# Patient Record
Sex: Male | Born: 1995 | Race: Black or African American | Hispanic: No | Marital: Single | Smoking: Former smoker
Health system: Southern US, Community
[De-identification: ages and names within clinical notes are randomized; demographics above are authoritative.]

## PROBLEM LIST (undated history)

## (undated) DIAGNOSIS — F209 Schizophrenia, unspecified: Secondary | ICD-10-CM

## (undated) DIAGNOSIS — T8859XA Other complications of anesthesia, initial encounter: Secondary | ICD-10-CM

## (undated) DIAGNOSIS — R748 Abnormal levels of other serum enzymes: Secondary | ICD-10-CM

## (undated) HISTORY — DX: Abnormal levels of other serum enzymes: R74.8

---

## 2019-06-03 ENCOUNTER — Telehealth: Payer: Self-pay | Admitting: *Deleted

## 2019-06-03 ENCOUNTER — Encounter: Payer: Self-pay | Admitting: Emergency Medicine

## 2019-06-03 ENCOUNTER — Ambulatory Visit (INDEPENDENT_AMBULATORY_CARE_PROVIDER_SITE_OTHER): Payer: 59 | Admitting: Emergency Medicine

## 2019-06-03 ENCOUNTER — Other Ambulatory Visit: Payer: Self-pay

## 2019-06-03 VITALS — BP 112/80 | HR 97 | Temp 97.5°F | Resp 16 | Ht 68.0 in | Wt 304.0 lb

## 2019-06-03 DIAGNOSIS — Z1321 Encounter for screening for nutritional disorder: Secondary | ICD-10-CM

## 2019-06-03 DIAGNOSIS — Z130 Encounter for screening for diseases of the blood and blood-forming organs and certain disorders involving the immune mechanism: Secondary | ICD-10-CM | POA: Diagnosis not present

## 2019-06-03 DIAGNOSIS — G4700 Insomnia, unspecified: Secondary | ICD-10-CM

## 2019-06-03 DIAGNOSIS — Z8739 Personal history of other diseases of the musculoskeletal system and connective tissue: Secondary | ICD-10-CM | POA: Diagnosis not present

## 2019-06-03 DIAGNOSIS — Z8659 Personal history of other mental and behavioral disorders: Secondary | ICD-10-CM | POA: Diagnosis not present

## 2019-06-03 DIAGNOSIS — Z1329 Encounter for screening for other suspected endocrine disorder: Secondary | ICD-10-CM

## 2019-06-03 DIAGNOSIS — Z13220 Encounter for screening for lipoid disorders: Secondary | ICD-10-CM

## 2019-06-03 DIAGNOSIS — Z13228 Encounter for screening for other metabolic disorders: Secondary | ICD-10-CM

## 2019-06-03 DIAGNOSIS — Z7689 Persons encountering health services in other specified circumstances: Secondary | ICD-10-CM

## 2019-06-03 NOTE — Telephone Encounter (Signed)
Called patient to return to the office for labs, he left without getting his blood drawn.

## 2019-06-03 NOTE — Progress Notes (Signed)
Noah Ramsey 24 y.o.   Chief Complaint  Patient presents with  . Establish Care    per patient he has high CPK levels    HISTORY OF PRESENT ILLNESS: This is a 24 y.o. male first visit to this office, here to establish care with me. Patient has a history of psychosis and takes haloperidol injection 100 mg every 28 days.  Sees psychiatrist on a regular basis. 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 No complaints or medical concerns today.  HPI   Prior to Admission medications   Medication Sig Start Date End Date Taking? Authorizing Provider  haloperidol decanoate (HALDOL DECANOATE) 100 MG/ML injection Inject 100 mg into the muscle every 28 (twenty-eight) days.   Yes [provider]    No Known Allergies  There are no problems to display for this patient.   History reviewed. No pertinent past medical history.  History reviewed. No pertinent surgical history.  Social History   Socioeconomic History  . Marital status: Single    Spouse name: Not on file  . Number of children: Not on file  . Years of education: Not on file  . Highest education level: Not on file  Occupational History  . Not on file  Tobacco Use  . Smoking status: Never Smoker  . Smokeless tobacco: Never Used  Substance and Sexual Activity  . Alcohol use: Never  . Drug use: Never  . Sexual activity: Not on file  Other Topics Concern  . Not on file  Social History Narrative  . Not on file   Social Determinants of Health   Financial Resource Strain:   . Difficulty of Paying Living Expenses: Not on file  Food Insecurity:   . Worried About Charity fundraiser in the Last Year: Not on file  . Ran Out of Food in the Last Year: Not on file  Transportation Needs:   . Lack of Transportation (Medical): Not on file  . Lack of Transportation (Non-Medical): Not on file  Physical Activity:   . Days of Exercise per Week: Not on file    . Minutes of Exercise per Session: Not on file  Stress:   . Feeling of Stress : Not on file  Social Connections:   . Frequency of Communication with Friends and Family: Not on file  . Frequency of Social Gatherings with Friends and Family: Not on file  . Attends Religious Services: Not on file  . Active Member of Clubs or Organizations: Not on file  . Attends Archivist Meetings: Not on file  . Marital Status: Not on file  Intimate Partner Violence:   . Fear of Current or Ex-Partner: Not on file  . Emotionally Abused: Not on file  . Physically Abused: Not on file  . Sexually Abused: Not on file    History reviewed. No pertinent family history.   Review of Systems  Constitutional: Negative.  Negative for chills and fever.  HENT: Negative.  Negative for congestion and sore throat.   Eyes: Negative.   Respiratory: Negative.  Negative for shortness of breath.   Cardiovascular: Negative.  Negative for chest pain and palpitations.  Gastrointestinal: Negative.  Negative for abdominal pain, blood in stool, diarrhea, melena, nausea and vomiting.  Genitourinary: Negative.  Negative for dysuria and hematuria.  Musculoskeletal: Negative.  Negative for back pain, myalgias and neck pain.  Skin: Negative.  Negative for rash.  Neurological: Negative.  Negative for dizziness  and headaches.  Psychiatric/Behavioral: The patient has insomnia.   All other systems reviewed and are negative.   Today's Vitals   06/03/19 1318  BP: 112/80  Pulse: 97  Resp: 16  Temp: (!) 97.5 F (36.4 C)  TempSrc: Temporal  SpO2: 97%  Weight: (!) 304 lb (137.9 kg)  Height: 5\' 8"  (1.727 m)   Body mass index is 46.22 kg/m.  Physical Exam Constitutional:      Appearance: He is obese.  HENT:     Head: Normocephalic.     Nose: Nose normal.     Mouth/Throat:     Mouth: Mucous membranes are moist.     Pharynx: Oropharynx is clear.  Eyes:     Extraocular Movements: Extraocular movements intact.      Pupils: Pupils are equal, round, and reactive to light.  Cardiovascular:     Rate and Rhythm: Normal rate and regular rhythm.     Pulses: Normal pulses.     Heart sounds: Normal heart sounds.  Pulmonary:     Effort: Pulmonary effort is normal.     Breath sounds: Normal breath sounds.  Abdominal:     General: Bowel sounds are normal. There is no distension.     Palpations: Abdomen is soft.     Tenderness: There is no abdominal tenderness.  Musculoskeletal:        General: Normal range of motion.     Cervical back: Normal range of motion and neck supple.  Skin:    General: Skin is warm and dry.     Capillary Refill: Capillary refill takes less than 2 seconds.  Neurological:     General: No focal deficit present.     Mental Status: He is alert and oriented to person, place, and time.  Psychiatric:     Comments: Flat affect      ASSESSMENT & PLAN: Axzel was seen today for establish care.  Diagnoses and all orders for this visit:  Encounter to establish care  History of rhabdomyolysis -     Comprehensive metabolic panel -     CK  History of psychosis  Insomnia, unspecified type  Screening for deficiency anemia -     CBC with Differential/Platelet  Screening for endocrine, nutritional, metabolic and immunity disorder -     Comprehensive metabolic panel -     TSH  Screening for lipoid disorders -     Lipid panel    Patient Instructions       If you have lab work done today you will be contacted with your lab results within the next 2 weeks.  If you have not heard from [facility] then please contact us. The fastest way to get your results is to register for My Chart.   IF you received an x-ray today, you will receive an invoice from [facility]. Please contact [facility] at [phone] with questions or concerns regarding your invoice.   IF you received labwork today, you will receive an invoice from [facility]. Please contact [facility] at  [phone] with questions or concerns regarding your invoice.   Our billing staff will not be able to assist you with questions regarding bills from these companies.  You will be contacted with the lab results as soon as they are available. The fastest way to get your results is to activate your My Chart account. Instructions are located on the last page of this paperwork. If you have not heard from [phone] regarding the results in 2 weeks, please contact  this office.     Health Maintenance, Male Adopting a healthy lifestyle and getting preventive care are important in promoting health and wellness. Ask your health care provider about:  The right schedule for you to have regular tests and exams.  Things you can do on your own to prevent diseases and keep yourself healthy. What should I know about diet, weight, and exercise? Eat a healthy diet   Eat a diet that includes plenty of vegetables, fruits, low-fat dairy products, and lean protein.  Do not eat a lot of foods that are high in solid fats, added sugars, or sodium. Maintain a healthy weight Body mass index (BMI) is a measurement that can be used to identify possible weight problems. It estimates body fat based on height and weight. Your health care provider can help determine your BMI and help you achieve or maintain a healthy weight. Get regular exercise Get regular exercise. This is one of the most important things you can do for your health. Most adults should:  Exercise for at least 150 minutes each week. The exercise should increase your heart rate and make you sweat (moderate-intensity exercise).  Do strengthening exercises at least twice a week. This is in addition to the moderate-intensity exercise.  Spend less time sitting. Even light physical activity can be beneficial. Watch cholesterol and blood lipids Have your blood tested for lipids and cholesterol at 24 years of age, then have this test every 5 years. You may need  to have your cholesterol levels checked more often if:  Your lipid or cholesterol levels are high.  You are older than 24 years of age.  You are at high risk for heart disease. What should I know about cancer screening? Many types of cancers can be detected early and may often be prevented. Depending on your health history and family history, you may need to have cancer screening at various ages. This may include screening for:  Colorectal cancer.  Prostate cancer.  Skin cancer.  Lung cancer. What should I know about heart disease, diabetes, and high blood pressure? Blood pressure and heart disease  High blood pressure causes heart disease and increases the risk of stroke. This is more likely to develop in people who have high blood pressure readings, are of African descent, or are overweight.  Talk with your health care provider about your target blood pressure readings.  Have your blood pressure checked: ? Every 3-5 years if you are 21-56 years of age. ? Every year if you are 14 years old or older.  If you are between the ages of 35 and 4 and are a current or former smoker, ask your health care provider if you should have a one-time screening for abdominal aortic aneurysm (AAA). Diabetes Have regular diabetes screenings. This checks your fasting blood sugar level. Have the screening done:  Once every three years after age 38 if you are at a normal weight and have a low risk for diabetes.  More often and at a younger age if you are overweight or have a high risk for diabetes. What should I know about preventing infection? Hepatitis B If you have a higher risk for hepatitis B, you should be screened for this virus. Talk with your health care provider to find out if you are at risk for hepatitis B infection. Hepatitis C Blood testing is recommended for:  Everyone born from [birth year] through 1965.  Anyone with known risk factors for hepatitis C. Sexually transmitted infections  (STIs)  You should be screened each year for STIs, including gonorrhea and chlamydia, if: ? You are sexually active and are younger than 24 years of age. ? You are older than 24 years of age and your health care provider tells you that you are at risk for this type of infection. ? Your sexual activity has changed since you were last screened, and you are at increased risk for chlamydia or gonorrhea. Ask your health care provider if you are at risk.  Ask your health care provider about whether you are at high risk for HIV. Your health care provider may recommend a prescription medicine to help prevent HIV infection. If you choose to take medicine to prevent HIV, you should first get tested for HIV. You should then be tested every 3 months for as long as you are taking the medicine. Follow these instructions at home: Lifestyle  Do not use any products that contain nicotine or tobacco, such as cigarettes, e-cigarettes, and chewing tobacco. If you need help quitting, ask your health care provider.  Do not use street drugs.  Do not share needles.  Ask your health care provider for help if you need support or information about quitting drugs. Alcohol use  Do not drink alcohol if your health care provider tells you not to drink.  If you drink alcohol: ? Limit how much you have to 0-2 drinks a day. ? Be aware of how much alcohol is in your drink. In the [place] one drink equals one 12 oz bottle of beer (355 mL), one 5 oz glass of wine (148 mL), or one 1 oz glass of hard liquor (44 mL). General instructions  Schedule regular health, dental, and eye exams.  Stay current with your vaccines.  Tell your health care provider if: ? You often feel depressed. ? You have ever been abused or do not feel safe at home. Summary  Adopting a healthy lifestyle and getting preventive care are important in promoting health and wellness.  Follow your health care provider's instructions about healthy diet,  exercising, and getting tested or screened for diseases.  Follow your health care provider's instructions on monitoring your cholesterol and blood pressure. This information is not intended to replace advice given to you by your health care provider. Make sure you discuss any questions you have with your health care provider. Document Revised: 04/01/2018 Document Reviewed: 04/01/2018 Elsevier Patient Education  2020 Elsevier Inc.      Edwina Barth, MD [facility]

## 2019-06-03 NOTE — Patient Instructions (Addendum)
   If you have lab work done today you will be contacted with your lab results within the next 2 weeks.  If you have not heard from us then please contact us. The fastest way to get your results is to register for My Chart.   IF you received an x-ray today, you will receive an invoice from [facility]. Please contact [facility] at [phone] with questions or concerns regarding your invoice.   IF you received labwork today, you will receive an invoice from [facility]. Please contact [facility] at [phone] with questions or concerns regarding your invoice.   Our billing staff will not be able to assist you with questions regarding bills from these companies.  You will be contacted with the lab results as soon as they are available. The fastest way to get your results is to activate your My Chart account. Instructions are located on the last page of this paperwork. If you have not heard from us regarding the results in 2 weeks, please contact this office.      Health Maintenance, Male Adopting a healthy lifestyle and getting preventive care are important in promoting health and wellness. Ask your health care provider about:  The right schedule for you to have regular tests and exams.  Things you can do on your own to prevent diseases and keep yourself healthy. What should I know about diet, weight, and exercise? Eat a healthy diet   Eat a diet that includes plenty of vegetables, fruits, low-fat dairy products, and lean protein.  Do not eat a lot of foods that are high in solid fats, added sugars, or sodium. Maintain a healthy weight Body mass index (BMI) is a measurement that can be used to identify possible weight problems. It estimates body fat based on height and weight. Your health care provider can help determine your BMI and help you achieve or maintain a healthy weight. Get regular exercise Get regular exercise. This is one of the most important things you  can do for your health. Most adults should:  Exercise for at least 150 minutes each week. The exercise should increase your heart rate and make you sweat (moderate-intensity exercise).  Do strengthening exercises at least twice a week. This is in addition to the moderate-intensity exercise.  Spend less time sitting. Even light physical activity can be beneficial. Watch cholesterol and blood lipids Have your blood tested for lipids and cholesterol at 24 years of age, then have this test every 5 years. You may need to have your cholesterol levels checked more often if:  Your lipid or cholesterol levels are high.  You are older than 24 years of age.  You are at high risk for heart disease. What should I know about cancer screening? Many types of cancers can be detected early and may often be prevented. Depending on your health history and family history, you may need to have cancer screening at various ages. This may include screening for:  Colorectal cancer.  Prostate cancer.  Skin cancer.  Lung cancer. What should I know about heart disease, diabetes, and high blood pressure? Blood pressure and heart disease  High blood pressure causes heart disease and increases the risk of stroke. This is more likely to develop in people who have high blood pressure readings, are of African descent, or are overweight.  Talk with your health care provider about your target blood pressure readings.  Have your blood pressure checked: ? Every 3-5 years if you are 18-39   years of age. ? Every year if you are 40 years old or older.  If you are between the ages of 65 and 75 and are a current or former smoker, ask your health care provider if you should have a one-time screening for abdominal aortic aneurysm (AAA). Diabetes Have regular diabetes screenings. This checks your fasting blood sugar level. Have the screening done:  Once every three years after age 45 if you are at a normal weight and have  a low risk for diabetes.  More often and at a younger age if you are overweight or have a high risk for diabetes. What should I know about preventing infection? Hepatitis B If you have a higher risk for hepatitis B, you should be screened for this virus. Talk with your health care provider to find out if you are at risk for hepatitis B infection. Hepatitis C Blood testing is recommended for:  Everyone born from 1945 through 1965.  Anyone with known risk factors for hepatitis C. Sexually transmitted infections (STIs)  You should be screened each year for STIs, including gonorrhea and chlamydia, if: ? You are sexually active and are younger than 24 years of age. ? You are older than 24 years of age and your health care provider tells you that you are at risk for this type of infection. ? Your sexual activity has changed since you were last screened, and you are at increased risk for chlamydia or gonorrhea. Ask your health care provider if you are at risk.  Ask your health care provider about whether you are at high risk for HIV. Your health care provider may recommend a prescription medicine to help prevent HIV infection. If you choose to take medicine to prevent HIV, you should first get tested for HIV. You should then be tested every 3 months for as long as you are taking the medicine. Follow these instructions at home: Lifestyle  Do not use any products that contain nicotine or tobacco, such as cigarettes, e-cigarettes, and chewing tobacco. If you need help quitting, ask your health care provider.  Do not use street drugs.  Do not share needles.  Ask your health care provider for help if you need support or information about quitting drugs. Alcohol use  Do not drink alcohol if your health care provider tells you not to drink.  If you drink alcohol: ? Limit how much you have to 0-2 drinks a day. ? Be aware of how much alcohol is in your drink. In the [place] one drink equals one 12  oz bottle of beer (355 mL), one 5 oz glass of wine (148 mL), or one 1 oz glass of hard liquor (44 mL). General instructions  Schedule regular health, dental, and eye exams.  Stay current with your vaccines.  Tell your health care provider if: ? You often feel depressed. ? You have ever been abused or do not feel safe at home. Summary  Adopting a healthy lifestyle and getting preventive care are important in promoting health and wellness.  Follow your health care provider's instructions about healthy diet, exercising, and getting tested or screened for diseases.  Follow your health care provider's instructions on monitoring your cholesterol and blood pressure. This information is not intended to replace advice given to you by your health care provider. Make sure you discuss any questions you have with your health care provider. Document Revised: 04/01/2018 Document Reviewed: 04/01/2018 Elsevier Patient Education  2020 Elsevier Inc.  

## 2019-06-04 LAB — COMPREHENSIVE METABOLIC PANEL WITH GFR
ALT: 30 [IU]/L (ref 0–44)
AST: 30 [IU]/L (ref 0–40)
Albumin/Globulin Ratio: 1.3 (ref 1.2–2.2)
Albumin: 4.3 g/dL (ref 4.1–5.2)
Alkaline Phosphatase: 69 [IU]/L (ref 39–117)
BUN/Creatinine Ratio: 9 (ref 9–20)
BUN: 9 mg/dL (ref 6–20)
Bilirubin Total: 0.5 mg/dL (ref 0.0–1.2)
CO2: 21 mmol/L (ref 20–29)
Calcium: 9.4 mg/dL (ref 8.7–10.2)
Chloride: 101 mmol/L (ref 96–106)
Creatinine, Ser: 1.02 mg/dL (ref 0.76–1.27)
GFR calc Af Amer: 119 mL/min/{1.73_m2}
GFR calc non Af Amer: 103 mL/min/{1.73_m2}
Globulin, Total: 3.4 g/dL (ref 1.5–4.5)
Glucose: 81 mg/dL (ref 65–99)
Potassium: 4.1 mmol/L (ref 3.5–5.2)
Sodium: 141 mmol/L (ref 134–144)
Total Protein: 7.7 g/dL (ref 6.0–8.5)

## 2019-06-04 LAB — CBC WITH DIFFERENTIAL/PLATELET
Basophils Absolute: 0 10*3/uL (ref 0.0–0.2)
Basos: 0 %
EOS (ABSOLUTE): 0.1 10*3/uL (ref 0.0–0.4)
Eos: 2 %
Hematocrit: 41.9 % (ref 37.5–51.0)
Hemoglobin: 14.6 g/dL (ref 13.0–17.7)
Immature Grans (Abs): 0 10*3/uL (ref 0.0–0.1)
Immature Granulocytes: 0 %
Lymphocytes Absolute: 2.5 10*3/uL (ref 0.7–3.1)
Lymphs: 55 %
MCH: 32.1 pg (ref 26.6–33.0)
MCHC: 34.8 g/dL (ref 31.5–35.7)
MCV: 92 fL (ref 79–97)
Monocytes Absolute: 0.4 10*3/uL (ref 0.1–0.9)
Monocytes: 9 %
Neutrophils Absolute: 1.5 10*3/uL (ref 1.4–7.0)
Neutrophils: 34 %
Platelets: 197 10*3/uL (ref 150–450)
RBC: 4.55 x10E6/uL (ref 4.14–5.80)
RDW: 13.5 % (ref 11.6–15.4)
WBC: 4.6 10*3/uL (ref 3.4–10.8)

## 2019-06-04 LAB — LIPID PANEL
Chol/HDL Ratio: 3.9 ratio (ref 0.0–5.0)
Cholesterol, Total: 159 mg/dL (ref 100–199)
HDL: 41 mg/dL (ref >39)
LDL Chol Calc (NIH): 101 mg/dL — ABNORMAL HIGH (ref 0–99)
Triglycerides: 90 mg/dL (ref 0–149)
VLDL Cholesterol Cal: 17 mg/dL (ref 5–40)

## 2019-06-04 LAB — TSH: TSH: 4.47 u[IU]/mL (ref 0.450–4.500)

## 2019-06-04 LAB — CK: Total CK: 676 U/L (ref 49–439)

## 2020-01-18 ENCOUNTER — Ambulatory Visit: Payer: 59 | Admitting: Emergency Medicine

## 2020-01-18 ENCOUNTER — Encounter: Payer: Self-pay | Admitting: Emergency Medicine

## 2020-01-18 ENCOUNTER — Other Ambulatory Visit: Payer: Self-pay

## 2020-01-18 VITALS — BP 113/76 | HR 97 | Temp 97.6°F | Resp 16 | Ht 67.0 in | Wt 316.0 lb

## 2020-01-18 DIAGNOSIS — Z8739 Personal history of other diseases of the musculoskeletal system and connective tissue: Secondary | ICD-10-CM | POA: Diagnosis not present

## 2020-01-18 DIAGNOSIS — R635 Abnormal weight gain: Secondary | ICD-10-CM | POA: Diagnosis not present

## 2020-01-18 DIAGNOSIS — Z6841 Body Mass Index (BMI) 40.0 and over, adult: Secondary | ICD-10-CM | POA: Insufficient documentation

## 2020-01-18 DIAGNOSIS — Z8659 Personal history of other mental and behavioral disorders: Secondary | ICD-10-CM

## 2020-01-18 MED ORDER — METFORMIN HCL 500 MG PO TABS: 500.0000 mg | ORAL_TABLET | Freq: Two times a day (BID) | ORAL | 3 refills | Status: DC

## 2020-01-18 NOTE — Addendum Note (Signed)
Addended by: Evie Lacks on: 01/18/2020 05:03 PM   Modules accepted: Orders

## 2020-01-18 NOTE — Progress Notes (Signed)
Noah Ramsey 24 y.o.   Chief Complaint  Patient presents with  . CPK level    patient wants to check CPK and chronic medical conditions    HISTORY OF PRESENT ILLNESS: This is a 24 y.o. male here for follow-up on his chronic medical problems. 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 Has history of rhabdomyolysis with increased CPK.  No history of chronic renal failure. Concerned about his weight.  Inquiring about medication to lose weight.  Needs referral to nutritional services. No other complaints or medical concerns today.  HPI   Prior to Admission medications   Medication Sig Start Date End Date Taking? Authorizing Provider  haloperidol (HALDOL) 10 MG tablet Take 10 mg by mouth at bedtime.   Yes [provider]  haloperidol decanoate (HALDOL DECANOATE) 100 MG/ML injection Inject 100 mg into the muscle every 28 (twenty-eight) days. Patient not taking: Reported on 01/18/2020    [provider]    No Known Allergies  There are no problems to display for this patient.   Past Medical History:  Diagnosis Date  . Elevated CPK    per patient    No past surgical history on file.  Social History   Socioeconomic History  . Marital status: Single    Spouse name: Not on file  . Number of children: Not on file  . Years of education: Not on file  . Highest education level: Not on file  Occupational History  . Not on file  Tobacco Use  . Smoking status: Never Smoker  . Smokeless tobacco: Never Used  Substance and Sexual Activity  . Alcohol use: Never  . Drug use: Never  . Sexual activity: Not on file  Other Topics Concern  . Not on file  Social History Narrative  . Not on file   Social Determinants of Health   Financial Resource Strain:   . Difficulty of Paying Living Expenses: Not on file  Food Insecurity:   . Worried About Programme researcher, broadcasting/film/videounning Out of Food in the Last Year: Not on file  . Ran Out of Food in the Last Year: Not on file    Transportation Needs:   . Lack of Transportation (Medical): Not on file  . Lack of Transportation (Non-Medical): Not on file  Physical Activity:   . Days of Exercise per Week: Not on file  . Minutes of Exercise per Session: Not on file  Stress:   . Feeling of Stress : Not on file  Social Connections:   . Frequency of Communication with Friends and Family: Not on file  . Frequency of Social Gatherings with Friends and Family: Not on file  . Attends Religious Services: Not on file  . Active Member of Clubs or Organizations: Not on file  . Attends BankerClub or Organization Meetings: Not on file  . Marital Status: Not on file  Intimate Partner Violence:   . Fear of Current or Ex-Partner: Not on file  . Emotionally Abused: Not on file  . Physically Abused: Not on file  . Sexually Abused: Not on file    Family History  Problem Relation Age of Onset  . Mental illness Brother      Review of Systems  Constitutional: Negative.  Negative for chills and fever.  HENT: Negative.  Negative for congestion and sore throat.   Respiratory: Negative.  Negative for cough and shortness of breath.   Cardiovascular: Negative.  Negative for chest pain and palpitations.  Gastrointestinal: Negative for abdominal  pain, nausea and vomiting.  Genitourinary: Negative.  Negative for dysuria.  Skin: Negative.  Negative for rash.  Neurological: Negative.  Negative for dizziness and headaches.  All other systems reviewed and are negative.  Today's Vitals   01/18/20 1622  BP: 113/76  Pulse: 97  Resp: 16  Temp: 97.6 F (36.4 C)  TempSrc: Temporal  SpO2: 96%  Weight: (!) 316 lb (143.3 kg)  Height: 5\' 7"  (1.702 m)   Body mass index is 49.49 kg/m.   Physical Exam Vitals reviewed.  Constitutional:      Appearance: He is obese.  HENT:     Head: Normocephalic.  Eyes:     Extraocular Movements: Extraocular movements intact.     Pupils: Pupils are equal, round, and reactive to light.  Cardiovascular:      Rate and Rhythm: Normal rate and regular rhythm.     Pulses: Normal pulses.     Heart sounds: Normal heart sounds.  Pulmonary:     Effort: Pulmonary effort is normal.     Breath sounds: Normal breath sounds.  Musculoskeletal:        General: Normal range of motion.     Cervical back: Normal range of motion and neck supple.  Skin:    General: Skin is warm and dry.  Neurological:     General: No focal deficit present.     Mental Status: He is alert and oriented to person, place, and time.  Psychiatric:        Mood and Affect: Mood normal.        Behavior: Behavior normal.      ASSESSMENT & PLAN: Noah Ramsey was seen today for cpk level.  Diagnoses and all orders for this visit:  Body mass index (BMI) of 45.0-49.9 in adult (HCC) -     Hemoglobin A1c; Future -     Amb ref to Medical Nutrition Therapy-MNT -     metFORMIN (GLUCOPHAGE) 500 MG tablet; Take 1 tablet (500 mg total) by mouth 2 (two) times daily with a meal.  History of rhabdomyolysis -     Comprehensive metabolic panel -     CK  History of psychosis    Patient Instructions       If you have lab work done today you will be contacted with your lab results within the next 2 weeks.  If you have not heard from [id number] then please contact us. The fastest way to get your results is to register for My Chart.   IF you received an x-ray today, you will receive an invoice from [facility]. Please contact [facility] at [phone] with questions or concerns regarding your invoice.   IF you received labwork today, you will receive an invoice from [facility]. Please contact [facility] at [phone] with questions or concerns regarding your invoice.   Our billing staff will not be able to assist you with questions regarding bills from these companies.  You will be contacted with the lab results as soon as they are available. The fastest way to get your results is to activate your My Chart account.  Instructions are located on the last page of this paperwork. If you have not heard from [phone] regarding the results in 2 weeks, please contact this office.     Calorie Counting for Weight Loss Calories are units of energy. Your body needs a certain amount of calories from food to keep you going throughout the day. When you eat more calories than your body needs, your  body stores the extra calories as fat. When you eat fewer calories than your body needs, your body burns fat to get the energy it needs. Calorie counting means keeping track of how many calories you eat and drink each day. Calorie counting can be helpful if you need to lose weight. If you make sure to eat fewer calories than your body needs, you should lose weight. Ask your health care provider what a healthy weight is for you. For calorie counting to work, you will need to eat the right number of calories in a day in order to lose a healthy amount of weight per week. A dietitian can help you determine how many calories you need in a day and will give you suggestions on how to reach your calorie goal.  A healthy amount of weight to lose per week is usually 1-2 lb (0.5-0.9 kg). This usually means that your daily calorie intake should be reduced by 500-750 calories.  Eating 1,200 - 1,500 calories per day can help most women lose weight.  Eating 1,500 - 1,800 calories per day can help most men lose weight. What is my plan? My goal is to have __________ calories per day. If I have this many calories per day, I should lose around __________ pounds per week. What do I need to know about calorie counting? In order to meet your daily calorie goal, you will need to:  Find out how many calories are in each food you would like to eat. Try to do this before you eat.  Decide how much of the food you plan to eat.  Write down what you ate and how many calories it had. Doing this is called keeping a food log. To successfully lose weight, it is  important to balance calorie counting with a healthy lifestyle that includes regular activity. Aim for 150 minutes of moderate exercise (such as walking) or 75 minutes of vigorous exercise (such as running) each week. Where do I find calorie information?  The number of calories in a food can be found on a Nutrition Facts label. If a food does not have a Nutrition Facts label, try to look up the calories online or ask your dietitian for help. Remember that calories are listed per serving. If you choose to have more than one serving of a food, you will have to multiply the calories per serving by the amount of servings you plan to eat. For example, the label on a package of bread might say that a serving size is 1 slice and that there are 90 calories in a serving. If you eat 1 slice, you will have eaten 90 calories. If you eat 2 slices, you will have eaten 180 calories. How do I keep a food log? Immediately after each meal, record the following information in your food log:  What you ate. Don't forget to include toppings, sauces, and other extras on the food.  How much you ate. This can be measured in cups, ounces, or number of items.  How many calories each food and drink had.  The total number of calories in the meal. Keep your food log near you, such as in a small notebook in your pocket, or use a mobile app or website. Some programs will calculate calories for you and show you how many calories you have left for the day to meet your goal. What are some calorie counting tips?   Use your calories on foods and drinks that will fill  you up and not leave you hungry: ? Some examples of foods that fill you up are nuts and nut butters, vegetables, lean proteins, and high-fiber foods like whole grains. High-fiber foods are foods with more than 5 g fiber per serving. ? Drinks such as sodas, specialty coffee drinks, alcohol, and juices have a lot of calories, yet do not fill you up.  Eat nutritious  foods and avoid empty calories. Empty calories are calories you get from foods or beverages that do not have many vitamins or protein, such as candy, sweets, and soda. It is better to have a nutritious high-calorie food (such as an avocado) than a food with few nutrients (such as a bag of chips).  Know how many calories are in the foods you eat most often. This will help you calculate calorie counts faster.  Pay attention to calories in drinks. Low-calorie drinks include water and unsweetened drinks.  Pay attention to nutrition labels for "low fat" or "fat free" foods. These foods sometimes have the same amount of calories or more calories than the full fat versions. They also often have added sugar, starch, or salt, to make up for flavor that was removed with the fat.  Find a way of tracking calories that works for you. Get creative. Try different apps or programs if writing down calories does not work for you. What are some portion control tips?  Know how many calories are in a serving. This will help you know how many servings of a certain food you can have.  Use a measuring cup to measure serving sizes. You could also try weighing out portions on a kitchen scale. With time, you will be able to estimate serving sizes for some foods.  Take some time to put servings of different foods on your favorite plates, bowls, and cups so you know what a serving looks like.  Try not to eat straight from a bag or box. Doing this can lead to overeating. Put the amount you would like to eat in a cup or on a plate to make sure you are eating the right portion.  Use smaller plates, glasses, and bowls to prevent overeating.  Try not to multitask (for example, watch TV or use your computer) while eating. If it is time to eat, sit down at a table and enjoy your food. This will help you to know when you are full. It will also help you to be aware of what you are eating and how much you are eating. What are tips  for following this plan? Reading food labels  Check the calorie count compared to the serving size. The serving size may be smaller than what you are used to eating.  Check the source of the calories. Make sure the food you are eating is high in vitamins and protein and low in saturated and trans fats. Shopping  Read nutrition labels while you shop. This will help you make healthy decisions before you decide to purchase your food.  Make a grocery list and stick to it. Cooking  Try to cook your favorite foods in a healthier way. For example, try baking instead of frying.  Use low-fat dairy products. Meal planning  Use more fruits and vegetables. Half of your plate should be fruits and vegetables.  Include lean proteins like poultry and fish. How do I count calories when eating out?  Ask for smaller portion sizes.  Consider sharing an entree and sides instead of getting your own entree.  If you get your own entree, eat only half. Ask for a box at the beginning of your meal and put the rest of your entree in it so you are not tempted to eat it.  If calories are listed on the menu, choose the lower calorie options.  Choose dishes that include vegetables, fruits, whole grains, low-fat dairy products, and lean protein.  Choose items that are boiled, broiled, grilled, or steamed. Stay away from items that are buttered, battered, fried, or served with cream sauce. Items labeled "crispy" are usually fried, unless stated otherwise.  Choose water, low-fat milk, unsweetened iced tea, or other drinks without added sugar. If you want an alcoholic beverage, choose a lower calorie option such as a glass of wine or light beer.  Ask for dressings, sauces, and syrups on the side. These are usually high in calories, so you should limit the amount you eat.  If you want a salad, choose a garden salad and ask for grilled meats. Avoid extra toppings like bacon, cheese, or fried items. Ask for the  dressing on the side, or ask for olive oil and vinegar or lemon to use as dressing.  Estimate how many servings of a food you are given. For example, a serving of cooked rice is  cup or about the size of half a baseball. Knowing serving sizes will help you be aware of how much food you are eating at restaurants. The list below tells you how big or small some common portion sizes are based on everyday objects: ? 1 oz--4 stacked dice. ? 3 oz--1 deck of cards. ? 1 tsp--1 die. ? 1 Tbsp-- a ping-pong ball. ? 2 Tbsp--1 ping-pong ball. ?  cup-- baseball. ? 1 cup--1 baseball. Summary  Calorie counting means keeping track of how many calories you eat and drink each day. If you eat fewer calories than your body needs, you should lose weight.  A healthy amount of weight to lose per week is usually 1-2 lb (0.5-0.9 kg). This usually means reducing your daily calorie intake by 500-750 calories.  The number of calories in a food can be found on a Nutrition Facts label. If a food does not have a Nutrition Facts label, try to look up the calories online or ask your dietitian for help.  Use your calories on foods and drinks that will fill you up, and not on foods and drinks that will leave you hungry.  Use smaller plates, glasses, and bowls to prevent overeating. This information is not intended to replace advice given to you by your health care provider. Make sure you discuss any questions you have with your health care provider. Document Revised: 12/26/2017 Document Reviewed: 03/08/2016 Elsevier Patient Education  2020 [employer]      Edwina Barth, MD [facility]

## 2020-01-18 NOTE — Patient Instructions (Addendum)
   If you have lab work done today you will be contacted with your lab results within the next 2 weeks.  If you have not heard from us then please contact us. The fastest way to get your results is to register for My Chart.   IF you received an x-ray today, you will receive an invoice from [facility]. Please contact [facility] at [phone] with questions or concerns regarding your invoice.   IF you received labwork today, you will receive an invoice from [facility]. Please contact [facility] at [phone] with questions or concerns regarding your invoice.   Our billing staff will not be able to assist you with questions regarding bills from these companies.  You will be contacted with the lab results as soon as they are available. The fastest way to get your results is to activate your My Chart account. Instructions are located on the last page of this paperwork. If you have not heard from us regarding the results in 2 weeks, please contact this office.      Calorie Counting for Weight Loss Calories are units of energy. Your body needs a certain amount of calories from food to keep you going throughout the day. When you eat more calories than your body needs, your body stores the extra calories as fat. When you eat fewer calories than your body needs, your body burns fat to get the energy it needs. Calorie counting means keeping track of how many calories you eat and drink each day. Calorie counting can be helpful if you need to lose weight. If you make sure to eat fewer calories than your body needs, you should lose weight. Ask your health care provider what a healthy weight is for you. For calorie counting to work, you will need to eat the right number of calories in a day in order to lose a healthy amount of weight per week. A dietitian can help you determine how many calories you need in a day and will give you suggestions on how to reach your calorie goal.  A healthy  amount of weight to lose per week is usually 1-2 lb (0.5-0.9 kg). This usually means that your daily calorie intake should be reduced by 500-750 calories.  Eating 1,200 - 1,500 calories per day can help most women lose weight.  Eating 1,500 - 1,800 calories per day can help most men lose weight. What is my plan? My goal is to have __________ calories per day. If I have this many calories per day, I should lose around __________ pounds per week. What do I need to know about calorie counting? In order to meet your daily calorie goal, you will need to:  Find out how many calories are in each food you would like to eat. Try to do this before you eat.  Decide how much of the food you plan to eat.  Write down what you ate and how many calories it had. Doing this is called keeping a food log. To successfully lose weight, it is important to balance calorie counting with a healthy lifestyle that includes regular activity. Aim for 150 minutes of moderate exercise (such as walking) or 75 minutes of vigorous exercise (such as running) each week. Where do I find calorie information?  The number of calories in a food can be found on a Nutrition Facts label. If a food does not have a Nutrition Facts label, try to look up the calories online or ask your dietitian   for help. Remember that calories are listed per serving. If you choose to have more than one serving of a food, you will have to multiply the calories per serving by the amount of servings you plan to eat. For example, the label on a package of bread might say that a serving size is 1 slice and that there are 90 calories in a serving. If you eat 1 slice, you will have eaten 90 calories. If you eat 2 slices, you will have eaten 180 calories. How do I keep a food log? Immediately after each meal, record the following information in your food log:  What you ate. Don't forget to include toppings, sauces, and other extras on the food.  How much you  ate. This can be measured in cups, ounces, or number of items.  How many calories each food and drink had.  The total number of calories in the meal. Keep your food log near you, such as in a small notebook in your pocket, or use a mobile app or website. Some programs will calculate calories for you and show you how many calories you have left for the day to meet your goal. What are some calorie counting tips?   Use your calories on foods and drinks that will fill you up and not leave you hungry: ? Some examples of foods that fill you up are nuts and nut butters, vegetables, lean proteins, and high-fiber foods like whole grains. High-fiber foods are foods with more than 5 g fiber per serving. ? Drinks such as sodas, specialty coffee drinks, alcohol, and juices have a lot of calories, yet do not fill you up.  Eat nutritious foods and avoid empty calories. Empty calories are calories you get from foods or beverages that do not have many vitamins or protein, such as candy, sweets, and soda. It is better to have a nutritious high-calorie food (such as an avocado) than a food with few nutrients (such as a bag of chips).  Know how many calories are in the foods you eat most often. This will help you calculate calorie counts faster.  Pay attention to calories in drinks. Low-calorie drinks include water and unsweetened drinks.  Pay attention to nutrition labels for "low fat" or "fat free" foods. These foods sometimes have the same amount of calories or more calories than the full fat versions. They also often have added sugar, starch, or salt, to make up for flavor that was removed with the fat.  Find a way of tracking calories that works for you. Get creative. Try different apps or programs if writing down calories does not work for you. What are some portion control tips?  Know how many calories are in a serving. This will help you know how many servings of a certain food you can have.  Use a  measuring cup to measure serving sizes. You could also try weighing out portions on a kitchen scale. With time, you will be able to estimate serving sizes for some foods.  Take some time to put servings of different foods on your favorite plates, bowls, and cups so you know what a serving looks like.  Try not to eat straight from a bag or box. Doing this can lead to overeating. Put the amount you would like to eat in a cup or on a plate to make sure you are eating the right portion.  Use smaller plates, glasses, and bowls to prevent overeating.  Try not to multitask (for   example, watch TV or use your computer) while eating. If it is time to eat, sit down at a table and enjoy your food. This will help you to know when you are full. It will also help you to be aware of what you are eating and how much you are eating. What are tips for following this plan? Reading food labels  Check the calorie count compared to the serving size. The serving size may be smaller than what you are used to eating.  Check the source of the calories. Make sure the food you are eating is high in vitamins and protein and low in saturated and trans fats. Shopping  Read nutrition labels while you shop. This will help you make healthy decisions before you decide to purchase your food.  Make a grocery list and stick to it. Cooking  Try to cook your favorite foods in a healthier way. For example, try baking instead of frying.  Use low-fat dairy products. Meal planning  Use more fruits and vegetables. Half of your plate should be fruits and vegetables.  Include lean proteins like poultry and fish. How do I count calories when eating out?  Ask for smaller portion sizes.  Consider sharing an entree and sides instead of getting your own entree.  If you get your own entree, eat only half. Ask for a box at the beginning of your meal and put the rest of your entree in it so you are not tempted to eat it.  If calories  are listed on the menu, choose the lower calorie options.  Choose dishes that include vegetables, fruits, whole grains, low-fat dairy products, and lean protein.  Choose items that are boiled, broiled, grilled, or steamed. Stay away from items that are buttered, battered, fried, or served with cream sauce. Items labeled "crispy" are usually fried, unless stated otherwise.  Choose water, low-fat milk, unsweetened iced tea, or other drinks without added sugar. If you want an alcoholic beverage, choose a lower calorie option such as a glass of wine or light beer.  Ask for dressings, sauces, and syrups on the side. These are usually high in calories, so you should limit the amount you eat.  If you want a salad, choose a garden salad and ask for grilled meats. Avoid extra toppings like bacon, cheese, or fried items. Ask for the dressing on the side, or ask for olive oil and vinegar or lemon to use as dressing.  Estimate how many servings of a food you are given. For example, a serving of cooked rice is  cup or about the size of half a baseball. Knowing serving sizes will help you be aware of how much food you are eating at restaurants. The list below tells you how big or small some common portion sizes are based on everyday objects: ? 1 oz--4 stacked dice. ? 3 oz--1 deck of cards. ? 1 tsp--1 die. ? 1 Tbsp-- a ping-pong ball. ? 2 Tbsp--1 ping-pong ball. ?  cup-- baseball. ? 1 cup--1 baseball. Summary  Calorie counting means keeping track of how many calories you eat and drink each day. If you eat fewer calories than your body needs, you should lose weight.  A healthy amount of weight to lose per week is usually 1-2 lb (0.5-0.9 kg). This usually means reducing your daily calorie intake by 500-750 calories.  The number of calories in a food can be found on a Nutrition Facts label. If a food does not have a Nutrition   Facts label, try to look up the calories online or ask your dietitian for  help.  Use your calories on foods and drinks that will fill you up, and not on foods and drinks that will leave you hungry.  Use smaller plates, glasses, and bowls to prevent overeating. This information is not intended to replace advice given to you by your health care provider. Make sure you discuss any questions you have with your health care provider. Document Revised: 12/26/2017 Document Reviewed: 03/08/2016 Elsevier Patient Education  2020 [employer]  

## 2020-01-19 ENCOUNTER — Other Ambulatory Visit: Payer: Self-pay | Admitting: Emergency Medicine

## 2020-01-19 DIAGNOSIS — Z8739 Personal history of other diseases of the musculoskeletal system and connective tissue: Secondary | ICD-10-CM

## 2020-01-19 LAB — COMPREHENSIVE METABOLIC PANEL
ALT: 40 IU/L (ref 0–44)
AST: 48 IU/L — ABNORMAL HIGH (ref 0–40)
Albumin/Globulin Ratio: 1.1 — ABNORMAL LOW (ref 1.2–2.2)
Albumin: 4.2 g/dL (ref 4.1–5.2)
Alkaline Phosphatase: 68 IU/L (ref 44–121)
BUN/Creatinine Ratio: 11 (ref 9–20)
BUN: 10 mg/dL (ref 6–20)
Bilirubin Total: 0.6 mg/dL (ref 0.0–1.2)
CO2: 21 mmol/L (ref 20–29)
Calcium: 9.6 mg/dL (ref 8.7–10.2)
Chloride: 101 mmol/L (ref 96–106)
Creatinine, Ser: 0.93 mg/dL (ref 0.76–1.27)
GFR calc Af Amer: 132 mL/min/{1.73_m2} (ref >59)
GFR calc non Af Amer: 114 mL/min/{1.73_m2} (ref >59)
Globulin, Total: 3.7 g/dL (ref 1.5–4.5)
Glucose: 98 mg/dL (ref 65–99)
Potassium: 4 mmol/L (ref 3.5–5.2)
Sodium: 140 mmol/L (ref 134–144)
Total Protein: 7.9 g/dL (ref 6.0–8.5)

## 2020-01-19 LAB — HEMOGLOBIN A1C
Est. average glucose Bld gHb Est-mCnc: 120 mg/dL
Hgb A1c MFr Bld: 5.8 % — ABNORMAL HIGH (ref 4.8–5.6)

## 2020-01-19 LAB — CK: Total CK: 1519 U/L (ref 49–439)

## 2020-01-24 ENCOUNTER — Encounter: Payer: Self-pay | Admitting: Radiology

## 2020-07-17 ENCOUNTER — Ambulatory Visit: Payer: 59 | Admitting: Emergency Medicine

## 2020-10-03 DIAGNOSIS — F200 Paranoid schizophrenia: Secondary | ICD-10-CM | POA: Diagnosis not present

## 2020-10-09 DIAGNOSIS — F200 Paranoid schizophrenia: Secondary | ICD-10-CM | POA: Diagnosis not present

## 2020-10-24 DIAGNOSIS — F200 Paranoid schizophrenia: Secondary | ICD-10-CM | POA: Diagnosis not present

## 2020-11-07 ENCOUNTER — Ambulatory Visit (HOSPITAL_COMMUNITY)
Admission: EM | Admit: 2020-11-07 | Discharge: 2020-11-07 | Disposition: A | Discharge status: Other Acute Inpatient Hospital | Payer: 59 | Attending: Registered Nurse | Admitting: Registered Nurse

## 2020-11-07 ENCOUNTER — Emergency Department (HOSPITAL_COMMUNITY)
Admission: EM | Admit: 2020-11-07 | Discharge: 2020-11-10 | Disposition: A | Discharge status: Other Acute Inpatient Hospital | Payer: 59 | Attending: Emergency Medicine | Admitting: Emergency Medicine

## 2020-11-07 ENCOUNTER — Encounter (HOSPITAL_COMMUNITY): Payer: Self-pay | Admitting: Registered Nurse

## 2020-11-07 ENCOUNTER — Other Ambulatory Visit: Payer: Self-pay

## 2020-11-07 DIAGNOSIS — F202 Catatonic schizophrenia: Secondary | ICD-10-CM | POA: Diagnosis present

## 2020-11-07 DIAGNOSIS — F209 Schizophrenia, unspecified: Secondary | ICD-10-CM | POA: Diagnosis not present

## 2020-11-07 DIAGNOSIS — N179 Acute kidney failure, unspecified: Secondary | ICD-10-CM | POA: Diagnosis not present

## 2020-11-07 DIAGNOSIS — Z6841 Body Mass Index (BMI) 40.0 and over, adult: Secondary | ICD-10-CM | POA: Diagnosis not present

## 2020-11-07 DIAGNOSIS — R55 Syncope and collapse: Secondary | ICD-10-CM | POA: Diagnosis present

## 2020-11-07 DIAGNOSIS — Y900 Blood alcohol level of less than 20 mg/100 ml: Secondary | ICD-10-CM | POA: Insufficient documentation

## 2020-11-07 DIAGNOSIS — M6282 Rhabdomyolysis: Secondary | ICD-10-CM | POA: Diagnosis present

## 2020-11-07 DIAGNOSIS — Z79899 Other long term (current) drug therapy: Secondary | ICD-10-CM | POA: Insufficient documentation

## 2020-11-07 DIAGNOSIS — Z20822 Contact with and (suspected) exposure to covid-19: Secondary | ICD-10-CM | POA: Insufficient documentation

## 2020-11-07 DIAGNOSIS — E860 Dehydration: Secondary | ICD-10-CM

## 2020-11-07 HISTORY — DX: Schizophrenia, unspecified: F20.9

## 2020-11-07 LAB — CBC WITH DIFFERENTIAL/PLATELET
Abs Immature Granulocytes: 0.04 10*3/uL (ref 0.00–0.07)
Basophils Absolute: 0 10*3/uL (ref 0.0–0.1)
Basophils Relative: 0 %
Eosinophils Absolute: 0 10*3/uL (ref 0.0–0.5)
Eosinophils Relative: 0 %
HCT: 43.2 % (ref 39.0–52.0)
Hemoglobin: 14.6 g/dL (ref 13.0–17.0)
Immature Granulocytes: 0 %
Lymphocytes Relative: 11 %
Lymphs Abs: 1.3 10*3/uL (ref 0.7–4.0)
MCH: 31.3 pg (ref 26.0–34.0)
MCHC: 33.8 g/dL (ref 30.0–36.0)
MCV: 92.7 fL (ref 80.0–100.0)
Monocytes Absolute: 1.1 10*3/uL — ABNORMAL HIGH (ref 0.1–1.0)
Monocytes Relative: 9 %
Neutro Abs: 9.6 10*3/uL — ABNORMAL HIGH (ref 1.7–7.7)
Neutrophils Relative %: 80 %
Platelets: 254 10*3/uL (ref 150–400)
RBC: 4.66 MIL/uL (ref 4.22–5.81)
RDW: 14 % (ref 11.5–15.5)
WBC: 11.9 10*3/uL — ABNORMAL HIGH (ref 4.0–10.5)
nRBC: 0 % (ref 0.0–0.2)

## 2020-11-07 LAB — COMPREHENSIVE METABOLIC PANEL
ALT: 77 U/L — ABNORMAL HIGH (ref 0–44)
AST: 181 U/L — ABNORMAL HIGH (ref 15–41)
Albumin: 4.7 g/dL (ref 3.5–5.0)
Alkaline Phosphatase: 66 U/L (ref 38–126)
Anion gap: 18 — ABNORMAL HIGH (ref 5–15)
BUN: 20 mg/dL (ref 6–20)
CO2: 18 mmol/L — ABNORMAL LOW (ref 22–32)
Calcium: 10 mg/dL (ref 8.9–10.3)
Chloride: 106 mmol/L (ref 98–111)
Creatinine, Ser: 2.65 mg/dL — ABNORMAL HIGH (ref 0.61–1.24)
GFR, Estimated: 33 mL/min — ABNORMAL LOW (ref >60)
Glucose, Bld: 94 mg/dL (ref 70–99)
Potassium: 4.2 mmol/L (ref 3.5–5.1)
Sodium: 142 mmol/L (ref 135–145)
Total Bilirubin: 2 mg/dL — ABNORMAL HIGH (ref 0.3–1.2)
Total Protein: 8.6 g/dL — ABNORMAL HIGH (ref 6.5–8.1)

## 2020-11-07 LAB — RESP PANEL BY RT-PCR (FLU A&B, COVID) ARPGX2
Influenza A by PCR: NEGATIVE
Influenza B by PCR: NEGATIVE
SARS Coronavirus 2 by RT PCR: NEGATIVE

## 2020-11-07 LAB — ACETAMINOPHEN LEVEL: Acetaminophen (Tylenol), Serum: <10 ug/mL — ABNORMAL LOW (ref 10–30)

## 2020-11-07 LAB — ETHANOL: Alcohol, Ethyl (B): <10 mg/dL (ref <10)

## 2020-11-07 LAB — SALICYLATE LEVEL: Salicylate Lvl: <7 mg/dL — ABNORMAL LOW (ref 7.0–30.0)

## 2020-11-07 MED ORDER — SODIUM CHLORIDE 0.9 % IV BOLUS
2000.0000 mL | Freq: Once | INTRAVENOUS | Status: AC
  Administered 2020-11-07: 2000 mL via INTRAVENOUS

## 2020-11-07 MED ORDER — METFORMIN HCL 500 MG PO TABS: 500.0000 mg | ORAL_TABLET | Freq: Two times a day (BID) | ORAL | Status: DC

## 2020-11-07 MED ORDER — HALOPERIDOL 5 MG PO TABS
10.0000 mg | ORAL_TABLET | Freq: Every day | ORAL | Status: DC
  Filled 2020-11-07 (×2): qty 2

## 2020-11-07 MED ORDER — SODIUM CHLORIDE 0.9 % IV BOLUS: 1000.0000 mL | Freq: Once | INTRAVENOUS | Status: DC

## 2020-11-07 NOTE — BH Assessment (Signed)
Pt under IVC. Denies SI,HI, AVH. Pt is urgent

## 2020-11-07 NOTE — ED Provider Notes (Signed)
[facility] EMERGENCY DEPARTMENT Provider Note   CSN: [id number] Arrival date & time: 11/07/20  1928     History Chief Complaint  Patient presents with   Near Syncope    Kincaid Tiger is a 25 y.o. male.  25 yo M with a chief complaints of being IVCD.  The patient's has no actual complaints.  He initially tells me that he is very dizzy and feels like he is on a boat and then later when I am talking to about he says nevermind he feels fine.  He denies any complaints after that tells me at one point that he needs medication to help him with his bowels though small to explain if it is to stop diarrhea or help constipation.  He denies any chest pain or trouble breathing denies abdominal pain.  Had a report of near syncopal event and there was some talk of may be taking too many weight loss drugs.  He denies any of this.  Denies taking anything and overdose.  IVC paperwork states that he has not been taking his medications and has been having more hallucinations and is a danger to himself.  The history is provided by the patient.  Near Syncope This is a new problem. The problem occurs constantly. The problem has not changed since onset.Pertinent negatives include no chest pain, no abdominal pain, no headaches and no shortness of breath. Nothing aggravates the symptoms. Nothing relieves the symptoms. He has tried nothing for the symptoms. The treatment provided no relief.      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     Family History  Problem Relation Age of Onset   Mental illness Brother     Social History   Tobacco Use   Smoking status: Never    Smokeless tobacco: Never  Substance Use Topics   Alcohol use: Never   Drug use: Never    Home Medications Prior to Admission medications   Medication Sig Start Date End Date Taking? Authorizing Provider  haloperidol (HALDOL) 10 MG tablet Take 10 mg by mouth at bedtime.    [provider]  haloperidol decanoate (HALDOL DECANOATE) 100 MG/ML injection Inject 100 mg into the muscle every 28 (twenty-eight) days. Patient not taking: Reported on 01/18/2020    [provider]  metFORMIN (GLUCOPHAGE) 500 MG tablet Take 1 tablet (500 mg total) by mouth 2 (two) times daily with a meal. 01/18/20 04/17/20  Georgina Quint, MD    Allergies    Patient has no known allergies.  Review of Systems   Review of Systems  Unable to perform ROS: Psychiatric disorder  Constitutional:  Negative for chills and fever.  HENT:  Negative for congestion and facial swelling.   Eyes:  Negative for discharge and visual disturbance.  Respiratory:  Negative for shortness of breath.   Cardiovascular:  Positive for near-syncope. Negative for chest pain and palpitations.  Gastrointestinal:  Negative for abdominal pain, diarrhea and vomiting.  Musculoskeletal:  Negative for arthralgias and myalgias.  Skin:  Negative for color change and rash.  Neurological:  Negative for tremors, syncope and headaches.  Psychiatric/Behavioral:  Negative for  confusion and dysphoric mood.    Physical Exam Updated Vital Signs BP 119/84   Pulse (!) 106   Temp 99.3 F (37.4 C) (Oral)   Resp 18   SpO2 98%   Physical Exam Vitals and nursing note reviewed.  Constitutional:      Appearance: He is well-developed.  HENT:     Head: Normocephalic and atraumatic.  Eyes:     Pupils: Pupils are equal, round, and reactive to light.  Neck:     Vascular: No JVD.  Cardiovascular:     Rate and Rhythm: Normal rate and regular rhythm.     Heart sounds: No murmur heard.   No friction rub. No gallop.  Pulmonary:      Effort: No respiratory distress.     Breath sounds: No wheezing.  Abdominal:     General: There is no distension.     Tenderness: There is no abdominal tenderness. There is no guarding or rebound.  Musculoskeletal:        General: Normal range of motion.     Cervical back: Normal range of motion and neck supple.  Skin:    Coloration: Skin is not pale.     Findings: No rash.  Neurological:     Mental Status: He is alert and oriented to person, place, and time.  Psychiatric:        Mood and Affect: Affect is blunt.        Speech: Speech is rapid and pressured and tangential.        Behavior: Behavior is agitated.    ED Results / Procedures / Treatments   Labs (all labs ordered are listed, but only abnormal results are displayed) Labs Reviewed  RESP PANEL BY RT-PCR (FLU A&B, COVID) [id number]  COMPREHENSIVE METABOLIC PANEL  ETHANOL  RAPID URINE DRUG SCREEN, HOSP PERFORMED  CBC WITH DIFFERENTIAL/PLATELET  ACETAMINOPHEN LEVEL  SALICYLATE LEVEL    EKG None  Radiology No results found.  Procedures Procedures   Medications Ordered in ED Medications - No data to display  ED Course  I have reviewed the triage vital signs and the nursing notes.  Pertinent labs & imaging results that were available during my care of the patient were reviewed by me and considered in my medical decision making (see chart for details).    MDM Rules/Calculators/A&P                           25 yo M with a chief complaints of increasing paranoid behavior, has a history of catatonic schizophrenia.  IVC by his family.  He denies any complaints.  He was initially seen over at the behavioral health urgent care, had what sounds like a syncopal event had diaphoretic pallor.  Likely vasovagal.  We will have TTS evaluate.  Patient's renal function is worse than baseline.  Mild acidosis.  We will obtain a CK as the patient's had a history of rhabdo in the past.  2 L of IV fluids.  Reassess.  Will likely  recheck BMP in the morning at that point improvement medically clear.  The patients results and plan were reviewed and discussed.   Any x-rays performed were independently reviewed by myself.   Differential diagnosis were considered with the presenting HPI.  Medications  sodium chloride 0.9 % bolus 2,000 mL (has no administration in time range)  haloperidol (HALDOL) tablet 10 mg (has no administration in time range)  metFORMIN (GLUCOPHAGE) tablet 500 mg (has  no administration in time range)    Vitals:   11/07/20 1945 11/07/20 1948  BP: 119/84   Pulse: (!) 106   Resp:  18  Temp:  99.3 F (37.4 C)  TempSrc:  Oral  SpO2: 98%     Final diagnoses:  None      Final Clinical Impression(s) / ED Diagnoses Final diagnoses:  None    Rx / DC Orders ED Discharge Orders     None        Melene Plan, DO 11/07/20 2307

## 2020-11-07 NOTE — ED Notes (Signed)
Called report to Maralyn Sago, Consulting civil engineer at [employer]

## 2020-11-07 NOTE — Progress Notes (Addendum)
Pt transferred  to [facility] via EMS due to syncopal episode.

## 2020-11-07 NOTE — ED Notes (Signed)
Pt in room, talking to self.  

## 2020-11-07 NOTE — BH Assessment (Signed)
Comprehensive Clinical Assessment (CCA) Note  11/07/2020 Noah Ramsey [id number]  Disposition: Per Assunta Found, NP, patient is recommended for inpatient treatment  Flowsheet Row ED from 11/07/2020 in [facility]  C-SSRS RISK CATEGORY No Risk      The patient demonstrates the following risk factors for suicide: Chronic risk factors for suicide include: psychiatric disorder of Schizophrenia . Acute risk factors for suicide include: N/A. Protective factors for this patient include: positive social support, positive therapeutic relationship, and responsibility to others (children, family). Considering these factors, the overall suicide risk at this point appears to be low. Patient is not appropriate for outpatient follow up. Noah Ramsey is a 25 year old male presenting to [facility] under IVC. 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 He has medications for his mental health issues but family states that medications does not appear to be effective and may be affected by supplements that respondent is taking. Respondent has history of mental commitments, most recently in [place] in 2018. Family states that respondent is talking to himself and carrying on conversation with someone who is not there. Family states respondent is threatening to harm his family members. In addition to threatening language he makes aggressive gestures towards his family. Family is concerned for his well being as he continues to act erratically/aggressively towards them".   Patient presenting psychotic and is observed responding to internal stimuli. Limited information is gathered from patient due to mental status. Patient reports that he is "running for my life" and reports that his brother is threatening to kill him. Patient denies aggressiveness towards his family however reports that he does make gestures. Patient proceeds to stand up and swing his  arms and fist as if he was hitting someone. Patient denies SI, HI, AVH and reports that he is complaint with his medications. Patient unable to share what medications he is taking. Patient reports that he had an appointment with the provider and reported that the provider said he was "blessed".    Patient consents for TTS to obtain collateral from his mother/petitioner Noah Ramsey who reports that patient has been decompensating for the past two weeks. Mom reports that patient has been talking to himself, acting erratically and aggressive towards the family and patient thinks that he and his brother is in conflict. Mom reports that patient and his brother have a good relationship and reports that at base line patient is a calm person.  Mom states that she noticed a change in patient behaviors around the time patient started taking supplements for weight loss. Mom reports that patient is taking "yohimbine" and some other supplement he bought offline. Mom reports that patient takes psychotic medications and sees a provider in [place] for medication management. Mom said she scheduled a video visit with patient provider however patient declined to meet with provider. Mom reports that patient last inpatient commitment was in [place]. Mom also reports that patient has high creatine levels and reports he has Creatine Phosphokinase (CPK). Mom states that his levels run about 1200 and normal is 10-120. Mom states that patient needs to drink plenty of water.    Chief Complaint:  Chief Complaint  Patient presents with   Schizophrenia   IVC   Visit Diagnosis: Schizophrenia     CCA Screening, Triage and Referral (STR)  Patient Reported Information How did you hear about [place]? Legal System  What Is the Reason for Your Visit/Call Today? IVC  How Long Has This Been Causing  You Problems? 1 wk - 1 month  What Do You Feel Would Help You the Most Today? Treatment for Depression or other mood problem   Have You  Recently Had Any Thoughts About Hurting Yourself? No  Are You Planning to Commit Suicide/Harm Yourself At This time? No   Have you Recently Had Thoughts About Hurting Someone Noah Ramsey? No  Are You Planning to Harm Someone at This Time? No  Explanation: No data recorded  Have You Used Any Alcohol or Drugs in the Past 24 Hours? No  How Long Ago Did You Use Drugs or Alcohol? No data recorded What Did You Use and How Much? No data recorded  Do You Currently Have a Therapist/Psychiatrist? No data recorded Name of Therapist/Psychiatrist: No data recorded  Have You Been Recently Discharged From Any Office Practice or Programs? No data recorded Explanation of Discharge From Practice/Program: No data recorded    CCA Screening Triage Referral Assessment Type of Contact: No data recorded Telemedicine Service Delivery:   Is this Initial or Reassessment? No data recorded Date Telepsych consult ordered in [facility]:  No data recorded Time Telepsych consult ordered in [facility]:  No data recorded Location of Assessment: No data recorded Provider Location: No data recorded  Collateral Involvement: No data recorded  Does Patient Have a Court Appointed Legal Guardian? No data recorded Name and Contact of Legal Guardian: No data recorded If Minor and Not Living with Parent(s), Who has Custody? No data recorded Is CPS involved or ever been involved? No data recorded Is APS involved or ever been involved? No data recorded  Patient Determined To Be At Risk for Harm To Self or Others Based on Review of Patient Reported Information or Presenting Complaint? No data recorded Method: No data recorded Availability of Means: No data recorded Intent: No data recorded Notification Required: No data recorded Additional Information for Danger to Others Potential: No data recorded Additional Comments for Danger to Others Potential: No data recorded Are There Guns or Other Weapons in Your Home? No data recorded Types  of Guns/Weapons: No data recorded Are These Weapons Safely Secured?                            No data recorded Who Could Verify You Are Able To Have These Secured: No data recorded Do You Have any Outstanding Charges, Pending Court Dates, Parole/Probation? No data recorded Contacted To Inform of Risk of Harm To Self or Others: No data recorded   Does Patient Present under Involuntary Commitment? No data recorded IVC Papers Initial File Date: No data recorded  [place] of Residence: No data recorded  Patient Currently Receiving the Following Services: No data recorded  Determination of Need: Urgent (48 hours)   Options For Referral: Outpatient Therapy; Medication Management; Inpatient Hospitalization     CCA Biopsychosocial Patient Reported Schizophrenia/Schizoaffective Diagnosis in Past: Yes   Strengths: NA   Mental Health Symptoms Depression:   None   Duration of Depressive symptoms:    Mania:   Change in energy/activity; Irritability   Anxiety:    Worrying; Tension   Psychosis:   Grossly disorganized or catatonic behavior; Delusions   Duration of Psychotic symptoms:  Duration of Psychotic Symptoms: Greater than six months   Trauma:   None   Obsessions:   None   Compulsions:   None   Inattention:   None   Hyperactivity/Impulsivity:   None   Oppositional/Defiant Behaviors:   None  Emotional Irregularity:   None   Other Mood/Personality Symptoms:  No data recorded   Mental Status Exam Appearance and self-care  Stature:   Tall   Weight:   Obese   Clothing:   Age-appropriate   Grooming:   Neglected   Cosmetic use:   None   Posture/gait:   Tense   Motor activity:   Not Remarkable   Sensorium  Attention:   Normal   Concentration:   Scattered   Orientation:   Person   Recall/memory:   Defective in Short-term   Affect and Mood  Affect:   Labile   Mood:   Hypomania   Relating  Eye contact:   Normal   Facial  expression:   Anxious   Attitude toward examiner:   Cooperative   Thought and Language  Speech flow:  Pressured   Thought content:   Persecutions   Preoccupation:   None   Hallucinations:   None   Organization:  No data recorded  [employer] of Knowledge:   Impoverished by (Comment)   Intelligence:   Average   Abstraction:  No data recorded  Judgement:   Impaired   Reality Testing:   Distorted   Insight:   Lacking   Decision Making:   Confused   Social Functioning  Social Maturity:   Responsible   Social Judgement:  No data recorded  Stress  Stressors:  No data recorded  Coping Ability:  No data recorded  Skill Deficits:   None   Supports:   Family; Friends/Service system     Religion:    Leisure/Recreation:    Exercise/Diet:     CCA Employment/Education Employment/Work Situation: Employment / Work Situation Employment Situation: Unemployed Patient's Job has Been Impacted by Ramsey Illness: No  Education:     CCA Family/Childhood History Family and Relationship History: Family history Marital status: Single  Childhood History:  Childhood History By whom was/is the patient raised?: Mother  Child/Adolescent Assessment:     CCA Substance Use Alcohol/Drug Use: Alcohol / Drug Use Pain Medications: See MAR Prescriptions: See MAR Over the Counter: See MAR History of alcohol / drug use?: No history of alcohol / drug abuse                         ASAM's:  Six Dimensions of Multidimensional Assessment  Dimension 1:  Acute Intoxication and/or Withdrawal Potential:      Dimension 2:  Biomedical Conditions and Complications:      Dimension 3:  Emotional, Behavioral, or Cognitive Conditions and Complications:     Dimension 4:  Readiness to Change:     Dimension 5:  Relapse, Continued use, or Continued Problem Potential:     Dimension 6:  Recovery/Living Environment:     ASAM Severity Score:     ASAM Recommended Level of Treatment:     Substance use Disorder (SUD)    Recommendations for Services/Supports/Treatments:    Discharge Disposition:    DSM5 Diagnoses: Patient Active Problem List   Diagnosis Date Noted   Schizophrenia, 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

## 2020-11-07 NOTE — ED Notes (Signed)
IVC paperwork and First exam faxed to [facility].

## 2020-11-07 NOTE — ED Provider Notes (Signed)
Behavioral Health Urgent Care Medical Screening Exam  Patient Name: Noah Ramsey MRN: [id number] Date of Evaluation: 11/07/20 Chief Complaint:   Diagnosis:  Final diagnoses:  None    History of Present illness: Noah Ramsey is a 25 y.o. male patient presented to [facility] as a walk in via [employer] under IVC petitioned by his mother with complaints of  with complaints of worsening of psychotic symptoms:  Patient taking to himself and aggressive gestures and threatening family  Noah Ramsey, 25 y.o., male patient seen face to face by this provider, consulted with Dr. Earlene Plater; and chart reviewed on 11/07/20.  On evaluation Noah Ramsey reports he is feeling fine "I'm blessed."  Patient stating that he is fine and that he has been taking his medications.  Patient has outpatient services with Dr. Maggie Schwalbe and was seen virtually a month ago.  Patient is prescribed Latuda 40 mg Q hs and Propranolol 20 mg Q hs prn for sleep.  Patient is not a good historian.  Patient reporting he hasn't done anything wrong an that his brother is the one messing with him.  "My brother threatening me."  Then patient stood up and started throwing his fist demonstrating how he would hit his brother it his brother hit him and then sat back down stating he was fine.  Patient gave permission to speak to his mother.    Collateral Information:  TTS counselor spoke to patients mother who states patient has been doing fine and has had no issues since 2018.  Reports patient has been taking his medications but a couple weeks ago patient stating he wanted to lose some weight and has been taking an over the counter diet pill called Yohimbine.  Mother feels that the supplement is affecting patients medications.     While gathering collateral information This Clinical research associate informed that patient has been tachycardic, diaphoretic, and has passed out.  Unable to arouse patient,  EMS called and report called Dr.  Cristal Deer Tegeler at [facility] ED.      Psychiatric Specialty Exam  Presentation  General Appearance:Appropriate for Environment  Eye Contact:Good  Speech:Clear and Coherent; Pressured  Speech Volume:Normal  Handedness:Right   Mood and Affect  Mood:Labile  Affect:Labile   Thought Process  Thought Processes:Disorganized  Descriptions of Associations:Tangential  Orientation:Full (Time, Place and Person)  Thought Content:Tangential; Delusions  Diagnosis of Schizophrenia or Schizoaffective disorder in past: Yes  Duration of Psychotic Symptoms: Greater than six months  Hallucinations:Auditory Patient would not describe  Ideas of Reference:Paranoia  Suicidal Thoughts:No  Homicidal Thoughts:No   Sensorium  Memory:Immediate Poor; Recent Poor  Judgment:Impaired  Insight:Lacking   Executive Functions  Concentration:Poor  Attention Span:Poor  Recall:Poor  Fund of Knowledge:Poor  Language:Fair   Psychomotor Activity  Psychomotor Activity:Normal   Assets  Assets:Communication Skills; Housing; Social Support   Sleep  Sleep:Fair  Number of hours:  No data recorded  Nutritional Assessment (For OBS and FBC admissions only) Has the patient had a weight loss or gain of 10 pounds or more in the last 3 months?: No Has the patient had a decrease in food intake/or appetite?: No Does the patient have dental problems?: No Does the patient have eating habits or behaviors that may be indicators of an eating disorder including binging or inducing vomiting?: No Has the patient been eating poorly because of a decreased appetite?: No   Physical Exam: Physical Exam Vitals reviewed.  Constitutional:      Appearance: He is obese. He is diaphoretic.  Cardiovascular:  Rate and Rhythm: Tachycardia present.  Pulmonary:     Effort: Pulmonary effort is normal.  Musculoskeletal:        General: Normal range of motion.     Cervical back: Normal range of motion.   Neurological:     Mental Status: He is alert.  Psychiatric:        Attention and Perception: He perceives auditory hallucinations.        Mood and Affect: Mood is anxious. Affect is labile.        Speech: Speech normal.        Behavior: Behavior is cooperative.        Thought Content: Thought content is paranoid and delusional. Thought content does not include suicidal ideation.        Judgment: Judgment is impulsive.   Review of Systems  Unable to perform ROS: Acuity of condition (Patient not a good historian)  Blood pressure 96/74, pulse (!) 126, temperature (!) 100.5 F (38.1 C), resp. rate (!) 24, SpO2 97 %. There is no height or weight on file to calculate BMI.  Musculoskeletal: Strength & Muscle Tone: within normal limits Gait & Station: normal Patient leans: N/A   [facility] MSE Discharge Disposition for Follow up and Recommendations: Based on my evaluation the patient appears to have an emergency medical condition for which I recommend the patient be transferred to the emergency department for further evaluation.   Disposition: Recommend psychiatric Inpatient admission when medically cleared.    Noah Peaster, NP 11/07/2020, 6:57 PM

## 2020-11-07 NOTE — ED Triage Notes (Signed)
PT BIB EMS from [facility] due to a syncopal episode. Pt has a hx of schizophrenia. Pt has a syncopal episode at [facility]. Pt turned diaphoretic and pale. EMS gave pt 750 bolus of NS. Pt is IVC.

## 2020-11-08 ENCOUNTER — Encounter (HOSPITAL_COMMUNITY): Payer: Self-pay | Admitting: Internal Medicine

## 2020-11-08 DIAGNOSIS — N179 Acute kidney failure, unspecified: Secondary | ICD-10-CM

## 2020-11-08 DIAGNOSIS — M6282 Rhabdomyolysis: Secondary | ICD-10-CM

## 2020-11-08 DIAGNOSIS — Z6841 Body Mass Index (BMI) 40.0 and over, adult: Secondary | ICD-10-CM

## 2020-11-08 DIAGNOSIS — F202 Catatonic schizophrenia: Secondary | ICD-10-CM | POA: Diagnosis not present

## 2020-11-08 DIAGNOSIS — R55 Syncope and collapse: Secondary | ICD-10-CM | POA: Diagnosis not present

## 2020-11-08 LAB — CK
Total CK: 10304 U/L — ABNORMAL HIGH (ref 49–397)
Total CK: 8677 U/L — ABNORMAL HIGH (ref 49–397)

## 2020-11-08 LAB — BASIC METABOLIC PANEL
Anion gap: 11 (ref 5–15)
BUN: 19 mg/dL (ref 6–20)
CO2: 20 mmol/L — ABNORMAL LOW (ref 22–32)
Calcium: 8.9 mg/dL (ref 8.9–10.3)
Chloride: 103 mmol/L (ref 98–111)
Creatinine, Ser: 1.61 mg/dL — ABNORMAL HIGH (ref 0.61–1.24)
GFR, Estimated: >60 mL/min (ref >60)
Glucose, Bld: 87 mg/dL (ref 70–99)
Potassium: 3.4 mmol/L — ABNORMAL LOW (ref 3.5–5.1)
Sodium: 134 mmol/L — ABNORMAL LOW (ref 135–145)

## 2020-11-08 LAB — URINALYSIS, COMPLETE (UACMP) WITH MICROSCOPIC
Bilirubin Urine: NEGATIVE
Glucose, UA: NEGATIVE mg/dL
Ketones, ur: NEGATIVE mg/dL
Leukocytes,Ua: NEGATIVE
Nitrite: NEGATIVE
Protein, ur: NEGATIVE mg/dL
Specific Gravity, Urine: 1.011 (ref 1.005–1.030)
pH: 6 (ref 5.0–8.0)

## 2020-11-08 LAB — RAPID URINE DRUG SCREEN, HOSP PERFORMED
Amphetamines: NOT DETECTED
Barbiturates: NOT DETECTED
Benzodiazepines: NOT DETECTED
Cocaine: NOT DETECTED
Opiates: NOT DETECTED
Tetrahydrocannabinol: NOT DETECTED

## 2020-11-08 MED ORDER — OXYCODONE HCL 5 MG PO TABS: 5.0000 mg | ORAL_TABLET | ORAL | Status: DC | PRN

## 2020-11-08 MED ORDER — STERILE WATER FOR INJECTION IJ SOLN
INTRAMUSCULAR | Status: AC
  Administered 2020-11-08: 0.6 mL
  Filled 2020-11-08: qty 10

## 2020-11-08 MED ORDER — ZIPRASIDONE MESYLATE 20 MG IM SOLR
10.0000 mg | Freq: Once | INTRAMUSCULAR | Status: AC
  Administered 2020-11-08: 10 mg via INTRAMUSCULAR
  Filled 2020-11-08: qty 20

## 2020-11-08 MED ORDER — ONDANSETRON HCL 4 MG/2ML IJ SOLN: 4.0000 mg | Freq: Four times a day (QID) | INTRAMUSCULAR | Status: DC | PRN

## 2020-11-08 MED ORDER — LACTATED RINGERS IV SOLN: INTRAVENOUS | Status: DC

## 2020-11-08 MED ORDER — METHOCARBAMOL 1000 MG/10ML IJ SOLN
500.0000 mg | Freq: Four times a day (QID) | INTRAVENOUS | Status: DC | PRN
  Filled 2020-11-08: qty 5

## 2020-11-08 MED ORDER — ACETAMINOPHEN 325 MG PO TABS: 650.0000 mg | ORAL_TABLET | Freq: Four times a day (QID) | ORAL | Status: DC | PRN

## 2020-11-08 MED ORDER — OLANZAPINE 5 MG PO TBDP
5.0000 mg | ORAL_TABLET | Freq: Once | ORAL | Status: AC
  Administered 2020-11-08: 5 mg via ORAL
  Filled 2020-11-08: qty 1

## 2020-11-08 MED ORDER — LORAZEPAM 2 MG/ML IJ SOLN
2.0000 mg | Freq: Once | INTRAMUSCULAR | Status: AC
  Administered 2020-11-08: 2 mg via INTRAMUSCULAR
  Filled 2020-11-08: qty 1

## 2020-11-08 MED ORDER — ONDANSETRON HCL 4 MG PO TABS: 4.0000 mg | ORAL_TABLET | Freq: Four times a day (QID) | ORAL | Status: DC | PRN

## 2020-11-08 MED ORDER — ACETAMINOPHEN 650 MG RE SUPP: 650.0000 mg | Freq: Four times a day (QID) | RECTAL | Status: DC | PRN

## 2020-11-08 MED ORDER — POTASSIUM CHLORIDE CRYS ER 20 MEQ PO TBCR
20.0000 meq | EXTENDED_RELEASE_TABLET | Freq: Once | ORAL | Status: AC
  Administered 2020-11-08: 20 meq via ORAL
  Filled 2020-11-08: qty 1

## 2020-11-08 MED ORDER — ENOXAPARIN SODIUM 40 MG/0.4ML IJ SOSY: 40.0000 mg | PREFILLED_SYRINGE | INTRAMUSCULAR | Status: DC

## 2020-11-08 MED ORDER — HYDRALAZINE HCL 20 MG/ML IJ SOLN
5.0000 mg | INTRAMUSCULAR | Status: DC | PRN
  Filled 2020-11-08: qty 0.25

## 2020-11-08 MED ORDER — HALOPERIDOL LACTATE 5 MG/ML IJ SOLN
5.0000 mg | Freq: Once | INTRAMUSCULAR | Status: AC
  Administered 2020-11-08: 5 mg via INTRAMUSCULAR
  Filled 2020-11-08: qty 1

## 2020-11-08 MED ORDER — HYDROXYZINE HCL 25 MG PO TABS
25.0000 mg | ORAL_TABLET | Freq: Once | ORAL | Status: AC
  Administered 2020-11-08: 25 mg via ORAL
  Filled 2020-11-08: qty 1

## 2020-11-08 MED ORDER — ENOXAPARIN SODIUM 80 MG/0.8ML IJ SOSY: 70.0000 mg | PREFILLED_SYRINGE | INTRAMUSCULAR | Status: DC

## 2020-11-08 MED ORDER — DIPHENHYDRAMINE HCL 50 MG/ML IJ SOLN
50.0000 mg | Freq: Once | INTRAMUSCULAR | Status: AC
  Administered 2020-11-08: 50 mg via INTRAMUSCULAR
  Filled 2020-11-08: qty 1

## 2020-11-08 NOTE — ED Notes (Signed)
Pt standing in doorway talking to self, laughing.

## 2020-11-08 NOTE — Progress Notes (Signed)
Patient has been faxed out after being denied by [facility]. Patient meets inpatient criteria per [facility] Rankin,NP. Patient referred to the following facilities:  [facility]  [address], [place] [address] [phone]  [facility]  [address], [place] [address] [phone]  [facility]  [address], [place] [address] [phone]  [facility]  [address] [place] [address] [phone]  [facility]  [address] [place], [place] [address] [phone]  [facility]  [address] [place], [place] [address] [phone]  [facility]  [address] [place] [address] [phone]  [facility]  [address], [place] [address] [phone]  [facility]  [address], [place] [address] [phone]    CSW will continue to monitor disposition.    Damita Dunnings, MSW, LCSW-A  1:28 PM 11/08/2020

## 2020-11-08 NOTE — ED Notes (Signed)
Pt laying in bed at this time.  

## 2020-11-08 NOTE — ED Notes (Signed)
Patient attempting to rip IV and fluids from arm; pt unwilling to continue with fluids and continues to ask for cups of water; RN removed IV at patient request; Pt also refused psy med; EDP notified-Monique,RN

## 2020-11-08 NOTE — Progress Notes (Signed)
Patient information has been sent to [facility] via secure chat to review for potential admission. Patient meets inpatient criteria per Assunta Found, NP.   Situation ongoing, CSW will continue to monitor progress.    Signed:  Damita Dunnings, MSW, LCSW-A  11/08/2020 8:13 AM

## 2020-11-08 NOTE — ED Notes (Addendum)
Patient still refusing IV placement. Educated patient on need for IV fluids related to medical condition and patient still refused. Brought patient a large cup of water and encouraged him to continue to hydrate.

## 2020-11-08 NOTE — ED Notes (Signed)
Pt came out to desk screaming "I'm going to be the grim reaper for the rest of my life". Approached [employer] officer and this RN stating "just shoot me right now. Shoot me up right now I have nothing to live for". Pt banged fist on desk. Security and [employer] officers at bedside. Theron Arista, ED PA made aware of pts behavior. Orders for medication placed at this time.

## 2020-11-08 NOTE — Consult Note (Signed)
ER Consult Note   Noah Ramsey GGY:[id number] DOB: [birth year] DOA: 11/07/2020  PCP: Georgina Quint, MD Consultants:  Maggie Schwalbe - psychiatry Patient coming from:  Home  Chief Complaint: Worsening psychotic symptoms  HPI: Noah Ramsey is a 24 y.o. male with medical history significant of schizophrenia; rhabdomyolysis; and morbid obesity presenting with worsening psychotic symptoms. The patient appeared comfortable at the time of my evaluation.  He had somewhat muffled speech and exhibited a general disinterest in evaluation or treatment.  He reported that he was fine, that he has been eating/drinking well, that nothing hurts, and that folks have been bothering him.  He refused IV placement.    ED Course: Carryover, per Dr. Antionette Char:  24 yr old gentleman with hx of schizophrenia reportedly IVC'd by family due to not taking his medications, having increased hallucinations, and posing danger to self who was seen at a [facility] where he became diaphoretic and pale, had a near-syncope or syncope, and was then sent to ED for eval. He was given 750 cc NS by EMS prior to arrival. ED could not obtain reliable history from the pt. He was found to have creatinine of 2.65 (0.9 in Sept '21), CK 10k, and mild elevations in transaminases and total bili. Tox screen is negative. He was given 2 liters of NS.    Review of Systems: As per HPI; otherwise review of systems reviewed and negative. Unable to effectively perform.  Ambulatory Status:  Ambulates without assistance    Past Medical History:  Diagnosis Date   Elevated CPK    per patient   Schizophrenia [facility])     History reviewed. No pertinent surgical history.  Social History   Socioeconomic History   Marital status: Single    Spouse name: Not on file   Number of children: Not on file   Years of education: Not on file   Highest education level: Not on file  Occupational History   Not on file  Tobacco Use    Smoking status: Never   Smokeless tobacco: Never  Substance and Sexual Activity   Alcohol use: Never   Drug use: Never   Sexual activity: Not on file  Other Topics Concern   Not on file  Social History Narrative   Not on file   Social Determinants of Health   Financial Resource Strain: Not on file  Food Insecurity: Not on file  Transportation Needs: Not on file  Physical Activity: Not on file  Stress: Not on file  Social Connections: Not on file  Intimate Partner Violence: Not on file    No Known Allergies  Family History  Problem Relation Age of Onset   Mental illness Brother     Prior to Admission medications   Medication Sig Start Date End Date Taking? Authorizing Provider  haloperidol (HALDOL) 10 MG tablet Take 10 mg by mouth at bedtime.    [provider]  haloperidol decanoate (HALDOL DECANOATE) 100 MG/ML injection Inject 100 mg into the muscle every 28 (twenty-eight) days. Patient not taking: Reported on 01/18/2020    [provider]  metFORMIN (GLUCOPHAGE) 500 MG tablet Take 1 tablet (500 mg total) by mouth 2 (two) times daily with a meal. 01/18/20 04/17/20  Georgina Quint, MD    Physical Exam: Vitals:   11/07/20 1948 11/07/20 2255 11/08/20 0638 11/08/20 0900  BP:  (!) 144/120 126/69   Pulse:  (!) 109 (!) 109   Resp: 18 20 18    Temp: 99.3 F (  37.4 C) 98.7 F (37.1 C) 98.5 F (36.9 C)   TempSrc: Oral Oral Oral   SpO2:  94% 99%   Weight:    (!) 143.3 kg  Height:    5\' 7"  (1.702 m)     General:  Appears calm and comfortable and is in NAD Eyes:  PERRL, EOMI, normal lids, iris ENT:  grossly normal hearing, lips & tongue, mmm Neck:  no LAD, masses or thyromegaly Cardiovascular:  RR with mild tachycardia, no m/r/g. No LE edema.  Respiratory:   CTA bilaterally with no wheezes/rales/rhonchi.  Normal respiratory effort. Abdomen:  soft, NT, ND Skin:  no rash or induration seen on limited exam Musculoskeletal:  grossly normal tone  BUE/BLE, good ROM, no bony abnormality Psychiatric:  eccentric mood and affect, speech muffled and somewhat hostile Neurologic:  CN 2-12 grossly intact, moves all extremities in coordinated fashion    Radiological Exams on Admission: Independently reviewed - see discussion in A/P where applicable  No results found.  EKG: Independently reviewed.  Sinus tachycardia with rate 109; nonspecific ST changes that are likely rate-related   Labs on Admission: I have personally reviewed the available labs and imaging studies at the time of the admission.  Pertinent labs:   BUN 19/Creatinine 1.61/GFR >60; 20/2.65/33 on 7/19; normal in 12/2019 CK 10,304 WBC 11.9 COVID/flu negative ETOH <10 ASA <7 UDS negative   Assessment/Plan Principal Problem:   Rhabdomyolysis Active Problems:   Body mass index (BMI) of 45.0-49.9 in adult (HCC)   Schizophrenia, catatonic type (HCC)   AKI (acute kidney injury) (HCC)   Rhabdomyolysis -Uncertain etiology - patient denies issues -While in the ER, he has had downtrending CK, improving renal function, refusing IV.  -While observation with IV hydration would be reasonable, he can aggressively push PO fluids at [facility] and so is medically appropriate for pysch transfer -AKI is improving and is expected to resolve, as it has already peaked  Schizophrenia -TTS has consulted and recommends inpatient psychiatric admission -Will defer  Obesity -Body mass index is 49.48 kg/m..  -Weight loss should be encouraged -Outpatient PCP/bariatric medicine f/u encouraged       Note: This patient has been tested and is negative for the novel coronavirus COVID-19.   Thank you for this interesting consult.  Based on improving medical condition and anticipated resolution of AKI/rhabdo without further intervention, he appears to be stable for transfer to inpatient psych at this time.  [facility] will sign off, please call for any additional questions.   [facility]  MD [facility]   How to contact the [facility] Attending or Consulting provider 7A - 7P or covering provider during after hours 7P -7A, for this patient?  Check the care team in [facility] and look for a) attending/consulting [facility] provider listed and b) the [facility] team listed Log into [email or web address] and use [place]'s universal password to access. If you do not have the password, please contact the hospital operator. Locate the [facility] provider you are looking for under [facility] and page to a number that you can be directly reached. If you still have difficulty reaching the provider, please page the [facility] (Director on Call) for the Hospitalists listed on amion for assistance.   11/08/2020, 2:05 PM

## 2020-11-08 NOTE — ED Notes (Signed)
Pt resting in bed at this time. 

## 2020-11-08 NOTE — ED Notes (Signed)
Patient walked pass RN station and continued to the other side of unit; Sitter followed patient and was able to redirect patient back in room; EDP notified and new med orders placed-Monique,RN

## 2020-11-08 NOTE — ED Notes (Addendum)
Patient medically cleared by Dr. Ophelia Charter. No additional lab draws need to be completed and patient can begin the process of transferring patient to [facility], per Dr. Ophelia Charter.

## 2020-11-08 NOTE — ED Provider Notes (Signed)
  Physical Exam  BP 126/69 (BP Location: Left Arm)   Pulse (!) 109   Temp 98.5 F (36.9 C) (Oral)   Resp 18   Ht 5\' 7"  (1.702 m)   Wt (!) 143.3 kg   SpO2 99%   BMI 49.48 kg/m   Physical Exam  ED Course/Procedures     Procedures  MDM  Patient has been seen by internal medicine.  Refusing IV.  And hydrate at behavioral health is easily as he can hear.  Cleared for placement.       , MD 11/08/20 [phone]

## 2020-11-08 NOTE — ED Notes (Signed)
CK added on to previous drawn lab-Monique,RN

## 2020-11-08 NOTE — ED Provider Notes (Addendum)
  Provider Note MRN:  [id number]  Arrival date & time: 11/08/20    ED Course and Medical Decision Making  Assumed care from Dr. Adela Lank at shift change.  Catatonic schizophrenia, labs with AKI likely in the setting of dehydration.    CK has returned at greater than 10,000.  BMP improved this morning despite refusing most of the IV fluids.  Will consult medicine for admission.  Procedures  Final Clinical Impressions(s) / ED Diagnoses     ICD-10-CM   1. Syncope and collapse  R55     2. Dehydration  E86.0     3. AKI (acute kidney injury) (HCC)  N17.9     4. Catatonic schizophrenia (HCC)  F20.2     5. Non-traumatic rhabdomyolysis  M62.82       ED Discharge Orders     None       Discharge Instructions   None     Elmer Sow. Pilar Plate, MD [facility] Emergency Medicine [facility] [email or web address]    Sabas Sous, MD 11/08/20 2202    Sabas Sous, MD 11/08/20 [phone]

## 2020-11-08 NOTE — ED Notes (Signed)
Pt eating lunch at this time

## 2020-11-08 NOTE — ED Provider Notes (Signed)
Contacted by RN who reports that patient agitated screaming at nursing station.  Patient is found to be hyponatremic by Banker.  Will place order for 10 mg Geodon due to agitation.         Haskel Schroeder, PA-C 11/08/20 1143    Cathren Laine, MD 11/13/20 [phone]

## 2020-11-09 DIAGNOSIS — R55 Syncope and collapse: Secondary | ICD-10-CM | POA: Diagnosis not present

## 2020-11-09 MED ORDER — ZIPRASIDONE MESYLATE 20 MG IM SOLR
10.0000 mg | Freq: Once | INTRAMUSCULAR | Status: AC
  Administered 2020-11-09: 10 mg via INTRAMUSCULAR
  Filled 2020-11-09: qty 20

## 2020-11-09 MED ORDER — DIPHENHYDRAMINE HCL 50 MG/ML IJ SOLN
50.0000 mg | Freq: Four times a day (QID) | INTRAMUSCULAR | Status: DC | PRN
  Administered 2020-11-09: 50 mg via INTRAMUSCULAR
  Filled 2020-11-09 (×2): qty 1

## 2020-11-09 MED ORDER — LORAZEPAM 2 MG/ML IJ SOLN
2.0000 mg | Freq: Four times a day (QID) | INTRAMUSCULAR | Status: DC | PRN
  Administered 2020-11-09: 2 mg via INTRAMUSCULAR
  Filled 2020-11-09 (×2): qty 1

## 2020-11-09 MED ORDER — HALOPERIDOL LACTATE 5 MG/ML IJ SOLN
5.0000 mg | Freq: Four times a day (QID) | INTRAMUSCULAR | Status: DC | PRN
  Administered 2020-11-09: 5 mg via INTRAMUSCULAR
  Filled 2020-11-09 (×2): qty 1

## 2020-11-09 NOTE — ED Notes (Signed)
Noah Ramsey called to inquire about the patient or to have patient call her. Patient was informed that his mother called and wanted him to call her. He said he doesn't want to call her. Patient was asked if this RN could talk to his mother about him and he agreed. Patient's mother was informed that patient will be admitted to [facility] tomorrow after 8am. Mother was given the phone number to call tomorrow.

## 2020-11-09 NOTE — Progress Notes (Signed)
CSW spoke with ED Rns regarding anticipating disposition. Patient has been admitted into the medical floor from the ED. ED RN notified [facility] to let them know the Pt is no longer medically cleared, therefore disposition into an [facility] inpatient facility is on hold.    Damita Dunnings, MSW, LCSW-A  1:24 PM 11/09/2020

## 2020-11-09 NOTE — Progress Notes (Signed)
Patient has been denied by [facility] and has been faxed out. Patient meets inpatient criteria per [facility],NP. Patient referred to the following facilities:  [facility]  [address] [place] [address] [phone]  [facility]  [address] [place] [address] [phone]  [facility]  [address], [place] [address] [phone]  [facility]  [address] [place] [address] [phone]  [facility]  [address] [place], [place] [address] [phone]  [facility]  [address] [place], [place] [address] [phone]  [facility]  [address] [place] [address] [phone]  [facility]  [address] [place] [address] [phone]  [facility]  [address], [place] [address] [phone]    CSW will continue to monitor disposition.    Damita Dunnings, MSW, LCSW-A  8:43 AM 11/09/2020

## 2020-11-09 NOTE — ED Notes (Signed)
Breakfast Ordered 

## 2020-11-09 NOTE — ED Notes (Signed)
Tarl Cephas mother [phone] would like an update on the patient

## 2020-11-09 NOTE — ED Notes (Signed)
Patient is angry with whatever stimulus that he is interacting with. Points his middle finger at the stimulus and is yelling. This RN approached the patient's room and the patient backed away from the door. I asked the patient if I could give him medication to help him calm down. He smiled and politely said, "No. I'm ok. I don't think I need any medicine." Patient continued to engage in his conversation with unseen person, but is no longer yelling or pointing his finger.

## 2020-11-09 NOTE — ED Notes (Signed)
Patient given emergency IM med with assistance of Security to hold patient; Patient fought slightly but once meds was administered patient stop fighting; Patient now sitting on bed talking to himself and occasionally swinging at the air; Staffing was asked for male sitter but none available at the moment; Security remain in unit for safety at this time-Monique,RN

## 2020-11-09 NOTE — ED Notes (Signed)
Patient is constantly pacing in room and responding to some internal stimulus. Cooperative with and ambulates to and from bathroom with steady independent gait.

## 2020-11-09 NOTE — Progress Notes (Signed)
Patient has been medically cleared by ED provider and is scheduled to discharge and transfer to [facility].    Damita Dunnings, MSW, LCSW-A  3:25 PM 11/09/2020

## 2020-11-09 NOTE — Progress Notes (Signed)
Pt accepted to [facility]     Patient meets inpatient criteria per Assunta Found, NP.     Dr. Estill Cotta is the attending provider.    Call report to [phone]  Floyde Parkins, RN @ [facility] ED notified.     Pt scheduled  to arrive at [facility] tomorrow after 0800.   Damita Dunnings, MSW, LCSW-A  10:28 AM 11/09/2020

## 2020-11-09 NOTE — ED Notes (Signed)
Patient started to escalated in behavior and would not return to room nor place mask on for his safety; Patient stood in doorway for about 10 minutes posturing and staring at staff; Security in unit for support and EDP notified of need for emergency IM meds; Patient was observed having words with another patient in unit-Monique,RN

## 2020-11-09 NOTE — ED Notes (Signed)
Patient refused blood draw and says he doesn't want IV fluids. Patient is pleasant and smiles. Maintains eye contact with this RN during conversation. Patient was informed that he will be admitted to the hospital for medical problems and will need IV fluids. Patient says, " that's not true, I don't want anymore of that stuff".

## 2020-11-09 NOTE — ED Notes (Signed)
Patient is talking to himself and laughing. Has not sat down since beginning of shift. No change in behavior.

## 2020-11-09 NOTE — Progress Notes (Signed)
CK lab ordered per Dr. Lucianne Muss to reassess elevated CK level of 8, 677. Patient has a documented hx of Rhabdomyolysis.

## 2020-11-10 NOTE — ED Notes (Signed)
[facility] arrived to transport patient.

## 2020-11-10 NOTE — ED Notes (Signed)
Attempted to call report to [facility]. Called several numbers with no answer. No option to leave voicemail is present.

## 2020-11-10 NOTE — ED Notes (Signed)
[facility] here to transport pt to [facility], EMTALA completed and printed, given to [facility]

## 2020-11-10 NOTE — ED Notes (Signed)
Called [facility] for transport for patient.

## 2020-11-29 ENCOUNTER — Other Ambulatory Visit: Payer: Self-pay

## 2020-11-29 ENCOUNTER — Emergency Department (HOSPITAL_COMMUNITY)
Admission: EM | Admit: 2020-11-29 | Discharge: 2020-12-03 | Disposition: A | Discharge status: Home / Self Care | Payer: 59 | Attending: Emergency Medicine | Admitting: Emergency Medicine

## 2020-11-29 DIAGNOSIS — R4689 Other symptoms and signs involving appearance and behavior: Secondary | ICD-10-CM | POA: Diagnosis present

## 2020-11-29 DIAGNOSIS — Z79899 Other long term (current) drug therapy: Secondary | ICD-10-CM | POA: Diagnosis not present

## 2020-11-29 DIAGNOSIS — F202 Catatonic schizophrenia: Secondary | ICD-10-CM | POA: Diagnosis not present

## 2020-11-29 DIAGNOSIS — R443 Hallucinations, unspecified: Secondary | ICD-10-CM

## 2020-11-29 DIAGNOSIS — Z20822 Contact with and (suspected) exposure to covid-19: Secondary | ICD-10-CM | POA: Insufficient documentation

## 2020-11-29 DIAGNOSIS — R456 Violent behavior: Secondary | ICD-10-CM | POA: Insufficient documentation

## 2020-11-29 DIAGNOSIS — R45851 Suicidal ideations: Secondary | ICD-10-CM | POA: Diagnosis present

## 2020-11-29 DIAGNOSIS — R45850 Homicidal ideations: Secondary | ICD-10-CM | POA: Diagnosis not present

## 2020-11-29 DIAGNOSIS — R739 Hyperglycemia, unspecified: Secondary | ICD-10-CM | POA: Diagnosis not present

## 2020-11-29 LAB — CBC WITH DIFFERENTIAL/PLATELET
Abs Immature Granulocytes: 0.01 10*3/uL (ref 0.00–0.07)
Basophils Absolute: 0 10*3/uL (ref 0.0–0.1)
Basophils Relative: 0 %
Eosinophils Absolute: 0.1 10*3/uL (ref 0.0–0.5)
Eosinophils Relative: 2 %
HCT: 42.2 % (ref 39.0–52.0)
Hemoglobin: 14.4 g/dL (ref 13.0–17.0)
Immature Granulocytes: 0 %
Lymphocytes Relative: 36 %
Lymphs Abs: 2 10*3/uL (ref 0.7–4.0)
MCH: 31.4 pg (ref 26.0–34.0)
MCHC: 34.1 g/dL (ref 30.0–36.0)
MCV: 91.9 fL (ref 80.0–100.0)
Monocytes Absolute: 0.4 10*3/uL (ref 0.1–1.0)
Monocytes Relative: 8 %
Neutro Abs: 3 10*3/uL (ref 1.7–7.7)
Neutrophils Relative %: 54 %
Platelets: 319 10*3/uL (ref 150–400)
RBC: 4.59 MIL/uL (ref 4.22–5.81)
RDW: 13.8 % (ref 11.5–15.5)
WBC: 5.5 10*3/uL (ref 4.0–10.5)
nRBC: 0 % (ref 0.0–0.2)

## 2020-11-29 LAB — SALICYLATE LEVEL: Salicylate Lvl: <7 mg/dL — ABNORMAL LOW (ref 7.0–30.0)

## 2020-11-29 LAB — RAPID URINE DRUG SCREEN, HOSP PERFORMED
Amphetamines: NOT DETECTED
Barbiturates: NOT DETECTED
Benzodiazepines: NOT DETECTED
Cocaine: NOT DETECTED
Opiates: NOT DETECTED
Tetrahydrocannabinol: NOT DETECTED

## 2020-11-29 LAB — COMPREHENSIVE METABOLIC PANEL
ALT: 54 U/L — ABNORMAL HIGH (ref 0–44)
AST: 57 U/L — ABNORMAL HIGH (ref 15–41)
Albumin: 4 g/dL (ref 3.5–5.0)
Alkaline Phosphatase: 101 U/L (ref 38–126)
Anion gap: 9 (ref 5–15)
BUN: 14 mg/dL (ref 6–20)
CO2: 22 mmol/L (ref 22–32)
Calcium: 9.6 mg/dL (ref 8.9–10.3)
Chloride: 105 mmol/L (ref 98–111)
Creatinine, Ser: 0.99 mg/dL (ref 0.61–1.24)
GFR, Estimated: >60 mL/min (ref >60)
Glucose, Bld: 85 mg/dL (ref 70–99)
Potassium: 4 mmol/L (ref 3.5–5.1)
Sodium: 136 mmol/L (ref 135–145)
Total Bilirubin: 0.5 mg/dL (ref 0.3–1.2)
Total Protein: 7.7 g/dL (ref 6.5–8.1)

## 2020-11-29 LAB — ACETAMINOPHEN LEVEL: Acetaminophen (Tylenol), Serum: <10 ug/mL — ABNORMAL LOW (ref 10–30)

## 2020-11-29 LAB — ETHANOL: Alcohol, Ethyl (B): <10 mg/dL (ref <10)

## 2020-11-29 NOTE — ED Provider Notes (Signed)
10:50 PM Poison control recommending 4 hour telemetry observation. Convey to RN that amount ingested is "subtoxic", but unlikely to result in any life threatening or emergent outcome. Encouraged PO fluid intake.   Antony Madura, PA-C 11/29/20 2252    Sloan Leiter, DO 11/30/20 0028
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
## 2020-11-29 NOTE — ED Notes (Signed)
PA Tresa Endo aware of poison control's recommendation.

## 2020-11-29 NOTE — ED Notes (Signed)
Poison control aware. [phone]
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
## 2020-11-29 NOTE — ED Notes (Signed)
Per poison control - pt is subtoxic. Encouraged to drink fluids. Will fax guidelines over.

## 2020-11-30 LAB — RESP PANEL BY RT-PCR (FLU A&B, COVID) ARPGX2
Influenza A by PCR: NEGATIVE
Influenza B by PCR: NEGATIVE
SARS Coronavirus 2 by RT PCR: NEGATIVE

## 2020-11-30 MED ORDER — METFORMIN HCL 500 MG PO TABS
500.0000 mg | ORAL_TABLET | Freq: Two times a day (BID) | ORAL | Status: DC
  Administered 2020-12-01 – 2020-12-03 (×6): 500 mg via ORAL
  Filled 2020-11-30 (×6): qty 1

## 2020-11-30 MED ORDER — MELATONIN 5 MG PO TABS
5.0000 mg | ORAL_TABLET | Freq: Once | ORAL | Status: AC
  Administered 2020-11-30: 5 mg via ORAL
  Filled 2020-11-30: qty 1

## 2020-11-30 MED ORDER — PROPRANOLOL HCL 40 MG PO TABS
40.0000 mg | ORAL_TABLET | Freq: Three times a day (TID) | ORAL | Status: DC
  Administered 2020-11-30 – 2020-12-03 (×10): 40 mg via ORAL
  Filled 2020-11-30 (×10): qty 1

## 2020-11-30 MED ORDER — DIAZEPAM 5 MG PO TABS
5.0000 mg | ORAL_TABLET | Freq: Two times a day (BID) | ORAL | Status: DC | PRN
  Administered 2020-11-30 – 2020-12-02 (×4): 5 mg via ORAL
  Filled 2020-11-30 (×4): qty 1

## 2020-11-30 MED ORDER — HALOPERIDOL 5 MG PO TABS
10.0000 mg | ORAL_TABLET | Freq: Every day | ORAL | Status: DC
  Administered 2020-11-30 – 2020-12-01 (×2): 10 mg via ORAL
  Filled 2020-11-30 (×2): qty 2

## 2020-11-30 NOTE — ED Notes (Signed)
Spoke with provider reference pt request for melatonin

## 2020-11-30 NOTE — ED Notes (Signed)
At nurses station attempting to talk to security at this time. Advised pt it was time to start getting ready for bed

## 2020-11-30 NOTE — ED Notes (Signed)
Pt continues to ambulate in room talking to himself with his shirt off

## 2020-11-30 NOTE — ED Notes (Signed)
Ambulated to restroom  

## 2020-11-30 NOTE — Progress Notes (Signed)
Patient information has been sent to [facility] via secure chat to review for potential admission. Patient meets inpatient criteria per [name], PA-C.   Situation ongoing, CSW will continue to monitor progress.    Signed:  Damita Dunnings, MSW, LCSW-A  11/30/2020 10:28 AM

## 2020-11-30 NOTE — ED Notes (Signed)
Pt up to nursing desk apologizing to security guard for saying the things that he said to her

## 2020-11-30 NOTE — ED Notes (Signed)
Pt escorted back to room by security.  

## 2020-11-30 NOTE — ED Notes (Signed)
Pt back up to nurses station attempting to talk to security. Attempting to redirect pt back to room to get ready to go to sleep. Pt states that he is a grown man why does he have to go to sleep. Pt advised that he doesn't have to go to sleep but he at least needs to go to his room and stay there and be quiet so that other people can sleep

## 2020-11-30 NOTE — ED Notes (Signed)
Pt back up to nurses station at this time talking to security and security escorted him back to his room

## 2020-11-30 NOTE — ED Notes (Signed)
Pt is asking for a sandwich and something to drink and states that he needs it soon

## 2020-11-30 NOTE — ED Notes (Signed)
Pt up to nurses station and asking where his guns and knifes are. Advised pt that they were not here that they had to be at home because they were not allowed here. Pt went back into his room

## 2020-11-30 NOTE — BH Assessment (Signed)
Clinician message Antony Madura, PA-C and noted the pt will be medically cleared at 0300.  TTS to assess the pt once medically cleared.    Redmond Pulling, MS, [facility] Triage Specialist [phone]

## 2020-11-30 NOTE — ED Notes (Signed)
Ivc paperwork complete ,3 copies given to nurse,1 copy medical records, original medical records

## 2020-11-30 NOTE — ED Notes (Signed)
Ambulated to restroom. Requesting a sandwich and drink advised couldn't give him a sandwich but could give crackers and peanut butter

## 2020-11-30 NOTE — ED Notes (Signed)
Pt up ambulating in room talking to himself and has now taken his shirt off

## 2020-11-30 NOTE — ED Notes (Signed)
Pt provided a pillow, warm blankets, and pt is now asking for melatonin to help him sleep advised would need to speak with provider reference same

## 2020-11-30 NOTE — ED Notes (Signed)
Returned from restroom and noted pt talking to himself in the room at this time

## 2020-11-30 NOTE — ED Notes (Signed)
Received verbal report from Andrew F RN at this time 

## 2020-11-30 NOTE — ED Notes (Signed)
Up to nurses station talking to security guard asking if she was the boss of him. When advised no that I was the nurse in charge pt started laughing and ambulated back to his room

## 2020-11-30 NOTE — ED Notes (Signed)
Pt up at nurses station talking to security at this time

## 2020-11-30 NOTE — ED Notes (Signed)
Advised at this time that pt belongings are MIA at this time that they have been unable to locate them

## 2020-11-30 NOTE — ED Notes (Signed)
Patient was sent to room 49 without patient belongings charted. Prior nurse only had the patient for 45 minutes and only knew what the IVC paperwork said. Patient belongings are MIA at this time.

## 2020-11-30 NOTE — ED Notes (Signed)
Pt ambulated to the rest room at this time. 

## 2020-11-30 NOTE — ED Notes (Signed)
Attempted to reach mother Ricky Gallery at this time to determine if she has pt belongings. Generic voicemail left at this time to return call

## 2020-11-30 NOTE — BH Assessment (Addendum)
Comprehensive Clinical Assessment (CCA) Note  11/30/2020 [name] [id number]  Discharge Disposition: [name], PA, reviewed pt's chart and information and determined pt meets inpatient criteria. Pt's referral information was relayed to [name], RN, at [phone] for review and potential placement. There is currently no appropriate bed for pt at this time; pt will be re-assessed for placement during bed meeting. This information was relayed to pt's team at 0439.  The patient demonstrates the following risk factors for suicide: Chronic risk factors for suicide include: psychiatric disorder of Schizophrenia and demographic factors (male, >41 y/o). Acute risk factors for suicide include: family or marital conflict, social withdrawal/isolation, and recent discharge from inpatient psychiatry. Protective factors for this patient include: positive social support and coping skills. Considering these factors, the overall suicide risk at this point appears to be low. Patient is not appropriate for outpatient follow up.  Therefore, a tele-sitter is recommended for suicide precautions.  Flowsheet Row ED from 11/29/2020 in [facility] EMERGENCY DEPARTMENT Most recent reading at 11/30/2020  5:09 AM ED from 11/07/2020 in [facility] EMERGENCY DEPARTMENT Most recent reading at 11/08/2020 12:45 PM ED from 11/07/2020 in [facility] Most recent reading at 11/07/2020  6:54 PM  C-SSRS RISK CATEGORY Low Risk Low Risk No Risk     Chief Complaint:  Chief Complaint  Patient presents with   Suicidal   Homicidal   Visit Diagnosis: F20.9, Schizophrenia   CCA Screening, Triage and Referral (STR) Noah Ramsey is a 25 year old patient who was brought to [facility] via his mother due to pt not sleeping for 5 days, increased aggression, and threats towards himself and others. Pt states he came to the hospital because, "I feel like I was harassed by my  so-called step-father. 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 I overdose on medication on every minute." Pt is able to identify that he was recently d/c from [name], stating he was there, "because people keep pissing me off." When asked if he has a plan to kill himself, pt states, "I have immortality from the [place]. [I would] overdose on medicine."  Pt denies HI, AVH, NSSIB, access to guns/weapons, or engagement with the legal system. Pt states he no longer uses substances; when asked how long it has been since pt used substances, he states, "not in 1 million years."  Pt's orientation and memory is UTA. Pt was friendly and cooperative throughout the assessment process. Pt's insight, judgement, and impulse control is poor at this time.  Patient Reported Information How did you hear about [place]? Family/Friend  What Is the Reason for Your Visit/Call Today? Pt states his mother brought him to the hospital today; PA note states, "Mom states that patient was recently admitted to [facility]. He has been discharged for about a week and she states that she feels like he was discharged too soon. He has  threatened to harm himself and has become violent at home. Today he threw his dinner plate on the ground and punched a wall. Patient states that his name is Noah Ramsey who is an [place] god. He reports SI and HI earlier today but states that this is currently  resolved."  How Long Has This Been Causing You Problems? 1 wk - 1 month  What Do You Feel Would Help You the Most Today? Treatment for Depression or other mood problem; Medication(s)   Have You Recently Had Any Thoughts About Hurting Yourself? Yes  Are You Planning to Commit Suicide/Harm Yourself At This time? Yes   Have you Recently Had Thoughts About Hurting Someone [name]? No  Are You Planning to Harm Someone at This Time? No  Explanation: No data recorded  Have You Used Any Alcohol or Drugs in the Past 24 Hours? No  How Long Ago Did You Use Drugs or Alcohol? No data recorded What Did You Use and How Much? No data recorded  Do You Currently Have a Therapist/Psychiatrist? Yes  Name of Therapist/Psychiatrist: Clinician was unable to identify the name pt provided.   Have You Been Recently Discharged From Any Office Practice or Programs? Yes  Explanation of Discharge From Practice/Program: Pt was reportedly [facility] approximately 1 week ago.     CCA Screening Triage Referral Assessment Type of Contact: Tele-Assessment  Telemedicine Service Delivery: Telemedicine service delivery: This service was provided via telemedicine using a 2-way, interactive audio and video technology  Is this Initial or Reassessment? Initial Assessment  Date Telepsych consult ordered in [facility]:  11/29/20  [facility]  [facility]  [facility]  [facility]   Collateral Involvement: None at this time   Does Patient Have a Automotive engineerCourt Appointed Legal Guardian? No data recorded Name and Contact of Legal Guardian: No data recorded If Minor and Not Living with Parent(s), Who has Custody? N/A  Is CPS involved or ever been involved? Never  Is APS involved or ever been involved? Never   Patient Determined To Be At Risk for Harm To Self or Others Based on Review of Patient Reported Information or Presenting  Complaint? Yes, for Self-Harm  Method: No data recorded Availability of Means: No data recorded Intent: No data recorded Notification Required: No data recorded Additional Information for Danger to Others Potential: No data recorded Additional Comments for Danger to Others Potential: No data recorded Are There Guns or Other Weapons in Your Home? No data recorded Types of Guns/Weapons: No data recorded Are These Weapons Safely Secured?                            No data recorded Who Could Verify You Are Able To Have These Secured: No data recorded Do You Have any Outstanding Charges, Pending Court Dates, Parole/Probation? No data recorded Contacted To Inform of Risk of Harm To Self or Others: Family/Significant Other: (Pt's family is aware)    Does Patient Present under Involuntary Commitment? Yes  IVC Papers Initial File Date: 11/29/20   [place]   Patient Currently Receiving the Following Services: Medication Management   Determination of Need: Emergent (2 hours)   Options For Referral: Inpatient Hospitalization; Medication Management; Outpatient Therapy     CCA Biopsychosocial Patient Reported Schizophrenia/Schizoaffective Diagnosis in Past: Yes   Strengths: Pt is able to express this thoughts and feelings in an  effective manner. Pt openly answers the questions posed. He shares that he cares about his family.   Mental Health Symptoms Depression:   None   Duration of Depressive symptoms:    Mania:   Change in energy/activity; Irritability   Anxiety:    Worrying; Tension   Psychosis:   Delusions   Duration of Psychotic symptoms:  Duration of Psychotic Symptoms: Greater than six months   Trauma:   None   Obsessions:   None   Compulsions:   None   Inattention:   None   Hyperactivity/Impulsivity:   None   Oppositional/Defiant Behaviors:   None   Emotional Irregularity:   Potentially harmful impulsivity; Mood lability;  Intense/inappropriate anger   Other Mood/Personality Symptoms:   None noted    Mental Status Exam Appearance and self-care  Stature:   Tall   Weight:   Obese   Clothing:   Age-appropriate   Grooming:   Neglected   Cosmetic use:   None   Posture/gait:   Normal   Motor activity:   Not Remarkable   Sensorium  Attention:   Normal   Concentration:   Scattered   Orientation:   -- (UTA)   Recall/memory:   -- (UTA)   Affect and Mood  Affect:   Labile   Mood:   Hypomania   Relating  Eye contact:   Normal   Facial expression:   Responsive   Attitude toward examiner:   Cooperative   Thought and Language  Speech flow:  Pressured   Thought content:   Persecutions; Delusions   Preoccupation:   None   Hallucinations:   None   Organization:  No data recorded  [employer] of Knowledge:   -- (UTA)   Intelligence:   Average   Abstraction:   Abstract   Judgement:   Impaired   Reality Testing:   Distorted   Insight:   Lacking   Decision Making:   Confused   Social Functioning  Social Maturity:   Responsible   Social Judgement:   -- (UTA)   Stress  Stressors:   Illness   Coping Ability:   Overwhelmed; Exhausted; Deficient supports   Skill Deficits:   Decision making; Self-control   Supports:   Family; Friends/Service system     Religion: Religion/Spirituality Are You A Religious Person?:  (Not assessed) How Might This Affect Treatment?: Not assessed  Leisure/Recreation: Leisure / Recreation Do You Have Hobbies?:  (Not assessed)  Exercise/Diet: Exercise/Diet Do You Exercise?:  (Not assessed) Have You Gained or Lost A Significant Amount of Weight in the Past Six Months?:  (Not assessed) Do You Follow a Special Diet?:  (Not assessed) Do You Have Any Trouble Sleeping?: Yes Explanation of Sleeping Difficulties: Pt's mother reports he hasn't slept in 5 days.   CCA  Employment/Education Employment/Work Situation: Employment / Work Systems developer: On disability Why is Patient on Disability: Mental health How Long has Patient Been on Disability: Unknown Patient's Job has Been Impacted by Current Illness: No Has Patient ever Been in the [employer]?: No  Education: Education Is Patient Currently Attending School?: No Last Grade Completed:  (UTA) Did You Attend College?:  (UTA) Did You Have An Individualized Education Program (IIEP):  (UTA) Did You Have Any Difficulty At School?:  (UTA) Patient's Education Has Been Impacted by Current Illness:  (UTA)   CCA Family/Childhood History Family and Relationship History: Family history Marital status: Single Does patient have children?:  (Pt states he has "billions" of  children)  Childhood History:  Childhood History By whom was/is the patient raised?: Mother Did patient suffer any verbal/emotional/physical/sexual abuse as a child?:  (UTA) Did patient suffer from severe childhood neglect?:  (UTA) Has patient ever been sexually abused/assaulted/raped as an adolescent or adult?:  (UTA) Was the patient ever a victim of a crime or a disaster?:  (UTA) Witnessed domestic violence?:  (UTA) Has patient been affected by domestic violence as an adult?:  Industrial/product designer)  Child/Adolescent Assessment:     CCA Substance Use Alcohol/Drug Use: Alcohol / Drug Use Pain Medications: See MAR Prescriptions: See MAR Over the Counter: See MAR History of alcohol / drug use?: No history of alcohol / drug abuse Longest period of sobriety (when/how long): N/A Negative Consequences of Use:  (N/A) Withdrawal Symptoms:  (N/A)                         ASAM's:  Six Dimensions of Multidimensional Assessment  Dimension 1:  Acute Intoxication and/or Withdrawal Potential:      Dimension 2:  Biomedical Conditions and Complications:      Dimension 3:  Emotional, Behavioral, or Cognitive Conditions and  Complications:     Dimension 4:  Readiness to Change:     Dimension 5:  Relapse, Continued use, or Continued Problem Potential:     Dimension 6:  Recovery/Living Environment:     ASAM Severity Score:    ASAM Recommended Level of Treatment: ASAM Recommended Level of Treatment:  (N/A)   Substance use Disorder (SUD) Substance Use Disorder (SUD)  Checklist Symptoms of Substance Use:  (N/A)  Recommendations for Services/Supports/Treatments: Recommendations for Services/Supports/Treatments Recommendations For Services/Supports/Treatments: Inpatient Hospitalization, Individual Therapy, Medication Management  Discharge Disposition: [name], PA, reviewed pt's chart and information and determined pt meets inpatient criteria. Pt's referral information was relayed to [facility], RN, at [phone] for review and potential placement. There is currently no appropriate bed for pt at this time; pt will be re-assessed for placement during bed meeting. This information was relayed to pt's team at 0439.  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

## 2020-11-30 NOTE — ED Notes (Signed)
This RN informed that [name], PA, reviewed pt's chart and information and determined pt meets inpatient criteria. Pt's referral information was relayed to [facility], RN, at [phone] for review and potential placement. There is currently no appropriate bed for pt at this time; pt will be re-assessed for placement during bed meeting.

## 2020-11-30 NOTE — BH Assessment (Addendum)
Disposition:   Upon chart review, "CSW followed up with [facility] via secured chat regarding BH review and is waiting to hear back.  @1226 , Disposition Counselor followed up with [facility] (Tosin, RN) regarding [facility] review and continues to wait to hear back.   @2255 , [facility] (Tosin, RN), responded to Disposition Counselor and stated that patient is under review [facility] to [facility].   @2356 , (Tosin, RN) confirmed no beds available however [facility] can review in the morning.   @2357 , Patient faxed out to multiple facilities for consideration of bed placement. See hospital listed below:  Destination Service Provider Request Status Selected Services Address Phone Fax Patient Preferred  [facility]  Pending - Request Sent N/A [address], [place] [facility] [phone] --  [facility]  Pending - Request Sent N/A [address], [place] [phone] [facility] [phone] --  [facility]  Pending - Request Sent N/A [address] [place] [phone] [facility] [phone] --  [facility]  Pending - Request Sent N/A [address] [address] [phone] --  [facility]  Pending - Request Sent N/A [address] [employer], [place] [facility] [address] [phone] --  [facility]  Pending - Request Sent N/A [facility] [address] [phone] [facility] [phone] --  [facility]  Pending - Request Sent N/A [phone] [place] [facility] 2637 [phone] --  [facility]  Pending - Request Sent N/A [address] [place], [phone] [facility] [address] [phone] --  [facility]  Pending - Request Sent N/A [address] [place] [phone] [facility] [phone] --  [facility]  Pending - Request Sent N/A 3019 [phone] [place] [facility]  [phone] --  [facility]  Pending - Request Sent N/A [address] [place] [phone] [facility] [phone] --  [facility]  Pending - Request Sent N/A [address], [place] [phone] --  [facility]  Pending - Request Sent N/A [address] [place] [phone] [facility] [phone] --  [facility]  Pending - Request Sent N/A [address], [place] [phone] [facility] [phone] --  [facility]  Pending - Request Sent N/A [address] [phone] [facility] [address] [phone] --  [facility]  Pending - Request Sent N/A [address] [place], [place] [phone] [address] [phone] --  [facility]  Pending - Request Sent N/A [address] [place] [phone] [facility] [phone] --  [facility]  Pending - Request Sent N/A [address] [place] [phone] [facility] [phone] --

## 2020-11-30 NOTE — ED Provider Notes (Signed)
[facility] EMERGENCY DEPARTMENT Provider Note   CSN: [id number] Arrival date & time: 11/29/20  2137     History Chief Complaint  Patient presents with   Suicidal   Homicidal    Noah Ramsey is a 25 y.o. male.  Patient to ED with his mother, now under IVC for violent outbursts and aggressive behavior. History of schizophrenia, recent admission with discharge home to mother's care about one week ago. Per IVC, the patient is reporting SI, threatens to harm himself and is violent at home, throwing plates of food and punching a wall. She also reported to staff on arrival that he has not slept in 5 days so she gave 120 mg propranolol instead of his usual 40 mg in an effort to get him to sleep.   The history is provided by the patient. No language interpreter was used.      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     Family History  Problem Relation Age of Onset   Mental illness Brother     Social History   Tobacco Use   Smoking status: Never   Smokeless tobacco: Never  Substance Use Topics   Alcohol use: Never   Drug use: Never    Home Medications Prior to Admission medications   Medication Sig Start Date End Date Taking? Authorizing Provider  haloperidol (HALDOL) 10 MG tablet Take 10 mg by mouth at bedtime. Patient not taking: Reported on 11/08/2020    [provider]  haloperidol decanoate (HALDOL DECANOATE) 100 MG/ML injection Inject 100 mg into the muscle every 28 (twenty-eight) days. Patient not taking: No sig reported     [provider]  LATUDA 40 MG TABS tablet Take 40 mg by mouth at bedtime. 11/03/20   [provider]  metFORMIN (GLUCOPHAGE) 500 MG tablet Take 1 tablet (500 mg total) by mouth 2 (two) times daily with a meal. Patient not taking: Reported on 11/08/2020 01/18/20 04/17/20  Georgina Quint, MD  OVER THE COUNTER MEDICATION Ripped Fat Burner (diet pill)    [provider]  OVER THE COUNTER MEDICATION Yohimbine (diet pill)    [provider]  propranolol (INDERAL) 20 MG tablet Take 20 mg by mouth 3 (three) times daily. Patient not taking: Reported on 11/08/2020 10/09/20   [provider]    Allergies    Patient has no known allergies.  Review of Systems   Review of Systems  Unable to perform ROS: Psychiatric disorder   Physical Exam Updated Vital Signs BP 139/89 (BP Location: Left Arm)   Pulse 82   Temp 98.4 F (36.9 C)   Resp 20   Ht 5\' 7"  (1.702 m)   Wt (!) 143 kg   SpO2 97%   BMI 49.38 kg/m   Physical Exam Vitals and nursing note reviewed.  Constitutional:      Appearance: He is well-developed.  HENT:     Head: Normocephalic.  Cardiovascular:     Rate and Rhythm: Normal rate and regular rhythm.  Heart sounds: No murmur heard. Pulmonary:     Effort: Pulmonary effort is normal.     Breath sounds: Normal breath sounds. No wheezing, rhonchi or rales.  Abdominal:     General: Bowel sounds are normal.     Palpations: Abdomen is soft.     Tenderness: There is no abdominal tenderness. There is no guarding or rebound.  Musculoskeletal:        General: Normal range of motion.     Cervical back: Normal range of motion and neck supple.  Skin:    General: Skin is warm and dry.  Neurological:     General: No focal deficit present.     Mental Status: He is alert.  Psychiatric:     Comments: The patient is noted to be responding to internal stimuli when observed, in full conversation while lying alone.     ED Results / Procedures  / Treatments   Labs (all labs ordered are listed, but only abnormal results are displayed) Labs Reviewed  COMPREHENSIVE METABOLIC PANEL - Abnormal; Notable for the following components:      Result Value   AST 57 (*)    ALT 54 (*)    All other components within normal limits  ACETAMINOPHEN LEVEL - Abnormal; Notable for the following components:   Acetaminophen (Tylenol), Serum <10 (*)    All other components within normal limits  SALICYLATE LEVEL - Abnormal; Notable for the following components:   Salicylate Lvl <7.0 (*)    All other components within normal limits  RESP PANEL BY RT-PCR (FLU A&B, COVID) ARPGX2  CBC WITH DIFFERENTIAL/PLATELET  RAPID URINE DRUG SCREEN, HOSP PERFORMED  ETHANOL    EKG None  Radiology No results found.  Procedures Procedures   Medications Ordered in ED Medications - No data to display  ED Course  I have reviewed the triage vital signs and the nursing notes.  Pertinent labs & imaging results that were available during my care of the patient were reviewed by me and considered in my medical decision making (see chart for details).    MDM Rules/Calculators/A&P                           Patient to ED, placed under IVC after arrival by provider staff for active psychosis, violence, making statements threatening self harm.  Poison control was consulted regarding the 120 mg Propranolol given as it was triple a normal dose. Recommendation is to monitor for 4 hours but doubtful the dose is harmful.   The patient remains awake. Lying in the bed, obviously responding to internal stimuli. TTS pending.   He is considered medically cleared for psychiatric evaluation.   Per TTS, he meets inpatient criteria. Placement being sought.   Final Clinical Impression(s) / ED Diagnoses Final diagnoses:  None   SI Hallucinations Violent behavior  Rx / DC Orders ED Discharge Orders     None        Danne Harbor 11/30/20 0640    Dione Booze, MD 11/30/20 [phone]

## 2020-11-30 NOTE — BH Assessment (Signed)
Pt's IVC states:  "Patient's mother states he has been aggressive at home and he's not slept in 5 days. He has threatened suicide. He also states he is [place], an [place] god."  Petitioner: Tanda Rockers, DOS [facility]

## 2020-12-01 ENCOUNTER — Encounter (HOSPITAL_COMMUNITY): Payer: Self-pay | Admitting: Registered Nurse

## 2020-12-01 DIAGNOSIS — R4689 Other symptoms and signs involving appearance and behavior: Secondary | ICD-10-CM | POA: Diagnosis present

## 2020-12-01 DIAGNOSIS — F202 Catatonic schizophrenia: Secondary | ICD-10-CM

## 2020-12-01 LAB — CK: Total CK: 1423 U/L — ABNORMAL HIGH (ref 49–397)

## 2020-12-01 MED ORDER — STERILE WATER FOR INJECTION IJ SOLN
INTRAMUSCULAR | Status: AC
  Administered 2020-12-01: 1.2 mL
  Filled 2020-12-01: qty 10

## 2020-12-01 MED ORDER — DIAZEPAM 5 MG PO TABS
5.0000 mg | ORAL_TABLET | Freq: Once | ORAL | Status: AC
  Administered 2020-12-01: 5 mg via ORAL
  Filled 2020-12-01: qty 1

## 2020-12-01 MED ORDER — ZIPRASIDONE MESYLATE 20 MG IM SOLR
20.0000 mg | Freq: Once | INTRAMUSCULAR | Status: AC
  Administered 2020-12-01: 20 mg via INTRAMUSCULAR
  Filled 2020-12-01: qty 20

## 2020-12-01 NOTE — ED Notes (Signed)
Pt up at nurses station at this time. Attempted to get pt back to room at this time and pt will not go back to room pt has been asked multiple time to return back to room and pt is stating no and at this time will not return to the room

## 2020-12-01 NOTE — ED Notes (Signed)
Provider sent a message in reference to pt status and asked if there was any medication that can be given

## 2020-12-01 NOTE — ED Notes (Addendum)
Pt back up to nurses station at this time attempting to talk with security. Pt redirected back to his room at this time. Advised that he was unable to walk back and forth to the nurses station that he needed to stay in his room. Once returned to room pt ambulating in room and talking to himself

## 2020-12-01 NOTE — ED Notes (Signed)
Pt returned to room at this time

## 2020-12-01 NOTE — ED Notes (Signed)
Pt up ambulating to restroom at this time 

## 2020-12-01 NOTE — ED Notes (Signed)
Called security to area at this time. Called doctor reference same and meds ordered

## 2020-12-01 NOTE — ED Notes (Signed)
Pt back up to nurses station. Attempted to redirect pt back to his room and to his bed at this time

## 2020-12-01 NOTE — ED Notes (Signed)
Pt ambulated to restroom. 

## 2020-12-01 NOTE — ED Notes (Signed)
Pt is requesting a toothbrush at this time. Pt provided a toothbrush and toothpaste at this time

## 2020-12-01 NOTE — ED Notes (Signed)
Pt remains in restroom at this time. Knocked on door twice and advised that I needed him to come out so that I could give him some medication

## 2020-12-01 NOTE — ED Notes (Signed)
Pt ambulated to restroom and is up at the nurses station. Pt requesting crackers and peanut butter. Provided same at this time

## 2020-12-01 NOTE — Progress Notes (Signed)
Patient information has been sent to [facility] via secure chat to review for potential admission. Patient meets inpatient criteria per [name], PA-C .   Situation ongoing, CSW will continue to monitor progress.    Signed:  Damita Dunnings, MSW, LCSW-A  12/01/2020 8:34 AM

## 2020-12-01 NOTE — ED Notes (Signed)
Spoke with provider reference pt request at this time

## 2020-12-01 NOTE — ED Notes (Signed)
Pt ambulated to restroom at this time.

## 2020-12-01 NOTE — ED Notes (Signed)
[name] (Mother) of [place] called an ask for an update the number is in the chart. Call with every new info.

## 2020-12-01 NOTE — ED Notes (Signed)
Pt ambulating back to restroom at this time

## 2020-12-01 NOTE — ED Notes (Signed)
Pt is asking for more valium advised would speak with provider reference same

## 2020-12-01 NOTE — ED Notes (Signed)
Security in area at this time. Pt was escorted back to room by security at this time

## 2020-12-01 NOTE — Consult Note (Addendum)
Telepsych Consultation   Reason for Consult:  IVC, aggression, psychosis Referring Physician:  Rayne Du Location of Patient: [facility] ED Location of Provider: Other: [facility]  Patient Identification: Noah Ramsey MRN:  [id number] Principal Diagnosis: Schizophrenia, catatonic type (HCC) Diagnosis:  Principal Problem:   Schizophrenia, catatonic type (HCC) Active Problems:   Aggressive behavior   Total Time spent with patient: 30 minutes  Subjective:   Noah Ramsey is a 25 y.o. male patient admitted to [facility] ED brought in by his mother with complaints the patient had not slept in 5 days, increased aggression, threats towards himself and others.  Patient also having delusional thoughts of being an [place] got or in vampire.Marland Kitchen  HPI:  Noah Ramsey, 25 y.o., male patient seen via tele health by this provider, consulted with Dr. Earlene Plater; and chart reviewed on 12/01/20.  On evaluation Noah Ramsey reports he is not sure of why he is in the hospital.  Patient reports that he lives with his mother, brother, and stepfather.  Patient denies any aggression but states that he gestures like he is going to hit someone which makes him afraid. "I act like I am.  They be scared because I am 300 pounds."  Patient endorses auditory hallucinations.  Stating the voices are evil spirits trying to bother him "They make fun of me and degrade me."  Patient denies suicidal ideation at this time stating "Not anymore" stating the last time he had any suicidal thoughts was "I think 3 days ago."  Patient also denies homicidal ideation stating he was 3 days ago when had not last had any thoughts.  Patient does endorse paranoia.  And then made a statement about having his guns confiscated by the police along with a 30 clip, a 50 clip, and a $3000 laptop computer."  Patient asked if he has spoken with his mother since being in the hospital he stated that he was able to talk to his mother  telepathically male.  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    During evaluation Noah Ramsey is sitting on side of bed eating his breakfast in no acute distress.  He is alert, oriented to self and place.  Patient continues to have some disorganization.  Patient was calm and cooperative throughout assessment.  His mood is labile.  Patient continues to have some delusional thinking along with paranoia and auditory hallucinations.  Currently patient denies suicidal/homicidal ideation.  We will continue to recommend inpatient psychiatric treatment   Past Psychiatric History: Schizophrenia  Risk to Self: Denies Risk to Others:   Prior Inpatient Therapy: Yes Prior Outpatient Therapy: Yes  Past Medical History:  Past Medical History:  Diagnosis Date   Elevated CPK    per patient   Schizophrenia (HCC)    History reviewed. No pertinent surgical history. Family History:  Family History  Problem Relation Age of Onset   Mental illness Brother    Family Psychiatric  History: Patient unaware records indicate a brother with mental health issues Social History:  Social History   Substance and Sexual Activity  Alcohol Use Never     Social History   Substance and Sexual Activity  Drug Use Never    Social History   Socioeconomic History   Marital status: Single    Spouse name: Not on file   Number of children: Not on file   Years of education: Not on file   Highest education level: Not on file  Occupational History   Not on file  Tobacco Use   Smoking status: Never   Smokeless tobacco: Never  Substance and Sexual Activity   Alcohol use: Never   Drug use: Never   Sexual activity: Not on file  Other Topics Concern   Not on file  Social History Narrative   Not on file   Social Determinants of Health   Financial Resource Strain: Not on file  Food Insecurity: Not on file  Transportation Needs: Not on file  Physical  Activity: Not on file  Stress: Not on file  Social Connections: Not on file   Additional Social History:    Allergies:  No Known Allergies  Labs:  Results for orders placed or performed during the hospital encounter of 11/29/20 (from the past 48 hour(s))  Urine rapid drug screen (hosp performed)     Status: None   Collection Time: 11/29/20 10:04 PM  Result Value Ref Range   Opiates NONE DETECTED NONE DETECTED   Cocaine NONE DETECTED NONE DETECTED   Benzodiazepines NONE DETECTED NONE DETECTED   Amphetamines NONE DETECTED NONE DETECTED   Tetrahydrocannabinol NONE DETECTED NONE DETECTED   Barbiturates NONE DETECTED NONE DETECTED    Comment: (NOTE) DRUG SCREEN FOR MEDICAL PURPOSES ONLY.  IF CONFIRMATION IS NEEDED FOR ANY PURPOSE, NOTIFY LAB WITHIN 5 DAYS.  LOWEST DETECTABLE LIMITS FOR URINE DRUG SCREEN Drug Class                     Cutoff (ng/mL) Amphetamine and metabolites    1000 Barbiturate and metabolites    200 Benzodiazepine                 200 Tricyclics and metabolites     300 Opiates and metabolites        300 Cocaine and metabolites        300 THC                            50 Performed at [facility], [address] [place], [place] [address]   Comprehensive metabolic panel     Status: Abnormal   Collection Time: 11/29/20 10:08 PM  Result Value Ref Range   Sodium 136 135 - 145 mmol/L   Potassium 4.0 3.5 - 5.1 mmol/L   Chloride 105 98 - 111 mmol/L   CO2 22 22 - 32 mmol/L   Glucose, Bld 85 70 - 99 mg/dL    Comment: Glucose reference range applies only to samples taken after fasting for at least 8 hours.   BUN 14 6 - 20 mg/dL   Creatinine, Ser 6.04 0.61 - 1.24 mg/dL   Calcium 9.6 8.9 - 54.0 mg/dL   Total Protein 7.7 6.5 - 8.1 g/dL   Albumin 4.0 3.5 - 5.0 g/dL   AST 57 (H) 15 - 41 U/L   ALT 54 (H) 0 - 44 U/L   Alkaline Phosphatase 101 38 - 126 U/L   Total Bilirubin 0.5 0.3 - 1.2 mg/dL   GFR, Estimated >98 >11 mL/min    Comment: (NOTE) Calculated  using the CKD-EPI Creatinine Equation (2021)    Anion gap 9 5 - 15    Comment: Performed at [facility], [address] [place], [place] [address]  CBC with Differential     Status: None   Collection Time: 11/29/20 10:08 PM  Result Value Ref Range   WBC 5.5 4.0 - 10.5  K/uL   RBC 4.59 4.22 - 5.81 MIL/uL   Hemoglobin 14.4 13.0 - 17.0 g/dL   HCT 76.1 95.0 - 93.2 %   MCV 91.9 80.0 - 100.0 fL   MCH 31.4 26.0 - 34.0 pg   MCHC 34.1 30.0 - 36.0 g/dL   RDW 67.1 24.5 - 80.9 %   Platelets 319 150 - 400 K/uL   nRBC 0.0 0.0 - 0.2 %   Neutrophils Relative % 54 %   Neutro Abs 3.0 1.7 - 7.7 K/uL   Lymphocytes Relative 36 %   Lymphs Abs 2.0 0.7 - 4.0 K/uL   Monocytes Relative 8 %   Monocytes Absolute 0.4 0.1 - 1.0 K/uL   Eosinophils Relative 2 %   Eosinophils Absolute 0.1 0.0 - 0.5 K/uL   Basophils Relative 0 %   Basophils Absolute 0.0 0.0 - 0.1 K/uL   Immature Granulocytes 0 %   Abs Immature Granulocytes 0.01 0.00 - 0.07 K/uL    Comment: Performed at [facility], [address], [place], [place] [address]  Ethanol     Status: None   Collection Time: 11/29/20 10:08 PM  Result Value Ref Range   Alcohol, Ethyl (B) <10 <10 mg/dL    Comment: (NOTE) Lowest detectable limit for serum alcohol is 10 mg/dL.  For medical purposes only. Performed at [facility], [address] [place], [place] [address]   Acetaminophen level     Status: Abnormal   Collection Time: 11/29/20 10:08 PM  Result Value Ref Range   Acetaminophen (Tylenol), Serum <10 (L) 10 - 30 ug/mL    Comment: (NOTE) Therapeutic concentrations vary significantly. A range of 10-30 ug/mL  may be an effective concentration for many patients. However, some  are best treated at concentrations outside of this range. Acetaminophen concentrations >150 ug/mL at 4 hours after ingestion  and >50 ug/mL at 12 hours after ingestion are often associated with  toxic reactions.  Performed at [facility], [address] [place], [place] [address]   Salicylate level     Status: Abnormal   Collection Time: 11/29/20 10:08 PM  Result Value Ref Range   Salicylate Lvl <7.0 (L) 7.0 - 30.0 mg/dL    Comment: Performed at [facility], [address] [place], [place] [address]  Resp Panel by RT-PCR (Flu A&B, Covid) Nasopharyngeal Swab     Status: None   Collection Time: 11/29/20 10:54 PM   Specimen: Nasopharyngeal Swab; Nasopharyngeal(NP) swabs in vial transport medium  Result Value Ref Range   SARS Coronavirus 2 by RT PCR NEGATIVE NEGATIVE    Comment: (NOTE) SARS-CoV-2 target nucleic acids are NOT DETECTED.  The SARS-CoV-2 RNA is generally detectable in upper respiratory specimens during the acute phase of infection. The lowest concentration of SARS-CoV-2 viral copies this assay can detect is 138 copies/mL. A negative result does not preclude SARS-Cov-2 infection and should not be used as the sole basis for treatment or other patient management decisions. A negative result may occur with  improper specimen collection/handling, submission of specimen other than nasopharyngeal swab, presence of viral mutation(s) within the areas targeted by this assay, and inadequate number of viral copies(<138 copies/mL). A negative result must be combined with clinical observations, patient history, and epidemiological information. The expected result is Negative.  Fact Sheet for Patients:  BloggerCourse.com  Fact Sheet for Healthcare Providers:  SeriousBroker.it  This test is no t yet approved or cleared by the [place] and  has been authorized for detection and/or diagnosis of SARS-CoV-2 by FDA under an Emergency Use Authorization (EUA). This EUA will remain  in effect (meaning this test can be used) for the duration of the COVID-19 declaration under Section 564(b)(1) of the Act, 21 U.S.C.section 360bbb-3(b)(1), unless the authorization is  terminated  or revoked sooner.       Influenza A by PCR NEGATIVE NEGATIVE   Influenza B by PCR NEGATIVE NEGATIVE    Comment: (NOTE) The Xpert Xpress SARS-CoV-2/FLU/RSV plus assay is intended as an aid in the diagnosis of influenza from Nasopharyngeal swab specimens and should not be used as a sole basis for treatment. Nasal washings and aspirates are unacceptable for Xpert Xpress SARS-CoV-2/FLU/RSV testing.  Fact Sheet for Patients: BloggerCourse.com  Fact Sheet for Healthcare Providers: SeriousBroker.it  This test is not yet approved or cleared by the [place] FDA and has been authorized for detection and/or diagnosis of SARS-CoV-2 by FDA under an Emergency Use Authorization (EUA). This EUA will remain in effect (meaning this test can be used) for the duration of the COVID-19 declaration under Section 564(b)(1) of the Act, 21 U.S.C. section 360bbb-3(b)(1), unless the authorization is terminated or revoked.  Performed at [facility], [address] [place], [place] [address]     Medications:  Current Facility-Administered Medications  Medication Dose Route Frequency Provider Last Rate Last Admin   diazepam (VALIUM) tablet 5 mg  5 mg Oral BID PRN Charlynne Pander, MD   5 mg at 11/30/20 2029   haloperidol (HALDOL) tablet 10 mg  10 mg Oral QHS Charlynne Pander, MD   10 mg at 11/30/20 2029   metFORMIN (GLUCOPHAGE) tablet 500 mg  500 mg Oral BID WC Charlynne Pander, MD   500 mg at 12/01/20 6045   propranolol (INDERAL) tablet 40 mg  40 mg Oral TID Charlynne Pander, MD   40 mg at 12/01/20 4098   Current Outpatient Medications  Medication Sig Dispense Refill   diazepam (VALIUM) 5 MG tablet Take 5 mg by mouth 2 (two) times daily as needed for anxiety.     LATUDA 40 MG TABS tablet Take 40 mg by mouth at bedtime.     OVER THE COUNTER MEDICATION Ripped Fat Burner (diet pill)     OVER THE COUNTER MEDICATION  Yohimbine (diet pill)     propranolol (INDERAL) 40 MG tablet Take 40 mg by mouth 3 (three) times daily.     haloperidol (HALDOL) 10 MG tablet Take 10 mg by mouth at bedtime. (Patient not taking: No sig reported)     haloperidol decanoate (HALDOL DECANOATE) 100 MG/ML injection Inject 100 mg into the muscle every 28 (twenty-eight) days. (Patient not taking: No sig reported)     metFORMIN (GLUCOPHAGE) 500 MG tablet Take 1 tablet (500 mg total) by mouth 2 (two) times daily with a meal. (Patient not taking: No sig reported) 180 tablet 3    Musculoskeletal: Strength & Muscle Tone: within normal limits Gait & Station: normal Patient leans: N/A  Psychiatric Specialty Exam:  Presentation  General Appearance: Appropriate for Environment  Eye Contact:Good  Speech:Clear and Coherent; Slow  Speech Volume:Normal  Handedness:Right   Mood and Affect  Mood:Labile  Affect:Labile   Thought Process  Thought Processes:Coherent; Linear  Descriptions of Associations:Tangential  Orientation:Full (Time, Place and Person)  Thought Content:Tangential  History of Schizophrenia/Schizoaffective disorder:Yes  Duration of Psychotic Symptoms:Greater than six months  Hallucinations:Hallucinations: Auditory Description of Auditory Hallucinations: "The voices are evil, making  fun of me and degrading me"  Ideas of Reference:Paranoia  Suicidal Thoughts:Suicidal Thoughts: No (Not anymore.  Patient reports last time had suicidal thoughts was 3 days ago)  Homicidal Thoughts:Homicidal Thoughts: No (Reporting last thoughts of homicidal ideation was 3 days ago; but never any intent or plan.  Patient also denies physical aggression)   Sensorium  Memory:Immediate Fair; Recent Fair  Judgment:Impaired  Insight:Lacking   Executive Functions  Concentration:Poor  Attention Span:Poor  Recall:Fair  [employer] of Knowledge:Poor  Language:Poor   Psychomotor Activity  Psychomotor Activity:Psychomotor  Activity: Restlessness   Assets  Assets:Communication Skills; Desire for Improvement; Housing; Social Support   Sleep  Sleep:Sleep: Good (Patient stating he is sleeping good)    Physical Exam: Physical Exam Vitals and nursing note reviewed. Exam conducted with a chaperone present.  Constitutional:      General: He is not in acute distress.    Appearance: Normal appearance. He is obese. He is not ill-appearing.  Cardiovascular:     Rate and Rhythm: Normal rate.  Pulmonary:     Effort: Pulmonary effort is normal.  Neurological:     Mental Status: He is alert.     Comments: Oriented to self and place   Psychiatric:        Attention and Perception: He perceives auditory hallucinations.        Mood and Affect: Affect is labile.        Speech: Delayed: Slow.        Behavior: Behavior is cooperative.        Thought Content: Thought content is paranoid and delusional. Thought content includes suicidal ideation. Thought content does not include homicidal ideation.        Judgment: Judgment is impulsive.   Review of Systems  Constitutional: Negative.   HENT: Negative.    Eyes: Negative.   Respiratory: Negative.    Cardiovascular: Negative.   Gastrointestinal: Negative.   Genitourinary: Negative.   Musculoskeletal: Negative.   Skin: Negative.   Neurological: Negative.   Endo/Heme/Allergies: Negative.   Psychiatric/Behavioral:  Positive for depression and hallucinations. Suicidal ideas: Denies at this time.The patient is nervous/anxious. Insomnia: States he slept well last night.  Blood pressure 125/83, pulse 88, temperature (!) 97.5 F (36.4 C), temperature source Oral, resp. rate 17, height 5\' 7"  (1.702 m), weight (!) 143 kg, SpO2 98 %. Body mass index is 49.38 kg/m.  Treatment Plan Summary: Daily contact with patient to assess and evaluate symptoms and progress in treatment, Medication management, and Plan inpatient psychiatric treatment.   Recommendations:  Recommend  administration of benzodiazepines, mood stabilizers, and or other sedating medications. Patient with history of CK >10,000 less than one month ago. Patient remains at increased risk of developing antipsychotic induced rhabdo myalgia. Recommend we hold antipsychotics until CK and EkG are obtained as patient remains at high risk. CK <300 prior to resuming medication.  It is apparent that patient is actively psychotic and manic and currently a danger to himself and others. Will benefit from initiation of Depakote, hydroxyzine, Benadryl or Ativan. Consider restraints if warranted until patient consents to the above procedures.  After labs are obtained can resume home medications. Patient most likely has developed acute exacerbation of schizophrenia as he has been without his Haldol due to CK>10000  Disposition: Recommend psychiatric Inpatient admission when medically cleared.  This service was provided via telemedicine using a 2-way, interactive audio and video technology.  Names of all persons participating in this telemedicine service and their role in this encounter. Name:  Prince Olivier Role: NP  Name: Dr. Earlene PlaterKatherine Laubach Role: Psychiatrist  Name: Noah Ramsey Role: Patient  Name: Vivi FernsAshley Strader, RN Role: Patient's nurse sent a secure message informing: Psychiatric consult complete.  Recommended inpatient psychiatric treatment.  If no available beds at [facility] patient is to be faxed out to surrounding facilities for appropriate bed for inpatient psychiatric treatment.  Please inform MD only default is listed.  Also included above recommendations and secure message.    Noah Peyton, NP 12/01/2020 1:49 PM

## 2020-12-01 NOTE — ED Notes (Signed)
Pt in room laying in bed talking to himself at this time

## 2020-12-01 NOTE — ED Notes (Signed)
Pt returned to room is up ambulating in room will not lay down

## 2020-12-01 NOTE — ED Provider Notes (Signed)
Emergency Medicine Observation Re-evaluation Note  Noah Ramsey is a 25 y.o. male, seen on rounds today.  Pt initially presented to the ED for complaints of Suicidal and Homicidal Currently, the patient is IVC and awaiting behavioral health bed.  Physical Exam  BP 115/65 (BP Location: Left Arm)   Pulse 80   Temp 98 F (36.7 C) (Oral)   Resp 18   Ht 1.702 m (5\' 7" )   Wt (!) 143 kg   SpO2 99%   BMI 49.38 kg/m  Physical Exam General: Well-developed well-nourished male does not appear to be in acute distress Cardiac: Regular rate and rhythm Lungs clear to auscultation Psych: Patient is calmer  ED Course / MDM  EKG:   I have reviewed the labs performed to date as well as medications administered while in observation.  Recent changes in the last 24 hours include patient appears calmer and is awaiting psych recommendations.  Plan  Current plan is for hospitalization behavioral health unit. Noah Ramsey is under involuntary commitment.      [name], MD 12/01/20 [phone]

## 2020-12-01 NOTE — ED Notes (Signed)
Pt back up to nurses station attempting to talk to security pt advised to go back to his room at this time. Pt asked why being so bossy. Pt advised that he needed to return back to his room at this time

## 2020-12-01 NOTE — ED Notes (Signed)
Pt up and down to nurses station talking with security card. Attempted to redirect pt back to his room and pt then ambulated to restroom at this time. Pt is asking for more Valium. Advised would need to speak with provider reference same

## 2020-12-01 NOTE — ED Notes (Signed)
Contacted provider reference pt refusing and having to call security to area for pt and received orders

## 2020-12-01 NOTE — ED Notes (Signed)
Pt ambulated to restroom at this time to brush his teeth

## 2020-12-01 NOTE — Progress Notes (Signed)
Patient has been faxed out due to no beds available at [facility]. Patient meets inpatient criteria per [name], PA-C. Patient referred to the following facilities:  [facility]  [address] [place] [address] [phone]  [facility]  [address] [place] [address] [phone]  [facility]  [address] [place] [address] [phone]  [facility]  [address] [place] [address] [phone]  [facility]  [address] [place], [place] [address] [phone]  [facility] [address] [place] [address] [phone]  [facility]  [address] [place] [address] [phone]  [facility]  [address] [place], [place] [address] [phone]  [facility]  [address] [place] [address] [phone]  [facility]  [address] [place] [address] [phone]  [facility]  [address], [place] [address] [phone]  [facility]  [address], [place] [address] [phone]  [facility]  [address] [place] [address] [phone]  [facility]  [address] [place] [address] [phone]  [facility]  [address] [place] [address] [phone]  [facility]  [address], [place] [address] [phone]  [facility]  [address], [place] [address] [phone]  [facility]  [address], [place] [address] [phone]      CSW will continue to monitor disposition.    Damita Dunnings, MSW, LCSW-A  2:04 PM 12/01/2020

## 2020-12-02 LAB — TROPONIN I (HIGH SENSITIVITY): Troponin I (High Sensitivity): 5 ng/L (ref <18)

## 2020-12-02 LAB — CBC WITH DIFFERENTIAL/PLATELET
Abs Immature Granulocytes: 0.01 10*3/uL (ref 0.00–0.07)
Basophils Absolute: 0 10*3/uL (ref 0.0–0.1)
Basophils Relative: 0 %
Eosinophils Absolute: 0.2 10*3/uL (ref 0.0–0.5)
Eosinophils Relative: 3 %
HCT: 42.7 % (ref 39.0–52.0)
Hemoglobin: 14.6 g/dL (ref 13.0–17.0)
Immature Granulocytes: 0 %
Lymphocytes Relative: 37 %
Lymphs Abs: 2.2 10*3/uL (ref 0.7–4.0)
MCH: 31.8 pg (ref 26.0–34.0)
MCHC: 34.2 g/dL (ref 30.0–36.0)
MCV: 93 fL (ref 80.0–100.0)
Monocytes Absolute: 0.5 10*3/uL (ref 0.1–1.0)
Monocytes Relative: 9 %
Neutro Abs: 2.9 10*3/uL (ref 1.7–7.7)
Neutrophils Relative %: 51 %
Platelets: 300 10*3/uL (ref 150–400)
RBC: 4.59 MIL/uL (ref 4.22–5.81)
RDW: 14.2 % (ref 11.5–15.5)
WBC: 5.8 10*3/uL (ref 4.0–10.5)
nRBC: 0 % (ref 0.0–0.2)

## 2020-12-02 LAB — LIPID PANEL
Cholesterol: 123 mg/dL (ref 0–200)
HDL: 35 mg/dL — ABNORMAL LOW (ref >40)
LDL Cholesterol: 73 mg/dL (ref 0–99)
Total CHOL/HDL Ratio: 3.5 RATIO
Triglycerides: 74 mg/dL (ref <150)
VLDL: 15 mg/dL (ref 0–40)

## 2020-12-02 LAB — COMPREHENSIVE METABOLIC PANEL
ALT: 85 U/L — ABNORMAL HIGH (ref 0–44)
AST: 70 U/L — ABNORMAL HIGH (ref 15–41)
Albumin: 4 g/dL (ref 3.5–5.0)
Alkaline Phosphatase: 97 U/L (ref 38–126)
Anion gap: 8 (ref 5–15)
BUN: 12 mg/dL (ref 6–20)
CO2: 23 mmol/L (ref 22–32)
Calcium: 9.5 mg/dL (ref 8.9–10.3)
Chloride: 107 mmol/L (ref 98–111)
Creatinine, Ser: 1.1 mg/dL (ref 0.61–1.24)
GFR, Estimated: >60 mL/min (ref >60)
Glucose, Bld: 105 mg/dL — ABNORMAL HIGH (ref 70–99)
Potassium: 4.1 mmol/L (ref 3.5–5.1)
Sodium: 138 mmol/L (ref 135–145)
Total Bilirubin: 0.9 mg/dL (ref 0.3–1.2)
Total Protein: 7.7 g/dL (ref 6.5–8.1)

## 2020-12-02 LAB — CK: Total CK: 1646 U/L — ABNORMAL HIGH (ref 49–397)

## 2020-12-02 MED ORDER — OXCARBAZEPINE 300 MG PO TABS
300.0000 mg | ORAL_TABLET | Freq: Two times a day (BID) | ORAL | Status: DC
  Administered 2020-12-02 – 2020-12-03 (×3): 300 mg via ORAL
  Filled 2020-12-02 (×3): qty 1

## 2020-12-02 NOTE — BH Assessment (Signed)
Reassessment:  Per initial assessment on 8/10: "Noah Ramsey is a 25 year old patient who was brought to [facility] via his mother due to pt not sleeping for 5 days, increased aggression, and threats towards himself and others. Pt states he came to the hospital because, "I feel like I was harassed by my so-called step-father. 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 [I would] overdose on medicine."  Upon assessment today, patient states he just fell asleep for a little while.  He has difficulty recalling the reason he presented to the ED, however does recall being angry with his step-father.  He reports step-father has been calling him the Devil, which was upsetting.  He remembers "getting really angry and punching the door."  Patient then began to ramble a bit about being Ree Kida the [employer].  He states evil spirits continue to bother him and say negative things about him. Today, the spirits have been criticizing his penis size. Patient admits to arguing with the spirits about this.  He then asks to be included in a human experiment and asks this writer to be "the boss" of the experiment.    Note:  Patient's mother contacted patient's RN to share that patient has not been doing well on the Haldol Mayfair, started  at [facility].  LPC informed RN that provider will be notified.   Patient continues to meet inpatient criteria.  SW to continue to pursue appropriate inpatient options.

## 2020-12-02 NOTE — ED Notes (Signed)
Pt up at nurses station speaking to nurse. Pt has flight of ideas. Tried to redirect pt to his room. Unsuccessful. Pt is calm at this time.

## 2020-12-02 NOTE — ED Notes (Signed)
Pt continues to remain in room and is talking to himself. Sitter outside pt room. Pt is cooperative and pleasant, does not appear to be agitated or anxious, just speaking to himself constantly.

## 2020-12-02 NOTE — ED Notes (Signed)
Pt coming in and out of his room. Pt is calm and cooperative but pacing the hallway and talking at nurses station. Pt is redirectable to his room. RN tried to encourage pt to get some rest. Pt states that there is too many loud noises around and he cant sleep. RN advised pt to shut the door to see if it helped with the noise. Pt will go in his room for a couple of mins and then comes back out.

## 2020-12-02 NOTE — ED Notes (Signed)
Dr Wallace Cullens at bedside to evaluate and assess pt.

## 2020-12-02 NOTE — Consult Note (Addendum)
  Medication Review:  Noah Ramsey 25 y.o. male   Consulted with Dr. Earlene Plater for medication management.  Kidney and Liver function not at baseline and elevated CK.  Patient remains at increased risk of developing antipsychotic induced rhabdo myalgia.  Medication Recommendation:   Discontinue Haldol.  Hold all antipsychotics. Start Trileptal 300 mg Bid   Continue Valium 5 mg Bid prn  Monitor Labs:   Order:  CBC/diff, CMP, CK, Lipid profile, and TSH  Continue to monitor EKG for prolonged QTc.  Reorder EKG  Last EKG 11/08/20:    Vent. rate  109       BPM Sinus Tachycardia PR interval  102        ms  Consider right ventricular hypertrophy QRS duration  105        ms  ST elevation, consider lateral injury QT/QTcB  327/441     ms   P-R-T axes  91   94        1   Disposition:  Continue to recommend for inpatient psychiatric treatment.    Secure message sent to patient's nurse [name], RN informing:  Kidney and Liver function not at baseline and elevated CK.  Patient remains at increased risk of developing antipsychotic induced rhabdo myalgia Medication Recommendation:  Discontinue Haldol.  Hold all antipsychotics.  Start Trileptal 300 mg Bid, Continue Valium 5 mg Bid prn.  Monitor Labs: Ordered:  CBC/diff, CMP, CK, Lipid profile, and TSH.  Last recorded EKG 11/08/20.  Continue to monitor EKG to rule out prolonged QTc.  Reordered EKG.  Please inform Md only default listed.     Assunta Found, NP

## 2020-12-02 NOTE — ED Notes (Signed)
Pt ambulated to the bathroom on his own power, gait even and steady 

## 2020-12-02 NOTE — ED Notes (Signed)
Pt received phone call from pt's mother and spoke on phone to her. Pt is now stating "I am on patrol. And I am the [employer]." and walking around nurses' station. Sitter is speaking with pt and redirecting pt back to his room. Pt returned to room without incident.

## 2020-12-02 NOTE — ED Notes (Signed)
Pt came to nurses desk asking for diazepam for his anxiety. Pt given medication and is back in room. Pt is pleasant and cooperative at this time

## 2020-12-02 NOTE — Progress Notes (Addendum)
CS[facility]. It was advised that there are no available beds at this time.  Crissie Reese, MSW, LCSW-A, LCAS-A Phone: [phone] Disposition/TOC

## 2020-12-02 NOTE — ED Notes (Signed)
Pt came out to nurses' desk to inform LEO that his "the leader of a small (unable to hear or determine what pt states he is the leader of)" Pt is calm and was redirected back to room after brief conversation with LEO.

## 2020-12-02 NOTE — Progress Notes (Signed)
Per Shuvon Ranklin,NP, patient meets criteria for inpatient treatment. There are no available or appropriate beds at [facility] today. CSW faxed referrals to the following facilities for review:  [facility]  Pending - Request Sent N/A [address], [place] [address] [phone] --  [facility]  Pending - Request Sent N/A [address] [place] [address] [phone] --  [facility]  Pending - Request Sent N/A [address] [place] [address] [phone] --  [facility]  Pending - Request Sent N/A [address] [place] [address] [phone] --  [facility]  Pending - Request Sent N/A [address] [place], [place] [address] [phone] --  [facility]  Pending - Request Sent N/A [facility] [address] [place] [address] [phone] --  [facility]  Pending - Request Sent N/A [phone] [place] [address] [phone] --  [facility]  Pending - Request Sent N/A [address] [place], [place] [address] [phone] --  [facility]  Pending - Request Sent N/A [address] [place] [address] [phone] --  [facility]  Pending - Request Sent N/A [address] [place] [place] [address] [phone] --  [facility]  Pending - Request Sent N/A [address], [place] [address] [phone] --  [facility]  Pending - Request Sent N/A [address], [place] [address] [phone] --  [facility]  Pending - Request Sent N/A [address], [place] [address] [phone] --  [facility]  Pending - Request Sent N/A [address] [place]  [address] [phone] --  [facility]  Pending - Request Sent N/A [address] [place] [address] [phone] --  [facility]  Pending - Request Sent N/A [address] [place], [place] [address] [phone] --  [facility]  Pending - Request Sent N/A [address] [place] [address] [phone] --  [facility]  Pending - Request Sent N/A [address] [place] [address] [phone] --   TTS will continue to seek bed placement.  Crissie Reese, MSW, LCSW-A, LCAS-A Phone: [phone] Disposition/TOC

## 2020-12-03 ENCOUNTER — Other Ambulatory Visit: Payer: Self-pay | Admitting: Registered Nurse

## 2020-12-03 ENCOUNTER — Inpatient Hospital Stay (HOSPITAL_COMMUNITY)
Admission: AD | Admit: 2020-12-03 | Discharge: 2021-01-05 | DRG: 885 | Disposition: A | Discharge status: Home / Self Care | Payer: 59 | Source: Intra-hospital | Attending: Psychiatry | Admitting: Psychiatry

## 2020-12-03 DIAGNOSIS — G2581 Restless legs syndrome: Secondary | ICD-10-CM | POA: Diagnosis present

## 2020-12-03 DIAGNOSIS — R4689 Other symptoms and signs involving appearance and behavior: Secondary | ICD-10-CM | POA: Diagnosis not present

## 2020-12-03 DIAGNOSIS — R45851 Suicidal ideations: Secondary | ICD-10-CM | POA: Diagnosis present

## 2020-12-03 DIAGNOSIS — F250 Schizoaffective disorder, bipolar type: Secondary | ICD-10-CM | POA: Diagnosis present

## 2020-12-03 DIAGNOSIS — M545 Low back pain, unspecified: Secondary | ICD-10-CM | POA: Diagnosis present

## 2020-12-03 DIAGNOSIS — Z6841 Body Mass Index (BMI) 40.0 and over, adult: Secondary | ICD-10-CM

## 2020-12-03 DIAGNOSIS — G4700 Insomnia, unspecified: Secondary | ICD-10-CM | POA: Diagnosis present

## 2020-12-03 DIAGNOSIS — I4510 Unspecified right bundle-branch block: Secondary | ICD-10-CM | POA: Diagnosis present

## 2020-12-03 DIAGNOSIS — F202 Catatonic schizophrenia: Secondary | ICD-10-CM | POA: Diagnosis not present

## 2020-12-03 DIAGNOSIS — R443 Hallucinations, unspecified: Secondary | ICD-10-CM | POA: Diagnosis not present

## 2020-12-03 DIAGNOSIS — Z9119 Patient's noncompliance with other medical treatment and regimen: Secondary | ICD-10-CM | POA: Diagnosis not present

## 2020-12-03 DIAGNOSIS — G8929 Other chronic pain: Secondary | ICD-10-CM | POA: Diagnosis present

## 2020-12-03 DIAGNOSIS — Z818 Family history of other mental and behavioral disorders: Secondary | ICD-10-CM

## 2020-12-03 DIAGNOSIS — F419 Anxiety disorder, unspecified: Secondary | ICD-10-CM | POA: Diagnosis present

## 2020-12-03 DIAGNOSIS — U071 COVID-19: Secondary | ICD-10-CM | POA: Diagnosis not present

## 2020-12-03 DIAGNOSIS — Z79899 Other long term (current) drug therapy: Secondary | ICD-10-CM

## 2020-12-03 DIAGNOSIS — R45850 Homicidal ideations: Secondary | ICD-10-CM | POA: Diagnosis present

## 2020-12-03 DIAGNOSIS — F259 Schizoaffective disorder, unspecified: Principal | ICD-10-CM | POA: Diagnosis present

## 2020-12-03 DIAGNOSIS — Z7984 Long term (current) use of oral hypoglycemic drugs: Secondary | ICD-10-CM | POA: Diagnosis not present

## 2020-12-03 DIAGNOSIS — K5900 Constipation, unspecified: Secondary | ICD-10-CM | POA: Diagnosis not present

## 2020-12-03 DIAGNOSIS — E119 Type 2 diabetes mellitus without complications: Secondary | ICD-10-CM | POA: Diagnosis present

## 2020-12-03 DIAGNOSIS — F29 Unspecified psychosis not due to a substance or known physiological condition: Secondary | ICD-10-CM | POA: Diagnosis present

## 2020-12-03 DIAGNOSIS — E669 Obesity, unspecified: Secondary | ICD-10-CM | POA: Diagnosis present

## 2020-12-03 DIAGNOSIS — R440 Auditory hallucinations: Secondary | ICD-10-CM | POA: Insufficient documentation

## 2020-12-03 LAB — RESP PANEL BY RT-PCR (FLU A&B, COVID) ARPGX2
Influenza A by PCR: NEGATIVE
Influenza B by PCR: NEGATIVE
SARS Coronavirus 2 by RT PCR: NEGATIVE

## 2020-12-03 LAB — CBG MONITORING, ED: Glucose-Capillary: 96 mg/dL (ref 70–99)

## 2020-12-03 MED ORDER — OXCARBAZEPINE 300 MG PO TABS
300.0000 mg | ORAL_TABLET | Freq: Two times a day (BID) | ORAL | Status: DC
  Administered 2020-12-03 – 2020-12-26 (×47): 300 mg via ORAL
  Filled 2020-12-03 (×53): qty 1

## 2020-12-03 MED ORDER — ALUM & MAG HYDROXIDE-SIMETH 200-200-20 MG/5ML PO SUSP
30.0000 mL | ORAL | Status: DC | PRN
Start: 2020-12-03 — End: 2021-01-05

## 2020-12-03 MED ORDER — ACETAMINOPHEN 325 MG PO TABS
650.0000 mg | ORAL_TABLET | Freq: Four times a day (QID) | ORAL | Status: DC | PRN
  Administered 2020-12-06 – 2020-12-14 (×4): 650 mg via ORAL
  Filled 2020-12-03 (×4): qty 2

## 2020-12-03 MED ORDER — METFORMIN HCL 500 MG PO TABS
500.0000 mg | ORAL_TABLET | Freq: Two times a day (BID) | ORAL | Status: DC
  Administered 2020-12-04 – 2021-01-05 (×64): 500 mg via ORAL
  Filled 2020-12-03 (×69): qty 1

## 2020-12-03 MED ORDER — ACETAMINOPHEN 325 MG PO TABS
650.0000 mg | ORAL_TABLET | Freq: Four times a day (QID) | ORAL | Status: DC | PRN
Start: 2020-12-03 — End: 2020-12-03
  Administered 2020-12-03: 650 mg via ORAL
  Filled 2020-12-03: qty 2

## 2020-12-03 MED ORDER — PROPRANOLOL HCL 40 MG PO TABS
40.0000 mg | ORAL_TABLET | Freq: Three times a day (TID) | ORAL | Status: DC
  Administered 2020-12-04 – 2020-12-07 (×11): 40 mg via ORAL
  Filled 2020-12-03 (×16): qty 1

## 2020-12-03 MED ORDER — MAGNESIUM HYDROXIDE 400 MG/5ML PO SUSP
30.0000 mL | Freq: Every day | ORAL | Status: DC | PRN
Start: 2020-12-03 — End: 2021-01-05

## 2020-12-03 MED ORDER — DIAZEPAM 5 MG PO TABS
5.0000 mg | ORAL_TABLET | Freq: Two times a day (BID) | ORAL | Status: DC | PRN
  Administered 2020-12-03 – 2020-12-06 (×4): 5 mg via ORAL
  Filled 2020-12-03 (×4): qty 1

## 2020-12-03 NOTE — ED Notes (Signed)
RN explained to pt that he has a bed at [facility] and the plan is for him to go tonight after report has been called.

## 2020-12-03 NOTE — ED Notes (Signed)
RN attempted to call report x1 to [facility].

## 2020-12-03 NOTE — ED Notes (Signed)
Pt approached nurse's station and expressed flight of ideas. Pt continues to be easily redirectable and assured that transport will soon bring him to [facility].

## 2020-12-03 NOTE — ED Notes (Signed)
Pt wandering in hallway through purple zone. Pt walking to close to entrance and exits door and trying to go into staff only rooms. Pt redirected to him room. Pt continues to walk around doors. [employer] and staff staying close by pt when ambulating. Continues to have flight of ideas.

## 2020-12-03 NOTE — ED Notes (Signed)
Sitter assisted pt to shower room at this time and provided pt with clean scrubs and linen.

## 2020-12-03 NOTE — Progress Notes (Addendum)
Per Joslyn Devon, [facility], pt has been accepted to [facility] bed 406-1. Accepting provider is Ashley Murrain, Attending provider is Dr. Jola Babinski. Patient can arrive pending negative COVID test. Number for report is [phone].  Crissie Reese, MSW, LCSW-A Phone: [phone] Disposition/TOC

## 2020-12-03 NOTE — ED Notes (Signed)
Lunch tray ordered 

## 2020-12-03 NOTE — ED Notes (Signed)
Pt resting in bed at this time. 

## 2020-12-03 NOTE — ED Notes (Signed)
Pt's belongings not found in any Purple Zone lockers. No documentation made apparent for the location of pt's belongings. RN called security to verify that pt does not secured belongings to collect before transfer.

## 2020-12-03 NOTE — Progress Notes (Addendum)
Per Shuvon Rankin,NP, patient meets criteria for inpatient treatment. There are no available or appropriate beds at [facility] today. CSW re-faxed referrals to the following facilities for review:  [facility]  Pending - Request Sent N/A [address], [place] [address] [phone] --  [facility]  Pending - Request Sent N/A [address] [place] [address] [phone] --  [facility]  Pending - Request Sent N/A [address] [place] [address] [phone] --  [facility]  Pending - Request Sent N/A [address] [place] [address] [phone] --  [facility]  Pending - Request Sent N/A [address] [place], [place] [address] [phone] --  [facility]  Pending - Request Sent N/A [address] [facility] [address] [place] [address] [phone] --  [facility]  Pending - Request Sent N/A [phone] [address] [place] [place] [address] [phone] --  [facility]  Pending - Request Sent N/A [address] [place], [place] [address] [phone] --  [facility]  Pending - Request Sent N/A [address], [place] [address] [phone] --  [facility]  Pending - Request Sent N/A [address] [place] [address] [phone] --  [facility]  Pending - Request Sent N/A [address] [place] [address] [phone] --  [facility]  Pending - Request Sent N/A [address] [place] [address] [phone] --  [facility]  Pending - Request Sent N/A [address] [place] [address] [phone] --  [facility]  Pending - Request Sent N/A [address] [place]  [address] [phone] --  [facility]  Pending - Request Sent N/A [address] [place] [address] [phone] --  [facility]  Pending - Request Sent N/A [address] [place], [place] [address] [phone] --  [facility]  Pending - Request Sent N/A [address] [place] [address] [phone] --  [facility]  Pending - Request Sent N/A [address], [place] [address] [phone] --   TTS will continue to seek bed placement.  Crissie Reese, MSW, LCSW-A, LCAS-A Phone: [phone] Disposition/TOC

## 2020-12-03 NOTE — ED Notes (Signed)
[employer] in purple zone for pt transport

## 2020-12-03 NOTE — ED Provider Notes (Signed)
Emergency Medicine Observation Re-evaluation Note  Noah Ramsey is a 25 y.o. male, seen on rounds today.  Pt initially presented to the ED for complaints of Suicidal and Homicidal Currently, the patient is awaiting placement.  Physical Exam  BP 131/74 (BP Location: Right Arm)   Pulse 88   Temp 98.6 F (37 C) (Oral)   Resp 17   Ht 5\' 7"  (1.702 m)   Wt (!) 143 kg   SpO2 98%   BMI 49.38 kg/m  Physical Exam General: resting  Cardiac: well perfused  Lungs: even, unlabored respirations  Psych: calm  ED Course / MDM  EKG:   I have reviewed the labs performed to date as well as medications administered while in observation.  Recent changes in the last 24 hours include patient wandering at times but not violent and able to re-direct.  Will follow repeat EKG today and have ordered a repeat CK with yesterday's labs elevated.   In communication with TTS, patient will need repeat COVID test and labs with elevated CK. These are ordered along with EKG. Bed [facility] available at [facility].    EKG Interpretation  Date/Time:  Sunday December 03 2020 14:13:01 EDT Ventricular Rate:  82 PR Interval:  120 QRS Duration: 116 QT Interval:  352 QTC Calculation: 411 R Axis:   79 Text Interpretation: Normal sinus rhythm Incomplete right bundle branch block  Unchanged from prior tracing Abnormal ECG Confirmed by 03-03-2005 [phone]) on 12/03/2020 2:39:42 PM       EKG interpreted by me as above. Labs pending.   Plan  Current plan is for inpatient placement. Noah Ramsey is under involuntary commitment.      Noah Moss, MD 12/03/20 [phone]

## 2020-12-03 NOTE — ED Notes (Signed)
Called [employer] to Transport patient to behavioral health.

## 2020-12-03 NOTE — ED Notes (Signed)
Called [employer] for transport to [facility] and faxed paper work to [facility]

## 2020-12-03 NOTE — Consult Note (Signed)
Telepsych Consultation   Reason for Consult:  IVC, aggression, psychosis Referring Physician:  Rayne Du Location of Patient: [facility] ED Location of Provider: Other: [facility]  Patient Identification: Noah Ramsey MRN:  [id number] Principal Diagnosis: Schizophrenia, catatonic type (HCC) Diagnosis:  Principal Problem:   Schizophrenia, catatonic type (HCC) Active Problems:   Aggressive behavior   Total Time spent with patient: 30 minutes  Subjective:   Noah Ramsey is a 25 y.o. male patient admitted to [facility] ED brought in by his mother with complaints the patient had not slept in 5 days, increased aggression, threats towards himself and others.  Patient also having delusional thoughts of being an [place] got or in vampire.Marland Kitchen Psychiatric Consult Reassessment 12/03/2020 Noah Ramsey, 25 y.o., male patient seen via tele health by this provider, consulted with Dr. Nelly Rout; and chart reviewed on 12/03/20.  On evaluation Noah Ramsey reports he is feeling better.  Patient states "I'm doing good."  Patient reports that he has been eating without any difficulty.  Reports his sleep is good but sometimes the noises wake him up.  Patient denies auditory/visual hallucinations at this time, also denies paranoia.  Patient asked if he has spoken to his mother over the phone and reports that he doesn't need a phone because they can talk telepathically and spiritually. Will continue to monitor patient's labs related to elevated CK and a history of antipsychotic induced rhabdomyolysis.  Patient has been tolerating medication changes well and show some improvement. During evaluation Noah Ramsey is sitting in chair at bedside table eating his breakfast.  He is in no acute distress.  He is alert and oriented x 3.  He is calm and cooperative throughout assessment.  Patient continues to have delusional thinking that he is able to speak telepathically to his mother.  Continues to  present with some disorganization but improving.  His mood is dysphoric and labile with congruent affect.  Currently he denies suicidal/self-harm/homicidal ideation, psychosis, and paranoia; but continues to make delusional statements.  Patient has been calm and cooperative while in ED.  Has left room getting close to exit doors but has been redirectable by staff.     Psychiatric Consult 12/01/2020 HPI:  Noah Ramsey, 25 y.o., male patient seen via tele health by this provider, consulted with Dr. Earlene Plater; and chart reviewed on 12/01/2020.  On evaluation Noah Ramsey reports he is not sure of why he is in the hospital.  Patient reports that he lives with his mother, brother, and stepfather.  Patient denies any aggression but states that he gestures like he is going to hit someone which makes him afraid. "I act like I am.  They be scared because I am 300 pounds."  Patient endorses auditory hallucinations.  Stating the voices are evil spirits trying to bother him "They make fun of me and degrade me."  Patient denies suicidal ideation at this time stating "Not anymore" stating the last time he had any suicidal thoughts was "I think 3 days ago."  Patient also denies homicidal ideation stating he was 3 days ago when had not last had any thoughts.  Patient does endorse paranoia.  And then made a statement about having his guns confiscated by the police along with a 30 clip, a 50 clip, and a $3000 laptop computer."  Patient asked if he has spoken with his mother since being in the hospital he stated that he was able to talk to his mother telepathically male.  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   During evaluation Noah Ramsey is sitting on side of bed eating his breakfast in no acute distress.  He is alert, oriented to self and place.  Patient continues to have some disorganization.  Patient was calm and cooperative throughout assessment.   His mood is labile.  Patient continues to have some delusional thinking along with paranoia and auditory hallucinations.  Currently patient denies suicidal/homicidal ideation.  We will continue to recommend inpatient psychiatric treatment   Past Psychiatric History: Schizophrenia  Risk to Self: Denies Risk to Others:   Prior Inpatient Therapy: Yes Prior Outpatient Therapy: Yes  Past Medical History:  Past Medical History:  Diagnosis Date   Elevated CPK    per patient   Schizophrenia (HCC)    History reviewed. No pertinent surgical history. Family History:  Family History  Problem Relation Age of Onset   Mental illness Brother    Family Psychiatric  History: Patient unaware records indicate a brother with mental health issues Social History:  Social History   Substance and Sexual Activity  Alcohol Use Never     Social History   Substance and Sexual Activity  Drug Use Never    Social History   Socioeconomic History   Marital status: Single    Spouse name: Not on file   Number of children: Not on file   Years of education: Not on file   Highest education level: Not on file  Occupational History   Not on file  Tobacco Use   Smoking status: Never   Smokeless tobacco: Never  Substance and Sexual Activity   Alcohol use: Never   Drug use: Never   Sexual activity: Not on file  Other Topics Concern   Not on file  Social History Narrative   Not on file   Social Determinants of Health   Financial Resource Strain: Not on file  Food Insecurity: Not on file  Transportation Needs: Not on file  Physical Activity: Not on file  Stress: Not on file  Social Connections: Not on file   Additional Social History:    Allergies:  No Known Allergies  Labs:  Results for orders placed or performed during the hospital encounter of 11/29/20 (from the past 48 hour(s))  CBC with Differential/Platelet     Status: None   Collection Time: 12/02/20  3:48 PM  Result Value Ref  Range   WBC 5.8 4.0 - 10.5 K/uL   RBC 4.59 4.22 - 5.81 MIL/uL   Hemoglobin 14.6 13.0 - 17.0 g/dL   HCT 02.5 42.7 - 06.2 %   MCV 93.0 80.0 - 100.0 fL   MCH 31.8 26.0 - 34.0 pg   MCHC 34.2 30.0 - 36.0 g/dL   RDW 37.6 28.3 - 15.1 %   Platelets 300 150 - 400 K/uL   nRBC 0.0 0.0 - 0.2 %   Neutrophils Relative % 51 %   Neutro Abs 2.9 1.7 - 7.7 K/uL   Lymphocytes Relative 37 %   Lymphs Abs 2.2 0.7 - 4.0 K/uL   Monocytes Relative 9 %   Monocytes Absolute 0.5 0.1 - 1.0 K/uL   Eosinophils Relative 3 %   Eosinophils Absolute 0.2 0.0 - 0.5 K/uL   Basophils Relative 0 %   Basophils Absolute 0.0 0.0 - 0.1 K/uL   Immature Granulocytes 0 %   Abs Immature Granulocytes 0.01 0.00 - 0.07 K/uL    Comment: Performed at [facility], [address],  [place], [place] [address]  Comprehensive metabolic panel     Status: Abnormal   Collection Time: 12/02/20  3:48 PM  Result Value Ref Range   Sodium 138 135 - 145 mmol/L   Potassium 4.1 3.5 - 5.1 mmol/L   Chloride 107 98 - 111 mmol/L   CO2 23 22 - 32 mmol/L   Glucose, Bld 105 (H) 70 - 99 mg/dL    Comment: Glucose reference range applies only to samples taken after fasting for at least 8 hours.   BUN 12 6 - 20 mg/dL   Creatinine, Ser 6.211.10 0.61 - 1.24 mg/dL   Calcium 9.5 8.9 - 30.810.3 mg/dL   Total Protein 7.7 6.5 - 8.1 g/dL   Albumin 4.0 3.5 - 5.0 g/dL   AST 70 (H) 15 - 41 U/L   ALT 85 (H) 0 - 44 U/L   Alkaline Phosphatase 97 38 - 126 U/L   Total Bilirubin 0.9 0.3 - 1.2 mg/dL   GFR, Estimated >65>60 >78>60 mL/min    Comment: (NOTE) Calculated using the CKD-EPI Creatinine Equation (2021)    Anion gap 8 5 - 15    Comment: Performed at [facility], [address] [place], [place] [address]  CK     Status: Abnormal   Collection Time: 12/02/20  3:48 PM  Result Value Ref Range   Total CK 1,646 (H) 49 - 397 U/L    Comment: Performed at [facility], [address] [place], [place] [address]  Lipid panel     Status: Abnormal   Collection  Time: 12/02/20  3:53 PM  Result Value Ref Range   Cholesterol 123 0 - 200 mg/dL   Triglycerides 74 <132<150 mg/dL   HDL 35 (L) >44>40 mg/dL   Total CHOL/HDL Ratio 3.5 RATIO   VLDL 15 0 - 40 mg/dL   LDL Cholesterol 73 0 - 99 mg/dL    Comment:        Total Cholesterol/HDL:CHD Risk Coronary Heart Disease Risk Table                     Men   Women  1/2 Average Risk   3.4   3.3  Average Risk       5.0   4.4  2 X Average Risk   9.6   7.1  3 X Average Risk  23.4   11.0        Use the calculated Patient Ratio above and the CHD Risk Table to determine the patient's CHD Risk.        ATP III CLASSIFICATION (LDL):  <100     mg/dL   Optimal  [id number]  mg/dL   Near or Above                    Optimal  130-159  mg/dL   Borderline  [id number]  mg/dL   High  >034>190     mg/dL   Very High Performed at [facility], [address] [place], [place] [address]   Troponin I (High Sensitivity)     Status: None   Collection Time: 12/02/20  3:53 PM  Result Value Ref Range   Troponin I (High Sensitivity) 5 <18 ng/L    Comment: (NOTE) Elevated high sensitivity troponin I (hsTnI) values and significant  changes across serial measurements may suggest ACS but many other  chronic and acute conditions are known to elevate hsTnI results.  Refer to the "Links"  section for chest pain algorithms and additional  guidance. Performed at [facility], [address], [place], [place] [address]     Medications:  Current Facility-Administered Medications  Medication Dose Route Frequency Provider Last Rate Last Admin   acetaminophen (TYLENOL) tablet 650 mg  650 mg Oral Q6H PRN Zadie Rhine, MD   650 mg at 12/03/20 0233   diazepam (VALIUM) tablet 5 mg  5 mg Oral BID PRN Charlynne Pander, MD   5 mg at 12/02/20 2101   metFORMIN (GLUCOPHAGE) tablet 500 mg  500 mg Oral BID WC Charlynne Pander, MD   500 mg at 12/03/20 0915   Oxcarbazepine (TRILEPTAL) tablet 300 mg  300 mg Oral BID Noah Schaberg B, NP   300  mg at 12/03/20 0915   propranolol (INDERAL) tablet 40 mg  40 mg Oral TID Charlynne Pander, MD   40 mg at 12/03/20 0915   Current Outpatient Medications  Medication Sig Dispense Refill   diazepam (VALIUM) 5 MG tablet Take 5 mg by mouth 2 (two) times daily as needed for anxiety.     LATUDA 40 MG TABS tablet Take 40 mg by mouth at bedtime.     OVER THE COUNTER MEDICATION Ripped Fat Burner (diet pill)     OVER THE COUNTER MEDICATION Yohimbine (diet pill)     propranolol (INDERAL) 40 MG tablet Take 40 mg by mouth 3 (three) times daily.     haloperidol (HALDOL) 10 MG tablet Take 10 mg by mouth at bedtime. (Patient not taking: No sig reported)     haloperidol decanoate (HALDOL DECANOATE) 100 MG/ML injection Inject 100 mg into the muscle every 28 (twenty-eight) days. (Patient not taking: No sig reported)     metFORMIN (GLUCOPHAGE) 500 MG tablet Take 1 tablet (500 mg total) by mouth 2 (two) times daily with a meal. (Patient not taking: No sig reported) 180 tablet 3    Musculoskeletal: Strength & Muscle Tone: within normal limits Gait & Station: normal Patient leans: N/A  Psychiatric Specialty Exam:  Presentation  General Appearance: Appropriate for Environment  Eye Contact:Good  Speech:Clear and Coherent; Normal Rate  Speech Volume:Normal  Handedness:Right   Mood and Affect  Mood:Dysphoric  Affect:Congruent   Thought Process  Thought Processes:Coherent; Goal Directed; Linear  Descriptions of Associations:Tangential  Orientation:Full (Time, Place and Person)  Thought Content:Delusions; Tangential  History of Schizophrenia/Schizoaffective disorder:Yes  Duration of Psychotic Symptoms:Greater than six months  Hallucinations:Hallucinations: None (Patient denies auditory hallucinations at this time.)  Ideas of Reference:None (Patient denies paranoia at this time.  But continues to have some delusional thinking.  When asked about speaking to his mother still responds that  they can speak telepathically and spiritually.)  Suicidal Thoughts:Suicidal Thoughts: No  Homicidal Thoughts:Homicidal Thoughts: No   Sensorium  Memory:Immediate Fair; Recent Fair  Judgment:Fair  Insight:Fair   Executive Functions  Concentration:Fair  Attention Span:Fair  Recall:Fair  Fund of Knowledge:Fair  Language:Fair   Psychomotor Activity  Psychomotor Activity:Psychomotor Activity: Restlessness (Pacing)   Assets  Assets:Communication Skills; Desire for Improvement; Financial Resources/Insurance; Social Support; Housing   Sleep  Sleep:Sleep: Good    Physical Exam: Physical Exam Vitals and nursing note reviewed. Exam conducted with a chaperone present.  Constitutional:      General: He is not in acute distress.    Appearance: Normal appearance. He is obese. He is not ill-appearing.  Cardiovascular:     Rate and Rhythm: Normal rate.  Pulmonary:     Effort: Pulmonary effort is  normal.  Neurological:     Mental Status: He is alert.     Comments: Oriented to self and place   Psychiatric:        Attention and Perception: Attention normal.        Mood and Affect: Affect is labile.        Speech: Delayed: Slow.        Behavior: Behavior is cooperative.        Thought Content: Thought content is paranoid and delusional. Thought content does not include homicidal or suicidal ideation.        Judgment: Judgment is impulsive.   Review of Systems  Constitutional: Negative.   HENT: Negative.    Eyes: Negative.   Respiratory: Negative.    Cardiovascular: Negative.   Gastrointestinal: Negative.   Genitourinary: Negative.   Musculoskeletal: Negative.        Patient states he is not having any muscle aches or pains  Skin: Negative.   Neurological: Negative.   Endo/Heme/Allergies: Negative.   Psychiatric/Behavioral:  Negative for depression (Denies). Hallucinations: Patient denies auditory and visual hallucinations at this time. Suicidal ideas: Denies at  this time.The patient is nervous/anxious. Insomnia: States he slept well but the loud noises wakes him up..  Blood pressure 131/74, pulse 88, temperature 98.6 F (37 C), temperature source Oral, resp. rate 17, height  (1.702 m), weight (!) 143 kg, SpO2 98 %. Body mass index is 49.38 kg/m.  Treatment Plan Summary: Daily contact with patient to assess and evaluate symptoms and progress in treatment, Medication management, and Plan inpatient psychiatric treatment.   Recommendations:  To hold all antipsychotics until CK and EkG are obtained as patient remains at high risk of developing antipsychotic induced rhabdo myalgia.  Hold antipsychotic until CK <300.  Medication Management:   Continue Trileptal 200 mg Bid Valium 5 mg Bid prn  Labs Reviewed Improvement in Kidney function (GFR and creatine) WNL in the last 3 weeks CBC/Dif: WNL TSH results not in Liver function: Slight increase in AST/ALT from 4 days ago  Chronic history of elevated CK.  More than likely related antipsychotic medications.  Theer was an increase in CK over the last 2 days from 1,423 on 12/01/20 to 1,646 on 12/02/20.  All psychotropic medications were discontinued, and patient started on Trileptal 300 mg Bid.  Continued the Valium 5 mg Bid prn agitation.    EKG:  Last EKG recorded 11/08/20.  Orders for repeat EKG have been ordered assuming done but results not posted or able to be reviewed at this time.  Important to monitor QTc (hear) with psychotropic medications.    Vent. rate        109           BPM           Sinus Tachycardia PR interval      102            ms             Consider right ventricular hypertrophy QRS duration  105            ms             ST elevation, consider lateral injury QT/QTcB         327/441    ms              P-R-T axes      91   94       1  Disposition: Recommend psychiatric Inpatient admission when medically cleared.  This service was provided via telemedicine using a 2-way,  interactive audio and video technology.  Names of all persons participating in this telemedicine service and their role in this encounter. Name: Assunta Found Role: NP  Name: Dr. Earlene Plater Role: Psychiatrist  Name: Noah Ramsey Role: Patient  Name: Wallene Dales RN Role: Patient's nurse sent a secure message informing: Psychiatric consult complete.  Continue to recommend inpatient psychiatric treatment.  Really need to get repeat EKG on patient, last recorded was 11/08/20, ordered yesterday but not released so unsure if it was able to be done.  Will need to keep close eye on CK, liver and kidney function.  Please inform MD only default is listed.      Mikaylee Arseneau, NP 12/03/2020 1:42 PM

## 2020-12-03 NOTE — ED Notes (Signed)
Pt continued to stand at nurse's station, saying he did not want to go to his room because "he owns the hospital." Pt re-oriented to reality by staff and re-directed to his room & given warm blankets.

## 2020-12-03 NOTE — ED Notes (Signed)
Breakfast Ordered 

## 2020-12-03 NOTE — Progress Notes (Signed)
Patient ID: Noah Ramsey, male   DOB: [birth year], 25 y.o.   MRN: [id number] Patient is accepted to [facility] pending negative covid result from today. Please call nurse-to-nurse report to [phone] once result returns. Thanks, Rayfield Citizen, RN/AC

## 2020-12-04 ENCOUNTER — Other Ambulatory Visit: Payer: Self-pay

## 2020-12-04 ENCOUNTER — Encounter (HOSPITAL_COMMUNITY): Payer: Self-pay | Admitting: Registered Nurse

## 2020-12-04 DIAGNOSIS — F250 Schizoaffective disorder, bipolar type: Principal | ICD-10-CM

## 2020-12-04 MED ORDER — LORAZEPAM 1 MG PO TABS: 1.0000 mg | ORAL_TABLET | Freq: Four times a day (QID) | ORAL | Status: DC | PRN

## 2020-12-04 MED ORDER — RISPERIDONE 1 MG PO TBDP
3.0000 mg | ORAL_TABLET | Freq: Every day | ORAL | Status: DC
  Administered 2020-12-05 – 2020-12-06 (×3): 3 mg via ORAL
  Filled 2020-12-04 (×4): qty 3

## 2020-12-04 MED ORDER — RISPERIDONE 2 MG PO TBDP
2.0000 mg | ORAL_TABLET | Freq: Every day | ORAL | Status: DC
  Administered 2020-12-04 – 2020-12-06 (×3): 2 mg via ORAL
  Filled 2020-12-04 (×6): qty 1

## 2020-12-04 MED ORDER — ZIPRASIDONE MESYLATE 20 MG IM SOLR: 20.0000 mg | Freq: Two times a day (BID) | INTRAMUSCULAR | Status: DC | PRN

## 2020-12-04 MED ORDER — OLANZAPINE 10 MG PO TBDP
10.0000 mg | ORAL_TABLET | Freq: Three times a day (TID) | ORAL | Status: DC | PRN
  Administered 2020-12-05: 10 mg via ORAL
  Filled 2020-12-04: qty 1

## 2020-12-04 MED ORDER — BENZTROPINE MESYLATE 0.5 MG PO TABS
0.5000 mg | ORAL_TABLET | Freq: Two times a day (BID) | ORAL | Status: DC | PRN
  Administered 2020-12-05: 0.5 mg via ORAL
  Filled 2020-12-04: qty 1

## 2020-12-04 NOTE — [facility] Group Notes (Signed)
[facility] Group Notes:  (Nursing/MHT/Case Management/Adjunct)  Date:  12/04/2020  Time:  10:15 AM  Type of Therapy:  Group Therapy  Participation Level:  Active  Participation Quality:  Appropriate and Attentive  Affect:  Appropriate  Cognitive:  Alert and Appropriate  Insight:  Appropriate  Engagement in Group:  Engaged  Modes of Intervention:  Orientation  Summary of Progress/Problems: Pt goal for today is workout in his room, drink gallons of water and to only have fruits and salads.   Robbert Langlinais J Murle Hellstrom 12/04/2020, 10:15 AM

## 2020-12-04 NOTE — [facility] Group Notes (Signed)
[facility] LCSW Group Therapy   Type of Therapy and Topic:  Group Therapy:  Wellness   Participation Level: Active  Description of Group: This group allows individuals to explore the 6 dimensions of wellness, including spiritual, emotional, intellectual, physical, social, environmental, financial and spiritual. Patients will learn to different ways to practice wellness to improve well-being. Patients also participated in a conversation about what wellness means to them.   Individuals will think about ways in which they currently practice wellness as well as ways they can improve their wellness and new ways to practice wellness.       Therapeutic Goals Patient will verbalize 1 pr 2 we;;mess areas where they are doing well. Patient will identify 2 areas where they would like to improve their wellness.   Patient will provide a definition of what wellness means to them.  Patients will reflect on current hospitalization and primary areas to maintain mental health to prevent re-hospitalization.     Summary of Patient Progress:  Patient participated in wellness group.  Patient was disorganized at times but was able to be redirected.  Patient shared that taking his medications and taking deep breaths helps to maintain his wellness.     Therapeutic Modalities Cognitive Behavioral Therapy Motivational Interviewing   Becci Batty, LCSW, LCAS Clincal Social Worker  [facility]

## 2020-12-04 NOTE — Progress Notes (Signed)
Dar Note: Patient presents with anxious affect and paranoid behaviors.  Patient is sexually inappropriate and intrusive.  Needed a lots of redirection on the unit.  Medications given as prescribed.  Routine safety checks maintained.  Patient is safe on the unit.

## 2020-12-04 NOTE — Progress Notes (Signed)
Adult Psychoeducational Group Note  Date:  12/04/2020 Time:  9:12 PM  Group Topic/Focus:  Wrap-Up Group:   The focus of this group is to help patients review their daily goal of treatment and discuss progress on daily workbooks.  Participation Level:  Did Not Attend  Participation Quality:   Did Not Attend  Affect:   Did Not Attend  Cognitive:   Did Not Attend  Insight: None  Engagement in Group:   Did Not Attend  Modes of Intervention:   Did Not Attend  Additional Comments:  Pt did not attend evening wrap up group tonight.  [name] 12/04/2020, 9:12 PM

## 2020-12-04 NOTE — Tx Team (Signed)
Interdisciplinary Treatment and Diagnostic Plan Update  12/04/2020 Time of Session: 9:55am Noah Ramsey MRN: [id number]  Principal Diagnosis: <principal problem not specified>  Secondary Diagnoses: Active Problems:   Schizoaffective disorder (HCC)   Current Medications:  Current Facility-Administered Medications  Medication Dose Route Frequency Provider Last Rate Last Admin   acetaminophen (TYLENOL) tablet 650 mg  650 mg Oral Q6H PRN Rankin, Shuvon B, NP       alum & mag hydroxide-simeth (MAALOX/MYLANTA) 200-200-20 MG/5ML suspension 30 mL  30 mL Oral Q4H PRN Rankin, Shuvon B, NP       benztropine (COGENTIN) tablet 0.5 mg  0.5 mg Oral BID PRN Sharma Covert, MD       diazepam (VALIUM) tablet 5 mg  5 mg Oral BID PRN Rankin, Shuvon B, NP   5 mg at 12/03/20 2312   OLANZapine zydis (ZYPREXA) disintegrating tablet 10 mg  10 mg Oral Q8H PRN Sharma Covert, MD       And   LORazepam (ATIVAN) tablet 1 mg  1 mg Oral Q6H PRN Sharma Covert, MD       And   ziprasidone (GEODON) injection 20 mg  20 mg Intramuscular Q12H PRN Sharma Covert, MD       magnesium hydroxide (MILK OF MAGNESIA) suspension 30 mL  30 mL Oral Daily PRN Rankin, Shuvon B, NP       metFORMIN (GLUCOPHAGE) tablet 500 mg  500 mg Oral BID WC Rankin, Shuvon B, NP   500 mg at 12/04/20 1962   Oxcarbazepine (TRILEPTAL) tablet 300 mg  300 mg Oral BID Rankin, Shuvon B, NP   300 mg at 12/04/20 2297   propranolol (INDERAL) tablet 40 mg  40 mg Oral TID Rankin, Shuvon B, NP   40 mg at 12/04/20 0837   risperiDONE (RISPERDAL M-TABS) disintegrating tablet 2 mg  2 mg Oral Daily Sharma Covert, MD       risperiDONE (RISPERDAL M-TABS) disintegrating tablet 3 mg  3 mg Oral QHS Mallie Darting Cordie Grice, MD       PTA Medications: Medications Prior to Admission  Medication Sig Dispense Refill Last Dose   diazepam (VALIUM) 5 MG tablet Take 5 mg by mouth 2 (two) times daily as needed for anxiety.      haloperidol (HALDOL) 10 MG  tablet Take 10 mg by mouth at bedtime. (Patient not taking: No sig reported)      haloperidol decanoate (HALDOL DECANOATE) 100 MG/ML injection Inject 100 mg into the muscle every 28 (twenty-eight) days. (Patient not taking: No sig reported)      LATUDA 40 MG TABS tablet Take 40 mg by mouth at bedtime.      metFORMIN (GLUCOPHAGE) 500 MG tablet Take 1 tablet (500 mg total) by mouth 2 (two) times daily with a meal. (Patient not taking: No sig reported) 180 tablet 3    OVER THE COUNTER MEDICATION Ripped Fat Burner (diet pill)      OVER THE COUNTER MEDICATION Yohimbine (diet pill)      propranolol (INDERAL) 40 MG tablet Take 40 mg by mouth 3 (three) times daily.       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   Physician Treatment Plan for Primary Diagnosis: <principal problem not specified> Long Term Goal(s):     Short Term Goals:    Medication Management:  Evaluate patient's response, side effects, and tolerance of medication regimen.  Therapeutic Interventions: 1 to 1 sessions, Unit Group sessions and Medication administration.  Evaluation of Outcomes: Not Met  Physician Treatment Plan for Secondary Diagnosis: Active Problems:   Schizoaffective disorder ([facility])  Long Term Goal(s):     Short Term Goals:       Medication Management: Evaluate patient's response, side effects, and tolerance of medication regimen.  Therapeutic Interventions: 1 to 1 sessions, Unit Group sessions and Medication administration.  Evaluation of Outcomes: Not Met   RN Treatment Plan for Primary Diagnosis: <principal problem not specified> Long Term Goal(s): Knowledge of disease and therapeutic regimen to maintain health will improve  Short Term Goals:  Ability to remain free from injury will improve, Ability to verbalize frustration and anger appropriately will improve, Ability to demonstrate self-control, Ability to identify and develop effective coping behaviors will improve, and Compliance with prescribed medications will improve  Medication Management: RN will administer medications as ordered by provider, will assess and evaluate patient's response and provide education to patient for prescribed medication. RN will report any adverse and/or side effects to prescribing provider.  Therapeutic Interventions: 1 on 1 counseling sessions, Psychoeducation, Medication administration, Evaluate responses to treatment, Monitor vital signs and CBGs as ordered, Perform/monitor CIWA, COWS, AIMS and Fall Risk screenings as ordered, Perform wound care treatments as ordered.  Evaluation of Outcomes: Not Met   LCSW Treatment Plan for Primary Diagnosis: <principal problem not specified> Long Term Goal(s): Safe transition to appropriate next level of care at discharge, Engage patient in therapeutic group addressing interpersonal concerns.  Short Term Goals: Engage patient in aftercare planning with referrals and resources, Increase social support, Increase ability to appropriately verbalize feelings, Increase emotional regulation, Identify triggers associated with mental health/substance abuse issues, and Increase skills for wellness and recovery  Therapeutic Interventions: Assess for all discharge needs, 1 to 1 time with Social worker, Explore available resources and support systems, Assess for adequacy in community support network, Educate family and significant other(s) on suicide prevention, Complete Psychosocial Assessment, Interpersonal group therapy.  Evaluation of Outcomes: Not Met   Progress in Treatment: Attending groups: Yes. Participating in groups: Yes. Taking medication as prescribed: Yes. Toleration medication: Yes. Family/Significant other  contact made: No, will contact:  if consent is provided Patient understands diagnosis: Yes. Discussing patient identified problems/goals with staff: Yes. Medical problems stabilized or resolved: Yes. Denies suicidal/homicidal ideation: Yes. Issues/concerns per patient self-inventory: No.   New problem(s) identified: No, Describe:  none  New Short Term/Long Term Goal(s): medication stabilization, elimination of SI thoughts, development of comprehensive mental wellness plan.    Patient Goals:  Did not attend  Discharge Plan or Barriers: Patient recently admitted. CSW will continue to follow and assess for appropriate referrals and possible discharge planning.    Reason for Continuation of Hospitalization: Aggression Delusions  Hallucinations Medication stabilization  Estimated Length of Stay: 3-5 days  Attendees: Patient: Did not attend 12/04/2020   Physician: Fatima Sanger, DO 12/04/2020   Nursing:  12/04/2020   RN Care Manager: 12/04/2020   Social Worker: Darletta Moll, LCSW 12/04/2020  Recreational Therapist:  12/04/2020   Other:  12/04/2020   Other:  12/04/2020   Other: 12/04/2020     Scribe for Treatment Team: Vassie Moselle, Bee 12/04/2020 11:20 AM

## 2020-12-04 NOTE — [facility] Suicide Risk Assessment (Signed)
[facility] Admission Suicide Risk Assessment   Nursing information obtained from:  Patient Demographic factors:  Male, Unemployed, Adolescent or young adult Current Mental Status:  NA Loss Factors:  Decrease in vocational status Historical Factors:  Impulsivity Risk Reduction Factors:  Living with another person, especially a relative  Total Time spent with patient: 30 minutes Principal Problem: <principal problem not specified> Diagnosis:  Active Problems:   Schizoaffective disorder (HCC)  Subjective Data: Patient is seen and examined.  Patient is a 25 year old male with a reported past psychiatric history significant for schizoaffective disorder; bipolar type who originally presented to the [facility] emergency department on 11/29/2020 secondary to suicidal ideation, homicidal ideation, auditory hallucinations and not sleeping.  The patient had been recently discharged from the [facility] for similar complaints.  Apparently he had been given propranolol, diazepam and Latuda in an attempt to control his psychotic symptoms after he had been discharged from [facility].  The patient told me that he had been discharged on Abilify and the long-acting Abilify injection but that that had not worked well.  He is followed by Dr Carvel Getting as an outpatient and had been previously given the [place].  The patient admitted that he has a longstanding history of noncompliance.  He admitted to auditory hallucinations to me today.  He did deny suicidal or homicidal ideation.  He stated that he has been admitted to the psychiatric hospital between 10 and 15 times since he was diagnosed with schizophrenia versus schizoaffective disorder at age 32 or 17.  Collateral information obtained from his mother stated the patient had not been sleeping for 5 days, had increased aggression, and threats towards himself and others.  The patient stated "I feel like I am being harassed by my so-called stepfather, he  thinks I had sex with my mom.  The patient stated that he had previously lived in [place] and moved here somewhere between 2018 and 2019.  He is currently not working.  He did admit to previous use of substances including marijuana.  He was admitted to the hospital for evaluation and stabilization.  He stated he had been on long-acting injectable medication, and the review of the limited information that we have in the electronic medical record revealed that he had been on Haldol decanoate in the past.  He stated he prefer not to take that because it made him "stiff and cause problems with my bones".  He admitted to having previously been treated with the Latuda, Haldol, Abilify and others which he was unclear of.  On admission his EKG showed an incomplete right bundle branch block, but normal QTc interval and sinus rate.  No major abnormality secondary to the beta-blocker overdose.  Continued Clinical Symptoms:  Alcohol Use Disorder Identification Test Final Score (AUDIT): 0 The "Alcohol Use Disorders Identification Test", Guidelines for Use in Primary Care, Second Edition.  World Science writer [facility]). Score between 0-7:  no or low risk or alcohol related problems. Score between 8-15:  moderate risk of alcohol related problems. Score between 16-19:  high risk of alcohol related problems. Score 20 or above:  warrants further diagnostic evaluation for alcohol dependence and treatment.   CLINICAL FACTORS:   Schizophrenia:   Less than 73 years old Paranoid or undifferentiated type   Musculoskeletal: Strength & Muscle Tone: within normal limits Gait & Station: normal Patient leans: N/A  Psychiatric Specialty Exam:  Presentation  General Appearance: Fairly Groomed  Eye Contact:Fair  Speech:Normal Rate  Speech Volume:Normal  Handedness:Right   Mood and Affect  Mood:Dysphoric  Affect:Flat   Thought Process  Thought Processes:Goal Directed  Descriptions of  Associations:Loose  Orientation:Full (Time, Place and Person)  Thought Content:Delusions; Paranoid Ideation  History of Schizophrenia/Schizoaffective disorder:Yes  Duration of Psychotic Symptoms:Greater than six months  Hallucinations:Hallucinations: Auditory  Ideas of Reference:Delusions; Paranoia  Suicidal Thoughts:Suicidal Thoughts: No  Homicidal Thoughts:Homicidal Thoughts: No   Sensorium  Memory:Immediate Fair; Recent Fair; Remote Fair  Judgment:Impaired  Insight:Fair   Executive Functions  Concentration:Fair  Attention Span:Fair  Recall:Fair  Fund of Knowledge:Fair  Language:Fair   Psychomotor Activity  Psychomotor Activity:Psychomotor Activity: Normal   Assets  Assets:Desire for Improvement; Resilience; Housing; Social Support   Sleep  Sleep:Sleep: Poor    Physical Exam: Physical Exam Vitals and nursing note reviewed.  Constitutional:      Appearance: Normal appearance. He is obese.  HENT:     Head: Normocephalic and atraumatic.  Pulmonary:     Effort: Pulmonary effort is normal.  Neurological:     General: No focal deficit present.     Mental Status: He is alert and oriented to person, place, and time.   Review of Systems  All other systems reviewed and are negative. Blood pressure 125/68, pulse 88, temperature 97.8 F (36.6 C), temperature source Oral, resp. rate 16, height 5\' 6"  (1.676 m), weight 134.3 kg, SpO2 100 %. Body mass index is 47.78 kg/m.   COGNITIVE FEATURES THAT CONTRIBUTE TO RISK:  Thought constriction (tunnel vision)    SUICIDE RISK:   Mild:  Suicidal ideation of limited frequency, intensity, duration, and specificity.  There are no identifiable plans, no associated intent, mild dysphoria and related symptoms, good self-control (both objective and subjective assessment), few other risk factors, and identifiable protective factors, including available and accessible social support.  PLAN OF CARE: Patient is seen and  examined.  Patient is a 25 year old male with the above-stated past psychiatric history who was admitted secondary to exacerbation of his psychotic illness most likely secondary to noncompliance with his oral medicines.  He will be admitted to the hospital.  He will be integrated in the milieu.  He will be encouraged to attend groups.  He is willing to try oral risperidone, and has agreed to the long-acting injectable risperidone or paliperidone.  We will start him out on Risperdal 2 mg p.o. daily and 3 mg p.o. nightly.  If he tolerates the Risperdal we will go on and find out whether his insurance would cover either paliperidone or long-acting risperidone.  He is on metformin, but it is unclear whether he is on this for diabetes or for weight gain associated with antipsychotic medications.  The last hemoglobin A1c that we have in the chart is from 01/18/2020.  We will repeat that during the course of the hospitalization.  Currently he is also receiving diazepam 5 mg p.o. twice daily as needed.  This prescription was from 11/25/2020.  This was from Dr. 01/25/2021.  We will continue this for now.  He also apparently is on Trileptal 300 mg p.o. twice daily for mood stability and we will continue that as well.  Review of his admission laboratories showed a blood sugar of 105, creatinine of 1.10, and an increased AST at 70 and an ALT of 85.  It appears his liver function enzymes have been elevated at least for the last 3 weeks.  There is a laboratory from 11/07/2020 that with an AST of 181 and an ALT of 77.  We will try and  explore the that issue.  He did have a CK done, but he had been agitated in the emergency department and also received intermittent muscular injections.  His CK on admission was 1646, but his troponin was only 5.  Lipid panel was essentially normal.  CBC was normal.  Differential was normal.  His influenza A, B and coronavirus were all negative.  Acetaminophen was less than 10, salicylate less than 7.   Urinalysis showed rare bacteria but no white blood cells.  The rest of his urinalysis was essentially normal except for a moderate hematuria.  We will repeat that.  Blood alcohol was less than 10, drug screen was negative.  As stated above, his EKG showed a normal sinus rhythm with an incomplete right bundle branch block.  His QTc interval was normal.  This morning his vital signs are stable, he is afebrile, and oxygen saturation on room air was 100%.  I certify that inpatient services furnished can reasonably be expected to improve the patient's condition.   Antonieta Pert, MD 12/04/2020, 9:42 AM

## 2020-12-04 NOTE — Progress Notes (Signed)
Pt visible on the unit, pt paranoid and suspicious. Pt was asked to give urine sample, pt gave a sample that appeared to be water and was not warm. Pt asked to give another sample.     12/04/20 2200  Psych Admission Type (Psych Patients Only)  Admission Status Involuntary  Psychosocial Assessment  Patient Complaints Suspiciousness  Eye Contact Fair  Facial Expression Grimacing  Affect Anxious  Speech Tangential;Slurred  Interaction Assertive  Motor Activity Slow;Pacing  Appearance/Hygiene In scrubs  Behavior Characteristics Cooperative  Mood Suspicious;Preoccupied  Thought Process  Coherency Disorganized;Flight of ideas  Content Preoccupation  Delusions Grandeur  Perception Hallucinations  Hallucination Auditory  Judgment Impaired  Confusion Mild  Danger to Self  Current suicidal ideation? Denies  Danger to Others  Danger to Others None reported or observed

## 2020-12-04 NOTE — H&P (Signed)
Psychiatric Admission Assessment Adult  Patient Identification: Noah Ramsey MRN:  [id number] Date of Evaluation:  12/04/2020 Chief Complaint:  Schizoaffective disorder (HCC) [F25.9] Principal Diagnosis: <principal problem not specified> Diagnosis:  Active Problems:   Schizoaffective disorder (HCC)  History of Present Illness: Patient is seen and examined.  Patient is a 25 year old male with a reported past psychiatric history significant for schizoaffective disorder; bipolar type who originally presented to the [facility] emergency department on 11/29/2020 secondary to suicidal ideation, homicidal ideation, auditory hallucinations and not sleeping.  The patient had been recently discharged from the [facility] for similar complaints.  Apparently he had been given propranolol, diazepam and Latuda in an attempt to control his psychotic symptoms after he had been discharged from [facility].  The patient told me that he had been discharged on Abilify and the long-acting Abilify injection but that that had not worked well.  He is followed by Dr Carvel Getting as an outpatient and had been previously given the [place].  The patient admitted that he has a longstanding history of noncompliance.  He admitted to auditory hallucinations to me today.  He did deny suicidal or homicidal ideation.  He stated that he has been admitted to the psychiatric hospital between 10 and 15 times since he was diagnosed with schizophrenia versus schizoaffective disorder at age 64 or 54.  Collateral information obtained from his mother stated the patient had not been sleeping for 5 days, had increased aggression, and threats towards himself and others.  The patient stated "I feel like I am being harassed by my so-called stepfather, he thinks I had sex with my mom.  The patient stated that he had previously lived in [place] and moved here somewhere between 2018 and 2019.  He is currently not working.  He did  admit to previous use of substances including marijuana.  He was admitted to the hospital for evaluation and stabilization.  He stated he had been on long-acting injectable medication, and the review of the limited information that we have in the electronic medical record revealed that he had been on Haldol decanoate in the past.  He stated he prefer not to take that because it made him "stiff and cause problems with my bones".  He admitted to having previously been treated with the Latuda, Haldol, Abilify and others which he was unclear of.  On admission his EKG showed an incomplete right bundle branch block, but normal QTc interval and sinus rate.  No major abnormality secondary to the beta-blocker overdose.  Associated Signs/Symptoms: Depression Symptoms:  anhedonia, insomnia, psychomotor agitation, fatigue, difficulty concentrating, suicidal attempt, anxiety, disturbed sleep, Duration of Depression Symptoms: No data recorded (Hypo) Manic Symptoms:  Delusions, Distractibility, Hallucinations, Impulsivity, Irritable Mood, Labiality of Mood, Anxiety Symptoms:  Excessive Worry, Psychotic Symptoms:  Delusions, Hallucinations: Auditory Paranoia, PTSD Symptoms: Negative Total Time spent with patient: 30 minutes  Past Psychiatric History: Patient stated that he thought he had been admitted to the psychiatric hospital in the past at least 10-15 times.  His most recent hospitalization was approximately 1 to 2 weeks ago at [facility]  He had similar complaints at that time.  He has been followed by Dr. Vance Peper as an outpatient televideo wise.  Is the patient at risk to self? Yes.    Has the patient been a risk to self in the past 6 months? Yes.    Has the patient been a risk to self within the distant past? No.  Is  the patient a risk to others? Yes.    Has the patient been a risk to others in the past 6 months? Yes.    Has the patient been a risk to others within the distant  past? No.   Prior Inpatient Therapy:   Prior Outpatient Therapy:    Alcohol Screening: Patient refused Alcohol Screening Tool: Yes 1. How often do you have a drink containing alcohol?: Never 2. How many drinks containing alcohol do you have on a typical day when you are drinking?: 1 or 2 3. How often do you have six or more drinks on one occasion?: Never AUDIT-C Score: 0 4. How often during the last year have you found that you were not able to stop drinking once you had started?: Never 5. How often during the last year have you failed to do what was normally expected from you because of drinking?: Never 6. How often during the last year have you needed a first drink in the morning to get yourself going after a heavy drinking session?: Never 7. How often during the last year have you had a feeling of guilt of remorse after drinking?: Never 8. How often during the last year have you been unable to remember what happened the night before because you had been drinking?: Never 9. Have you or someone else been injured as a result of your drinking?: No 10. Has a relative or friend or a doctor or another health worker been concerned about your drinking or suggested you cut down?: No Alcohol Use Disorder Identification Test Final Score (AUDIT): 0 Substance Abuse History in the last 12 months:  No. Consequences of Substance Abuse: Negative Previous Psychotropic Medications: Yes  Psychological Evaluations: Yes  Past Medical History:  Past Medical History:  Diagnosis Date   Elevated CPK    per patient   Schizophrenia (HCC)    History reviewed. No pertinent surgical history. Family History:  Family History  Problem Relation Age of Onset   Mental illness Brother    Family Psychiatric  History: Noncontributory Tobacco Screening:   Social History:  Social History   Substance and Sexual Activity  Alcohol Use Never     Social History   Substance and Sexual Activity  Drug Use Never     Additional Social History:                           Allergies:  No Known Allergies Lab Results:  Results for orders placed or performed during the hospital encounter of 11/29/20 (from the past 48 hour(s))  CBC with Differential/Platelet     Status: None   Collection Time: 12/02/20  3:48 PM  Result Value Ref Range   WBC 5.8 4.0 - 10.5 K/uL   RBC 4.59 4.22 - 5.81 MIL/uL   Hemoglobin 14.6 13.0 - 17.0 g/dL   HCT 05.3 97.6 - 73.4 %   MCV 93.0 80.0 - 100.0 fL   MCH 31.8 26.0 - 34.0 pg   MCHC 34.2 30.0 - 36.0 g/dL   RDW 19.3 79.0 - 24.0 %   Platelets 300 150 - 400 K/uL   nRBC 0.0 0.0 - 0.2 %   Neutrophils Relative % 51 %   Neutro Abs 2.9 1.7 - 7.7 K/uL   Lymphocytes Relative 37 %   Lymphs Abs 2.2 0.7 - 4.0 K/uL   Monocytes Relative 9 %   Monocytes Absolute 0.5 0.1 - 1.0 K/uL   Eosinophils Relative 3 %  Eosinophils Absolute 0.2 0.0 - 0.5 K/uL   Basophils Relative 0 %   Basophils Absolute 0.0 0.0 - 0.1 K/uL   Immature Granulocytes 0 %   Abs Immature Granulocytes 0.01 0.00 - 0.07 K/uL    Comment: Performed at [facility], [address], [place], [place] [address]  Comprehensive metabolic panel     Status: Abnormal   Collection Time: 12/02/20  3:48 PM  Result Value Ref Range   Sodium 138 135 - 145 mmol/L   Potassium 4.1 3.5 - 5.1 mmol/L   Chloride 107 98 - 111 mmol/L   CO2 23 22 - 32 mmol/L   Glucose, Bld 105 (H) 70 - 99 mg/dL    Comment: Glucose reference range applies only to samples taken after fasting for at least 8 hours.   BUN 12 6 - 20 mg/dL   Creatinine, Ser 9.21 0.61 - 1.24 mg/dL   Calcium 9.5 8.9 - 19.4 mg/dL   Total Protein 7.7 6.5 - 8.1 g/dL   Albumin 4.0 3.5 - 5.0 g/dL   AST 70 (H) 15 - 41 U/L   ALT 85 (H) 0 - 44 U/L   Alkaline Phosphatase 97 38 - 126 U/L   Total Bilirubin 0.9 0.3 - 1.2 mg/dL   GFR, Estimated >17 >40 mL/min    Comment: (NOTE) Calculated using the CKD-EPI Creatinine Equation (2021)    Anion gap 8 5 - 15    Comment:  Performed at [facility], [address], [place], [place] [address]  CK     Status: Abnormal   Collection Time: 12/02/20  3:48 PM  Result Value Ref Range   Total CK 1,646 (H) 49 - 397 U/L    Comment: Performed at [facility], [address] [place], [place] [address]  Lipid panel     Status: Abnormal   Collection Time: 12/02/20  3:53 PM  Result Value Ref Range   Cholesterol 123 0 - 200 mg/dL   Triglycerides 74 <149 mg/dL   HDL 35 (L) >70 mg/dL   Total CHOL/HDL Ratio 3.5 RATIO   VLDL 15 0 - 40 mg/dL   LDL Cholesterol 73 0 - 99 mg/dL    Comment:        Total Cholesterol/HDL:CHD Risk Coronary Heart Disease Risk Table                     Men   Women  1/2 Average Risk   3.4   3.3  Average Risk       5.0   4.4  2 X Average Risk   9.6   7.1  3 X Average Risk  23.4   11.0        Use the calculated Patient Ratio above and the CHD Risk Table to determine the patient's CHD Risk.        ATP III CLASSIFICATION (LDL):  <100     mg/dL   Optimal  263-785  mg/dL   Near or Above                    Optimal  130-159  mg/dL   Borderline  885-027  mg/dL   High  >741     mg/dL   Very High Performed at [facility], [address] [place], [place] [address]   Troponin I (High Sensitivity)     Status: None   Collection Time: 12/02/20  3:53 PM  Result Value  Ref Range   Troponin I (High Sensitivity) 5 <18 ng/L    Comment: (NOTE) Elevated high sensitivity troponin I (hsTnI) values and significant  changes across serial measurements may suggest ACS but many other  chronic and acute conditions are known to elevate hsTnI results.  Refer to the "Links" section for chest pain algorithms and additional  guidance. Performed at [facility], [address] [place], [place] [address]   Resp Panel by RT-PCR (Flu A&B, Covid) Nasopharyngeal Swab     Status: None   Collection Time: 12/03/20  2:12 PM   Specimen: Nasopharyngeal Swab; Nasopharyngeal(NP) swabs in vial transport  medium  Result Value Ref Range   SARS Coronavirus 2 by RT PCR NEGATIVE NEGATIVE    Comment: (NOTE) SARS-CoV-2 target nucleic acids are NOT DETECTED.  The SARS-CoV-2 RNA is generally detectable in upper respiratory specimens during the acute phase of infection. The lowest concentration of SARS-CoV-2 viral copies this assay can detect is 138 copies/mL. A negative result does not preclude SARS-Cov-2 infection and should not be used as the sole basis for treatment or other patient management decisions. A negative result may occur with  improper specimen collection/handling, submission of specimen other than nasopharyngeal swab, presence of viral mutation(s) within the areas targeted by this assay, and inadequate number of viral copies(<138 copies/mL). A negative result must be combined with clinical observations, patient history, and epidemiological information. The expected result is Negative.  Fact Sheet for Patients:  BloggerCourse.comhttps://www.fda.gov/media/152166/download  Fact Sheet for Healthcare Providers:  SeriousBroker.ithttps://www.fda.gov/media/152162/download  This test is no t yet approved or cleared by the [place] FDA and  has been authorized for detection and/or diagnosis of SARS-CoV-2 by FDA under an Emergency Use Authorization (EUA). This EUA will remain  in effect (meaning this test can be used) for the duration of the COVID-19 declaration under Section 564(b)(1) of the Act, 21 U.S.C.section 360bbb-3(b)(1), unless the authorization is terminated  or revoked sooner.       Influenza A by PCR NEGATIVE NEGATIVE   Influenza B by PCR NEGATIVE NEGATIVE    Comment: (NOTE) The Xpert Xpress SARS-CoV-2/FLU/RSV plus assay is intended as an aid in the diagnosis of influenza from Nasopharyngeal swab specimens and should not be used as a sole basis for treatment. Nasal washings and aspirates are unacceptable for Xpert Xpress SARS-CoV-2/FLU/RSV testing.  Fact Sheet for  Patients: BloggerCourse.comhttps://www.fda.gov/media/152166/download  Fact Sheet for Healthcare Providers: SeriousBroker.ithttps://www.fda.gov/media/152162/download  This test is not yet approved or cleared by the [place] FDA and has been authorized for detection and/or diagnosis of SARS-CoV-2 by FDA under an Emergency Use Authorization (EUA). This EUA will remain in effect (meaning this test can be used) for the duration of the COVID-19 declaration under Section 564(b)(1) of the Act, 21 U.S.C. section 360bbb-3(b)(1), unless the authorization is terminated or revoked.  Performed at [facility], [address] [place], [place] [address]   CBG monitoring, ED     Status: None   Collection Time: 12/03/20  5:57 PM  Result Value Ref Range   Glucose-Capillary 96 70 - 99 mg/dL    Comment: Glucose reference range applies only to samples taken after fasting for at least 8 hours.    Blood Alcohol level:  Lab Results  Component Value Date   [facility]   ETH <10 11/07/2020    Metabolic Disorder Labs:  Lab Results  Component Value Date   HGBA1C 5.8 (H) 01/18/2020   No results found for: PROLACTIN Lab Results  Component Value Date   CHOL 123 12/02/2020   TRIG 74 12/02/2020   HDL 35 (L) 12/02/2020   CHOLHDL 3.5 12/02/2020   VLDL 15 12/02/2020   LDLCALC 73 12/02/2020   LDLCALC 101 (H) 06/03/2019    Current Medications: Current Facility-Administered Medications  Medication Dose Route Frequency Provider Last Rate Last Admin   acetaminophen (TYLENOL) tablet 650 mg  650 mg Oral Q6H PRN Rankin, Shuvon B, NP       alum & mag hydroxide-simeth (MAALOX/MYLANTA) 200-200-20 MG/5ML suspension 30 mL  30 mL Oral Q4H PRN Rankin, Shuvon B, NP       benztropine (COGENTIN) tablet 0.5 mg  0.5 mg Oral BID PRN Antonieta Pert, MD       diazepam (VALIUM) tablet 5 mg  5 mg Oral BID PRN Rankin, Shuvon B, NP   5 mg at 12/03/20 2312   OLANZapine zydis (ZYPREXA) disintegrating tablet 10 mg  10 mg Oral Q8H PRN  Antonieta Pert, MD       And   LORazepam (ATIVAN) tablet 1 mg  1 mg Oral Q6H PRN Antonieta Pert, MD       And   ziprasidone (GEODON) injection 20 mg  20 mg Intramuscular Q12H PRN Antonieta Pert, MD       magnesium hydroxide (MILK OF MAGNESIA) suspension 30 mL  30 mL Oral Daily PRN Rankin, Shuvon B, NP       metFORMIN (GLUCOPHAGE) tablet 500 mg  500 mg Oral BID WC Rankin, Shuvon B, NP   500 mg at 12/04/20 1610   Oxcarbazepine (TRILEPTAL) tablet 300 mg  300 mg Oral BID Rankin, Shuvon B, NP   300 mg at 12/04/20 9604   propranolol (INDERAL) tablet 40 mg  40 mg Oral TID Rankin, Shuvon B, NP   40 mg at 12/04/20 1200   risperiDONE (RISPERDAL M-TABS) disintegrating tablet 2 mg  2 mg Oral Daily Antonieta Pert, MD   2 mg at 12/04/20 1200   risperiDONE (RISPERDAL M-TABS) disintegrating tablet 3 mg  3 mg Oral QHS Antonieta Pert, MD       PTA Medications: Medications Prior to Admission  Medication Sig Dispense Refill Last Dose   diazepam (VALIUM) 5 MG tablet Take 5 mg by mouth 2 (two) times daily as needed for anxiety.      haloperidol (HALDOL) 10 MG tablet Take 10 mg by mouth at bedtime. (Patient not taking: No sig reported)      haloperidol decanoate (HALDOL DECANOATE) 100 MG/ML injection Inject 100 mg into the muscle every 28 (twenty-eight) days. (Patient not taking: No sig reported)      LATUDA 40 MG TABS tablet Take 40 mg by mouth at bedtime.      metFORMIN (GLUCOPHAGE) 500 MG tablet Take 1 tablet (500 mg total) by mouth 2 (two) times daily with a meal. (Patient not taking: No sig reported) 180 tablet 3    OVER THE COUNTER MEDICATION Ripped Fat Burner (diet pill)      OVER THE COUNTER MEDICATION Yohimbine (diet pill)      propranolol (INDERAL) 40 MG tablet Take 40 mg by mouth 3 (three) times daily.       Musculoskeletal: Strength & Muscle Tone: within normal limits Gait & Station: normal Patient leans: N/A            Psychiatric Specialty Exam:  Presentation   General Appearance: Fairly Groomed  Eye Contact:Fair  Speech:Normal Rate  Speech Volume:Normal  Handedness:Right   Mood  and Affect  Mood:Dysphoric  Affect:Flat   Thought Process  Thought Processes:Goal Directed  Duration of Psychotic Symptoms: Greater than six months  Past Diagnosis of Schizophrenia or Psychoactive disorder: Yes  Descriptions of Associations:Loose  Orientation:Full (Time, Place and Person)  Thought Content:Delusions; Paranoid Ideation  Hallucinations:Hallucinations: Auditory  Ideas of Reference:Delusions; Paranoia  Suicidal Thoughts:Suicidal Thoughts: No  Homicidal Thoughts:Homicidal Thoughts: No   Sensorium  Memory:Immediate Fair; Recent Fair; Remote Fair  Judgment:Impaired  Insight:Fair   Executive Functions  Concentration:Fair  Attention Span:Fair  Recall:Fair  Fund of Knowledge:Fair  Language:Fair   Psychomotor Activity  Psychomotor Activity:Psychomotor Activity: Normal   Assets  Assets:Desire for Improvement; Resilience; Housing; Social Support   Sleep  Sleep:Sleep: Poor    Physical Exam: Physical Exam Vitals and nursing note reviewed.  Constitutional:      Appearance: He is obese.  HENT:     Head: Normocephalic and atraumatic.  Pulmonary:     Effort: Pulmonary effort is normal.  Neurological:     General: No focal deficit present.     Mental Status: He is alert and oriented to person, place, and time.   Review of Systems  All other systems reviewed and are negative. Blood pressure 127/82, pulse 78, temperature 97.8 F (36.6 C), temperature source Oral, resp. rate 16, height 5\' 6"  (1.676 m), weight 134.3 kg, SpO2 100 %. Body mass index is 47.78 kg/m.  Treatment Plan Summary: Patient is seen and examined.  Patient is a 25 year old male with the above-stated past psychiatric history who was admitted secondary to exacerbation of his psychotic illness most likely secondary to noncompliance with his oral  medicines.  He will be admitted to the hospital.  He will be integrated in the milieu.  He will be encouraged to attend groups.  He is willing to try oral risperidone, and has agreed to the long-acting injectable risperidone or paliperidone.  We will start him out on Risperdal 2 mg p.o. daily and 3 mg p.o. nightly.  If he tolerates the Risperdal we will go on and find out whether his insurance would cover either paliperidone or long-acting risperidone.  He is on metformin, but it is unclear whether he is on this for diabetes or for weight gain associated with antipsychotic medications.  The last hemoglobin A1c that we have in the chart is from 01/18/2020.  We will repeat that during the course of the hospitalization.  Currently he is also receiving diazepam 5 mg p.o. twice daily as needed.  This prescription was from 11/25/2020.  This was from Dr. 01/25/2021.  We will continue this for now.  He also apparently is on Trileptal 300 mg p.o. twice daily for mood stability and we will continue that as well.  Review of his admission laboratories showed a blood sugar of 105, creatinine of 1.10, and an increased AST at 70 and an ALT of 85.  It appears his liver function enzymes have been elevated at least for the last 3 weeks.  There is a laboratory from 11/07/2020 that with an AST of 181 and an ALT of 77.  We will try and explore the that issue.  He did have a CK done, but he had been agitated in the emergency department and also received intermittent muscular injections.  His CK on admission was 1646, but his troponin was only 5.  Lipid panel was essentially normal.  CBC was normal.  Differential was normal.  His influenza A, B and coronavirus were all negative.  Acetaminophen was less than  10, salicylate less than 7.  Urinalysis showed rare bacteria but no white blood cells.  The rest of his urinalysis was essentially normal except for a moderate hematuria.  We will repeat that.  Blood alcohol was less than 10, drug screen was  negative.  As stated above, his EKG showed a normal sinus rhythm with an incomplete right bundle branch block.  His QTc interval was normal.  This morning his vital signs are stable, he is afebrile, and oxygen saturation on room air was 100%.  Observation Level/Precautions:  15 minute checks  Laboratory:  CBC Chemistry Profile HbAIC UDS UA  Psychotherapy:    Medications:    Consultations:    Discharge Concerns:    Estimated LOS:  Other:     Physician Treatment Plan for Primary Diagnosis: <principal problem not specified> Long Term Goal(s): Improvement in symptoms so as ready for discharge  Short Term Goals: Ability to identify changes in lifestyle to reduce recurrence of condition will improve, Ability to verbalize feelings will improve, Ability to disclose and discuss suicidal ideas, Ability to demonstrate self-control will improve, Ability to identify and develop effective coping behaviors will improve, Ability to maintain clinical measurements within normal limits will improve, and Compliance with prescribed medications will improve  Physician Treatment Plan for Secondary Diagnosis: Active Problems:   Schizoaffective disorder (HCC)  Long Term Goal(s): Improvement in symptoms so as ready for discharge  Short Term Goals: Ability to identify changes in lifestyle to reduce recurrence of condition will improve, Ability to verbalize feelings will improve, Ability to disclose and discuss suicidal ideas, Ability to demonstrate self-control will improve, Ability to identify and develop effective coping behaviors will improve, Ability to maintain clinical measurements within normal limits will improve, and Compliance with prescribed medications will improve  I certify that inpatient services furnished can reasonably be expected to improve the patient's condition.    Antonieta Pert, MD 8/15/20221:04 PM

## 2020-12-04 NOTE — Tx Team (Signed)
Initial Treatment Plan 12/04/2020 2:36 AM [name] TDS:[id number]    PATIENT STRESSORS: Financial difficulties Medication change or noncompliance   PATIENT STRENGTHS: Communication skills Supportive family/friends   PATIENT IDENTIFIED PROBLEMS: Depression  Anxiety  "Fitness"  " Talk to the social worker about life."               DISCHARGE CRITERIA:  Ability to meet basic life and health needs Improved stabilization in mood, thinking, and/or behavior Medical problems require only outpatient monitoring Motivation to continue treatment in a less acute level of care  PRELIMINARY DISCHARGE PLAN: Attend aftercare/continuing care group Attend PHP/IOP Outpatient therapy Return to previous living arrangement  PATIENT/FAMILY INVOLVEMENT: This treatment plan has been presented to and reviewed with the patient, Noah Ramsey, and/or family member.  The patient and family have been given the opportunity to ask questions and make suggestions.  Bethann Punches, RN 12/04/2020, 2:36 AM
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
## 2020-12-04 NOTE — Progress Notes (Signed)
Pt has been consistently talking about his libido and sexual things/gestures to staff and other patients. Pt has been redirected but when redirected, he seems irritable. Pt will continue to be monitored and redirected as needed.

## 2020-12-04 NOTE — Progress Notes (Signed)
Noah Ramsey is a 25 y.o. male involuntarily admitted for insomnia, increased aggression, threats towards others and himself. Pt present with delusion and disorganized thoughts, observed talking to himself and making gestures. Pt kept asking if every body was straight in this hospital, pt requested for all male to take care of him as opposed to male stating that he is not gay. Pt has been cooperative with the admission process, denied SI/HI and contracted for safety. Consents signed, skin search completed and pt oriented to unit. Pt stable at this time. Pt given the opportunity to express concerns and ask questions. Pt given toiletries. Will continue to monitor.

## 2020-12-05 DIAGNOSIS — F250 Schizoaffective disorder, bipolar type: Secondary | ICD-10-CM | POA: Diagnosis not present

## 2020-12-05 LAB — COMPREHENSIVE METABOLIC PANEL
ALT: 58 U/L — ABNORMAL HIGH (ref 0–44)
AST: 42 U/L — ABNORMAL HIGH (ref 15–41)
Albumin: 3.8 g/dL (ref 3.5–5.0)
Alkaline Phosphatase: 88 U/L (ref 38–126)
Anion gap: 9 (ref 5–15)
BUN: 11 mg/dL (ref 6–20)
CO2: 25 mmol/L (ref 22–32)
Calcium: 9.2 mg/dL (ref 8.9–10.3)
Chloride: 107 mmol/L (ref 98–111)
Creatinine, Ser: 0.74 mg/dL (ref 0.61–1.24)
GFR, Estimated: >60 mL/min (ref >60)
Glucose, Bld: 94 mg/dL (ref 70–99)
Potassium: 4 mmol/L (ref 3.5–5.1)
Sodium: 141 mmol/L (ref 135–145)
Total Bilirubin: 0.5 mg/dL (ref 0.3–1.2)
Total Protein: 7.4 g/dL (ref 6.5–8.1)

## 2020-12-05 LAB — HEMOGLOBIN A1C
Hgb A1c MFr Bld: 5.5 % (ref 4.8–5.6)
Mean Plasma Glucose: 111.15 mg/dL

## 2020-12-05 LAB — URINALYSIS, COMPLETE (UACMP) WITH MICROSCOPIC
Bacteria, UA: NONE SEEN
Bilirubin Urine: NEGATIVE
Glucose, UA: NEGATIVE mg/dL
Hgb urine dipstick: NEGATIVE
Ketones, ur: NEGATIVE mg/dL
Leukocytes,Ua: NEGATIVE
Nitrite: NEGATIVE
Protein, ur: NEGATIVE mg/dL
Specific Gravity, Urine: 1.02 (ref 1.005–1.030)
pH: 5 (ref 5.0–8.0)

## 2020-12-05 MED ORDER — DIPHENHYDRAMINE HCL 50 MG/ML IJ SOLN: 50.0000 mg | Freq: Four times a day (QID) | INTRAMUSCULAR | Status: DC | PRN
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
## 2020-12-05 NOTE — [facility] Suicide Risk Assessment (Signed)
[facility] INPATIENT:  Family/Significant Other Suicide Prevention Education  Suicide Prevention Education:  Education Completed; mother Noah Ramsey [phone]), has been identified by the patient as the family member/significant other with whom the patient will be residing, and identified as the person(s) who will aid the patient in the event of Noah mental health crisis (suicidal ideations/suicide attempt).  With written consent from the patient, the family member/significant other has been provided the following suicide prevention education, prior to the and/or following the discharge of the patient.   The suicide prevention education provided includes the following: Suicide risk factors Suicide prevention and interventions National Suicide Hotline telephone number [facility] assessment telephone number [facility] Emergency Assistance 911 [facility] and/or Residential Mobile Crisis Unit telephone number  Request made of family/significant other to: Remove weapons (e.g., guns, rifles, knives), all items previously/currently identified as safety concern.   Remove drugs/medications (over-the-counter, prescriptions, illicit drugs), all items previously/currently identified as Noah safety concern.  The family member/significant other verbalizes understanding of the suicide prevention education information provided.  The family member/significant other agrees to remove the items of safety concern listed above.  Noah Ramsey Noah Ramsey 12/05/2020, 10:22 AM

## 2020-12-05 NOTE — Progress Notes (Signed)
Pt did not attend orientation/goals group. 

## 2020-12-05 NOTE — Progress Notes (Signed)
Adult Psychoeducational Group Note  Date:  12/05/2020 Time:  11:43 PM  Group Topic/Focus:  Wrap-Up Group:   The focus of this group is to help patients review their daily goal of treatment and discuss progress on daily workbooks.  Participation Level:  Minimal  Participation Quality:  Appropriate  Affect:  Anxious  Cognitive:  Disorganized and Confused  Insight: Lacking and Limited  Engagement in Group:  Lacking, Limited, and Poor  Modes of Intervention:  Discussion  Additional Comments:  Pt stated his goal for today was to focus on his treatment plan. Pt stated he accomplished his goal today. Pt stated he talked with his doctor and social worker about his care today. Pt rated his overall day a 10. Pt stated he made calls to his mother, step father, brother, cousin, and aunt which improved his overall day. Pt stated he felt better about himself today. Pt stated he was brought back all meals today by staff. Pt stated he took all medications provided today. Pt stated he attend all groups held today. Pt stated his appetite was pretty good today. Pt rated sleep last night was pretty good. Pt stated the goal tonight was to get some rest. Pt stated he had no physical pain today. Pt deny visual hallucinations and auditory issues tonight. Pt denies thoughts of harming himself or others. Pt stated he would alert staff if anything changed.  [name] 12/05/2020, 11:43 PM

## 2020-12-05 NOTE — Progress Notes (Signed)
Pt refused 5 oclock meds and pt took Metformin and Trileptal with HS medications. Pt continued to pace and have hard time laying down so pt was given PRN Zyprexa per [facility].     12/05/20 2300  Psych Admission Type (Psych Patients Only)  Admission Status Involuntary  Psychosocial Assessment  Patient Complaints Suspiciousness  Eye Contact Fair  Facial Expression Grimacing  Affect Anxious  Speech Tangential;Slurred  Interaction Assertive  Motor Activity Slow;Pacing  Appearance/Hygiene In scrubs  Behavior Characteristics Pacing  Mood Suspicious;Preoccupied  Thought Process  Coherency Disorganized;Flight of ideas  Content Preoccupation  Delusions Grandeur  Perception Hallucinations  Hallucination Auditory  Judgment Impaired  Confusion Mild  Danger to Self  Current suicidal ideation? Denies  Danger to Others  Danger to Others None reported or observed

## 2020-12-05 NOTE — Progress Notes (Signed)
Recreation Therapy Notes  Date: 8.16.22 Time: 1000 Location: 500 Hall Dayroom  Group Topic: Wellness  Goal Area(s) Addresses:  Patient will define components of whole wellness. Patient will verbalize benefit of whole wellness.  Behavioral Response: None  Intervention: Wellness, Music  Activity: Exercise.  LRT led group in a series of stretches to get loosened up before doing exercises.  Patients took turns leading group in exercises of their choosing.  Modified versions of exercises were shown so patients who had a hard time completing some of the exercises would still be able to attend.  LRT encouraged patients to take breaks or get water if needed.  LRT and patients were going for at least 30 good minutes of exercise.  Education: Wellness, Building control surveyor.   Education Outcome: Acknowledges education/In group clarification offered/Needs additional education.   Clinical Observations/Feedback:  Pt attended group session but did not participate.  Pt left early and did not return.     Caroll Rancher, LRT/CTRS         Caroll Rancher A 12/05/2020 12:43 PM

## 2020-12-06 DIAGNOSIS — F250 Schizoaffective disorder, bipolar type: Secondary | ICD-10-CM | POA: Diagnosis not present

## 2020-12-06 LAB — TSH: TSH: 5.767 u[IU]/mL — ABNORMAL HIGH (ref 0.350–4.500)

## 2020-12-06 MED ORDER — ZIPRASIDONE MESYLATE 20 MG IM SOLR
20.0000 mg | Freq: Two times a day (BID) | INTRAMUSCULAR | Status: DC | PRN
  Administered 2020-12-06: 20 mg via INTRAMUSCULAR
  Filled 2020-12-06: qty 20

## 2020-12-06 MED ORDER — OLANZAPINE 5 MG PO TBDP: 5.0000 mg | ORAL_TABLET | Freq: Three times a day (TID) | ORAL | Status: DC | PRN

## 2020-12-06 MED ORDER — BENZTROPINE MESYLATE 0.5 MG PO TABS
0.5000 mg | ORAL_TABLET | Freq: Two times a day (BID) | ORAL | Status: DC
  Administered 2020-12-06 – 2020-12-11 (×10): 0.5 mg via ORAL
  Filled 2020-12-06 (×16): qty 1

## 2020-12-06 MED ORDER — LORAZEPAM 1 MG PO TABS: 1.0000 mg | ORAL_TABLET | Freq: Four times a day (QID) | ORAL | Status: DC | PRN

## 2020-12-06 NOTE — [facility] Group Notes (Signed)
[facility] LCSW Group Therapy Note   Type of Therapy and Topic:  Group Therapy:  Strengths and Qualities  Participation Level:  Active: Minimal  Description of Group:    In this group patients will be asked to explore and define the terms strength ans qualities.  Patients will be guided to discuss their thoughts, feelings, and behaviors as to where strengths and qualities originate. Participants will then list some of their strengths and qualities related to each subject topic. This group will be process-oriented, with patients participating in exploration of their own experiences as well as giving and receiving support and challenge from other group members.  Therapeutic Goals: Patient will identify specific strengths related to their personal life. Patient will identify feelings, thoughts, and beliefs about strengths and qualities. Patient will identify ways their strengths have been used. . Patient will identify situations where they have helped others or made someone else happy. .  Summary of Patient Progress Patient participated in group on today. Patient was disorganized and was unable to answer questions directly as they were being asked.  Patient was unable to identify strengths or ways he would like to develop strengths.  Patient did participate in discussion about hobbies and reports that he enjoys listening to music.      Therapeutic Modalities:   Cognitive Behavioral Therapy Solution Focused Therapy Motivational Interviewing Brief Therapy   Quaron Delacruz, LCSW, LCAS Clincal Social Worker  [facility]

## 2020-12-06 NOTE — Progress Notes (Addendum)
Pt continued to posture with staff"I'm a pit bull, I'm not afraid to die, none of you care about me" pt was de-escalated by staff and pt given PRN IM Geodon per [facility] without incident . 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

## 2020-12-06 NOTE — Progress Notes (Signed)
Pt continues to to come to the nursing station, pt stated he wanted to be transferred to [place]. Pt up at the nursing station trying to intimidate staff. Pt up posturing with staff. Pt stated " I want L-arginine" pt up with his fist balled up continuing to posture with staff. Pt up stating he want to D/C right now

## 2020-12-06 NOTE — Plan of Care (Signed)
  Problem: Activity: Goal: Interest or engagement in leisure activities will improve Outcome: Progressing Goal: Imbalance in normal sleep/wake cycle will improve Outcome: Progressing   Problem: Education: Goal: Knowledge of [place] [facility] Education information/materials will improve Outcome: Progressing

## 2020-12-06 NOTE — Progress Notes (Signed)
[facility] MD Progress Note  12/06/2020 4:42 PM Noah Ramsey  MRN:  [id number]  Reason for admission: Patient is a 25 year old male with history of schizoaffective disorder admitted for insomnia x 5 days, aggressive behavior, delusions, suicidal ideation and auditory hallucinations.  Objective: Medical record reviewed.  Patient's case discussed in detail with members of the treatment team.  I met with and evaluated the patient on the unit today for follow-up.  Patient is similar in presentation to yesterday.  He is receptive to speaking with me.  He continues to make loosely related delusional statements with grandiose, paranoid, hyperreligious and hypersexual themes.  He speaks of being a vampire, Lucifer, a physician, having "pure blood" that is the cure for cancer, etc.  On several occasions during our conversation patient snaps his fingers or claps his hands in front of himself and stares at me.  When asked about these actions, the patient states that by engaging in these actions he is providing me with his knowledge.  His thought processes remain loose and tangential.  He denies SI, AI or HI.  He reports paranoid concerns that others are trying to harm him but does not elaborate.  He denies any side effects to his medication but states he would like to take medications by injection.  The patient slept 6.75 hours last night.  Vital signs this morning included BP of 122/82 sitting and 121/102 standing; pulse of 93 sitting and 96 standing; O2 sat of 98% on room air and temperature of 97.5.  Labs this morning include mildly elevated TSH of 5.767.  Patient took scheduled medications and received PRN Zyprexa last night for agitation.  Principal Problem: Schizoaffective disorder ([place]) Diagnosis: Principal Problem:   Schizoaffective disorder ([place])  Total Time spent with patient:  25 minutes  Past Psychiatric History: See admission H&P  Past Medical History:  Past Medical History:  Diagnosis Date    Elevated CPK    per patient   Schizophrenia ([place])    History reviewed. No pertinent surgical history. Family History:  Family History  Problem Relation Age of Onset   Mental illness Brother    Family Psychiatric  History: See admission H&P Social History:  Social History   Substance and Sexual Activity  Alcohol Use Never     Social History   Substance and Sexual Activity  Drug Use Never    Social History   Socioeconomic History   Marital status: Single    Spouse name: Not on file   Number of children: Not on file   Years of education: Not on file   Highest education level: Not on file  Occupational History   Not on file  Tobacco Use   Smoking status: Never   Smokeless tobacco: Never  Substance and Sexual Activity   Alcohol use: Never   Drug use: Never   Sexual activity: Not on file  Other Topics Concern   Not on file  Social History Narrative   Not on file   Social Determinants of Health   Financial Resource Strain: Not on file  Food Insecurity: Not on file  Transportation Needs: Not on file  Physical Activity: Not on file  Stress: Not on file  Social Connections: Not on file   Additional Social History:                         Sleep: Good  Appetite:  Good  Current Medications: Current Facility-Administered Medications  Medication Dose Route Frequency Provider  Last Rate Last Admin   acetaminophen (TYLENOL) tablet 650 mg  650 mg Oral Q6H PRN Rankin, Shuvon B, NP   650 mg at 12/06/20 0611   alum & mag hydroxide-simeth (MAALOX/MYLANTA) 200-200-20 MG/5ML suspension 30 mL  30 mL Oral Q4H PRN Rankin, Shuvon B, NP       benztropine (COGENTIN) tablet 0.5 mg  0.5 mg Oral BID PRN Sharma Covert, MD   0.5 mg at 12/05/20 2240   diazepam (VALIUM) tablet 5 mg  5 mg Oral BID PRN Rankin, Shuvon B, NP   5 mg at 12/05/20 2045   diphenhydrAMINE (BENADRYL) injection 50 mg  50 mg Intramuscular Q6H PRN Arthor Captain, MD       OLANZapine zydis (ZYPREXA)  disintegrating tablet 10 mg  10 mg Oral Q8H PRN Sharma Covert, MD   10 mg at 12/05/20 2239   And   LORazepam (ATIVAN) tablet 1 mg  1 mg Oral Q6H PRN Sharma Covert, MD       And   ziprasidone (GEODON) injection 20 mg  20 mg Intramuscular Q12H PRN Sharma Covert, MD       magnesium hydroxide (MILK OF MAGNESIA) suspension 30 mL  30 mL Oral Daily PRN Rankin, Shuvon B, NP       metFORMIN (GLUCOPHAGE) tablet 500 mg  500 mg Oral BID WC Rankin, Shuvon B, NP   500 mg at 12/06/20 0754   Oxcarbazepine (TRILEPTAL) tablet 300 mg  300 mg Oral BID Rankin, Shuvon B, NP   300 mg at 12/06/20 0754   propranolol (INDERAL) tablet 40 mg  40 mg Oral TID Rankin, Shuvon B, NP   40 mg at 12/06/20 1242   risperiDONE (RISPERDAL M-TABS) disintegrating tablet 2 mg  2 mg Oral Daily Sharma Covert, MD   2 mg at 12/06/20 0754   risperiDONE (RISPERDAL M-TABS) disintegrating tablet 3 mg  3 mg Oral QHS Sharma Covert, MD   3 mg at 12/05/20 2045    Lab Results:  Results for orders placed or performed during the hospital encounter of 12/03/20 (from the past 48 hour(s))  Hemoglobin A1c     Status: None   Collection Time: 12/04/20  6:35 PM  Result Value Ref Range   Hgb A1c MFr Bld 5.5 4.8 - 5.6 %    Comment: (NOTE) Pre diabetes:          5.7%-6.4%  Diabetes:              >6.4%  Glycemic control for   <7.0% adults with diabetes    Mean Plasma Glucose 111.15 mg/dL    Comment: Performed at [facility], [place] [address], [place], [place] [address]  Comprehensive metabolic panel     Status: Abnormal   Collection Time: 12/05/20  6:28 AM  Result Value Ref Range   Sodium 141 135 - 145 mmol/L   Potassium 4.0 3.5 - 5.1 mmol/L   Chloride 107 98 - 111 mmol/L   CO2 25 22 - 32 mmol/L   Glucose, Bld 94 70 - 99 mg/dL    Comment: Glucose reference range applies only to samples taken after fasting for at least 8 hours.   BUN 11 6 - 20 mg/dL   Creatinine, Ser 0.74 0.61 - 1.24 mg/dL   Calcium 9.2 8.9 - 10.3  mg/dL   Total Protein 7.4 6.5 - 8.1 g/dL   Albumin 3.8 3.5 - 5.0 g/dL   AST 42 (H) 15 - 41  U/L   ALT 58 (H) 0 - 44 U/L   Alkaline Phosphatase 88 38 - 126 U/L   Total Bilirubin 0.5 0.3 - 1.2 mg/dL   GFR, Estimated >60 >60 mL/min    Comment: (NOTE) Calculated using the CKD-EPI Creatinine Equation (2021)    Anion gap 9 5 - 15    Comment: Performed at [facility], [place] [address] [place], [place] [address]  Urinalysis, Complete w Microscopic Urine, Clean Catch     Status: None   Collection Time: 12/05/20  6:55 AM  Result Value Ref Range   Color, Urine YELLOW YELLOW   APPearance CLEAR CLEAR   Specific Gravity, Urine 1.020 1.005 - 1.030   pH 5.0 5.0 - 8.0   Glucose, UA NEGATIVE NEGATIVE mg/dL   Hgb urine dipstick NEGATIVE NEGATIVE   Bilirubin Urine NEGATIVE NEGATIVE   Ketones, ur NEGATIVE NEGATIVE mg/dL   Protein, ur NEGATIVE NEGATIVE mg/dL   Nitrite NEGATIVE NEGATIVE   Leukocytes,Ua NEGATIVE NEGATIVE   RBC / HPF 0-5 0 - 5 RBC/hpf   WBC, UA 0-5 0 - 5 WBC/hpf   Bacteria, UA NONE SEEN NONE SEEN   Squamous Epithelial / LPF 0-5 0 - 5    Comment: Performed at [facility], [place] [address], [place], [place] [address]  TSH     Status: Abnormal   Collection Time: 12/06/20  6:53 AM  Result Value Ref Range   TSH 5.767 (H) 0.350 - 4.500 uIU/mL    Comment: Performed by a 3rd Generation assay with a functional sensitivity of <=0.01 uIU/mL. Performed at [facility], [place] [address], [place],  [address]     Blood Alcohol level:  Lab Results  Component Value Date   [facility] <10 11/29/2020   ETH <10 37/16/9678    Metabolic Disorder Labs: Lab Results  Component Value Date   HGBA1C 5.5 12/04/2020   MPG 111.15 12/04/2020   No results found for: PROLACTIN Lab Results  Component Value Date   CHOL 123 12/02/2020   TRIG 74 12/02/2020   HDL 35 (L) 12/02/2020   CHOLHDL 3.5 12/02/2020   VLDL 15 12/02/2020   LDLCALC 73 12/02/2020    LDLCALC 101 (H) 06/03/2019    Physical Findings: AIMS: Facial and Oral Movements Muscles of Facial Expression: None, normal Lips and Perioral Area: None, normal Jaw: None, normal Tongue: None, normal,Extremity Movements Upper (arms, wrists, hands, fingers): None, normal Lower (legs, knees, ankles, toes): None, normal, Trunk Movements Neck, shoulders, hips: None, normal, Overall Severity Severity of abnormal movements (highest score from questions above): None, normal Incapacitation due to abnormal movements: None, normal Patient's awareness of abnormal movements (rate only patient's report): No Awareness, Dental Status Current problems with teeth and/or dentures?: No Does patient usually wear dentures?: No  CIWA:    COWS:     Musculoskeletal: Strength & Muscle Tone: within normal limits Gait & Station: normal Patient leans: N/A  Psychiatric Specialty Exam:  Presentation  General Appearance: Fairly Groomed  Eye Contact:Fair; Other (comment) (Intermittent intense staring)  Speech:Normal Rate; Other (comment) (Poor articulation; difficult to understand at times)  Speech Volume:Normal  Handedness:Right   Mood and Affect  Mood:Anxious  Affect:Flat   Thought Process  Thought Processes:Coherent  Descriptions of Associations:Loose  Orientation:Full (Time, Place and Person)  Thought Content:Delusions; Paranoid Ideation; Illogical (Grandiose, hyperreligious, sexual and paranoid themes)  History of Schizophrenia/Schizoaffective disorder:Yes  Duration of Psychotic Symptoms:Greater than six months  Hallucinations:Hallucinations: Auditory  Ideas of Reference:Delusions; Paranoia  Suicidal  Thoughts:Suicidal Thoughts: No  Homicidal Thoughts:Homicidal Thoughts: No   Sensorium  Memory:Immediate Fair; Recent Fair  Judgment:Impaired  Insight:Shallow   Executive Functions  Concentration:Fair  Attention Span:Fair  [place]   Psychomotor Activity  Psychomotor Activity:Psychomotor Activity: Normal   Assets  Assets:Desire for Improvement; Housing; Social Support   Sleep  Sleep:Sleep: Good Number of Hours of Sleep: 6.75    Physical Exam: Physical Exam Vitals and nursing note reviewed.  Constitutional:      General: He is not in acute distress.    Appearance: He is not diaphoretic.  HENT:     Head: Normocephalic and atraumatic.  Cardiovascular:     Rate and Rhythm: Normal rate.  Pulmonary:     Effort: Pulmonary effort is normal.  Neurological:     General: No focal deficit present.     Mental Status: He is alert and oriented to person, place, and time.   Review of Systems  Constitutional:  Negative for chills, diaphoresis and fever.  HENT:  Negative for sore throat.   Respiratory:  Negative for cough and shortness of breath.   Cardiovascular:  Negative for chest pain and palpitations.  Gastrointestinal:  Negative for constipation, diarrhea, nausea and vomiting.  Musculoskeletal:  Negative for myalgias.  Neurological:  Negative for dizziness, seizures and headaches.  Psychiatric/Behavioral:  Positive for hallucinations. Negative for depression, substance abuse and suicidal ideas. The patient is nervous/anxious. The patient does not have insomnia.   Blood pressure 121/73, pulse 89, temperature 97.6 F (36.4 C), temperature source Oral, resp. rate 16, height 5' 6"  (1.676 m), weight 134.3 kg, SpO2 99 %. Body mass index is 47.78 kg/m.   Treatment Plan Summary: Patient is a 25 year old male with schizoaffective disorder, bipolar type admitted for treatment and stabilization of worsening psychotic and mood symptoms, suicidal ideation and aggressive behavior.  Daily contact with patient to assess and evaluate symptoms and progress in treatment and Medication management  Schizoaffective disorder -Continue Risperdal 2 mg daily and 3 mg nightly for psychotic symptoms  and mood stabilization for now.  Will consider possible change to Prolixin if patient does not start to show approval -Goal is for eventual transition to long-acting injectable antipsychotic -Continue Trileptal 300 mg twice daily for mood stabilization  Anxiety/EPS -Continue propranolol 40 mg 3 times daily for restlessness and anxiety -Continue diazepam 5 mg BID PRN anxiety -Continue benztropine 0.5 mg twice daily as needed tremors -Start benztropine 0.5 mg twice daily for muscle stiffness -Start diphenhydramine 50 mg IM every 6 hours as needed acute dystonic reaction  Diabetes Mellitus -Continue metformin 500 mg twice daily with meals  Agitation protocol per [facility]  Discharge planning in progress  Arthor Captain, MD 12/06/2020, 4:42 PM

## 2020-12-06 NOTE — Progress Notes (Addendum)
Progress note  Pt found in the hallway pacing; pt compliant with medication administration. Pt denies any physical complaints or pain. Pt is difficult to understand at times. Pt can be intrusive at times but is pleasant. Pt has been seen in the dayroom and group, but they usually don't stay long. Pt denies si/hi/vh and verbally agrees to approach staff if these become apparent or before harming themselves/others while at [facility].  A: Pt provided support and encouragement. Pt given medication per protocol and standing orders. Q59m safety checks implemented and continued.  R: Pt safe on the unit. Will continue to monitor.

## 2020-12-06 NOTE — Progress Notes (Addendum)
Pt comes to the nursing station numerous times with somatic complaints that are not relevent to his Tx. Pt continues to try to find something to complain about. " I don't like the pain killers ya'll giving me, they make my legs hurt , that Seroquel is not like L-arginine " pt informed he did not take either of the medication he mentioned. Pt continues to pace the hall talking to people not seen by staff. Pt continues to be paranoid. Pt given PRN Valium per MAR with HS medication.     12/06/20 2100  Psych Admission Type (Psych Patients Only)  Admission Status Involuntary  Psychosocial Assessment  Patient Complaints Suspiciousness;Worrying;Anxiety  Eye Contact Fair  Facial Expression Anxious;Pensive  Affect Anxious;Preoccupied  Speech Slurred;Tangential  Interaction Assertive;Sexually inappropriate  Motor Activity Pacing;Slow  Appearance/Hygiene In scrubs  Behavior Characteristics Cooperative;Pacing  Mood Suspicious;Anxious  Thought Process  Coherency Blocking;Disorganized;Flight of ideas  Content Preoccupation  Delusions Grandeur  Perception Hallucinations  Hallucination Auditory  Judgment Poor  Confusion Mild  Danger to Self  Current suicidal ideation? Denies  Danger to Others  Danger to Others None reported or observed

## 2020-12-07 DIAGNOSIS — F250 Schizoaffective disorder, bipolar type: Secondary | ICD-10-CM | POA: Diagnosis not present

## 2020-12-07 MED ORDER — LORAZEPAM 2 MG/ML IJ SOLN: 2.0000 mg | Freq: Two times a day (BID) | INTRAMUSCULAR | Status: DC | PRN

## 2020-12-07 MED ORDER — FLUPHENAZINE HCL 5 MG PO TABS: 5.0000 mg | ORAL_TABLET | Freq: Every day | ORAL | Status: DC

## 2020-12-07 MED ORDER — TRAZODONE HCL 100 MG PO TABS
100.0000 mg | ORAL_TABLET | Freq: Once | ORAL | Status: AC
  Administered 2020-12-07: 100 mg via ORAL
  Filled 2020-12-07 (×2): qty 1

## 2020-12-07 MED ORDER — FLUPHENAZINE HCL 5 MG PO TABS
5.0000 mg | ORAL_TABLET | Freq: Every evening | ORAL | Status: DC
  Administered 2020-12-07: 5 mg via ORAL
  Filled 2020-12-07 (×2): qty 1

## 2020-12-07 MED ORDER — FLUPHENAZINE HCL 10 MG PO TABS
10.0000 mg | ORAL_TABLET | Freq: Every day | ORAL | Status: DC
  Administered 2020-12-07 – 2020-12-12 (×6): 10 mg via ORAL
  Filled 2020-12-07: qty 1
  Filled 2020-12-07: qty 2
  Filled 2020-12-07 (×7): qty 1

## 2020-12-07 MED ORDER — LORAZEPAM 1 MG PO TABS: 1.0000 mg | ORAL_TABLET | Freq: Two times a day (BID) | ORAL | Status: DC | PRN

## 2020-12-07 MED ORDER — LORAZEPAM 1 MG PO TABS
1.0000 mg | ORAL_TABLET | Freq: Two times a day (BID) | ORAL | Status: DC | PRN
  Administered 2020-12-07: 1 mg via ORAL
  Filled 2020-12-07: qty 1

## 2020-12-07 MED ORDER — ENSURE ENLIVE PO LIQD
237.0000 mL | Freq: Two times a day (BID) | ORAL | Status: DC
  Administered 2020-12-07 – 2020-12-10 (×7): 237 mL via ORAL
  Filled 2020-12-07 (×12): qty 237

## 2020-12-07 MED ORDER — OLANZAPINE 5 MG PO TBDP: 5.0000 mg | ORAL_TABLET | Freq: Three times a day (TID) | ORAL | Status: DC | PRN

## 2020-12-07 MED ORDER — TRAZODONE HCL 100 MG PO TABS
100.0000 mg | ORAL_TABLET | Freq: Once | ORAL | Status: DC
  Filled 2020-12-07 (×2): qty 1

## 2020-12-07 NOTE — Progress Notes (Signed)
[facility] MD Progress Note  12/07/2020 4:25 PM Noah Ramsey  MRN:  [id number]  Reason for admission: Patient is a 25 year old male with history of schizoaffective disorder admitted for insomnia x 5 days, aggressive behavior, delusions, suicidal ideation and auditory hallucinations.  Objective: Medical record reviewed.  Patient's case discussed in detail with members of the treatment team.  I met with and evaluated the patient on the unit today for follow-up.  Patient's speech is more easily understood today with better articulation.  Hygiene is poor and patient is malodorous.  He continues to display intermittent intense stares and speak of being the [place].  He is focused on obtaining L-arginine supplements to increase his muscle strength.  Patient makes multiple loosely related delusional statements with hypersexual, hyperreligious, paranoid and grandiose themes.  He reports that he is eating and sleeping adequately.  I tried to assess his upper extremities for rigidity and cogwheeling but patient was unable to cooperate sufficiently with the exam for an adequate assessment to be obtained.  Patient was pleasant but at times assisted and other times resisted my movement of his upper extremities during the exam.  Staff report patient continues to be paranoid, delusional, intrusive and intermittently pace the halls requiring redirection.  Staff have observed patient talking to himself in response to internal stimuli.  Last night patient attempted to intimidate staff by posturing and displaying balled up fist and demanding L-arginine supplements and discharge.  He took as needed diazepam 5 mg PO x 1 last night and later required IM Geodon last night.  He has not required any PRN medications thus far today.  Principal Problem: Schizoaffective disorder ([place]) Diagnosis: Principal Problem:   Schizoaffective disorder ([place])  Total Time spent with patient:  25 minutes  Past Psychiatric History: See admission  H&P  Past Medical History:  Past Medical History:  Diagnosis Date   Elevated CPK    per patient   Schizophrenia ([place])    History reviewed. No pertinent surgical history. Family History:  Family History  Problem Relation Age of Onset   Mental illness Brother    Family Psychiatric  History: See admission H&P Social History:  Social History   Substance and Sexual Activity  Alcohol Use Never     Social History   Substance and Sexual Activity  Drug Use Never    Social History   Socioeconomic History   Marital status: Single    Spouse name: Not on file   Number of children: Not on file   Years of education: Not on file   Highest education level: Not on file  Occupational History   Not on file  Tobacco Use   Smoking status: Never   Smokeless tobacco: Never  Substance and Sexual Activity   Alcohol use: Never   Drug use: Never   Sexual activity: Not on file  Other Topics Concern   Not on file  Social History Narrative   Not on file   Social Determinants of Health   Financial Resource Strain: Not on file  Food Insecurity: Not on file  Transportation Needs: Not on file  Physical Activity: Not on file  Stress: Not on file  Social Connections: Not on file   Additional Social History:                         Sleep: Good  Appetite:  Good  Current Medications: Current Facility-Administered Medications  Medication Dose Route Frequency Provider Last Rate Last Admin  acetaminophen (TYLENOL) tablet 650 mg  650 mg Oral Q6H PRN Rankin, Shuvon B, NP   650 mg at 12/06/20 0611   alum & mag hydroxide-simeth (MAALOX/MYLANTA) 200-200-20 MG/5ML suspension 30 mL  30 mL Oral Q4H PRN Rankin, Shuvon B, NP       benztropine (COGENTIN) tablet 0.5 mg  0.5 mg Oral BID PRN Sharma Covert, MD   0.5 mg at 12/05/20 2240   benztropine (COGENTIN) tablet 0.5 mg  0.5 mg Oral BID Arthor Captain, MD   0.5 mg at 12/07/20 0747   diphenhydrAMINE (BENADRYL) injection 50 mg  50 mg  Intramuscular Q6H PRN Arthor Captain, MD       feeding supplement (ENSURE ENLIVE / ENSURE PLUS) liquid 237 mL  237 mL Oral BID BM Arthor Captain, MD       fluPHENAZine (PROLIXIN) tablet 10 mg  10 mg Oral Daily Arthor Captain, MD   10 mg at 12/07/20 1004   fluPHENAZine (PROLIXIN) tablet 5 mg  5 mg Oral QPM Arthor Captain, MD       LORazepam (ATIVAN) injection 2 mg  2 mg Intramuscular BID PRN Arthor Captain, MD       LORazepam (ATIVAN) tablet 1 mg  1 mg Oral BID PRN Arthor Captain, MD       magnesium hydroxide (MILK OF MAGNESIA) suspension 30 mL  30 mL Oral Daily PRN Rankin, Shuvon B, NP       metFORMIN (GLUCOPHAGE) tablet 500 mg  500 mg Oral BID WC Rankin, Shuvon B, NP   500 mg at 12/07/20 0746   OLANZapine zydis (ZYPREXA) disintegrating tablet 5 mg  5 mg Oral Q8H PRN Arthor Captain, MD       Oxcarbazepine (TRILEPTAL) tablet 300 mg  300 mg Oral BID Rankin, Shuvon B, NP   300 mg at 12/07/20 0746   propranolol (INDERAL) tablet 40 mg  40 mg Oral TID Rankin, Shuvon B, NP   40 mg at 12/07/20 1242   traZODone (DESYREL) tablet 100 mg  100 mg Oral Once Prescilla Sours, PA-C        Lab Results:  Results for orders placed or performed during the hospital encounter of 12/03/20 (from the past 48 hour(s))  TSH     Status: Abnormal   Collection Time: 12/06/20  6:53 AM  Result Value Ref Range   TSH 5.767 (H) 0.350 - 4.500 uIU/mL    Comment: Performed by a 3rd Generation assay with a functional sensitivity of <=0.01 uIU/mL. Performed at [facility], [place] [address] [place], [place] [address]     Blood Alcohol level:  Lab Results  Component Value Date   ETH <10 11/29/2020   ETH <10 46/65/9935    Metabolic Disorder Labs: Lab Results  Component Value Date   HGBA1C 5.5 12/04/2020   MPG 111.15 12/04/2020   No results found for: PROLACTIN Lab Results  Component Value Date   CHOL 123 12/02/2020   TRIG 74 12/02/2020   HDL 35 (L) 12/02/2020   CHOLHDL 3.5 12/02/2020   VLDL  15 12/02/2020   LDLCALC 73 12/02/2020   LDLCALC 101 (H) 06/03/2019    Physical Findings: AIMS: Facial and Oral Movements Muscles of Facial Expression: None, normal Lips and Perioral Area: None, normal Jaw: None, normal Tongue: None, normal,Extremity Movements Upper (arms, wrists, hands, fingers): None, normal Lower (legs, knees, ankles, toes): None, normal, Trunk Movements Neck, shoulders, hips: None, normal, Overall Severity Severity of abnormal  movements (highest score from questions above): None, normal Incapacitation due to abnormal movements: None, normal Patient's awareness of abnormal movements (rate only patient's report): No Awareness, Dental Status Current problems with teeth and/or dentures?: No Does patient usually wear dentures?: No  CIWA:    COWS:     Musculoskeletal: Strength & Muscle Tone: within normal limits Gait & Station: normal Patient leans: N/A  Psychiatric Specialty Exam:  Presentation  General Appearance: Disheveled; Other (comment) (Poor hygiene)  Eye Contact:Good; Other (comment) (Intermittent intense stares)  Speech:Normal Rate (Improved articulation)  Speech Volume:Normal  Handedness:Right   Mood and Affect  Mood:Anxious  Affect:Flat   Thought Process  Thought Processes:Coherent; Disorganized  Descriptions of Associations:Loose  Orientation:Full (Time, Place and Person)  Thought Content:Delusions; Paranoid Ideation; Illogical (Grandiose, hypersexual, religious and paranoid themes)  History of Schizophrenia/Schizoaffective disorder:Yes  Duration of Psychotic Symptoms:Greater than six months  Hallucinations:Hallucinations: Auditory  Ideas of Reference:Delusions; Paranoia  Suicidal Thoughts:Suicidal Thoughts: No  Homicidal Thoughts:Homicidal Thoughts: No   Sensorium  Memory:Immediate Fair; Recent Fair  Judgment:Impaired  Insight:Shallow   Executive Functions  Concentration:Fair  Attention  Span:Fair  [place]   Psychomotor Activity  Psychomotor Activity:Psychomotor Activity: Normal   Assets  Assets:Communication Skills; Housing; Social Support   Sleep  Sleep:Sleep: Fair Number of Hours of Sleep: 5    Physical Exam: Physical Exam Vitals and nursing note reviewed.  Constitutional:      General: He is not in acute distress.    Appearance: He is not diaphoretic.  HENT:     Head: Normocephalic and atraumatic.  Cardiovascular:     Rate and Rhythm: Normal rate.  Pulmonary:     Effort: Pulmonary effort is normal.  Neurological:     General: No focal deficit present.     Mental Status: He is alert and oriented to person, place, and time.     Comments: Attempted to assess upper extremities for cogwheeling and rigidity.  Patient had difficulty cooperating with exam and alternated between resisting and assisting my movement of his upper extremities.  Unable to perform accurate assessment/exam as a result.   Review of Systems  Constitutional:  Negative for chills, diaphoresis and fever.  HENT:  Negative for sore throat.   Respiratory:  Negative for cough and shortness of breath.   Cardiovascular:  Negative for chest pain and palpitations.  Gastrointestinal:  Negative for constipation, diarrhea, nausea and vomiting.  Musculoskeletal:  Negative for myalgias.  Neurological:  Negative for dizziness, seizures and headaches.  Psychiatric/Behavioral:  Positive for hallucinations. Negative for depression, substance abuse and suicidal ideas. The patient is nervous/anxious. The patient does not have insomnia.   Blood pressure 113/69, pulse 98, temperature (!) 97.5 F (36.4 C), temperature source Oral, resp. rate 16, height _0  (1.676 m), weight 134.3 kg, SpO2 98 %. Body mass index is 47.78 kg/m.   Treatment Plan Summary: Patient is a 25 year old male with schizoaffective disorder, bipolar type admitted for treatment and  stabilization of worsening psychotic and mood symptoms, suicidal ideation and aggressive behavior.  Patient continues to display disorganized thought processes, delusional thought content, intrusiveness, and intermittent agitation.  Daily contact with patient to assess and evaluate symptoms and progress in treatment and Medication management  Schizoaffective disorder -Discontinue Risperdal 2 mg daily and 3 mg nightly for psychotic symptoms and mood stabilization given lack of improvement -Start Prolixin 10 mg PO each morning and 5 mg PO each evening for psychotic symptoms -Goal is for eventual transition to long-acting injectable  antipsychotic -Continue Trileptal 300 mg twice daily for mood stabilization  Anxiety/EPS -Continue propranolol 40 mg 3 times daily for restlessness and anxiety -Discontinue diazepam 5 mg BID PRN anxiety -Start lorazepam 1 mg PO BID PRN anxiety, restlessness -Continue benztropine 0.5 mg twice daily as needed tremors -Continue benztropine 0.5 mg twice daily for muscle stiffness -Continue diphenhydramine 50 mg IM every 6 hours as needed acute dystonic reaction  Diabetes Mellitus -Continue metformin 500 mg twice daily with meals  Agitation  -Continue olanzapine 5 mg Q8H PRN agitation -Start lorazepam 2 mg IM BID PRN for severe agitation only if patient unable to take oral PRN medications for agitation  Discharge planning in progress  Arthor Captain, MD 12/07/2020, 4:25 PM

## 2020-12-07 NOTE — Progress Notes (Signed)
Pt did not attend psycho-ed group. 

## 2020-12-07 NOTE — Progress Notes (Signed)
Pt continues to pace and talk to people not seen by staff

## 2020-12-07 NOTE — Progress Notes (Signed)
Pt did not attend orientation/goals group. 

## 2020-12-07 NOTE — Progress Notes (Signed)
Recreation Therapy Notes  Date: 8.18.22 Time: 1000 Location: 500 Hall Dayroom   Group Topic: Coping Skills   Goal Area(s) Addresses: Patient will define what a coping skill is. Patient will work with peer to create a list of healthy coping skills beginning with each letter of the alphabet. Patient will successfully identify positive coping skills they can use post d/c.  Patient will acknowledge benefit(s) of using learned coping skills post d/c.   Intervention: Group work   Activity: Coping A to Z. Patient asked to identify what a coping skill is and when they use them. Patients with Clinical research associate discussed healthy versus unhealthy coping skills. Next patients were given a blank worksheet titled "Coping Skills A-Z" and asked to pair up with a peer. Partners were instructed to come up with at least one positive coping skill per letter of the alphabet, addressing a specific challenge (ex: stress, anger, anxiety, depression, grief, doubt, isolation, self-harm/suicidal thoughts, substance use). Patients were given 15 minutes to brainstorm with their peer, before ideas were presented to the large group. Patients and LRT debriefed on the importance of coping skill selection based on situation and back-up plans when a skill tried is not effective. At the end of group, patients were given an handout of alphabetized strategies to keep for future reference.   Education: Pharmacologist, Scientist, physiological, Discharge Planning.    Education Outcome: Acknowledges education/Verbalizes understanding/In group clarification offered/Additional education needed   Clinical Observations/Feedback: Pt did not attend group session.      Caroll Rancher, LRT/CTRS         Caroll Rancher A 12/07/2020 11:35 AM

## 2020-12-07 NOTE — Progress Notes (Signed)
Pt visible on the unit much of the evening. Pt requested something to help him sleep. There was nothing ordered so NP-Cody ordered 1 x 100 mg Trazodone and was given per MAR . Pt continues with bizarre statements and behavior at times.     12/07/20 2000  Psych Admission Type (Psych Patients Only)  Admission Status Involuntary  Psychosocial Assessment  Patient Complaints Anxiety;Sleep disturbance  Eye Contact Fair  Facial Expression Anxious;Blank;Pensive  Affect Anxious;Preoccupied  Speech Slurred;Echolalia  Interaction Assertive;Intrusive  Motor Activity Pacing;Slow  Appearance/Hygiene Disheveled;Poor hygiene;In scrubs  Behavior Characteristics Cooperative;Anxious  Mood Suspicious;Preoccupied  Thought Process  Coherency Blocking;Disorganized;Tangential  Content Preoccupation  Delusions [place];Paranoid  Perception Hallucinations  Hallucination Auditory  Judgment Poor  Confusion None  Danger to Self  Current suicidal ideation? Denies  Danger to Others  Danger to Others None reported or observed

## 2020-12-07 NOTE — Progress Notes (Signed)
Pt up to the nursing station stated he wanted LAI [place]. Pt was informed that he could talk to the doctor about what LAI that may be right for him .

## 2020-12-07 NOTE — Plan of Care (Signed)
Progress note  Pt found in the hallway pacing. Pt was compliant with morning medications. Pt continues to be delusional with magical thinking and displays loose associations. Pt continues to focus on L-Arganine for their "blood composition". Pt can be still be sexually inappropriate with staff. Pt was encouraged to provide self-care and shower. Pt is pleasant. Pt still endorsing voices. Pt denies si/hi/vh and verbally agrees to approach staff if these become apparent or before harming themselves/others while at [facility].  A: Pt provided support and encouragement. Pt given medication per protocol and standing orders. Q50m safety checks implemented and continued.  R: Pt safe on the unit. Will continue to monitor.   Problem: Education: Goal: Ability to make informed decisions regarding treatment will improve Outcome: Progressing   Problem: Coping: Goal: Coping ability will improve Outcome: Progressing   Problem: Education: Goal: Emotional status will improve Outcome: Progressing

## 2020-12-07 NOTE — Progress Notes (Signed)
Pt up in his room, pt responding to internal stimuli. Pt up to the nursing station asking questions with no relevance to his situation.

## 2020-12-08 DIAGNOSIS — F250 Schizoaffective disorder, bipolar type: Secondary | ICD-10-CM | POA: Diagnosis not present

## 2020-12-08 MED ORDER — TRAZODONE HCL 100 MG PO TABS
100.0000 mg | ORAL_TABLET | Freq: Every evening | ORAL | Status: DC | PRN
  Administered 2020-12-08 – 2020-12-11 (×3): 100 mg via ORAL
  Filled 2020-12-08 (×3): qty 1

## 2020-12-08 MED ORDER — TRAZODONE HCL 100 MG PO TABS
100.0000 mg | ORAL_TABLET | Freq: Once | ORAL | Status: AC
  Administered 2020-12-08: 100 mg via ORAL
  Filled 2020-12-08 (×2): qty 1

## 2020-12-08 MED ORDER — PROPRANOLOL HCL 40 MG PO TABS
40.0000 mg | ORAL_TABLET | Freq: Three times a day (TID) | ORAL | Status: DC
  Administered 2020-12-08 – 2021-01-03 (×68): 40 mg via ORAL
  Filled 2020-12-08: qty 1
  Filled 2020-12-08: qty 4
  Filled 2020-12-08 (×11): qty 1
  Filled 2020-12-08: qty 4
  Filled 2020-12-08 (×17): qty 1
  Filled 2020-12-08: qty 4
  Filled 2020-12-08 (×4): qty 1
  Filled 2020-12-08: qty 4
  Filled 2020-12-08 (×12): qty 1
  Filled 2020-12-08: qty 4
  Filled 2020-12-08 (×32): qty 1
  Filled 2020-12-08: qty 4
  Filled 2020-12-08 (×6): qty 1
  Filled 2020-12-08: qty 4
  Filled 2020-12-08 (×4): qty 1

## 2020-12-08 MED ORDER — FLUPHENAZINE HCL 10 MG PO TABS
10.0000 mg | ORAL_TABLET | Freq: Every evening | ORAL | Status: DC
  Administered 2020-12-08 – 2020-12-11 (×4): 10 mg via ORAL
  Filled 2020-12-08 (×6): qty 1

## 2020-12-08 MED ORDER — BENZTROPINE MESYLATE 0.5 MG PO TABS: 0.5000 mg | ORAL_TABLET | Freq: Four times a day (QID) | ORAL | Status: DC | PRN

## 2020-12-08 MED ORDER — PROPRANOLOL HCL 40 MG PO TABS
40.0000 mg | ORAL_TABLET | ORAL | Status: AC
  Administered 2020-12-08: 40 mg via ORAL
  Filled 2020-12-08: qty 1

## 2020-12-08 MED ORDER — LORAZEPAM 1 MG PO TABS
1.0000 mg | ORAL_TABLET | Freq: Four times a day (QID) | ORAL | Status: DC | PRN
  Administered 2020-12-08 – 2020-12-11 (×2): 1 mg via ORAL
  Filled 2020-12-08 (×2): qty 1

## 2020-12-08 NOTE — Progress Notes (Signed)
Pt continues to pace in and out his room, pt continues to come up to the nursing station asking for things. NP-Cody ordered another 1 x 100 mg Trazodone

## 2020-12-08 NOTE — Progress Notes (Signed)
Pt did not attend relaxation group.  

## 2020-12-08 NOTE — Progress Notes (Signed)
[facility] MD Progress Note  12/08/2020 3:12 PM Noah Ramsey  MRN:  [id number]  Reason for admission: Patient is a 25 year old male with history of schizoaffective disorder admitted for insomnia x 5 days, aggressive behavior, delusions, suicidal ideation and auditory hallucinations.  Objective: Medical record reviewed.  Patient's case discussed in detail with members of the treatment team.  I met with and evaluated the patient on the unit today for follow-up with nursing staff present.  Patient shows some improvement today.  I contact is more appropriate and he is not engaging in prolonged staring as frequently.  Articulation is improved and speech is easier to understand.  Thought processes are more organized and less tangential.  Patient makes more appropriate reality-based statements and fewer paranoid or grandiose statements during our interaction today.  He is less hypersexual.  Patient denies SI, AI, PI, AH or VH.  He denies tongue thickness, muscle spasms, trouble breathing, trouble swallowing, tremor or other medication side effects.  He denies any physical problems.  He reports that he is eating and sleeping well.  Staff document that patient slept only 3.25 hours last night.  No new labs today.  Vital signs are stable and within normal limits.  Patient has been taking scheduled medications as prescribed.  He received lorazepam 1 mg at 1140PM last night for restlessness.  He took trazodone 100 mg at bedtime with a repeat dose of 100 mg.  Staff document that patient continues to make delusional statements, pace and periodically talk to himself in apparent response to internal stimuli.    Principal Problem: Schizoaffective disorder ([place]) Diagnosis: Principal Problem:   Schizoaffective disorder ([place])  Total Time spent with patient:  25 minutes  Past Psychiatric History: See admission H&P  Past Medical History:  Past Medical History:  Diagnosis Date   Elevated CPK    per patient    Schizophrenia ([place])    History reviewed. No pertinent surgical history. Family History:  Family History  Problem Relation Age of Onset   Mental illness Brother    Family Psychiatric  History: See admission H&P Social History:  Social History   Substance and Sexual Activity  Alcohol Use Never     Social History   Substance and Sexual Activity  Drug Use Never    Social History   Socioeconomic History   Marital status: Single    Spouse name: Not on file   Number of children: Not on file   Years of education: Not on file   Highest education level: Not on file  Occupational History   Not on file  Tobacco Use   Smoking status: Never   Smokeless tobacco: Never  Substance and Sexual Activity   Alcohol use: Never   Drug use: Never   Sexual activity: Not on file  Other Topics Concern   Not on file  Social History Narrative   Not on file   Social Determinants of Health   Financial Resource Strain: Not on file  Food Insecurity: Not on file  Transportation Needs: Not on file  Physical Activity: Not on file  Stress: Not on file  Social Connections: Not on file   Additional Social History:                         Sleep: Poor  Appetite:  Good  Current Medications: Current Facility-Administered Medications  Medication Dose Route Frequency Provider Last Rate Last Admin   acetaminophen (TYLENOL) tablet 650 mg  650 mg Oral  Q6H PRN Rankin, Shuvon B, NP   650 mg at 12/08/20 0359   alum & mag hydroxide-simeth (MAALOX/MYLANTA) 200-200-20 MG/5ML suspension 30 mL  30 mL Oral Q4H PRN Rankin, Shuvon B, NP       benztropine (COGENTIN) tablet 0.5 mg  0.5 mg Oral BID Arthor Captain, MD   0.5 mg at 12/08/20 0819   benztropine (COGENTIN) tablet 0.5 mg  0.5 mg Oral Q6H PRN Arthor Captain, MD       diphenhydrAMINE (BENADRYL) injection 50 mg  50 mg Intramuscular Q6H PRN Arthor Captain, MD       feeding supplement (ENSURE ENLIVE / ENSURE PLUS) liquid 237 mL  237 mL Oral BID  BM Arthor Captain, MD   237 mL at 12/08/20 1428   fluPHENAZine (PROLIXIN) tablet 10 mg  10 mg Oral Daily Arthor Captain, MD   10 mg at 12/08/20 0818   fluPHENAZine (PROLIXIN) tablet 10 mg  10 mg Oral QPM Arthor Captain, MD       LORazepam (ATIVAN) injection 2 mg  2 mg Intramuscular BID PRN Arthor Captain, MD       LORazepam (ATIVAN) tablet 1 mg  1 mg Oral Q6H PRN Arthor Captain, MD       magnesium hydroxide (MILK OF MAGNESIA) suspension 30 mL  30 mL Oral Daily PRN Rankin, Shuvon B, NP       metFORMIN (GLUCOPHAGE) tablet 500 mg  500 mg Oral BID WC Rankin, Shuvon B, NP   500 mg at 12/08/20 0818   OLANZapine zydis (ZYPREXA) disintegrating tablet 5 mg  5 mg Oral Q8H PRN Arthor Captain, MD       Oxcarbazepine (TRILEPTAL) tablet 300 mg  300 mg Oral BID Rankin, Shuvon B, NP   300 mg at 12/08/20 0818   propranolol (INDERAL) tablet 40 mg  40 mg Oral Q8H Arthor Captain, MD   40 mg at 12/08/20 1427   traZODone (DESYREL) tablet 100 mg  100 mg Oral QHS PRN Arthor Captain, MD        Lab Results:  No results found for this or any previous visit (from the past 48 hour(s)).   Blood Alcohol level:  Lab Results  Component Value Date   ETH <10 11/29/2020   ETH <10 09/81/1914    Metabolic Disorder Labs: Lab Results  Component Value Date   HGBA1C 5.5 12/04/2020   MPG 111.15 12/04/2020   No results found for: PROLACTIN Lab Results  Component Value Date   CHOL 123 12/02/2020   TRIG 74 12/02/2020   HDL 35 (L) 12/02/2020   CHOLHDL 3.5 12/02/2020   VLDL 15 12/02/2020   LDLCALC 73 12/02/2020   LDLCALC 101 (H) 06/03/2019    Physical Findings: AIMS: Facial and Oral Movements Muscles of Facial Expression: None, normal Lips and Perioral Area: None, normal Jaw: None, normal Tongue: None, normal,Extremity Movements Upper (arms, wrists, hands, fingers): None, normal Lower (legs, knees, ankles, toes): None, normal, Trunk Movements Neck, shoulders, hips: None, normal, Overall Severity Severity  of abnormal movements (highest score from questions above): None, normal Incapacitation due to abnormal movements: None, normal Patient's awareness of abnormal movements (rate only patient's report): No Awareness, Dental Status Current problems with teeth and/or dentures?: No Does patient usually wear dentures?: No  CIWA:    COWS:     Musculoskeletal: Strength & Muscle Tone: within normal limits Gait & Station: normal Patient leans: N/A  Psychiatric Specialty Exam:  Presentation  General Appearance: Disheveled  Eye Contact:Good  Speech:Clear and Coherent  Speech Volume:Normal  Handedness:Right   Mood and Affect  Mood:Anxious  Affect:Flat   Thought Process  Thought Processes:Coherent  Descriptions of Associations:Tangential  Orientation:Full (Time, Place and Person)  Thought Content:Delusions; Paranoid Ideation  History of Schizophrenia/Schizoaffective disorder:Yes  Duration of Psychotic Symptoms:Greater than six months  Hallucinations:Hallucinations: Auditory  Ideas of Reference:Delusions; Paranoia  Suicidal Thoughts:Suicidal Thoughts: No  Homicidal Thoughts:Homicidal Thoughts: No   Sensorium  Memory:Immediate Fair; Recent Fair  Judgment:Impaired  Insight:Shallow   Executive Functions  Concentration:Fair  Attention Span:Fair  [place]  Language:Good   Psychomotor Activity  Psychomotor Activity:Psychomotor Activity: Normal   Assets  Assets:Communication Skills; Housing; Social Support   Sleep  Sleep:Sleep: Good Number of Hours of Sleep: 3.25    Physical Exam: Physical Exam Vitals and nursing note reviewed.  Constitutional:      General: He is not in acute distress.    Appearance: He is not diaphoretic.  HENT:     Head: Normocephalic and atraumatic.  Cardiovascular:     Rate and Rhythm: Normal rate.  Pulmonary:     Effort: Pulmonary effort is normal.  Neurological:     General: No focal  deficit present.     Mental Status: He is alert and oriented to person, place, and time.   Review of Systems  Constitutional:  Negative for chills, diaphoresis and fever.  HENT:  Negative for sore throat.   Respiratory:  Negative for cough and shortness of breath.   Cardiovascular:  Negative for chest pain and palpitations.  Gastrointestinal:  Negative for constipation, diarrhea, nausea and vomiting.  Musculoskeletal:  Negative for myalgias.  Neurological:  Negative for dizziness, seizures and headaches.  Psychiatric/Behavioral:  Positive for hallucinations. Negative for depression, substance abuse and suicidal ideas. The patient is nervous/anxious. The patient does not have insomnia.   Blood pressure 121/70, pulse 97, temperature (!) 97.5 F (36.4 C), temperature source Oral, resp. rate 16, height 5' 6"  (1.676 m), weight 134.3 kg, SpO2 98 %. Body mass index is 47.78 kg/m.   Treatment Plan Summary: Patient is a 25 year old male with schizoaffective disorder, bipolar type admitted for treatment and stabilization of worsening psychotic and mood symptoms, suicidal ideation and aggressive behavior.  Patient is displaying some improvement in organization of thought processes and is less exclusively focused on delusional themes.  Overall he continues to display ongoing symptoms of disorganized thought processes, delusional thought content, intrusiveness, and intermittent agitation.  Daily contact with patient to assess and evaluate symptoms and progress in treatment and Medication management  Schizoaffective disorder -Increase Prolixin to 10 mg PO each morning and 10 mg PO each evening for psychotic symptoms -Goal is for eventual transition to long-acting injectable antipsychotic (Prolixin Decanoate) -Continue Trileptal 300 mg twice daily for mood stabilization  Anxiety/EPS -Change propranolol to 40 mg Q8H for restlessness and anxiety -Change lorazepam to 1 mg PO Q6H PRN anxiety,  restlessness -Continue benztropine 0.5 mg PO twice daily  -Continue benztropine 0.5 mg PO Q6H PRN for tremors, muscle stiffness -Continue diphenhydramine 50 mg IM every 6 hours as needed acute dystonic reaction  Diabetes Mellitus -Continue metformin 500 mg twice daily with meals  Agitation  -Continue olanzapine 5 mg Q8H PRN agitation -Start lorazepam 2 mg IM BID PRN for severe agitation only if patient unable to take oral PRN medications for agitation  Discharge planning in progress  Arthor Captain, MD 12/08/2020, 3:12 PM

## 2020-12-08 NOTE — [facility] Group Notes (Signed)
[facility] LCSW Group Therapy    Due to the acuity and complex discharge plans, group was not held. Patient was provided therapeutic worksheets and asked to meet with CSW as needed.     Dezzie Badilla MSW, LCSW Clincal Social Worker  [facility]  

## 2020-12-08 NOTE — Plan of Care (Addendum)
Progress note  Pt found in bed; compliant with medication administration. Pt denies any physical complaints or pain. Pt continued to echo the MD/RN assessment questions this morning but is pleasant. Pt is intrusive but easily redirectable. Pt seems less disorganized and having magical thinking. Pt's questions regarding medication changes seem valid and substantiated. Pt didn't voice concerns with this Clinical research associate. Pt is pleasant. Pt does seem to still be responding at times and talking to self but denies avh. Pt denies si/hi/ah/vh and verbally agrees to approach staff if these become apparent or before harming themselves/others while at [facility].  A: Pt provided support and encouragement. Pt given medication per protocol and standing orders. Q85m safety checks implemented and continued.  R: Pt safe on the unit. Will continue to monitor.  Problem: Education: Goal: Mental status will improve Outcome: Progressing Goal: Verbalization of understanding the information provided will improve Outcome: Progressing   Problem: Activity: Goal: Interest or engagement in activities will improve Outcome: Progressing

## 2020-12-08 NOTE — Progress Notes (Signed)
Recreation Therapy Notes  Date: 8.19.22 Time: 1000 Location: 500 Hall Dayroom  Group Topic: Communication, Team Building, Problem Solving  Goal Area(s) Addresses:  Patient will effectively work with peer towards shared goal.  Patient will identify skills used to make activity successful.  Patient will share challenges and verbalize solution-driven approaches used. Patient will identify how skills used during activity can be used to reach post d/c goals.   Behavioral Response: None  Intervention: STEM Activity   Activity: [employer]. Patients were provided the following materials: 5 drinking straws, 5 rubber bands, 5 paper clips, 2 index cards and 2 drinking cups. Using the provided materials patients were asked to build a launching mechanism to launch a ping pong ball across the room, approximately 10 feet. Patients were divided into teams of 3-5. Instructions required all materials be incorporated into the device, functionality of items left to the peer group's discretion.  Education: Pharmacist, community, Scientist, physiological, Air cabin crew, Building control surveyor.   Education Outcome: Acknowledges education/In group clarification offered/Needs additional education.   Clinical Observations/Feedback: Pt spent time talking to himself.  Pt didn't attempt to help his partner complete the activity.  Pt left and came back before leaving to good and not returning.      Caroll Rancher, LRT/CTRS         Caroll Rancher A 12/08/2020 12:53 PM

## 2020-12-08 NOTE — Tx Team (Signed)
Interdisciplinary Treatment and Diagnostic Plan Update  12/08/2020 Time of Session: 9:55am Noah Ramsey MRN: [id number]  Principal Diagnosis: Schizoaffective disorder [facility])  Secondary Diagnoses: Principal Problem:   Schizoaffective disorder (HCC)   Current Medications:  Current Facility-Administered Medications  Medication Dose Route Frequency Provider Last Rate Last Admin   acetaminophen (TYLENOL) tablet 650 mg  650 mg Oral Q6H PRN Rankin, Shuvon B, NP   650 mg at 12/08/20 0359   alum & mag hydroxide-simeth (MAALOX/MYLANTA) 200-200-20 MG/5ML suspension 30 mL  30 mL Oral Q4H PRN Rankin, Shuvon B, NP       benztropine (COGENTIN) tablet 0.5 mg  0.5 mg Oral BID Claudie Revering, MD   0.5 mg at 12/08/20 0819   benztropine (COGENTIN) tablet 0.5 mg  0.5 mg Oral Q6H PRN Claudie Revering, MD       diphenhydrAMINE (BENADRYL) injection 50 mg  50 mg Intramuscular Q6H PRN Claudie Revering, MD       feeding supplement (ENSURE ENLIVE / ENSURE PLUS) liquid 237 mL  237 mL Oral BID BM Claudie Revering, MD   237 mL at 12/08/20 1057   fluPHENAZine (PROLIXIN) tablet 10 mg  10 mg Oral Daily Claudie Revering, MD   10 mg at 12/08/20 0818   fluPHENAZine (PROLIXIN) tablet 10 mg  10 mg Oral QPM Claudie Revering, MD       LORazepam (ATIVAN) injection 2 mg  2 mg Intramuscular BID PRN Claudie Revering, MD       LORazepam (ATIVAN) tablet 1 mg  1 mg Oral Q6H PRN Claudie Revering, MD       magnesium hydroxide (MILK OF MAGNESIA) suspension 30 mL  30 mL Oral Daily PRN Rankin, Shuvon B, NP       metFORMIN (GLUCOPHAGE) tablet 500 mg  500 mg Oral BID WC Rankin, Shuvon B, NP   500 mg at 12/08/20 0818   OLANZapine zydis (ZYPREXA) disintegrating tablet 5 mg  5 mg Oral Q8H PRN Claudie Revering, MD       Oxcarbazepine (TRILEPTAL) tablet 300 mg  300 mg Oral BID Rankin, Shuvon B, NP   300 mg at 12/08/20 0818   propranolol (INDERAL) tablet 40 mg  40 mg Oral Q8H Claudie Revering, MD       traZODone (DESYREL) tablet 100 mg  100 mg Oral  QHS PRN Claudie Revering, MD       PTA Medications: Medications Prior to Admission  Medication Sig Dispense Refill Last Dose   diazepam (VALIUM) 5 MG tablet Take 5 mg by mouth 2 (two) times daily as needed for anxiety.      haloperidol (HALDOL) 10 MG tablet Take 10 mg by mouth at bedtime. (Patient not taking: No sig reported)      haloperidol decanoate (HALDOL DECANOATE) 100 MG/ML injection Inject 100 mg into the muscle every 28 (twenty-eight) days. (Patient not taking: No sig reported)      LATUDA 40 MG TABS tablet Take 40 mg by mouth at bedtime.      metFORMIN (GLUCOPHAGE) 500 MG tablet Take 1 tablet (500 mg total) by mouth 2 (two) times daily with a meal. (Patient not taking: No sig reported) 180 tablet 3    OVER THE COUNTER MEDICATION Ripped Fat Burner (diet pill) (Patient not taking: Reported on 12/06/2020)   Not Taking   OVER THE COUNTER MEDICATION Yohimbine (diet pill) (Patient not taking: Reported on 12/06/2020)   Not Taking   propranolol (INDERAL) 40 MG tablet  Take 40 mg by mouth 3 (three) times daily.       Patient Stressors: Financial difficulties Medication change or noncompliance  Patient Strengths: Manufacturing systems engineer Supportive family/friends  Treatment Modalities: Medication Management, Group therapy, Case management,  1 to 1 session with clinician, Psychoeducation, Recreational therapy.   Physician Treatment Plan for Primary Diagnosis: Schizoaffective disorder (HCC) Long Term Goal(s): Improvement in symptoms so as ready for discharge   Short Term Goals: Ability to identify changes in lifestyle to reduce recurrence of condition will improve Ability to verbalize feelings will improve Ability to disclose and discuss suicidal ideas Ability to demonstrate self-control will improve Ability to identify and develop effective coping behaviors will improve Ability to maintain clinical measurements within normal limits will improve Compliance with prescribed medications will  improve  Medication Management: Evaluate patient's response, side effects, and tolerance of medication regimen.  Therapeutic Interventions: 1 to 1 sessions, Unit Group sessions and Medication administration.  Evaluation of Outcomes: Progressing  Physician Treatment Plan for Secondary Diagnosis: Principal Problem:   Schizoaffective disorder (HCC)  Long Term Goal(s): Improvement in symptoms so as ready for discharge   Short Term Goals: Ability to identify changes in lifestyle to reduce recurrence of condition will improve Ability to verbalize feelings will improve Ability to disclose and discuss suicidal ideas Ability to demonstrate self-control will improve Ability to identify and develop effective coping behaviors will improve Ability to maintain clinical measurements within normal limits will improve Compliance with prescribed medications will improve     Medication Management: Evaluate patient's response, side effects, and tolerance of medication regimen.  Therapeutic Interventions: 1 to 1 sessions, Unit Group sessions and Medication administration.  Evaluation of Outcomes: Progressing   RN Treatment Plan for Primary Diagnosis: Schizoaffective disorder (HCC) Long Term Goal(s): Knowledge of disease and therapeutic regimen to maintain health will improve  Short Term Goals: Ability to remain free from injury will improve, Ability to verbalize frustration and anger appropriately will improve, Ability to demonstrate self-control, Ability to identify and develop effective coping behaviors will improve, and Compliance with prescribed medications will improve  Medication Management: RN will administer medications as ordered by provider, will assess and evaluate patient's response and provide education to patient for prescribed medication. RN will report any adverse and/or side effects to prescribing provider.  Therapeutic Interventions: 1 on 1 counseling sessions, Psychoeducation,  Medication administration, Evaluate responses to treatment, Monitor vital signs and CBGs as ordered, Perform/monitor CIWA, COWS, AIMS and Fall Risk screenings as ordered, Perform wound care treatments as ordered.  Evaluation of Outcomes: Progressing   LCSW Treatment Plan for Primary Diagnosis: Schizoaffective disorder (HCC) Long Term Goal(s): Safe transition to appropriate next level of care at discharge, Engage patient in therapeutic group addressing interpersonal concerns.  Short Term Goals: Engage patient in aftercare planning with referrals and resources, Increase social support, Increase ability to appropriately verbalize feelings, Increase emotional regulation, Identify triggers associated with mental health/substance abuse issues, and Increase skills for wellness and recovery  Therapeutic Interventions: Assess for all discharge needs, 1 to 1 time with Social worker, Explore available resources and support systems, Assess for adequacy in community support network, Educate family and significant other(s) on suicide prevention, Complete Psychosocial Assessment, Interpersonal group therapy.  Evaluation of Outcomes: Progressing   Progress in Treatment: Attending groups: Yes. Participating in groups: Yes. Taking medication as prescribed: Yes. Toleration medication: Yes. Family/Significant other contact made: Yes, individual(s) contacted:  mother Patient understands diagnosis: Yes. Discussing patient identified problems/goals with staff: Yes. Medical problems stabilized or resolved:  Yes. Denies suicidal/homicidal ideation: Yes. Issues/concerns per patient self-inventory: No.   New problem(s) identified: No, Describe:  none  New Short Term/Long Term Goal(s): medication stabilization, elimination of SI thoughts, development of comprehensive mental wellness plan.    Patient Goals:  Did not attend  Discharge Plan or Barriers: Pt will live with his mother at discharge. Pt will f/u at  [facility] for medication management and therapy. Pt has been referred for ACTT services.   Reason for Continuation of Hospitalization: Aggression Delusions  Hallucinations Medication stabilization  Estimated Length of Stay: 3-5 days  Attendees: Patient: Did not attend 12/04/2020   Physician: Arna Snipe, DO 12/04/2020   Nursing:  12/04/2020   RN Care Manager: 12/04/2020   Social Worker: Ruthann Cancer, LCSW 12/04/2020  Recreational Therapist:  12/04/2020   Other:  12/04/2020   Other:  12/04/2020   Other: 12/04/2020     Scribe for Treatment Team: Felizardo Hoffmann, LCSWA 12/08/2020 11:10 AM

## 2020-12-08 NOTE — Progress Notes (Signed)
Pt continues to come out the room pacing, not trying to lay down and go to sleep . Pt coming to nursing station trying to talk about nonessential things.

## 2020-12-08 NOTE — Progress Notes (Signed)
Pt coming out his room constantly. Pt coming to the nursing station with delusional speech"I'm not the antichrist and jesus was not a good person" pt encouraged to try to stay in his room and get some rest.

## 2020-12-08 NOTE — Progress Notes (Signed)
Pt did not attend orientation/goals group. 

## 2020-12-09 NOTE — Progress Notes (Signed)
Pt did not attend orientation/goals group. 

## 2020-12-09 NOTE — Plan of Care (Signed)
Calm and cooperative. Currently attending group.   

## 2020-12-09 NOTE — Progress Notes (Signed)
[facility] MD Progress Note  12/09/2020 6:05 PM Noah Ramsey  MRN:  [id number]  Reason for admission: Patient is a 25 year old male with history of schizoaffective disorder admitted for insomnia x 5 days, aggressive behavior, delusions, suicidal ideation and auditory hallucinations.  Objective: Medical record reviewed.  Patient's case discussed in detail with members of the treatment team.  I met with and evaluated the patient on the unit today for follow-up with nursing staff present.  Patient shows some improvement today.  Eye contact is more appropriate and he is not engaging in prolonged staring as frequently.  Articulation is improved and speech is easier to understand.  Thought processes are more organized and less tangential. He answers questions with one to two words but is appropriate with his answers.  Patient makes more appropriate reality-based statements and fewer paranoid or grandiose statements during our interaction today.  He is less hypersexual.  Patient denies SI, HI, AH or VH.  He denies tongue thickness, muscle spasms, trouble breathing, trouble swallowing, tremor or other medication side effects.  He denies any physical problems.  He reports that he is eating and sleeping well.  Staff document that patient slept only 2.25 hours last night.  No new labs today.  Vital signs are stable and within normal limits.  Patient has been taking scheduled medications as prescribed.  He received lorazepam 1 mg at 1029 PM last night for restlessness.  He took trazodone 100 mg and Inderal 40 mg at bedtime.  Prolixin was increased yesterday to 10 mg BID.  Staff document that patient continues to make delusional statements, pace and periodically talk to himself in apparent response to internal stimuli.  No medication changes today.   Principal Problem: Schizoaffective disorder ([place]) Diagnosis: Principal Problem:   Schizoaffective disorder ([place])  Total Time spent with patient:  25 minutes  Past  Psychiatric History: See admission H&P  Past Medical History:  Past Medical History:  Diagnosis Date   Elevated CPK    per patient   Schizophrenia ([place])    History reviewed. No pertinent surgical history. Family History:  Family History  Problem Relation Age of Onset   Mental illness Brother    Family Psychiatric  History: See admission H&P Social History:  Social History   Substance and Sexual Activity  Alcohol Use Never     Social History   Substance and Sexual Activity  Drug Use Never    Social History   Socioeconomic History   Marital status: Single    Spouse name: Not on file   Number of children: Not on file   Years of education: Not on file   Highest education level: Not on file  Occupational History   Not on file  Tobacco Use   Smoking status: Never   Smokeless tobacco: Never  Substance and Sexual Activity   Alcohol use: Never   Drug use: Never   Sexual activity: Not on file  Other Topics Concern   Not on file  Social History Narrative   Not on file   Social Determinants of Health   Financial Resource Strain: Not on file  Food Insecurity: Not on file  Transportation Needs: Not on file  Physical Activity: Not on file  Stress: Not on file  Social Connections: Not on file   Additional Social History:                         Sleep: Poor  Appetite:  Good  Current Medications:  Current Facility-Administered Medications  Medication Dose Route Frequency Provider Last Rate Last Admin   acetaminophen (TYLENOL) tablet 650 mg  650 mg Oral Q6H PRN Rankin, Shuvon B, NP   650 mg at 12/08/20 0359   alum & mag hydroxide-simeth (MAALOX/MYLANTA) 200-200-20 MG/5ML suspension 30 mL  30 mL Oral Q4H PRN Rankin, Shuvon B, NP       benztropine (COGENTIN) tablet 0.5 mg  0.5 mg Oral BID Arthor Captain, MD   0.5 mg at 12/09/20 9767   benztropine (COGENTIN) tablet 0.5 mg  0.5 mg Oral Q6H PRN Arthor Captain, MD       diphenhydrAMINE (BENADRYL) injection 50  mg  50 mg Intramuscular Q6H PRN Arthor Captain, MD       feeding supplement (ENSURE ENLIVE / ENSURE PLUS) liquid 237 mL  237 mL Oral BID BM Arthor Captain, MD   237 mL at 12/09/20 1436   fluPHENAZine (PROLIXIN) tablet 10 mg  10 mg Oral Daily Arthor Captain, MD   10 mg at 12/09/20 3419   fluPHENAZine (PROLIXIN) tablet 10 mg  10 mg Oral QPM Arthor Captain, MD   10 mg at 12/08/20 1809   LORazepam (ATIVAN) injection 2 mg  2 mg Intramuscular BID PRN Arthor Captain, MD       LORazepam (ATIVAN) tablet 1 mg  1 mg Oral Q6H PRN Arthor Captain, MD   1 mg at 12/08/20 2229   magnesium hydroxide (MILK OF MAGNESIA) suspension 30 mL  30 mL Oral Daily PRN Rankin, Shuvon B, NP       metFORMIN (GLUCOPHAGE) tablet 500 mg  500 mg Oral BID WC Rankin, Shuvon B, NP   500 mg at 12/09/20 0733   OLANZapine zydis (ZYPREXA) disintegrating tablet 5 mg  5 mg Oral Q8H PRN Arthor Captain, MD       Oxcarbazepine (TRILEPTAL) tablet 300 mg  300 mg Oral BID Rankin, Shuvon B, NP   300 mg at 12/09/20 0733   propranolol (INDERAL) tablet 40 mg  40 mg Oral Q8H Arthor Captain, MD   40 mg at 12/09/20 1436   traZODone (DESYREL) tablet 100 mg  100 mg Oral QHS PRN Arthor Captain, MD   100 mg at 12/08/20 2124    Lab Results:  No results found for this or any previous visit (from the past 48 hour(s)).   Blood Alcohol level:  Lab Results  Component Value Date   ETH <10 11/29/2020   ETH <10 37/90/2409    Metabolic Disorder Labs: Lab Results  Component Value Date   HGBA1C 5.5 12/04/2020   MPG 111.15 12/04/2020   No results found for: PROLACTIN Lab Results  Component Value Date   CHOL 123 12/02/2020   TRIG 74 12/02/2020   HDL 35 (L) 12/02/2020   CHOLHDL 3.5 12/02/2020   VLDL 15 12/02/2020   LDLCALC 73 12/02/2020   LDLCALC 101 (H) 06/03/2019    Physical Findings: AIMS: Facial and Oral Movements Muscles of Facial Expression: None, normal Lips and Perioral Area: None, normal Jaw: None, normal Tongue: None,  normal,Extremity Movements Upper (arms, wrists, hands, fingers): None, normal Lower (legs, knees, ankles, toes): None, normal, Trunk Movements Neck, shoulders, hips: None, normal, Overall Severity Severity of abnormal movements (highest score from questions above): None, normal Incapacitation due to abnormal movements: None, normal Patient's awareness of abnormal movements (rate only patient's report): No Awareness, Dental Status Current problems with teeth and/or dentures?: No Does patient usually wear dentures?:  No  CIWA:    COWS:     Musculoskeletal: Strength & Muscle Tone: within normal limits Gait & Station: normal Patient leans: N/A  Psychiatric Specialty Exam:  Presentation  General Appearance: Disheveled  Eye Contact:Good  Speech:Clear and Coherent  Speech Volume:Normal  Handedness:Right   Mood and Affect  Mood:Anxious  Affect:Flat   Thought Process  Thought Processes:Coherent  Descriptions of Associations:Tangential  Orientation:Full (Time, Place and Person)  Thought Content:Delusions; Paranoid Ideation  History of Schizophrenia/Schizoaffective disorder:Yes  Duration of Psychotic Symptoms:Greater than six months  Hallucinations:Hallucinations: Auditory  Ideas of Reference:Delusions; Paranoia  Suicidal Thoughts:Suicidal Thoughts: No  Homicidal Thoughts:Homicidal Thoughts: No   Sensorium  Memory:Immediate Fair; Recent Fair  Judgment:Impaired  Insight:Shallow   Executive Functions  Concentration:Fair  Attention Span:Fair  [place]  Language:Good   Psychomotor Activity  Psychomotor Activity:Psychomotor Activity: Normal   Assets  Assets:Communication Skills; Housing; Social Support   Sleep  Sleep:Sleep: Good Number of Hours of Sleep: 3.25    Physical Exam: Physical Exam Vitals and nursing note reviewed.  Constitutional:      General: He is not in acute distress.    Appearance: He is not  diaphoretic.  HENT:     Head: Normocephalic and atraumatic.  Cardiovascular:     Rate and Rhythm: Normal rate.  Pulmonary:     Effort: Pulmonary effort is normal.  Neurological:     General: No focal deficit present.     Mental Status: He is alert and oriented to person, place, and time.   Review of Systems  Constitutional:  Negative for chills, diaphoresis and fever.  HENT:  Negative for sore throat.   Respiratory:  Negative for cough and shortness of breath.   Cardiovascular:  Negative for chest pain and palpitations.  Gastrointestinal:  Negative for constipation, diarrhea, nausea and vomiting.  Musculoskeletal:  Negative for myalgias.  Neurological:  Negative for dizziness, seizures and headaches.  Psychiatric/Behavioral:  Positive for hallucinations. Negative for depression, substance abuse and suicidal ideas. The patient is nervous/anxious. The patient does not have insomnia.   Blood pressure (!) 120/107, pulse 95, temperature (!) 97.3 F (36.3 C), temperature source Oral, resp. rate 16, height _0  (1.676 m), weight 134.3 kg, SpO2 100 %. Body mass index is 47.78 kg/m.   Treatment Plan Summary: Patient is a 25 year old male with schizoaffective disorder, bipolar type admitted for treatment and stabilization of worsening psychotic and mood symptoms, suicidal ideation and aggressive behavior.  Patient is displaying some improvement in organization of thought processes and is less exclusively focused on delusional themes.  Overall he continues to display ongoing symptoms of disorganized thought processes, delusional thought content, intrusiveness, and intermittent agitation.  Daily contact with patient to assess and evaluate symptoms and progress in treatment and Medication management  Schizoaffective disorder -Continue Prolixin to 10 mg PO each morning and 10 mg PO each evening for psychotic symptoms -Goal is for eventual transition to long-acting injectable antipsychotic (Prolixin  Decanoate) -Continue Trileptal 300 mg twice daily for mood stabilization  Anxiety/EPS -Continue propranolol to 40 mg Q8H for restlessness and anxiety -Continue lorazepam to 1 mg PO Q6H PRN anxiety, restlessness -Continue benztropine 0.5 mg PO twice daily  -Continue benztropine 0.5 mg PO Q6H PRN for tremors, muscle stiffness -Continue diphenhydramine 50 mg IM every 6 hours as needed acute dystonic reaction  Diabetes Mellitus -Continue metformin 500 mg twice daily with meals  Agitation  -Continue olanzapine 5 mg Q8H PRN agitation -Continue lorazepam 2 mg IM BID PRN  for severe agitation only if patient unable to take oral PRN medications for agitation  Discharge planning in progress  Ethelene Hal, NP 12/09/2020, 6:14 PM

## 2020-12-09 NOTE — Progress Notes (Signed)
Pt did not attend psychoeducational group. 

## 2020-12-09 NOTE — Progress Notes (Signed)
Adult Psychoeducational Group Note  Date:  12/09/2020 Time:  10:13 PM  Group Topic/Focus:  Wrap-Up Group:   The focus of this group is to help patients review their daily goal of treatment and discuss progress on daily workbooks.  Participation Level:  Minimal  Participation Quality:  Supportive  Affect:  Appropriate  Cognitive:  Appropriate and Lacking  Insight: Improving  Engagement in Group:  Improving  Modes of Intervention:  Support  Additional Comments: Review the patient's goals, progress, and objectives and how group skills were used on this date. Discuss increasing trust in self-efficacy, building ego-strength, esteem, and sense of worth. Provided supportive therapy focusing on relationship and trust building. Pt self-reporting has an overall day of 8 out of 10 on this date. End of Wrap-Up Group progress note.       Nicoletta Dress 12/09/2020, 10:13 PM

## 2020-12-09 NOTE — Plan of Care (Signed)
  Problem: Activity: Goal: Sleeping patterns will improve Outcome: Progressing   Problem: Coping: Goal: Ability to verbalize frustrations and anger appropriately will improve Outcome: Progressing Goal: Ability to demonstrate self-control will improve Outcome: Progressing   

## 2020-12-09 NOTE — Progress Notes (Signed)
Progress note    12/09/20 0733  Psych Admission Type (Psych Patients Only)  Admission Status Involuntary  Psychosocial Assessment  Patient Complaints Anxiety;Restlessness  Eye Contact Fair  Facial Expression Anxious;Pensive  Affect Anxious;Preoccupied  Chartered loss adjuster Assertive;Childlike  Motor Activity Pacing;Slow  Appearance/Hygiene In scrubs  Behavior Characteristics Cooperative;Appropriate to situation;Anxious;Pacing  Mood Anxious;Preoccupied;Pleasant  Thought Process  Coherency Loose associations  Content Preoccupation  Delusions None reported or observed  Perception Hallucinations  Hallucination Auditory  Judgment Poor  Confusion None  Danger to Self  Current suicidal ideation? Denies  Danger to Others  Danger to Others None reported or observed

## 2020-12-09 NOTE — Progress Notes (Signed)
Noah Ramsey was asleep at the beginning of the shift.  He did finally wake up and took his bedtime medications and snack.  He denied SI/HI or AVH but he was clearly responding to internal stimuli.  He requested trazodone for sleep but was still noted pacing in his room and hallway talking to himself.  Staff was able to redirect him to return to his room as to not disrupt the milieu.  PRN was given for restlessness at 10:29pm with minimal relief.  He was eventually able to fall asleep around 3:30am.  Q 15 minute checks maintained for safety.       12/08/20 2132  Psych Admission Type (Psych Patients Only)  Admission Status Involuntary  Psychosocial Assessment  Patient Complaints Anxiety;Restlessness  Eye Contact Fair  Facial Expression Blank  Affect Anxious;Preoccupied  Speech Soft;Slurred  Interaction Assertive;Childlike;Intrusive  Motor Activity Pacing;Slow  Appearance/Hygiene Improved;In scrubs  Behavior Characteristics Cooperative;Anxious;Restless  Mood Preoccupied;Pleasant  Thought Process  Coherency Disorganized;Loose associations;Tangential  Content Preoccupation  Delusions Paranoid  Perception Hallucinations  Hallucination Auditory  Judgment Poor  Confusion None  Danger to Self  Current suicidal ideation? Denies  Danger to Others  Danger to Others None reported or observed

## 2020-12-10 NOTE — Progress Notes (Signed)
[facility] MD Progress Note  12/10/2020 10:59 AM Noah Ramsey  MRN:  [id number]  Reason for admission: Patient is a 25 year old male with history of schizoaffective disorder admitted for insomnia x 5 days, aggressive behavior, delusions, suicidal ideation and auditory hallucinations.  Objective: Patient was seen and chart reviewed, case discussed with the treatment team. Patient's case discussed in detail with members of the treatment team. I met with and evaluated the patient on the unit today for follow-up, he was standing in his room, I stood at the door. He did not say or do anything intrusive or inappropriate. He stated he slept 10 hours last night, record reflects he slept 2.25 hours. Nursing reported that patient slept 6 hours during the afternoon yesterday. He reported a good appetite.   Patient shows some improvement today, however he continues to hear voices and respond to internal stimuli. When asked if he sees things he responded, "I have astigmatism but I fixed it myself by staring at the sun all day." He stated the voice inside his head told him how to fix his visual hallucinations. Eye contact is more appropriate and he is not engaging in prolonged staring. Articulation is improved and speech is easier to understand.  Thought processes are more organized and less tangential. He answers questions with one to two words but is appropriate with his answers. He is less hypersexual.  Patient denies SI, HI, AH or VH.  He denies tongue thickness, muscle spasms, trouble breathing, trouble swallowing, tremor or other medication side effects.  He denies any physical problems.  He reports that he is eating and sleeping well.    No new labs today.  Vital signs are stable and within normal limits.  Patient has been taking scheduled medications as prescribed.  He took trazodone 100 mg and Inderal 40 mg at bedtime.  Prolixin was increased yesterday to 10 mg BID. Staff document that patient continues to make  delusional statements, pace and periodically talk to himself in apparent response to internal stimuli.  No medication changes today.   Principal Problem: Schizoaffective disorder ([place]) Diagnosis: Principal Problem:   Schizoaffective disorder ([place])  Total Time spent with patient:  25 minutes  Past Psychiatric History: See admission H&P  Past Medical History:  Past Medical History:  Diagnosis Date   Elevated CPK    per patient   Schizophrenia ([place])    History reviewed. No pertinent surgical history. Family History:  Family History  Problem Relation Age of Onset   Mental illness Brother    Family Psychiatric  History: See admission H&P Social History:  Social History   Substance and Sexual Activity  Alcohol Use Never     Social History   Substance and Sexual Activity  Drug Use Never    Social History   Socioeconomic History   Marital status: Single    Spouse name: Not on file   Number of children: Not on file   Years of education: Not on file   Highest education level: Not on file  Occupational History   Not on file  Tobacco Use   Smoking status: Never   Smokeless tobacco: Never  Substance and Sexual Activity   Alcohol use: Never   Drug use: Never   Sexual activity: Not on file  Other Topics Concern   Not on file  Social History Narrative   Not on file   Social Determinants of Health   Financial Resource Strain: Not on file  Food Insecurity: Not on file  Transportation  Needs: Not on file  Physical Activity: Not on file  Stress: Not on file  Social Connections: Not on file   Additional Social History:                         Sleep: Poor  Appetite:  Good  Current Medications: Current Facility-Administered Medications  Medication Dose Route Frequency Provider Last Rate Last Admin   acetaminophen (TYLENOL) tablet 650 mg  650 mg Oral Q6H PRN Rankin, Shuvon B, NP   650 mg at 12/08/20 0359   alum & mag hydroxide-simeth (MAALOX/MYLANTA)  200-200-20 MG/5ML suspension 30 mL  30 mL Oral Q4H PRN Rankin, Shuvon B, NP       benztropine (COGENTIN) tablet 0.5 mg  0.5 mg Oral BID Arthor Captain, MD   0.5 mg at 12/10/20 0754   benztropine (COGENTIN) tablet 0.5 mg  0.5 mg Oral Q6H PRN Arthor Captain, MD       diphenhydrAMINE (BENADRYL) injection 50 mg  50 mg Intramuscular Q6H PRN Arthor Captain, MD       feeding supplement (ENSURE ENLIVE / ENSURE PLUS) liquid 237 mL  237 mL Oral BID BM Arthor Captain, MD   237 mL at 12/10/20 0959   fluPHENAZine (PROLIXIN) tablet 10 mg  10 mg Oral Daily Arthor Captain, MD   10 mg at 12/10/20 0754   fluPHENAZine (PROLIXIN) tablet 10 mg  10 mg Oral QPM Arthor Captain, MD   10 mg at 12/09/20 1849   LORazepam (ATIVAN) injection 2 mg  2 mg Intramuscular BID PRN Arthor Captain, MD       LORazepam (ATIVAN) tablet 1 mg  1 mg Oral Q6H PRN Arthor Captain, MD   1 mg at 12/08/20 2229   magnesium hydroxide (MILK OF MAGNESIA) suspension 30 mL  30 mL Oral Daily PRN Rankin, Shuvon B, NP       metFORMIN (GLUCOPHAGE) tablet 500 mg  500 mg Oral BID WC Rankin, Shuvon B, NP   500 mg at 12/10/20 0754   OLANZapine zydis (ZYPREXA) disintegrating tablet 5 mg  5 mg Oral Q8H PRN Arthor Captain, MD       Oxcarbazepine (TRILEPTAL) tablet 300 mg  300 mg Oral BID Rankin, Shuvon B, NP   300 mg at 12/10/20 0755   propranolol (INDERAL) tablet 40 mg  40 mg Oral Q8H Arthor Captain, MD   40 mg at 12/10/20 0755   traZODone (DESYREL) tablet 100 mg  100 mg Oral QHS PRN Arthor Captain, MD   100 mg at 12/09/20 2120    Lab Results:  No results found for this or any previous visit (from the past 63 hour(s)).   Blood Alcohol level:  Lab Results  Component Value Date   ETH <10 11/29/2020   ETH <10 67/67/2094    Metabolic Disorder Labs: Lab Results  Component Value Date   HGBA1C 5.5 12/04/2020   MPG 111.15 12/04/2020   No results found for: PROLACTIN Lab Results  Component Value Date   CHOL 123 12/02/2020   TRIG 74 12/02/2020    HDL 35 (L) 12/02/2020   CHOLHDL 3.5 12/02/2020   VLDL 15 12/02/2020   LDLCALC 73 12/02/2020   LDLCALC 101 (H) 06/03/2019    Physical Findings: AIMS: Facial and Oral Movements Muscles of Facial Expression: None, normal Lips and Perioral Area: None, normal Jaw: None, normal Tongue: None, normal,Extremity Movements Upper (arms, wrists, hands, fingers): None, normal  Lower (legs, knees, ankles, toes): None, normal, Trunk Movements Neck, shoulders, hips: None, normal, Overall Severity Severity of abnormal movements (highest score from questions above): None, normal Incapacitation due to abnormal movements: None, normal Patient's awareness of abnormal movements (rate only patient's report): No Awareness, Dental Status Current problems with teeth and/or dentures?: No Does patient usually wear dentures?: No  CIWA:    COWS:     Musculoskeletal: Strength & Muscle Tone: within normal limits Gait & Station: normal Patient leans: N/A  Psychiatric Specialty Exam:  Presentation  General Appearance: Disheveled  Eye Contact:Good  Speech:Clear and Coherent  Speech Volume:Normal  Handedness:Right   Mood and Affect  Mood:Anxious  Affect:Flat   Thought Process  Thought Processes:Coherent  Descriptions of Associations:Tangential  Orientation:Full (Time, Place and Person)  Thought Content:Delusions; Paranoid Ideation  History of Schizophrenia/Schizoaffective disorder:Yes  Duration of Psychotic Symptoms:Greater than six months  Hallucinations:No data recorded  Ideas of Reference:Delusions; Paranoia  Suicidal Thoughts:No data recorded  Homicidal Thoughts:No data recorded   Sensorium  Memory:Immediate Fair; Recent Fair  Judgment:Impaired  Insight:Shallow   Executive Functions  Concentration:Fair  Attention Span:Fair  [place]  Language:Good   Psychomotor Activity  Psychomotor Activity:No data recorded   Assets   Assets:Communication Skills; Housing; Social Support   Sleep  Sleep:No data recorded    Physical Exam: Physical Exam Vitals and nursing note reviewed.  Constitutional:      General: He is not in acute distress.    Appearance: He is not diaphoretic.  HENT:     Head: Normocephalic and atraumatic.  Cardiovascular:     Rate and Rhythm: Normal rate.  Pulmonary:     Effort: Pulmonary effort is normal.  Neurological:     General: No focal deficit present.     Mental Status: He is alert and oriented to person, place, and time.   Review of Systems  Constitutional:  Negative for chills, diaphoresis and fever.  HENT:  Negative for sore throat.   Respiratory:  Negative for cough and shortness of breath.   Cardiovascular:  Negative for chest pain and palpitations.  Gastrointestinal:  Negative for constipation, diarrhea, nausea and vomiting.  Musculoskeletal:  Negative for myalgias.  Neurological:  Negative for dizziness, seizures and headaches.  Psychiatric/Behavioral:  Positive for hallucinations. Negative for depression, substance abuse and suicidal ideas. The patient is nervous/anxious. The patient does not have insomnia.   Blood pressure (!) 106/59, pulse 93, temperature 97.8 F (36.6 C), temperature source Oral, resp. rate 16, height _0  (1.676 m), weight 134.3 kg, SpO2 98 %. Body mass index is 47.78 kg/m.   Treatment Plan Summary: Patient is a 25 year old male with schizoaffective disorder, bipolar type admitted for treatment and stabilization of worsening psychotic and mood symptoms, suicidal ideation and aggressive behavior.  Patient is displaying some improvement in organization of thought processes and is less exclusively focused on delusional themes.  Overall he continues to display ongoing symptoms of disorganized thought processes, delusional thought content, intrusiveness, and intermittent agitation.  Daily contact with patient to assess and evaluate symptoms and progress  in treatment and Medication management  Schizoaffective disorder -Continue Prolixin to 10 mg PO each morning and 10 mg PO each evening for psychotic symptoms -Goal is for eventual transition to long-acting injectable antipsychotic (Prolixin Decanoate) -Continue Trileptal 300 mg twice daily for mood stabilization  Anxiety/EPS -Continue propranolol to 40 mg Q8H for restlessness and anxiety -Continue lorazepam to 1 mg PO Q6H PRN anxiety, restlessness -Continue benztropine 0.5 mg PO twice  daily  -Continue benztropine 0.5 mg PO Q6H PRN for tremors, muscle stiffness -Continue diphenhydramine 50 mg IM every 6 hours as needed acute dystonic reaction  Diabetes Mellitus -Continue metformin 500 mg twice daily with meals  Agitation  -Continue olanzapine 5 mg Q8H PRN agitation -Continue lorazepam 2 mg IM BID PRN for severe agitation only if patient unable to take oral PRN medications for agitation  Continue every 15 minute safety checks Discharge planning in progress  Ethelene Hal, NP 12/10/2020, 4:18 PM

## 2020-12-10 NOTE — Progress Notes (Signed)
Progress note    12/10/20 0755  Psych Admission Type (Psych Patients Only)  Admission Status Involuntary  Psychosocial Assessment  Patient Complaints Anxiety  Eye Contact Fair  Facial Expression Animated;Anxious  Affect Anxious  Speech Logical/coherent  Interaction Assertive  Motor Activity Slow  Appearance/Hygiene In scrubs  Behavior Characteristics Cooperative;Appropriate to situation;Anxious  Mood Anxious;Pleasant  Thought Process  Coherency Loose associations  Content Preoccupation  Delusions None reported or observed  Perception Hallucinations  Hallucination Auditory  Judgment Poor  Confusion None  Danger to Self  Current suicidal ideation? Denies  Danger to Others  Danger to Others None reported or observed

## 2020-12-10 NOTE — Plan of Care (Signed)
  Problem: Education: Goal: Mental status will improve Outcome: Progressing   Problem: Activity: Goal: Interest or engagement in activities will improve Outcome: Progressing Goal: Sleeping patterns will improve Outcome: Progressing   

## 2020-12-10 NOTE — Progress Notes (Signed)
Pt did not attend relaxation group.  

## 2020-12-10 NOTE — Progress Notes (Signed)
Pt did not attend orientation/goals group. 

## 2020-12-11 DIAGNOSIS — F250 Schizoaffective disorder, bipolar type: Secondary | ICD-10-CM | POA: Diagnosis not present

## 2020-12-11 LAB — URINALYSIS, COMPLETE (UACMP) WITH MICROSCOPIC
Bacteria, UA: NONE SEEN
Bilirubin Urine: NEGATIVE
Glucose, UA: NEGATIVE mg/dL
Hgb urine dipstick: NEGATIVE
Ketones, ur: NEGATIVE mg/dL
Leukocytes,Ua: NEGATIVE
Nitrite: NEGATIVE
Protein, ur: NEGATIVE mg/dL
Specific Gravity, Urine: 1.018 (ref 1.005–1.030)
pH: 7 (ref 5.0–8.0)

## 2020-12-11 MED ORDER — BENZTROPINE MESYLATE 1 MG PO TABS
1.0000 mg | ORAL_TABLET | Freq: Two times a day (BID) | ORAL | Status: DC
  Administered 2020-12-11 – 2020-12-13 (×4): 1 mg via ORAL
  Filled 2020-12-11 (×9): qty 1

## 2020-12-11 NOTE — Progress Notes (Signed)
[facility] MD Progress Note  12/11/2020 2:37 PM Noah Ramsey  MRN:  [id number]  Reason for admission: Patient is a 25 year old male with history of schizoaffective disorder admitted for insomnia x 5 days, aggressive behavior, delusions, suicidal ideation and auditory hallucinations.  Objective: Medical record reviewed.  Patient's case discussed in detail with members of the treatment team.  I met with and evaluated the patient on the unit today for follow-up today.  Patient's presentation is similar to that on Friday.  He is pleasant and cooperative with our interaction.  He displays periodic intense staring.  Articulation is not as clear today as it was last week.  Patient continues to make grandiose and paranoid delusional statements and occasional inappropriate sexual remarks.  He endorses auditory hallucinations of people cursing at him.  Patient denies VH, SI, AI or HI.  He states that he is sleeping and eating okay.  Patient denies physical problems or medication side effects.  Staff recorded that patient slept 6.75 hours last night.  No new labs today.  Vital signs are stable and within normal limits.  Staff document the patient has been pleasant on approach but continues to display disorganized thinking.  Patient is taking scheduled medications as prescribed.  He has not taken any PRN medications for almost 48 hours.    Principal Problem: Schizoaffective disorder ([place]) Diagnosis: Principal Problem:   Schizoaffective disorder ([place])  Total Time spent with patient:  25 minutes  Past Psychiatric History: See admission H&P  Past Medical History:  Past Medical History:  Diagnosis Date   Elevated CPK    per patient   Schizophrenia ([place])    History reviewed. No pertinent surgical history. Family History:  Family History  Problem Relation Age of Onset   Mental illness Brother    Family Psychiatric  History: See admission H&P Social History:  Social History   Substance and Sexual  Activity  Alcohol Use Never     Social History   Substance and Sexual Activity  Drug Use Never    Social History   Socioeconomic History   Marital status: Single    Spouse name: Not on file   Number of children: Not on file   Years of education: Not on file   Highest education level: Not on file  Occupational History   Not on file  Tobacco Use   Smoking status: Never   Smokeless tobacco: Never  Substance and Sexual Activity   Alcohol use: Never   Drug use: Never   Sexual activity: Not on file  Other Topics Concern   Not on file  Social History Narrative   Not on file   Social Determinants of Health   Financial Resource Strain: Not on file  Food Insecurity: Not on file  Transportation Needs: Not on file  Physical Activity: Not on file  Stress: Not on file  Social Connections: Not on file   Additional Social History:                         Sleep: Good  Appetite:  Good  Current Medications: Current Facility-Administered Medications  Medication Dose Route Frequency Provider Last Rate Last Admin   acetaminophen (TYLENOL) tablet 650 mg  650 mg Oral Q6H PRN Rankin, Shuvon B, NP   650 mg at 12/08/20 0359   alum & mag hydroxide-simeth (MAALOX/MYLANTA) 200-200-20 MG/5ML suspension 30 mL  30 mL Oral Q4H PRN Rankin, Shuvon B, NP       benztropine (COGENTIN)  tablet 0.5 mg  0.5 mg Oral Q6H PRN Arthor Captain, MD       benztropine (COGENTIN) tablet 1 mg  1 mg Oral BID Arthor Captain, MD       diphenhydrAMINE (BENADRYL) injection 50 mg  50 mg Intramuscular Q6H PRN Arthor Captain, MD       feeding supplement (ENSURE ENLIVE / ENSURE PLUS) liquid 237 mL  237 mL Oral BID BM Arthor Captain, MD   237 mL at 12/10/20 1407   fluPHENAZine (PROLIXIN) tablet 10 mg  10 mg Oral Daily Arthor Captain, MD   10 mg at 12/11/20 6734   fluPHENAZine (PROLIXIN) tablet 10 mg  10 mg Oral QPM Arthor Captain, MD   10 mg at 12/10/20 1806   LORazepam (ATIVAN) injection 2 mg  2 mg  Intramuscular BID PRN Arthor Captain, MD       LORazepam (ATIVAN) tablet 1 mg  1 mg Oral Q6H PRN Arthor Captain, MD   1 mg at 12/08/20 2229   magnesium hydroxide (MILK OF MAGNESIA) suspension 30 mL  30 mL Oral Daily PRN Rankin, Shuvon B, NP       metFORMIN (GLUCOPHAGE) tablet 500 mg  500 mg Oral BID WC Rankin, Shuvon B, NP   500 mg at 12/11/20 0747   OLANZapine zydis (ZYPREXA) disintegrating tablet 5 mg  5 mg Oral Q8H PRN Arthor Captain, MD       Oxcarbazepine (TRILEPTAL) tablet 300 mg  300 mg Oral BID Rankin, Shuvon B, NP   300 mg at 12/11/20 0748   propranolol (INDERAL) tablet 40 mg  40 mg Oral Q8H Arthor Captain, MD   40 mg at 12/11/20 0551   traZODone (DESYREL) tablet 100 mg  100 mg Oral QHS PRN Arthor Captain, MD   100 mg at 12/09/20 2120    Lab Results:  No results found for this or any previous visit (from the past 33 hour(s)).   Blood Alcohol level:  Lab Results  Component Value Date   ETH <10 11/29/2020   ETH <10 19/37/9024    Metabolic Disorder Labs: Lab Results  Component Value Date   HGBA1C 5.5 12/04/2020   MPG 111.15 12/04/2020   No results found for: PROLACTIN Lab Results  Component Value Date   CHOL 123 12/02/2020   TRIG 74 12/02/2020   HDL 35 (L) 12/02/2020   CHOLHDL 3.5 12/02/2020   VLDL 15 12/02/2020   LDLCALC 73 12/02/2020   LDLCALC 101 (H) 06/03/2019    Physical Findings: AIMS: Facial and Oral Movements Muscles of Facial Expression: None, normal Lips and Perioral Area: None, normal Jaw: None, normal Tongue: None, normal,Extremity Movements Upper (arms, wrists, hands, fingers): None, normal Lower (legs, knees, ankles, toes): None, normal, Trunk Movements Neck, shoulders, hips: None, normal, Overall Severity Severity of abnormal movements (highest score from questions above): None, normal Incapacitation due to abnormal movements: None, normal Patient's awareness of abnormal movements (rate only patient's report): No Awareness, Dental  Status Current problems with teeth and/or dentures?: No Does patient usually wear dentures?: No  CIWA:    COWS:     Musculoskeletal: Strength & Muscle Tone: within normal limits Gait & Station: normal Patient leans: N/A  Psychiatric Specialty Exam:  Presentation  General Appearance: Casual; Fairly Groomed  Eye Contact:Good; Other (comment) (Intermittent intense stares)  Speech:Normal Rate  Speech Volume:Normal  Handedness:Right   Mood and Affect  Mood:Anxious  Affect:Flat   Thought Process  Thought Processes:Coherent  Descriptions of Associations:Tangential  Orientation:Full (Time, Place and Person)  Thought Content:Delusions; Paranoid Ideation; Tangential  History of Schizophrenia/Schizoaffective disorder:Yes  Duration of Psychotic Symptoms:Greater than six months  Hallucinations:Hallucinations: Auditory  Ideas of Reference:Delusions; Paranoia  Suicidal Thoughts:Suicidal Thoughts: No  Homicidal Thoughts:Homicidal Thoughts: No   Sensorium  Memory:Immediate Fair; Recent Fair  Judgment:Impaired  Insight:Shallow   Executive Functions  Concentration:Fair  Attention Span:Fair  [place]   Psychomotor Activity  Psychomotor Activity:Psychomotor Activity: Normal   Assets  Assets:Communication Skills; Housing; Social Support   Sleep  Sleep:Sleep: Good Number of Hours of Sleep: 6.75    Physical Exam: Physical Exam Vitals and nursing note reviewed.  Constitutional:      General: He is not in acute distress.    Appearance: He is not diaphoretic.  HENT:     Head: Normocephalic and atraumatic.  Cardiovascular:     Rate and Rhythm: Normal rate.  Pulmonary:     Effort: Pulmonary effort is normal.  Neurological:     General: No focal deficit present.     Mental Status: He is alert and oriented to person, place, and time.   Review of Systems  Constitutional:  Negative for chills, diaphoresis  and fever.  HENT:  Negative for sore throat.   Respiratory:  Negative for cough and shortness of breath.   Cardiovascular:  Negative for chest pain and palpitations.  Gastrointestinal:  Negative for constipation, diarrhea, nausea and vomiting.  Musculoskeletal:  Negative for myalgias.  Neurological:  Negative for dizziness, seizures and headaches.  Psychiatric/Behavioral:  Positive for hallucinations. Negative for depression, substance abuse and suicidal ideas. The patient is nervous/anxious. The patient does not have insomnia.   Blood pressure 117/70, pulse 87, temperature 97.8 F (36.6 C), temperature source Oral, resp. rate 16, height 5' 6"  (1.676 m), weight 134.3 kg, SpO2 98 %. Body mass index is 47.78 kg/m.   Treatment Plan Summary: Patient is a 25 year old male with schizoaffective disorder, bipolar type admitted for treatment and stabilization of worsening psychotic and mood symptoms, suicidal ideation and aggressive behavior.  Patient is displaying some improvement in organization of thought processes and is less exclusively focused on delusional themes.  He is calmer, sleeping better and less intrusive.  Overall he continues to display ongoing symptoms of disorganized thought processes, delusional thought content, and hallucinations.  Daily contact with patient to assess and evaluate symptoms and progress in treatment and Medication management  Schizoaffective disorder -Continue Prolixin 10 mg PO each morning and 10 mg PO each evening for psychotic symptoms -Goal is for eventual transition to long-acting injectable antipsychotic (Prolixin Decanoate) -Continue Trileptal 300 mg twice daily for mood stabilization  Anxiety/EPS -Continue propranolol 40 mg Q8H for restlessness and anxiety -Continue lorazepam to 1 mg PO Q6H PRN anxiety, restlessness -Increase benztropine 1 mg PO twice daily to reduce EPS.  Patient with mild dysarthria this morning that was not present at the end of last  week. -Continue benztropine 0.5 mg PO Q6H PRN for tremors, muscle stiffness -Continue diphenhydramine 50 mg IM every 6 hours as needed acute dystonic reaction  Diabetes Mellitus -Continue metformin 500 mg twice daily with meals  Agitation  -Continue olanzapine 5 mg Q8H PRN agitation -Continue lorazepam 2 mg IM BID PRN for severe agitation only if patient unable to take oral PRN medications for agitation  Insomnia -Continue trazodone 100 mg QHS PRN  Discharge planning in progress  Arthor Captain, MD 12/11/2020, 2:37 PM

## 2020-12-11 NOTE — Progress Notes (Signed)
Recreation Therapy Notes  Date: 8.22.22 Time: 1000 Location: [address] Dayroom  Group Topic: Coping Skills  Goal Area(s) Addresses:  Patient will identify difference between positive and negative coping skills. Patient will identify positive coping skills to use post d/c.  Behavioral Response: None  Intervention: Mind Map  Activity: Mind Map.  LRT and patients filled in the first 8 boxes (anger, anxiety, depression, feeling hopeless, loss of appetite, family conflict, lack of confidence and triggers) of the mind map together.  Patients were then given time to come up with at least 3 coping skills for each situation identified.  LRT would then write the coping skills patients came up with on the board so patients could fill in any blank spots on their sheets.  Education: Pharmacologist, Building control surveyor.   Education Outcome: Acknowledges understanding/In group clarification offered/Needs additional education.   Clinical Observations/Feedback: Pt did not participate in group.  Pt left and came back.  Pt just sat by the window.    Caroll Rancher, LRT/CTRS         Caroll Rancher A 12/11/2020 12:55 PM

## 2020-12-11 NOTE — [facility] Group Notes (Signed)
[facility] LCSW Group Therapy    Topic: Strengths Exploration  Due to the acuity group was not held. Patient was provided therapeutic worksheets and asked to meet with CSW as needed.     Shara Hartis MSW, LCSW Clincal Social Worker  [facility]  

## 2020-12-11 NOTE — Plan of Care (Signed)
Patient has improved affect. Alert and oriented x4. Cooperative. Continues to experience disorganized thinking but pleasant on approach. Received HS medications and had a snack. Patient went to bed and has been sleeping. Safety monitored as expected.

## 2020-12-11 NOTE — [facility] Group Notes (Signed)
Pt did not attend group. 

## 2020-12-11 NOTE — Progress Notes (Signed)
   12/11/20 1729  Vital Signs  Pulse Rate 99  Pulse Rate Source Monitor  BP 99/75  BP Location Left Arm  BP Method Automatic  Patient Position (if appropriate) Standing   D: Patient denies SI/HI/AVH. Patient denies both anxiety and depression. Patient was out in open areas and was social with peers and staff. Pt. Attended group. Pt. Told the nurse in the afternoon that he was a demon. Pt. Was smiling. Pt. Was cooperative and pleasant.  A:  Patient took scheduled medicine.  Support and encouragement provided Routine safety checks conducted every 15 minutes. Patient  Informed to notify staff with any concerns.   R:  Safety maintained.

## 2020-12-11 NOTE — Progress Notes (Signed)
Noah Ramsey was up and visible on the unit.  He denied any SI/HI.  He stated he had a good day but didn't elaborate much.  He continues to admit to auditory hallucinations and denies they are command hallucinations.  He denied VH.  He denied any pain or discomfort and appeared to be in no physical distress.  He was silly at times, joking with staff.  He took his hs medications along with prn trazodone without difficulty.  He had no relief from the trazodone and was noted pacing his room talking to himself.  He was given Ativan for his restlessness with good relief.  He is currently resting with his eyes closed and appears to be asleep.     12/11/20 2106  Psych Admission Type (Psych Patients Only)  Admission Status Involuntary  Psychosocial Assessment  Patient Complaints Insomnia  Eye Contact Fair  Facial Expression Flat  Affect Silly;Anxious  Speech Logical/coherent  Interaction Childlike;Assertive  Motor Activity Restless  Appearance/Hygiene Unremarkable  Behavior Characteristics Cooperative;Appropriate to situation  Mood Anxious;Pleasant  Thought Process  Coherency Loose associations  Content Preoccupation  Delusions None reported or observed  Perception Hallucinations  Hallucination Auditory  Judgment Poor  Confusion None  Danger to Self  Current suicidal ideation? Denies  Danger to Others  Danger to Others None reported or observed

## 2020-12-12 MED ORDER — FLUPHENAZINE HCL 2.5 MG PO TABS
7.5000 mg | ORAL_TABLET | Freq: Every evening | ORAL | Status: DC
  Administered 2020-12-12: 7.5 mg via ORAL
  Filled 2020-12-12 (×3): qty 3

## 2020-12-12 MED ORDER — QUETIAPINE FUMARATE 100 MG PO TABS
100.0000 mg | ORAL_TABLET | Freq: Every day | ORAL | Status: DC
  Administered 2020-12-12: 100 mg via ORAL
  Filled 2020-12-12 (×4): qty 1

## 2020-12-12 MED ORDER — TRAZODONE HCL 50 MG PO TABS: 50.0000 mg | ORAL_TABLET | Freq: Every evening | ORAL | Status: DC | PRN

## 2020-12-12 MED ORDER — HYDROXYZINE HCL 25 MG PO TABS
25.0000 mg | ORAL_TABLET | Freq: Four times a day (QID) | ORAL | Status: DC | PRN
  Administered 2020-12-14 – 2020-12-25 (×8): 25 mg via ORAL
  Filled 2020-12-12 (×9): qty 1

## 2020-12-12 MED ORDER — FLUPHENAZINE HCL 2.5 MG PO TABS
7.5000 mg | ORAL_TABLET | Freq: Every day | ORAL | Status: DC
  Administered 2020-12-13: 7.5 mg via ORAL
  Filled 2020-12-12 (×3): qty 3

## 2020-12-12 MED ORDER — LORAZEPAM 1 MG PO TABS
1.0000 mg | ORAL_TABLET | Freq: Four times a day (QID) | ORAL | Status: DC | PRN
  Administered 2020-12-14 – 2020-12-27 (×4): 1 mg via ORAL
  Filled 2020-12-12 (×4): qty 1

## 2020-12-12 NOTE — Progress Notes (Signed)
Recreation Therapy Notes  Date: 8.23.22 Time: 1000 Location: 500 Hall Dayroom  Group Topic: Wellness  Goal Area(s) Addresses:  Patient will define components of whole wellness. Patient will verbalize benefit of whole wellness.  Intervention: Exercise, Music  Activity: Exercise.  LRT led the group in a series of stretches to get them loosened up for the activity.  Patients would then take turns leading the group in an exercise or dance move of their choosing.  Patients could take breaks or get water as needed.  Education: Wellness, Building control surveyor.   Education Outcome: Acknowledges education/In group clarification offered/Needs additional education.   Clinical Observations/Feedback: Pt did not attend group.     Caroll Rancher, LRT/CTRS         Caroll Rancher A 12/12/2020 12:01 PM

## 2020-12-12 NOTE — Progress Notes (Signed)
   12/12/20 1508  Vital Signs  Pulse Rate 98  Pulse Rate Source Monitor  BP (!) 108/55  BP Location Left Arm  BP Method Automatic  Patient Position (if appropriate) Standing   D: Patient denies SI/HI/AVH. Pt. Denies both anxiety and depression. Patient isolated himself in his room. Pt. Did go downstairs to the gym for group.  A:  Patient took scheduled medicine.  Support and encouragement provided Routine safety checks conducted every 15 minutes. Patient  Informed to notify staff with any concerns.   R: Safety maintained.

## 2020-12-12 NOTE — Progress Notes (Signed)
Noah Ramsey was isolative to his room this evening.  He did not attend evening wrap up group.  He denied SI/HI or visual hallucinations.  He continues to report hearing demons talking to him.  He was pleasant and playful at the medication window.  He took his bedtime medication without difficulty.  Educated him about the Seroquel he took tonight and hopefully it will be helpful for sleep and psychosis.  No prn's required at this time.  He was noted pacing around his room and hallway talking to himself prior to laying down.  He is currently resting with his eyes closed and appeared to be asleep.  Q 15 minute checks maintained for safety.   12/12/20 2113  Psych Admission Type (Psych Patients Only)  Admission Status Involuntary  Psychosocial Assessment  Patient Complaints Anxiety;Insomnia  Eye Contact Brief  Facial Expression Fixed smile  Affect Silly;Anxious  Speech Slurred;Soft  Interaction Childlike;Assertive  Motor Activity Restless  Appearance/Hygiene Unremarkable  Behavior Characteristics Cooperative;Appropriate to situation  Mood Anxious;Pleasant  Thought Process  Coherency Loose associations  Content Preoccupation  Delusions None reported or observed  Perception Hallucinations  Hallucination Auditory  Judgment Poor  Confusion None  Danger to Self  Current suicidal ideation? Denies  Danger to Others  Danger to Others None reported or observed

## 2020-12-12 NOTE — [facility] Group Notes (Signed)
Pt didn't attend group. 

## 2020-12-12 NOTE — Progress Notes (Signed)
[facility] MD Progress Note  12/12/2020 3:31 PM Noah Ramsey  MRN:  [id number]  Reason for admission: Patient is a 25 year old male with history of schizoaffective disorder admitted for insomnia x 5 days, aggressive behavior, delusions, suicidal ideation and auditory hallucinations.  Objective: Medical record reviewed.  Patient's case discussed in detail with members of the treatment team.  I met with and evaluated the patient on the unit today for follow-up today.  Patient looks unchanged from yesterday.  His speech is mildly dysarthric.  He continues to make grandiose paranoid delusional statements about demons and being the antichrist.  He makes fewer hypersexual comments.  Patient reports hearing AH of voices telling him who is good and who is bad.  He denies dangerous command hallucinations.  He denies SI.  He reports vague nonspecific AI toward his mother's boyfriend and patient blames for "being arrested."  He denies thoughts of harming anyone in the hospital.  Patient denies trouble swallowing, trouble breathing, muscle stiffness, tremor.  He states that he is not sleeping well at night and has been sleeping much of the day.  Staff did not record his hours of sleep last night.  The patient is orthostatic this morning with BP of 93/57 sitting and 86/54 standing; pulse of 81 sitting and 96 standing; O2 sat of 100% on room air and temperature of 98.4.  Urinalysis performed last evening was WNL.  No other new labs today.  The patient is taking scheduled medications as prescribed.  The patient took PRN trazodone 100 mg PO x 1 and PRN lorazepam 1 mg PO x 1 last night.  Staff report that patient has been visible in the milieu and social with peers and staff.  He continues to endorse AH and make intermittent statements about demons.  Principal Problem: Schizoaffective disorder ([place]) Diagnosis: Principal Problem:   Schizoaffective disorder ([place])  Total Time spent with patient:  20 minutes  Past  Psychiatric History: See admission H&P  Past Medical History:  Past Medical History:  Diagnosis Date   Elevated CPK    per patient   Schizophrenia ([place])    History reviewed. No pertinent surgical history. Family History:  Family History  Problem Relation Age of Onset   Mental illness Brother    Family Psychiatric  History: See admission H&P Social History:  Social History   Substance and Sexual Activity  Alcohol Use Never     Social History   Substance and Sexual Activity  Drug Use Never    Social History   Socioeconomic History   Marital status: Single    Spouse name: Not on file   Number of children: Not on file   Years of education: Not on file   Highest education level: Not on file  Occupational History   Not on file  Tobacco Use   Smoking status: Never   Smokeless tobacco: Never  Substance and Sexual Activity   Alcohol use: Never   Drug use: Never   Sexual activity: Not on file  Other Topics Concern   Not on file  Social History Narrative   Not on file   Social Determinants of Health   Financial Resource Strain: Not on file  Food Insecurity: Not on file  Transportation Needs: Not on file  Physical Activity: Not on file  Stress: Not on file  Social Connections: Not on file   Additional Social History:  Sleep: Good  Appetite:  Good  Current Medications: Current Facility-Administered Medications  Medication Dose Route Frequency Provider Last Rate Last Admin   acetaminophen (TYLENOL) tablet 650 mg  650 mg Oral Q6H PRN Rankin, Shuvon B, NP   650 mg at 12/08/20 0359   alum & mag hydroxide-simeth (MAALOX/MYLANTA) 200-200-20 MG/5ML suspension 30 mL  30 mL Oral Q4H PRN Rankin, Shuvon B, NP       benztropine (COGENTIN) tablet 0.5 mg  0.5 mg Oral Q6H PRN Arthor Captain, MD       benztropine (COGENTIN) tablet 1 mg  1 mg Oral BID Arthor Captain, MD   1 mg at 12/12/20 0827   diphenhydrAMINE (BENADRYL) injection 50 mg  50  mg Intramuscular Q6H PRN Arthor Captain, MD       [START ON 12/13/2020] fluPHENAZine (PROLIXIN) tablet 7.5 mg  7.5 mg Oral Daily Arthor Captain, MD       fluPHENAZine (PROLIXIN) tablet 7.5 mg  7.5 mg Oral QPM Arthor Captain, MD       hydrOXYzine (ATARAX/VISTARIL) tablet 25 mg  25 mg Oral Q6H PRN Arthor Captain, MD       LORazepam (ATIVAN) injection 2 mg  2 mg Intramuscular BID PRN Arthor Captain, MD       LORazepam (ATIVAN) tablet 1 mg  1 mg Oral Q6H PRN Arthor Captain, MD   1 mg at 12/11/20 2342   magnesium hydroxide (MILK OF MAGNESIA) suspension 30 mL  30 mL Oral Daily PRN Rankin, Shuvon B, NP       metFORMIN (GLUCOPHAGE) tablet 500 mg  500 mg Oral BID WC Rankin, Shuvon B, NP   500 mg at 12/12/20 0825   OLANZapine zydis (ZYPREXA) disintegrating tablet 5 mg  5 mg Oral Q8H PRN Arthor Captain, MD       Oxcarbazepine (TRILEPTAL) tablet 300 mg  300 mg Oral BID Rankin, Shuvon B, NP   300 mg at 12/12/20 6237   propranolol (INDERAL) tablet 40 mg  40 mg Oral Q8H Arthor Captain, MD   40 mg at 12/11/20 2107   QUEtiapine (SEROQUEL) tablet 100 mg  100 mg Oral QHS Arthor Captain, MD        Lab Results:  Results for orders placed or performed during the hospital encounter of 12/03/20 (from the past 48 hour(s))  Urinalysis, Complete w Microscopic Urine, Clean Catch     Status: None   Collection Time: 12/11/20  6:17 PM  Result Value Ref Range   Color, Urine YELLOW YELLOW   APPearance CLEAR CLEAR   Specific Gravity, Urine 1.018 1.005 - 1.030   pH 7.0 5.0 - 8.0   Glucose, UA NEGATIVE NEGATIVE mg/dL   Hgb urine dipstick NEGATIVE NEGATIVE   Bilirubin Urine NEGATIVE NEGATIVE   Ketones, ur NEGATIVE NEGATIVE mg/dL   Protein, ur NEGATIVE NEGATIVE mg/dL   Nitrite NEGATIVE NEGATIVE   Leukocytes,Ua NEGATIVE NEGATIVE   RBC / HPF 0-5 0 - 5 RBC/hpf   WBC, UA 0-5 0 - 5 WBC/hpf   Bacteria, UA NONE SEEN NONE SEEN    Comment: Performed at [facility], [place] [address] [place], [place]  [address]     Blood Alcohol level:  Lab Results  Component Value Date   [facility] <10 11/29/2020   ETH <10 51/76/1607    Metabolic Disorder Labs: Lab Results  Component Value Date   HGBA1C 5.5 12/04/2020   MPG 111.15 12/04/2020   No results  found for: PROLACTIN Lab Results  Component Value Date   CHOL 123 12/02/2020   TRIG 74 12/02/2020   HDL 35 (L) 12/02/2020   CHOLHDL 3.5 12/02/2020   VLDL 15 12/02/2020   LDLCALC 73 12/02/2020   LDLCALC 101 (H) 06/03/2019    Physical Findings: AIMS: Facial and Oral Movements Muscles of Facial Expression: None, normal Lips and Perioral Area: None, normal Jaw: None, normal Tongue: None, normal,Extremity Movements Upper (arms, wrists, hands, fingers): None, normal Lower (legs, knees, ankles, toes): None, normal, Trunk Movements Neck, shoulders, hips: None, normal, Overall Severity Severity of abnormal movements (highest score from questions above): None, normal Incapacitation due to abnormal movements: None, normal Patient's awareness of abnormal movements (rate only patient's report): No Awareness, Dental Status Current problems with teeth and/or dentures?: No Does patient usually wear dentures?: No  CIWA:    COWS:     Musculoskeletal: Strength & Muscle Tone: within normal limits Gait & Station: normal Patient leans: N/A  Psychiatric Specialty Exam:  Presentation  General Appearance: Appropriate for Environment  Eye Contact:Good; Other (comment) (Intermittent intense eye contact/staring)  Speech:Normal Rate (Mildly dysarthric)  Speech Volume:Normal  Handedness:Right   Mood and Affect  Mood:Anxious  Affect:Flat   Thought Process  Thought Processes:Coherent  Descriptions of Associations:Tangential  Orientation:Full (Time, Place and Person)  Thought Content:Delusions; Paranoid Ideation  History of Schizophrenia/Schizoaffective disorder:Yes  Duration of Psychotic Symptoms:Greater than six  months  Hallucinations:Hallucinations: Auditory  Ideas of Reference:Delusions; Paranoia  Suicidal Thoughts:Suicidal Thoughts: No  Homicidal Thoughts:Homicidal Thoughts: Yes, Passive HI Passive Intent and/or Plan: Without Intent   Sensorium  Memory:Immediate Fair; Recent Fair  Judgment:Impaired  Insight:Shallow   Executive Functions  Concentration:Fair  Attention Span:Fair  [place]   Psychomotor Activity  Psychomotor Activity:Psychomotor Activity: Decreased   Assets  Assets:Communication Skills; Housing; Social Support   Sleep  Sleep:Sleep: Fair Number of Hours of Sleep: 6.75    Physical Exam: Physical Exam Vitals and nursing note reviewed.  Constitutional:      General: He is not in acute distress.    Appearance: He is not diaphoretic.  HENT:     Head: Normocephalic and atraumatic.  Cardiovascular:     Rate and Rhythm: Normal rate.  Pulmonary:     Effort: Pulmonary effort is normal.  Neurological:     General: No focal deficit present.     Mental Status: He is alert and oriented to person, place, and time.   Review of Systems  Constitutional:  Negative for chills, diaphoresis and fever.  HENT:  Negative for sore throat.   Respiratory:  Negative for cough and shortness of breath.   Cardiovascular:  Negative for chest pain and palpitations.  Gastrointestinal:  Negative for constipation, diarrhea, nausea and vomiting.  Musculoskeletal:  Negative for myalgias.  Neurological:  Negative for dizziness, seizures and headaches.  Psychiatric/Behavioral:  Positive for hallucinations. Negative for depression, substance abuse and suicidal ideas. The patient is nervous/anxious. The patient does not have insomnia.   Blood pressure (!) 108/55, pulse 98, temperature 98.7 F (37.1 C), temperature source Oral, resp. rate 16, height 5' 6"  (1.676 m), weight 134.3 kg, SpO2 97 %. Body mass index is 47.78 kg/m.   Treatment  Plan Summary: Patient is a 25 year old male with schizoaffective disorder, bipolar type admitted for treatment and stabilization of worsening psychotic and mood symptoms, suicidal ideation and aggressive behavior.  Patient is displaying some improvement in organization of thought processes and is less exclusively focused on delusional themes.  He is calmer, sleeping better and less intrusive.  Overall he continues to display ongoing symptoms of disorganized thought processes, delusional thought content, and hallucinations.  Daily contact with patient to assess and evaluate symptoms and progress in treatment and Medication management  Schizoaffective disorder -Decrease Prolixin to 7.5 mg PO each morning and 7.5 mg PO each evening for psychotic symptoms due to EPS -Goal is for eventual transition to long-acting injectable antipsychotic (Prolixin Decanoate) -Continue Trileptal 300 mg twice daily for mood stabilization -Start Seroquel 100 mg at bedtime for psychotic symptoms and sleep  Anxiety/EPS -Continue propranolol 40 mg Q8H for restlessness and anxiety -Continue lorazepam 1 mg PO Q6H PRN anxiety, restlessness, agitation -Continue benztropine 1 mg PO twice daily to reduce EPS.  -Continue benztropine 0.5 mg PO Q6H PRN for tremors, muscle stiffness -Continue diphenhydramine 50 mg IM every 6 hours as needed acute dystonic reaction  Diabetes Mellitus -Continue metformin 500 mg twice daily with meals  Agitation  -Continue lorazepam 1 mg PO Q6H PRN anxiety, restlessness, agitation -Discontinue olanzapine 5 mg Q8H PRN agitation -Continue lorazepam 2 mg IM BID PRN for severe agitation only if patient unable to take oral PRN medications for agitation  Anxiety -Start hydroxyzine 25 mg Q6H PRN  Insomnia -Discontinue trazodone 100 mg QHS PRN due to lack of efficacy -Start Seroquel 100 mg at bedtime for psychotic symptoms and sleep  Discharge planning in progress  Arthor Captain, MD 12/12/2020,  3:31 PM

## 2020-12-13 DIAGNOSIS — F250 Schizoaffective disorder, bipolar type: Secondary | ICD-10-CM | POA: Diagnosis not present

## 2020-12-13 MED ORDER — QUETIAPINE FUMARATE 50 MG PO TABS
50.0000 mg | ORAL_TABLET | Freq: Every day | ORAL | Status: DC
  Administered 2020-12-13: 50 mg via ORAL
  Filled 2020-12-13 (×4): qty 1

## 2020-12-13 MED ORDER — BENZTROPINE MESYLATE 0.5 MG PO TABS
0.5000 mg | ORAL_TABLET | Freq: Two times a day (BID) | ORAL | Status: DC
Start: 2020-12-13 — End: 2020-12-14
  Administered 2020-12-13 – 2020-12-14 (×2): 0.5 mg via ORAL
  Filled 2020-12-13 (×7): qty 1

## 2020-12-13 MED ORDER — RISPERIDONE 2 MG PO TBDP
2.0000 mg | ORAL_TABLET | Freq: Two times a day (BID) | ORAL | Status: DC
  Administered 2020-12-13 – 2020-12-14 (×2): 2 mg via ORAL
  Filled 2020-12-13 (×8): qty 1

## 2020-12-13 NOTE — [facility] Group Notes (Signed)
[facility] Group Notes:  (Nursing/MHT/Case Management/Adjunct)  Date:  12/13/2020  Time:  4:42 PM  Type of Therapy:  Group Therapy  Participation Level:    Participation Quality:  Resistant  Affect:  Flat  Cognitive:  Confused  Insight:  Limited  Engagement in Group:  Poor  Modes of Intervention:  Discussion  Summary of Progress/Problems:  Noah Ramsey 12/13/2020, 4:42 PM

## 2020-12-13 NOTE — Progress Notes (Signed)
Recreation Therapy Notes  Date: 8.24.22 Time: 1000 Location: 500 Hall Dayroom                                                  Group Topic/Focus: Emotional Expression    Goal Area(s) Addresses:  Patient will be able to identify a variety of emotions.. Patient will successfully share benefits of healthy expression of emotions. Patient will share which emotion is most prominent within them.   Intervention: Therapist, art, Printed templates and colored pencils   Activity: Emotions in Color. Patient provided a simplistic emotions worksheet, displaying 16 blank squares with varying emotions labeled beside them (for example happiness, sadness, anxiety, love and pride). Patients were asked to pick their top 8 emotions.  Patient were then asked to identify a color they felt represented each emotion listed. Patient was then provided the 'Emotions Within Me' worksheet, that has a large blank heart on it. Patients were asked to fill in the heart shaped worksheet using the colors they identified on the emotions worksheet to represent the correlating feelings within them. Patients were given colored pencils to complete the assignment. Patients given the opportunity to share their completed assignment with each other. Patients were debriefed on the importance of having accurate words to described their emotions, recognizing what makes them feel a certain way so they can communicate effectively with others, and select appropriate coping skills.   Education: Tax inspector, Communication, Discharge planning   Education Outcome: Verbalizes understanding/In group clarification effective/Needs further education  Clinical Observations/Feedback: Pt did not attend group session.     Hatim Homann Lillia Abed LRT/CTRS         Caroll Rancher A 12/13/2020 1:41 PM

## 2020-12-13 NOTE — Progress Notes (Signed)
[facility] MD Progress Note  12/13/2020 3:54 PM Noah Ramsey  MRN:  [id number]  Reason for admission: Patient is a 25 year old male with history of schizoaffective disorder admitted for insomnia x 5 days, aggressive behavior, delusions, suicidal ideation and auditory hallucinations.  Objective: Medical record reviewed.  Patient's case discussed in detail with members of the treatment team.  I met with and evaluated the patient on the unit today for follow-up today.  Patient looks the same as yesterday and does not appear to have improved much despite 1 week of treatment on Prolixin with doses up to 20 mg total per day.  On interview with me today he remains tangential, loose, grandiose, paranoid, and delusional.  Patient is intrusive and needs to be redirected several times during our conversation today due to multiple personal questions that he asks me.  He denies any physical problems or medication side effects.  Patient denies SI, AI, HI.  He reports that he is eating and sleeping well.  Staff document that he slept 5.25 hours last night.  Staff report that patient has been intermittently whispering or talking loudly to himself in response to internal stimuli.    I made direct phone contact today with patient's outpatient psychiatric team at [facility].  Confirmed that patient has a history of doing poorly on aripiprazole with increased restlessness and hypersexual behavior and that he did well on long-acting injectable haloperidol in the past but declined to continue to take it.  Patient most recently was being prescribed Latuda but was poorly adherent to taking oral medications.  Outpatient mental health team reported no history of prior treatment with other antipsychotic medications, including no prior trials of Prolixin/fluphenazine, paliperidone/Invega or risperidone Risperdal.  I was unable to obtain from outpatient team what patient's baseline level of symptoms are when he is optimally  treated.  Principal Problem: Schizoaffective disorder ([place]) Diagnosis: Principal Problem:   Schizoaffective disorder ([place])  Total Time spent with patient:  25 minutes  Past Psychiatric History: See admission H&P  Past Medical History:  Past Medical History:  Diagnosis Date   Elevated CPK    per patient   Schizophrenia ([place])    History reviewed. No pertinent surgical history. Family History:  Family History  Problem Relation Age of Onset   Mental illness Brother    Family Psychiatric  History: See admission H&P Social History:  Social History   Substance and Sexual Activity  Alcohol Use Never     Social History   Substance and Sexual Activity  Drug Use Never    Social History   Socioeconomic History   Marital status: Single    Spouse name: Not on file   Number of children: Not on file   Years of education: Not on file   Highest education level: Not on file  Occupational History   Not on file  Tobacco Use   Smoking status: Never   Smokeless tobacco: Never  Substance and Sexual Activity   Alcohol use: Never   Drug use: Never   Sexual activity: Not on file  Other Topics Concern   Not on file  Social History Narrative   Not on file   Social Determinants of Health   Financial Resource Strain: Not on file  Food Insecurity: Not on file  Transportation Needs: Not on file  Physical Activity: Not on file  Stress: Not on file  Social Connections: Not on file   Additional Social History:  Sleep: Fair  Appetite:  Good  Current Medications: Current Facility-Administered Medications  Medication Dose Route Frequency Provider Last Rate Last Admin   acetaminophen (TYLENOL) tablet 650 mg  650 mg Oral Q6H PRN Rankin, Shuvon B, NP   650 mg at 12/13/20 0549   alum & mag hydroxide-simeth (MAALOX/MYLANTA) 200-200-20 MG/5ML suspension 30 mL  30 mL Oral Q4H PRN Rankin, Shuvon B, NP       benztropine (COGENTIN) tablet 0.5 mg  0.5 mg  Oral Q6H PRN Arthor Captain, MD       benztropine (COGENTIN) tablet 1 mg  1 mg Oral BID Arthor Captain, MD   1 mg at 12/13/20 6962   diphenhydrAMINE (BENADRYL) injection 50 mg  50 mg Intramuscular Q6H PRN Arthor Captain, MD       fluPHENAZine (PROLIXIN) tablet 7.5 mg  7.5 mg Oral Daily Arthor Captain, MD   7.5 mg at 12/13/20 9528   fluPHENAZine (PROLIXIN) tablet 7.5 mg  7.5 mg Oral QPM Arthor Captain, MD   7.5 mg at 12/12/20 1830   hydrOXYzine (ATARAX/VISTARIL) tablet 25 mg  25 mg Oral Q6H PRN Arthor Captain, MD       LORazepam (ATIVAN) injection 2 mg  2 mg Intramuscular BID PRN Arthor Captain, MD       LORazepam (ATIVAN) tablet 1 mg  1 mg Oral Q6H PRN Arthor Captain, MD       magnesium hydroxide (MILK OF MAGNESIA) suspension 30 mL  30 mL Oral Daily PRN Rankin, Shuvon B, NP       metFORMIN (GLUCOPHAGE) tablet 500 mg  500 mg Oral BID WC Rankin, Shuvon B, NP   500 mg at 12/13/20 4132   Oxcarbazepine (TRILEPTAL) tablet 300 mg  300 mg Oral BID Rankin, Shuvon B, NP   300 mg at 12/13/20 0813   propranolol (INDERAL) tablet 40 mg  40 mg Oral Q8H Arthor Captain, MD   40 mg at 12/12/20 2113   QUEtiapine (SEROQUEL) tablet 100 mg  100 mg Oral QHS Arthor Captain, MD   100 mg at 12/12/20 2113    Lab Results:  Results for orders placed or performed during the hospital encounter of 12/03/20 (from the past 48 hour(s))  Urinalysis, Complete w Microscopic Urine, Clean Catch     Status: None   Collection Time: 12/11/20  6:17 PM  Result Value Ref Range   Color, Urine YELLOW YELLOW   APPearance CLEAR CLEAR   Specific Gravity, Urine 1.018 1.005 - 1.030   pH 7.0 5.0 - 8.0   Glucose, UA NEGATIVE NEGATIVE mg/dL   Hgb urine dipstick NEGATIVE NEGATIVE   Bilirubin Urine NEGATIVE NEGATIVE   Ketones, ur NEGATIVE NEGATIVE mg/dL   Protein, ur NEGATIVE NEGATIVE mg/dL   Nitrite NEGATIVE NEGATIVE   Leukocytes,Ua NEGATIVE NEGATIVE   RBC / HPF 0-5 0 - 5 RBC/hpf   WBC, UA 0-5 0 - 5 WBC/hpf   Bacteria, UA NONE  SEEN NONE SEEN    Comment: Performed at [facility], [facility] [address] [place], [place] [address]     Blood Alcohol level:  Lab Results  Component Value Date   [facility] <10 11/29/2020   ETH <10 27/25/3664    Metabolic Disorder Labs: Lab Results  Component Value Date   HGBA1C 5.5 12/04/2020   MPG 111.15 12/04/2020   No results found for: PROLACTIN Lab Results  Component Value Date   CHOL 123 12/02/2020   TRIG 74 12/02/2020  HDL 35 (L) 12/02/2020   CHOLHDL 3.5 12/02/2020   VLDL 15 12/02/2020   LDLCALC 73 12/02/2020   LDLCALC 101 (H) 06/03/2019    Physical Findings: AIMS: Facial and Oral Movements Muscles of Facial Expression: None, normal Lips and Perioral Area: None, normal Jaw: None, normal Tongue: Mild,Extremity Movements Upper (arms, wrists, hands, fingers): None, normal Lower (legs, knees, ankles, toes): None, normal, Trunk Movements Neck, shoulders, hips: None, normal, Overall Severity Severity of abnormal movements (highest score from questions above): None, normal Incapacitation due to abnormal movements: None, normal Patient's awareness of abnormal movements (rate only patient's report): No Awareness, Dental Status Current problems with teeth and/or dentures?: No Does patient usually wear dentures?: No  CIWA:    COWS:     Musculoskeletal: Strength & Muscle Tone: within normal limits Gait & Station: normal Patient leans: N/A  Psychiatric Specialty Exam:  Presentation  General Appearance: Appropriate for Environment  Eye Contact:Good; Other (comment) (Intermittent intense eye contact/staring)  Speech:Normal Rate (Mildly dysarthric)  Speech Volume:Normal  Handedness:Right   Mood and Affect  Mood:Anxious  Affect:Flat   Thought Process  Thought Processes:Coherent  Descriptions of Associations:Tangential  Orientation:Full (Time, Place and Person)  Thought Content:Delusions; Paranoid Ideation  History of  Schizophrenia/Schizoaffective disorder:Yes  Duration of Psychotic Symptoms:Greater than six months  Hallucinations:Hallucinations: Auditory  Ideas of Reference:Delusions; Paranoia  Suicidal Thoughts:Suicidal Thoughts: No  Homicidal Thoughts:Homicidal Thoughts: Yes, Passive HI Passive Intent and/or Plan: Without Intent   Sensorium  Memory:Immediate Fair; Recent Fair  Judgment:Impaired  Insight:Shallow   Executive Functions  Concentration:Fair  Attention Span:Fair  [place]   Psychomotor Activity  Psychomotor Activity:Psychomotor Activity: Decreased   Assets  Assets:Communication Skills; Housing; Social Support   Sleep  Sleep:Sleep: Fair Number of Hours of Sleep: 5.25    Physical Exam: Physical Exam Vitals and nursing note reviewed.  Constitutional:      General: He is not in acute distress.    Appearance: He is not diaphoretic.  HENT:     Head: Normocephalic and atraumatic.  Cardiovascular:     Rate and Rhythm: Normal rate.  Pulmonary:     Effort: Pulmonary effort is normal.  Neurological:     General: No focal deficit present.     Mental Status: He is alert and oriented to person, place, and time.   Review of Systems  Constitutional:  Negative for chills, diaphoresis and fever.  HENT:  Negative for sore throat.   Respiratory:  Negative for cough and shortness of breath.   Cardiovascular:  Negative for chest pain and palpitations.  Gastrointestinal:  Negative for constipation, diarrhea, nausea and vomiting.  Musculoskeletal:  Negative for myalgias.  Neurological:  Negative for dizziness, seizures and headaches.  Psychiatric/Behavioral:  Positive for hallucinations. Negative for depression, substance abuse and suicidal ideas. The patient is nervous/anxious. The patient does not have insomnia.   Blood pressure 118/65, pulse 88, temperature 98.1 F (36.7 C), temperature source Oral, resp. rate 16, height  _0  (1.676 m), weight 134.3 kg, SpO2 99 %. Body mass index is 47.78 kg/m.   Treatment Plan Summary: Patient is a 25 year old male with schizoaffective disorder, bipolar type admitted for treatment and stabilization of worsening psychotic and mood symptoms, suicidal ideation and aggressive behavior.  Patient is displaying some improvement in organization of thought processes and is less exclusively focused on delusional themes.  He is calmer, sleeping better and less intrusive.  Overall he continues to display ongoing symptoms of disorganized thought processes, delusional thought content  and hallucinations and does not appear to have improved much on Prolixin.  Daily contact with patient to assess and evaluate symptoms and progress in treatment and Medication management  Schizoaffective disorder -Discontinue Prolixin 7.5 mg PO twice daily due to lack of efficacy -Start Risperdal M tabs 2 mg PO twice daily for psychotic symptoms -Goal is for eventual transition to long-acting injectable antipsychotic  -Continue Trileptal 300 mg twice daily for mood stabilization -Decrease Seroquel to 50 mg at bedtime   Anxiety/EPS -Continue propranolol 40 mg Q8H for restlessness and anxiety -Decrease benztropine to 0.5 mg PO twice daily   -Continue benztropine 0.5 mg PO Q6H PRN for tremors, muscle stiffness -Continue diphenhydramine 50 mg IM every 6 hours as needed for acute dystonic reaction  Diabetes Mellitus -Continue metformin 500 mg twice daily with meals  Agitation  -Continue lorazepam 1 mg PO Q6H PRN anxiety, restlessness, agitation -Continue lorazepam 2 mg IM BID PRN for severe agitation only if patient unable to take oral PRN medications for agitation  Anxiety -Continue hydroxyzine 25 mg Q6H PRN  Insomnia -Decrease Seroquel to 50 mg at bedtime   Discharge planning in progress  Arthor Captain, MD 12/13/2020, 3:54 PM

## 2020-12-13 NOTE — [facility] Group Notes (Addendum)
[facility] LCSW Group Therapy   12/13/2020 at 1:00pm   Type of Therapy:  Group Therapy: Value Clarification      Participation Level: Active   Description of Group:  In this group, patients will learn to explore, identify, and clarify the values that influence their decisions and behavior and encourages them to build on their inner resources and strengths. This group will be clarifying, exporational and eductional, with patients participating in identifying their own values, identifying others values, and identifying societal values and their impact on their lives.      Therapeutic Goals: Patient will learn to identify values. Patient will learn to identify others values. Patient will learn ways to use their values in decisions they make in their daily lives. Patient will receive support and feedback from others     Therapeutic Modalities:  Acceptance and Commitment Therapy      Summary of Progress/Problems: Pt attended and participated in group. Pt often asked innapropriate questions and was often off topic. Pt reports that self-awareness is a strength and value of his.

## 2020-12-13 NOTE — [facility] Group Notes (Signed)
[facility] Group Notes:  (Nursing/MHT/Case Management/Adjunct)  Date:  12/13/2020  Time:  8:00 PM  Type of Therapy:  Group Therapy  Participation Level:  Did Not Attend  Participation Quality:   NA  Affect:   NA  Cognitive:   NA  Insight:  None  Engagement in Group:   NA  Modes of Intervention:   NA  Summary of Progress/Problems:  Lorita Officer 12/13/2020, 8:00 PM

## 2020-12-13 NOTE — Tx Team (Signed)
Interdisciplinary Treatment and Diagnostic Plan Update  12/13/2020 Time of Session: 9:55am Noah Ramsey MRN: [id number]  Principal Diagnosis: Schizoaffective disorder [facility])  Secondary Diagnoses: Principal Problem:   Schizoaffective disorder (HCC)   Current Medications:  Current Facility-Administered Medications  Medication Dose Route Frequency Provider Last Rate Last Admin   acetaminophen (TYLENOL) tablet 650 mg  650 mg Oral Q6H PRN Rankin, Shuvon B, NP   650 mg at 12/13/20 0549   alum & mag hydroxide-simeth (MAALOX/MYLANTA) 200-200-20 MG/5ML suspension 30 mL  30 mL Oral Q4H PRN Rankin, Shuvon B, NP       benztropine (COGENTIN) tablet 0.5 mg  0.5 mg Oral Q6H PRN Claudie Revering, MD       benztropine (COGENTIN) tablet 1 mg  1 mg Oral BID Claudie Revering, MD   1 mg at 12/13/20 8563   diphenhydrAMINE (BENADRYL) injection 50 mg  50 mg Intramuscular Q6H PRN Claudie Revering, MD       fluPHENAZine (PROLIXIN) tablet 7.5 mg  7.5 mg Oral Daily Claudie Revering, MD   7.5 mg at 12/13/20 1497   fluPHENAZine (PROLIXIN) tablet 7.5 mg  7.5 mg Oral QPM Claudie Revering, MD   7.5 mg at 12/12/20 1830   hydrOXYzine (ATARAX/VISTARIL) tablet 25 mg  25 mg Oral Q6H PRN Claudie Revering, MD       LORazepam (ATIVAN) injection 2 mg  2 mg Intramuscular BID PRN Claudie Revering, MD       LORazepam (ATIVAN) tablet 1 mg  1 mg Oral Q6H PRN Claudie Revering, MD       magnesium hydroxide (MILK OF MAGNESIA) suspension 30 mL  30 mL Oral Daily PRN Rankin, Shuvon B, NP       metFORMIN (GLUCOPHAGE) tablet 500 mg  500 mg Oral BID WC Rankin, Shuvon B, NP   500 mg at 12/13/20 0263   Oxcarbazepine (TRILEPTAL) tablet 300 mg  300 mg Oral BID Rankin, Shuvon B, NP   300 mg at 12/13/20 0813   propranolol (INDERAL) tablet 40 mg  40 mg Oral Q8H Claudie Revering, MD   40 mg at 12/12/20 2113   QUEtiapine (SEROQUEL) tablet 100 mg  100 mg Oral QHS Claudie Revering, MD   100 mg at 12/12/20 2113   PTA Medications: Medications Prior to  Admission  Medication Sig Dispense Refill Last Dose   diazepam (VALIUM) 5 MG tablet Take 5 mg by mouth 2 (two) times daily as needed for anxiety.      haloperidol (HALDOL) 10 MG tablet Take 10 mg by mouth at bedtime. (Patient not taking: No sig reported)      haloperidol decanoate (HALDOL DECANOATE) 100 MG/ML injection Inject 100 mg into the muscle every 28 (twenty-eight) days. (Patient not taking: No sig reported)      LATUDA 40 MG TABS tablet Take 40 mg by mouth at bedtime.      metFORMIN (GLUCOPHAGE) 500 MG tablet Take 1 tablet (500 mg total) by mouth 2 (two) times daily with a meal. (Patient not taking: No sig reported) 180 tablet 3    OVER THE COUNTER MEDICATION Ripped Fat Burner (diet pill) (Patient not taking: Reported on 12/06/2020)   Not Taking   OVER THE COUNTER MEDICATION Yohimbine (diet pill) (Patient not taking: Reported on 12/06/2020)   Not Taking   propranolol (INDERAL) 40 MG tablet Take 40 mg by mouth 3 (three) times daily.       Patient Stressors: Financial difficulties Medication change or noncompliance  Patient Strengths: Manufacturing systems engineer Supportive family/friends  Treatment Modalities: Medication Management, Group therapy, Case management,  1 to 1 session with clinician, Psychoeducation, Recreational therapy.   Physician Treatment Plan for Primary Diagnosis: Schizoaffective disorder (HCC) Long Term Goal(s): Improvement in symptoms so as ready for discharge   Short Term Goals: Ability to identify changes in lifestyle to reduce recurrence of condition will improve Ability to verbalize feelings will improve Ability to disclose and discuss suicidal ideas Ability to demonstrate self-control will improve Ability to identify and develop effective coping behaviors will improve Ability to maintain clinical measurements within normal limits will improve Compliance with prescribed medications will improve  Medication Management: Evaluate patient's response, side effects,  and tolerance of medication regimen.  Therapeutic Interventions: 1 to 1 sessions, Unit Group sessions and Medication administration.  Evaluation of Outcomes: Progressing  Physician Treatment Plan for Secondary Diagnosis: Principal Problem:   Schizoaffective disorder (HCC)  Long Term Goal(s): Improvement in symptoms so as ready for discharge   Short Term Goals: Ability to identify changes in lifestyle to reduce recurrence of condition will improve Ability to verbalize feelings will improve Ability to disclose and discuss suicidal ideas Ability to demonstrate self-control will improve Ability to identify and develop effective coping behaviors will improve Ability to maintain clinical measurements within normal limits will improve Compliance with prescribed medications will improve     Medication Management: Evaluate patient's response, side effects, and tolerance of medication regimen.  Therapeutic Interventions: 1 to 1 sessions, Unit Group sessions and Medication administration.  Evaluation of Outcomes: Progressing   RN Treatment Plan for Primary Diagnosis: Schizoaffective disorder (HCC) Long Term Goal(s): Knowledge of disease and therapeutic regimen to maintain health will improve  Short Term Goals: Ability to remain free from injury will improve, Ability to verbalize frustration and anger appropriately will improve, Ability to demonstrate self-control, Ability to identify and develop effective coping behaviors will improve, and Compliance with prescribed medications will improve  Medication Management: RN will administer medications as ordered by provider, will assess and evaluate patient's response and provide education to patient for prescribed medication. RN will report any adverse and/or side effects to prescribing provider.  Therapeutic Interventions: 1 on 1 counseling sessions, Psychoeducation, Medication administration, Evaluate responses to treatment, Monitor vital signs and  CBGs as ordered, Perform/monitor CIWA, COWS, AIMS and Fall Risk screenings as ordered, Perform wound care treatments as ordered.  Evaluation of Outcomes: Progressing   LCSW Treatment Plan for Primary Diagnosis: Schizoaffective disorder (HCC) Long Term Goal(s): Safe transition to appropriate next level of care at discharge, Engage patient in therapeutic group addressing interpersonal concerns.  Short Term Goals: Engage patient in aftercare planning with referrals and resources, Increase social support, Increase ability to appropriately verbalize feelings, Increase emotional regulation, Identify triggers associated with mental health/substance abuse issues, and Increase skills for wellness and recovery  Therapeutic Interventions: Assess for all discharge needs, 1 to 1 time with Social worker, Explore available resources and support systems, Assess for adequacy in community support network, Educate family and significant other(s) on suicide prevention, Complete Psychosocial Assessment, Interpersonal group therapy.  Evaluation of Outcomes: Progressing   Progress in Treatment: Attending groups: Yes. Participating in groups: Yes. Taking medication as prescribed: Yes. Toleration medication: Yes. Family/Significant other contact made: Yes, individual(s) contacted:  mother Patient understands diagnosis: Yes. Discussing patient identified problems/goals with staff: Yes. Medical problems stabilized or resolved: Yes. Denies suicidal/homicidal ideation: Yes. Issues/concerns per patient self-inventory: No.   New problem(s) identified: No, Describe:  none  New Short Term/Long Term  Goal(s): medication stabilization, elimination of SI thoughts, development of comprehensive mental wellness plan.    Patient Goals:  Did not attend  Discharge Plan or Barriers: Pt will live with his mother at discharge. Pt will f/u at [facility] for medication management and therapy. Pt has been referred for ACTT services.    Reason for Continuation of Hospitalization: Aggression Delusions  Hallucinations Medication stabilization  Estimated Length of Stay: 3-5 days  Attendees: Patient: Did not attend 12/13/2020   Physician: Rhea Belton, DO 12/13/2020   Nursing:  12/13/2020   RN Care Manager: 12/13/2020   Social Worker: Melba Coon, LCSWA 12/13/2020  Recreational Therapist:  12/13/2020   Other:  12/13/2020   Other:  12/13/2020   Other: 12/13/2020     Scribe for Treatment Team: Aram Beecham, LCSWA 12/13/2020 2:15 PM

## 2020-12-13 NOTE — Progress Notes (Signed)
Pt A & O to self and place. Denies SI, HI, AVH and pain. However, pt is preoccupied, observed whispering at medication window on initial contact but is pleasant. Pt noted to be louder this afternoon, talking to self in hall and in cafeteria as well requiring increased verbal prompts from MHT staff. Presents animated but anxious. Pt remains medication compliant. Denies adverse drug reactions when assessed. Tolerates fluids and meals well. Emotional support offered to pt this shift. All medications given as ordered with verbal education and effects monitored. Q 15 minutes safety checks maintained without self harm gestures or outburst. Writer encouraged pt to voice concerns. Pt showered this shift with staff assistance. Went off unit with peers for meals and recreation time without issues. Denies concerns at this time.

## 2020-12-13 NOTE — Progress Notes (Signed)
Pt did not attend orientation/goals group. 

## 2020-12-14 DIAGNOSIS — F250 Schizoaffective disorder, bipolar type: Secondary | ICD-10-CM | POA: Diagnosis not present

## 2020-12-14 MED ORDER — QUETIAPINE FUMARATE 50 MG PO TABS: 50.0000 mg | ORAL_TABLET | Freq: Every evening | ORAL | Status: DC | PRN

## 2020-12-14 MED ORDER — RISPERIDONE 2 MG PO TBDP
2.0000 mg | ORAL_TABLET | Freq: Two times a day (BID) | ORAL | Status: DC
  Administered 2020-12-14 – 2020-12-15 (×2): 2 mg via ORAL
  Filled 2020-12-14 (×6): qty 1

## 2020-12-14 MED ORDER — BENZTROPINE MESYLATE 0.5 MG PO TABS
0.5000 mg | ORAL_TABLET | Freq: Two times a day (BID) | ORAL | Status: DC
Start: 2020-12-14 — End: 2020-12-19
  Administered 2020-12-14 – 2020-12-19 (×10): 0.5 mg via ORAL
  Filled 2020-12-14 (×14): qty 1

## 2020-12-14 NOTE — Progress Notes (Signed)
[facility] MD Progress Note  12/14/2020 4:55 PM Noah Ramsey  MRN:  [id number]  Reason for admission: Patient is a 25 year old male with history of schizoaffective disorder admitted for insomnia x 5 days, aggressive behavior, delusions, suicidal ideation and auditory hallucinations.  Objective: Medical record reviewed.  Patient's case discussed in detail with members of the treatment team.  I met with and evaluated the patient on the unit today for follow-up today.  Patient appears more relaxed and speech is much less dysarthric today.  He continues to speak of grandiose and paranoid delusional topics.  Patient talks about being "the [place]" and having multiple powers.  He reports AH of demons and tells me he that he knows that I am "Lilith" a demon.  He states that his stepbrother and stepfather were harassing him prior to admission and verbalizes desire to move into his own apartment.  We discussed that he will not be able to discharge from the hospital directly to his own apartment.  Patient denies thoughts of harming himself or others.  He states that he needs to take multiple supplements to help him build muscle and lose weight.  I advised him that he should not take any supplements or purchase any supplements online as this could be dangerous to his physical and mental health.  I advised him that he should only take medications as prescribed by his outpatient doctor.  Patient stated limited understanding of the information discussed.  He denies muscle spasms, tongue thickness, restlessness, trouble swallowing, trouble breathing, lightheadedness, dizziness, chest pain.  He reports that he is eating and sleeping well.  Staff document that he slept 4.75 hours last night.  Vital signs include BP of 108/74, pulse of 100, respirations 18, O2 sat of 100% and temperature of 97.7.  No new labs today.  Staff document that patient infrequently attends groups and is poorly engaged when present for groups.  Staff  report that patient has appeared more restless.  Review of MAR indicates that staff have been holding scheduled propranolol for the past 48 hours despite normal heart rate.  Patient has been taking other scheduled medications as prescribed.  He received lorazepam 1 mg x1 at 2AM this morning but has not received other PRN medications.  Principal Problem: Schizoaffective disorder ([place]) Diagnosis: Principal Problem:   Schizoaffective disorder ([place])  Total Time spent with patient:  25 minutes  Past Psychiatric History: See admission H&P  Past Medical History:  Past Medical History:  Diagnosis Date   Elevated CPK    per patient   Schizophrenia ([place])    History reviewed. No pertinent surgical history. Family History:  Family History  Problem Relation Age of Onset   Mental illness Brother    Family Psychiatric  History: See admission H&P Social History:  Social History   Substance and Sexual Activity  Alcohol Use Never     Social History   Substance and Sexual Activity  Drug Use Never    Social History   Socioeconomic History   Marital status: Single    Spouse name: Not on file   Number of children: Not on file   Years of education: Not on file   Highest education level: Not on file  Occupational History   Not on file  Tobacco Use   Smoking status: Never   Smokeless tobacco: Never  Substance and Sexual Activity   Alcohol use: Never   Drug use: Never   Sexual activity: Not on file  Other Topics Concern   Not  on file  Social History Narrative   Not on file   Social Determinants of Health   Financial Resource Strain: Not on file  Food Insecurity: Not on file  Transportation Needs: Not on file  Physical Activity: Not on file  Stress: Not on file  Social Connections: Not on file   Additional Social History:                         Sleep: Fair  Appetite:  Good  Current Medications: Current Facility-Administered Medications  Medication Dose Route  Frequency Provider Last Rate Last Admin   acetaminophen (TYLENOL) tablet 650 mg  650 mg Oral Q6H PRN Rankin, Shuvon B, NP   650 mg at 12/14/20 0203   alum & mag hydroxide-simeth (MAALOX/MYLANTA) 200-200-20 MG/5ML suspension 30 mL  30 mL Oral Q4H PRN Rankin, Shuvon B, NP       benztropine (COGENTIN) tablet 0.5 mg  0.5 mg Oral Q6H PRN Arthor Captain, MD       benztropine (COGENTIN) tablet 0.5 mg  0.5 mg Oral BID Arthor Captain, MD       diphenhydrAMINE (BENADRYL) injection 50 mg  50 mg Intramuscular Q6H PRN Arthor Captain, MD       hydrOXYzine (ATARAX/VISTARIL) tablet 25 mg  25 mg Oral Q6H PRN Arthor Captain, MD       LORazepam (ATIVAN) injection 2 mg  2 mg Intramuscular BID PRN Arthor Captain, MD       LORazepam (ATIVAN) tablet 1 mg  1 mg Oral Q6H PRN Arthor Captain, MD   1 mg at 12/14/20 0203   magnesium hydroxide (MILK OF MAGNESIA) suspension 30 mL  30 mL Oral Daily PRN Rankin, Shuvon B, NP       metFORMIN (GLUCOPHAGE) tablet 500 mg  500 mg Oral BID WC Rankin, Shuvon B, NP   500 mg at 12/14/20 0755   Oxcarbazepine (TRILEPTAL) tablet 300 mg  300 mg Oral BID Rankin, Shuvon B, NP   300 mg at 12/14/20 0756   propranolol (INDERAL) tablet 40 mg  40 mg Oral Q8H Arthor Captain, MD   40 mg at 12/13/20 1427   QUEtiapine (SEROQUEL) tablet 50 mg  50 mg Oral QHS PRN Arthor Captain, MD       risperiDONE (RISPERDAL M-TABS) disintegrating tablet 2 mg  2 mg Oral BID Arthor Captain, MD        Lab Results:  No results found for this or any previous visit (from the past 48 hour(s)).    Blood Alcohol level:  Lab Results  Component Value Date   ETH <10 11/29/2020   ETH <10 04/59/9774    Metabolic Disorder Labs: Lab Results  Component Value Date   HGBA1C 5.5 12/04/2020   MPG 111.15 12/04/2020   No results found for: PROLACTIN Lab Results  Component Value Date   CHOL 123 12/02/2020   TRIG 74 12/02/2020   HDL 35 (L) 12/02/2020   CHOLHDL 3.5 12/02/2020   VLDL 15 12/02/2020   LDLCALC 73  12/02/2020   LDLCALC 101 (H) 06/03/2019    Physical Findings: AIMS: Facial and Oral Movements Muscles of Facial Expression: None, normal Lips and Perioral Area: None, normal Jaw: None, normal Tongue: Mild,Extremity Movements Upper (arms, wrists, hands, fingers): None, normal Lower (legs, knees, ankles, toes): None, normal, Trunk Movements Neck, shoulders, hips: None, normal, Overall Severity Severity of abnormal movements (highest score from questions above): None, normal Incapacitation  due to abnormal movements: None, normal Patient's awareness of abnormal movements (rate only patient's report): No Awareness, Dental Status Current problems with teeth and/or dentures?: No Does patient usually wear dentures?: No  CIWA:    COWS:     Musculoskeletal: Strength & Muscle Tone: within normal limits Gait & Station: normal Patient leans: N/A  Psychiatric Specialty Exam:  Presentation  General Appearance: Appropriate for Environment  Eye Contact:Good (Less frequent staring)  Speech:Normal Rate  Speech Volume:Normal  Handedness:Right   Mood and Affect  Mood:Anxious  Affect:Flat   Thought Process  Thought Processes:Coherent  Descriptions of Associations:Loose  Orientation:Full (Time, Place and Person)  Thought Content:Delusions; Paranoid Ideation  History of Schizophrenia/Schizoaffective disorder:Yes  Duration of Psychotic Symptoms:Greater than six months  Hallucinations:Hallucinations: Auditory  Ideas of Reference:Delusions; Paranoia  Suicidal Thoughts:Suicidal Thoughts: No  Homicidal Thoughts:Homicidal Thoughts: Yes, Passive HI Passive Intent and/or Plan: Without Intent; Without Plan   Sensorium  Memory:Immediate Fair; Recent Fair  Judgment:Impaired  Insight:Shallow   Executive Functions  Concentration:Fair  Attention Span:Fair  [place]   Psychomotor Activity  Psychomotor Activity:Psychomotor  Activity: Normal   Assets  Assets:Communication Skills; Housing; Social Support   Sleep  Sleep:Sleep: Fair Number of Hours of Sleep: 4.75    Physical Exam: Physical Exam Vitals and nursing note reviewed.  Constitutional:      General: He is not in acute distress.    Appearance: He is not diaphoretic.  HENT:     Head: Normocephalic and atraumatic.  Cardiovascular:     Rate and Rhythm: Normal rate.  Pulmonary:     Effort: Pulmonary effort is normal.  Neurological:     General: No focal deficit present.     Mental Status: He is alert and oriented to person, place, and time.   Review of Systems  Constitutional:  Negative for chills, diaphoresis and fever.  HENT:  Negative for sore throat.   Respiratory:  Negative for cough and shortness of breath.   Cardiovascular:  Negative for chest pain and palpitations.  Gastrointestinal:  Negative for constipation, diarrhea, nausea and vomiting.  Musculoskeletal:  Negative for myalgias.  Neurological:  Negative for dizziness, seizures and headaches.  Psychiatric/Behavioral:  Positive for hallucinations. Negative for depression, substance abuse and suicidal ideas. The patient is nervous/anxious. The patient does not have insomnia.   Blood pressure 108/74, pulse 100, temperature 97.7 F (36.5 C), temperature source Oral, resp. rate 18, height _0  (1.676 m), weight 134.3 kg, SpO2 100 %. Body mass index is 47.78 kg/m.   Treatment Plan Summary: Patient is a 25 year old male with schizoaffective disorder, bipolar type admitted for treatment and stabilization of worsening psychotic and mood symptoms, suicidal ideation and aggressive behavior.  Patient is displaying some improvement in organization of thought processes and is less exclusively focused on delusional themes.  He is calmer, sleeping better and less intrusive.  Overall he continues to display ongoing symptoms of disorganized thought processes, delusional thought content and  hallucinations.  He is tolerating discontinuation of Prolixin and initiation of risperidone.  Daily contact with patient to assess and evaluate symptoms and progress in treatment and Medication management  Schizoaffective disorder -Continue Risperdal M tabs 2 mg PO twice daily for psychotic symptoms -Goal is for eventual transition to long-acting injectable antipsychotic  -Continue Trileptal 300 mg twice daily for mood stabilization -Continue Seroquel 50 mg at bedtime as needed  Anxiety/EPS -Continue propranolol 40 mg Q8H for restlessness and anxiety -Continue benztropine 0.5 mg PO twice daily   -  Continue benztropine 0.5 mg PO Q6H PRN for tremors, muscle stiffness -Continue diphenhydramine 50 mg IM every 6 hours as needed for acute dystonic reaction  Diabetes Mellitus -Continue metformin 500 mg twice daily with meals  Agitation  -Continue lorazepam 1 mg PO Q6H PRN anxiety, restlessness, agitation -Continue lorazepam 2 mg IM BID PRN for severe agitation only if patient unable to take oral PRN medications for agitation  Anxiety -Continue hydroxyzine 25 mg Q6H PRN  Insomnia -Continue Seroquel 50 mg at bedtime as needed  Discharge planning in progress  Arthor Captain, MD 12/14/2020, 4:55 PM

## 2020-12-14 NOTE — [facility] Group Notes (Signed)
Occupational Therapy Group Note Date: 12/14/2020 Group Topic/Focus: Coping Skills  Group Description: Group Description: Group encouraged increased engagement and participation through discussion focused on coping through music. Group members were encouraged to create a "coping skills playlist" that consisted of 20 songs with different cited categories/topics including "a song that reminds you of a good memory" and "a song that makes you want to dance", etc. Discussion followed with patients sharing their playlists and choosing one for the group to listen and respond too.   Therapeutic Goal(s): Identify positive songs to improve coping through music Identify and share benefit of music as a coping strategy  Participation Level: Active   Participation Quality: Independent   Behavior: Calm and Cooperative   Speech/Thought Process: Disorganized   Affect/Mood: Euthymic   Insight: Limited   Judgement: Limited   Individualization: Noah Ramsey was active in their participation of group discussion/activity. Pt identified "[name]" and "[name]" as some of his favorite artists and said music helps him "to vibe in the moment". He also shared it helps hype him up at the gym. Appeared mostly receptive to education on use of music as a coping mechanism.   Modes of Intervention: Activity, Discussion, and Education  Patient Response to Interventions:  Attentive and Engaged   Plan: Continue to engage patient in OT groups 2 - 3x/week.  12/14/2020  Donne Hazel, MOT, OTR/L

## 2020-12-14 NOTE — Progress Notes (Signed)
Recreation Therapy Notes  Date: 8.25.22 Time: 0945 Location: [address] Dayroom  Group Topic: Triggers  Goal Area(s) Addresses:  Patient will identify what a trigger is. Patient will identify what triggers them. Patient will identify ways to deal with triggers.  Behavioral Response: Unengaged  Intervention: Worksheet, Music  Activity: Triggers.  Patients were given a worksheet in which they were to identify some of the the reactions their triggers cause.  Patients also had to identify people, places, emotions, thoughts, etc that can be a trigger.  Lastly, patients were to identify their three biggest triggers, ways to avoid those triggers and how to deal with them head on.  LRT played music in the background for patients to listen as they worked.  Education: Pharmacologist, Building control surveyor.   Education Outcome: Acknowledges understanding/In group clarification offered/Needs additional education.   Clinical Observations/Feedback: Pt attended group but did not complete worksheet.  Pt instead listened intently to the music as it played.     Caroll Rancher, LRT/CTRS         Caroll Rancher A 12/14/2020 11:36 AM

## 2020-12-14 NOTE — [facility] Group Notes (Signed)
[facility] Group Notes:  (Nursing/MHT/Case Management/Adjunct)  Date:  12/14/2020  Time:  8:07 PM  Type of Therapy:  Group Therapy  Participation Level:  Minimal  Participation Quality:  Attentive  Affect:  Appropriate  Cognitive:  Appropriate  Insight:  Improving  Engagement in Group:  Developing/Improving  Modes of Intervention:  Discussion  Summary of Progress/Problems:  Lorita Officer 12/14/2020, 8:07 PM

## 2020-12-14 NOTE — Progress Notes (Signed)
Patient denies SI, HI, and AVH this shift. Patient has been compliant with medications and groups.  Patient encouraged out of bed this shift.   Assess patient for safety, offer medications as prescribed, engage patient in 1:1 staff talks.   Patient able to contract for safety. Continue to monitor as planned.

## 2020-12-14 NOTE — [facility] Group Notes (Signed)
[facility] Group Notes:  (Nursing/MHT/Case Management/Adjunct)  Date:  12/14/2020  Time:  11:55 AM  Type of Therapy:  Group Therapy  Participation Level:  Active  Participation Quality:  Appropriate  Affect:  Appropriate  Cognitive:  Appropriate  Insight:  Appropriate  Engagement in Group:  Engaged and Improving  Modes of Intervention:  Orientation  Summary of Progress/Problems: Pt goal for today is to be able to exercise.   Ranisha Allaire J Delilah Mulgrew 12/14/2020, 11:55 AM

## 2020-12-14 NOTE — Progress Notes (Signed)
Noah Ramsey has been up and down all night.  He took his hs medications without difficulty.  He briefly slept but woke up requesting something for his pain, anxiety and restlessness.   Tylenol and Ativan given with good relief.  He was finally able to fall asleep.  He denied SI/HI or visual hallucinations.  He continues to have AH of demons.  He is very focused on exercise and was noted doing push up and squats on the hall.  Talked with him about his shower today and he stated he felt good.  Continued to encourage proper hygiene but he stated "I didn't know how to take a shower."  Discussed with him that he needs to do exactly what day shift nurse instructed him to do.  He is currently resting with his eyes closed and appears to be asleep.  Q 15 minute checks maintained for safety.   12/13/20 2109  Psych Admission Type (Psych Patients Only)  Admission Status Involuntary  Psychosocial Assessment  Patient Complaints Anxiety  Eye Contact Staring  Facial Expression Fixed smile  Affect Apprehensive  Speech Slurred;Soft  Interaction Childlike  Motor Activity Restless;Fidgety  Appearance/Hygiene Unremarkable  Behavior Characteristics Cooperative  Mood Anxious;Pleasant;Preoccupied  Thought Process  Coherency Loose associations  Content Preoccupation  Delusions None reported or observed  Perception Hallucinations  Hallucination Auditory  Judgment Poor  Confusion None  Danger to Self  Current suicidal ideation? Denies  Danger to Others  Danger to Others None reported or observed

## 2020-12-15 DIAGNOSIS — F250 Schizoaffective disorder, bipolar type: Secondary | ICD-10-CM | POA: Diagnosis not present

## 2020-12-15 MED ORDER — RISPERIDONE 2 MG PO TBDP
2.0000 mg | ORAL_TABLET | Freq: Every day | ORAL | Status: DC
  Administered 2020-12-16 – 2020-12-18 (×3): 2 mg via ORAL
  Filled 2020-12-15 (×5): qty 1

## 2020-12-15 MED ORDER — RISPERIDONE 2 MG PO TBDP
3.0000 mg | ORAL_TABLET | Freq: Every day | ORAL | Status: DC
  Administered 2020-12-15 – 2020-12-25 (×11): 3 mg via ORAL
  Filled 2020-12-15 (×15): qty 1

## 2020-12-15 MED ORDER — RISPERIDONE 2 MG PO TBDP: 3.0000 mg | ORAL_TABLET | Freq: Two times a day (BID) | ORAL | Status: DC

## 2020-12-15 NOTE — Progress Notes (Signed)
[facility] MD Progress Note  12/15/2020 6:31 PM Noah Ramsey  MRN:  [id number]  Reason for admission: Patient is a 25 year old male with history of schizoaffective disorder admitted for insomnia x 5 days, aggressive behavior, delusions, suicidal ideation and auditory hallucinations.  Objective: Medical record reviewed.  Patient's case discussed in detail with members of the treatment team.  I met with and evaluated the patient on the unit today for follow-up today.  Patient unchanged from yesterday.  He continues to talk about demons and other grandiose and paranoid delusional topics.  He endorses intermittent AH of demons but is unable to elaborate regarding what they say.  Patient denies SI, AI, HI.  He denies tongue thickness, lightheadedness, dizziness, muscle spasms, tremor, trouble swallowing, trouble breathing or other medication side effects.  Patient denies any physical problems.  Patient states that he is eating and sleeping well.  Staff document that he slept 6.25 hours last night.  Patient is taking scheduled medications as prescribed.  Vital signs this morning include BP of 124/72 sitting and 133/88 standing; pulse of 90 sitting and 113 standing; O2 sat of 99% and temperature of 98.1.  Staff document that patient continues intermittently to respond to internal stimuli.  Principal Problem: Schizoaffective disorder ([place]) Diagnosis: Principal Problem:   Schizoaffective disorder ([place])  Total Time spent with patient:  20 minutes  Past Psychiatric History: See admission H&P  Past Medical History:  Past Medical History:  Diagnosis Date   Elevated CPK    per patient   Schizophrenia ([place])    History reviewed. No pertinent surgical history. Family History:  Family History  Problem Relation Age of Onset   Mental illness Brother    Family Psychiatric  History: See admission H&P Social History:  Social History   Substance and Sexual Activity  Alcohol Use Never     Social History    Substance and Sexual Activity  Drug Use Never    Social History   Socioeconomic History   Marital status: Single    Spouse name: Not on file   Number of children: Not on file   Years of education: Not on file   Highest education level: Not on file  Occupational History   Not on file  Tobacco Use   Smoking status: Never   Smokeless tobacco: Never  Substance and Sexual Activity   Alcohol use: Never   Drug use: Never   Sexual activity: Not on file  Other Topics Concern   Not on file  Social History Narrative   Not on file   Social Determinants of Health   Financial Resource Strain: Not on file  Food Insecurity: Not on file  Transportation Needs: Not on file  Physical Activity: Not on file  Stress: Not on file  Social Connections: Not on file   Additional Social History:                         Sleep: Fair  Appetite:  Good  Current Medications: Current Facility-Administered Medications  Medication Dose Route Frequency Provider Last Rate Last Admin   acetaminophen (TYLENOL) tablet 650 mg  650 mg Oral Q6H PRN Rankin, Shuvon B, NP   650 mg at 12/14/20 0203   alum & mag hydroxide-simeth (MAALOX/MYLANTA) 200-200-20 MG/5ML suspension 30 mL  30 mL Oral Q4H PRN Rankin, Shuvon B, NP       benztropine (COGENTIN) tablet 0.5 mg  0.5 mg Oral Q6H PRN Arthor Captain, MD  benztropine (COGENTIN) tablet 0.5 mg  0.5 mg Oral BID Arthor Captain, MD   0.5 mg at 12/15/20 0813   diphenhydrAMINE (BENADRYL) injection 50 mg  50 mg Intramuscular Q6H PRN Arthor Captain, MD       hydrOXYzine (ATARAX/VISTARIL) tablet 25 mg  25 mg Oral Q6H PRN Arthor Captain, MD   25 mg at 12/14/20 2118   LORazepam (ATIVAN) injection 2 mg  2 mg Intramuscular BID PRN Arthor Captain, MD       LORazepam (ATIVAN) tablet 1 mg  1 mg Oral Q6H PRN Arthor Captain, MD   1 mg at 12/14/20 0203   magnesium hydroxide (MILK OF MAGNESIA) suspension 30 mL  30 mL Oral Daily PRN Rankin, Shuvon B, NP        metFORMIN (GLUCOPHAGE) tablet 500 mg  500 mg Oral BID WC Rankin, Shuvon B, NP   500 mg at 12/15/20 0813   Oxcarbazepine (TRILEPTAL) tablet 300 mg  300 mg Oral BID Rankin, Shuvon B, NP   300 mg at 12/15/20 0813   propranolol (INDERAL) tablet 40 mg  40 mg Oral Q8H Arthor Captain, MD   40 mg at 12/15/20 1343   [START ON 12/16/2020] risperiDONE (RISPERDAL M-TABS) disintegrating tablet 2 mg  2 mg Oral Daily Arthor Captain, MD       risperiDONE (RISPERDAL M-TABS) disintegrating tablet 3 mg  3 mg Oral QHS Arthor Captain, MD        Lab Results:  No results found for this or any previous visit (from the past 54 hour(s)).    Blood Alcohol level:  Lab Results  Component Value Date   ETH <10 11/29/2020   ETH <10 89/16/9450    Metabolic Disorder Labs: Lab Results  Component Value Date   HGBA1C 5.5 12/04/2020   MPG 111.15 12/04/2020   No results found for: PROLACTIN Lab Results  Component Value Date   CHOL 123 12/02/2020   TRIG 74 12/02/2020   HDL 35 (L) 12/02/2020   CHOLHDL 3.5 12/02/2020   VLDL 15 12/02/2020   LDLCALC 73 12/02/2020   LDLCALC 101 (H) 06/03/2019    Physical Findings: AIMS: Facial and Oral Movements Muscles of Facial Expression: None, normal Lips and Perioral Area: None, normal Jaw: None, normal Tongue: Mild,Extremity Movements Upper (arms, wrists, hands, fingers): None, normal Lower (legs, knees, ankles, toes): None, normal, Trunk Movements Neck, shoulders, hips: None, normal, Overall Severity Severity of abnormal movements (highest score from questions above): None, normal Incapacitation due to abnormal movements: None, normal Patient's awareness of abnormal movements (rate only patient's report): No Awareness, Dental Status Current problems with teeth and/or dentures?: No Does patient usually wear dentures?: No  CIWA:    COWS:     Musculoskeletal: Strength & Muscle Tone: within normal limits Gait & Station: normal Patient leans: N/A  Psychiatric  Specialty Exam:  Presentation  General Appearance: Appropriate for Environment  Eye Contact:Good (Less frequent staring)  Speech:Normal Rate  Speech Volume:Normal  Handedness:Right   Mood and Affect  Mood:Anxious  Affect:Flat   Thought Process  Thought Processes:Coherent  Descriptions of Associations:Loose  Orientation:Full (Time, Place and Person)  Thought Content:Delusions; Paranoid Ideation  History of Schizophrenia/Schizoaffective disorder:Yes  Duration of Psychotic Symptoms:Greater than six months  Hallucinations:Hallucinations: Auditory  Ideas of Reference:Delusions; Paranoia  Suicidal Thoughts:Suicidal Thoughts: No  Homicidal Thoughts:Homicidal Thoughts: Yes, Passive HI Passive Intent and/or Plan: Without Intent; Without Plan   Sensorium  Memory:Immediate Fair; Recent Fair  Judgment:Impaired  Insight:Shallow  Executive Functions  Concentration:Fair  Attention Span:Fair  [place]   Psychomotor Activity  Psychomotor Activity:Psychomotor Activity: Normal   Assets  Assets:Communication Skills; Housing; Social Support   Sleep  Sleep:Sleep: Fair Number of Hours of Sleep: 6.25    Physical Exam: Physical Exam Vitals and nursing note reviewed.  Constitutional:      General: He is not in acute distress.    Appearance: He is not diaphoretic.  HENT:     Head: Normocephalic and atraumatic.  Cardiovascular:     Rate and Rhythm: Normal rate.  Pulmonary:     Effort: Pulmonary effort is normal.  Neurological:     General: No focal deficit present.     Mental Status: He is alert and oriented to person, place, and time.   Review of Systems  Constitutional:  Negative for chills, diaphoresis and fever.  HENT:  Negative for sore throat.   Respiratory:  Negative for cough and shortness of breath.   Cardiovascular:  Negative for chest pain and palpitations.  Gastrointestinal:  Negative for  constipation, diarrhea, nausea and vomiting.  Musculoskeletal:  Negative for myalgias.  Neurological:  Negative for dizziness, seizures and headaches.  Psychiatric/Behavioral:  Positive for hallucinations. Negative for depression, substance abuse and suicidal ideas. The patient is nervous/anxious. The patient does not have insomnia.   Blood pressure 131/81, pulse 93, temperature 98.1 F (36.7 C), temperature source Oral, resp. rate 18, height 5' 6"  (1.676 m), weight 134.3 kg, SpO2 100 %. Body mass index is 47.78 kg/m.   Treatment Plan Summary: Patient is a 25 year old male with schizoaffective disorder, bipolar type admitted for treatment and stabilization of worsening psychotic and mood symptoms, suicidal ideation and aggressive behavior.  Patient is displaying some improvement in organization of thought processes and is less exclusively focused on delusional themes.  He is calmer, sleeping better and less intrusive.  Overall he continues to display ongoing symptoms of disorganized thought processes, delusional thought content and hallucinations.  He is tolerating upward titration of risperidone.  Daily contact with patient to assess and evaluate symptoms and progress in treatment and Medication management  Schizoaffective disorder -Increase Risperdal M tabs to 2 mg PO QAM and 3 mg PO QHS for psychotic symptoms -Goal is for eventual transition to long-acting injectable antipsychotic  -Continue Trileptal 300 mg twice daily for mood stabilization -Continue Seroquel 50 mg at bedtime as needed  Anxiety/EPS -Continue propranolol 40 mg Q8H for restlessness and anxiety -Continue benztropine 0.5 mg PO twice daily   -Continue benztropine 0.5 mg PO Q6H PRN for tremors, muscle stiffness -Continue diphenhydramine 50 mg IM every 6 hours as needed for acute dystonic reaction  Diabetes Mellitus -Continue metformin 500 mg twice daily with meals  Agitation  -Continue lorazepam 1 mg PO Q6H PRN anxiety,  restlessness, agitation -Continue lorazepam 2 mg IM BID PRN for severe agitation only if patient unable to take oral PRN medications for agitation  Anxiety -Continue hydroxyzine 25 mg Q6H PRN  Discharge planning in progress  Arthor Captain, MD 12/15/2020, 6:31 PM

## 2020-12-15 NOTE — Progress Notes (Signed)
Pt visible on the unit this evening. Pt continues to endorse "I'm not schizophrenic I have psychosis" Pt denies AVH at this time, but appears to continue to respond to internal stimuli at times.     12/15/20 2300  Psych Admission Type (Psych Patients Only)  Admission Status Involuntary  Psychosocial Assessment  Patient Complaints Anxiety  Eye Contact Brief  Facial Expression Fixed smile  Affect Appropriate to circumstance  Speech Soft  Interaction Assertive  Motor Activity Slow  Appearance/Hygiene Unremarkable  Behavior Characteristics Cooperative  Mood Anxious  Thought Process  Coherency WDL  Content Preoccupation  Delusions None reported or observed  Perception Hallucinations  Hallucination Auditory  Judgment Poor  Confusion None  Danger to Self  Current suicidal ideation? Denies  Danger to Others  Danger to Others None reported or observed

## 2020-12-15 NOTE — Progress Notes (Signed)
Recreation Therapy Notes  Date: 8.26.22 Time: 1000 Location: [address] Hall Dayroom  Group Topic: Communication, Team Building, Problem Solving  Goal Area(s) Addresses:  Patient will effectively work with peer towards shared goal.  Patient will identify skills used to make activity successful.  Patient will identify how skills used during activity can be applied to reach post d/c goals.   Behavioral Response: Minimal  Intervention: STEM Activity- Glass blower/designer  Activity: Tallest [employer]. In teams of 5-6, patients were given 25 small craft pipe cleaners. Using the materials provided, patients were instructed to compete again the opposing team(s) to build the tallest free-standing structure from floor level. The activity was timed; difficulty increased by Clinical research associate as Production designer, theatre/television/film continued.  Systematically resources were removed with additional directions for example, placing one arm behind their back, working in silence, and shape stipulations. LRT facilitated post-activity discussion reviewing team processes and necessary communication skills involved in completion. Patients were encouraged to reflect how the skills utilized, or not utilized, in this activity can be incorporated to positively impact support systems post discharge.  Education: Pharmacist, community, Scientist, physiological, Discharge Planning   Education Outcome: Acknowledges education/In group clarification offered/Needs additional education.   Clinical Observations/Feedback: Pt gave very little effort and kept saying he has dementia to keep from participating in activity and debriefing.        Caroll Rancher, LRT/CTRS      Caroll Rancher A 12/15/2020 11:15 AM

## 2020-12-15 NOTE — Progress Notes (Addendum)
Adult Psychoeducational Group Note  Date:  12/15/2020 Time:  9:24 PM  Group Topic/Focus:  Wrap-Up Group:   The focus of this group is to help patients review their daily goal of treatment and discuss progress on daily workbooks.  Participation Level:  Active  Participation Quality:  Appropriate and Attentive  Affect:  Appropriate  Cognitive:  Alert and Appropriate  Insight: Appropriate and Good  Engagement in Group:  Engaged, Improving, and Supportive  Modes of Intervention:  Discussion and Support  Additional Comments: Pt articulated that his goal for today was fitness, and this goal was accomplished. Pt stated he did talk with staff about his care. Pt conveyed that he made a phone call to his mother and reported relationship with his family and support system was improving. Pt reported he took all medications provided and attended all meal services with 100% intake. Pt said his appetite was good today. Pt evaluated his sleep last night as good. Pt verbalized he felt good about himself and rated his overall day an 8 out of 10 on this date. Pt articulated that he had no physical pain. Pt denies no auditory or visual hallucinations or thoughts of harming himself or others. Pt said he would alert staff if anything changed. End of Wrap-Up Group progress note.   Nicoletta Dress 12/15/2020, 9:24 PM

## 2020-12-15 NOTE — Progress Notes (Signed)
Pt continues to respond to internal stimuli, pt apologized for his behavior last week , " I'm sorry how I was acting the voices were telling me you guys were bad"

## 2020-12-15 NOTE — [facility] Group Notes (Signed)
   Spirituality group facilitated by Kathleen Argue, BCC.   Group Description: Group focused on topic of hope. Patients participated in facilitated discussion around topic, connecting with one another around experiences and definitions for hope. Group members engaged with visual explorer photos, reflecting on what hope looks like for them today. Group engaged in discussion around how their definitions of hope are present today in hospital.   Modalities: Psycho-social ed, Adlerian, Narrative, MI   Patient Progress: Patient was present in group at the beginning.  When called upon to participate, he stated that he did not have any questions and left the group.  Chaplain Dyanne Carrel, Bcc Pager, [phone] 9:58 PM

## 2020-12-15 NOTE — [facility] Group Notes (Signed)
[facility] LCSW Group Therapy 12/15/2020 1:15 PM  Type of Therapy: Group Therapy: Boundaries  Description of Group: This group will address the use of boundaries in their personal lives. Patients will explore why boundaries are important, the difference between healthy and unhealthy boundaries, and negative and postive outcomes of different boundaries and will look at how boundaries can be crossed.  Patients will be encouraged to identify current boundaries in their own lives and identify what kind of boundary is being set. Facilitators will guide patients in utilizing problem-solving interventions to address and correct types boundaries being used and to address when no boundary is being used. Understanding and applying boundaries will be explored and addressed for obtaining and maintaining a balanced life. Patients will be encouraged to explore ways to assertively make their boundaries and needs known to significant others in their lives, using other group members and facilitator for role play, support, and feedback.   Therapeutic Goals: 1. Patient will identify areas in their life where setting clear boundaries could be used to improve their life.  2. Patient will identify signs/triggers that a boundary is not being respected. 3. Patient will identify two ways to set boundaries in order to achieve balance in their lives: 4. Patient will demonstrate ability to communicate their needs and set boundaries through discussion and/or role plays  Participation Level: Minimal  Participation Quality: Appropriate and Inattentive  Affect: Blunted  Cognitive: Delusional  Insight: Lacking  Engagement in Therapy: Poor  Modes of Intervention: Education and Limit-setting  Summary of Progress/Problems: Patient continued to state that he had dementia throughout group and offered no other comments or participation.

## 2020-12-15 NOTE — [facility] Group Notes (Signed)
The focus of this group is to help patients establish daily goals to achieve during treatment and discuss how the patient can incorporate goal setting into their daily lives to aide in recovery.  Pt did not attend group 

## 2020-12-15 NOTE — Progress Notes (Signed)
Pt given PRN Vistaril with HS medication    12/15/20 0100  Psych Admission Type (Psych Patients Only)  Admission Status Involuntary  Psychosocial Assessment  Patient Complaints Anxiety  Eye Contact Brief  Facial Expression Fixed smile  Affect Appropriate to circumstance  Speech Soft  Interaction Assertive  Motor Activity Slow  Appearance/Hygiene Unremarkable  Behavior Characteristics Cooperative  Mood Anxious  Thought Process  Coherency Loose associations  Content Preoccupation  Delusions None reported or observed  Perception Hallucinations  Hallucination Auditory  Judgment Poor  Confusion None  Danger to Self  Current suicidal ideation? Denies  Danger to Others  Danger to Others None reported or observed

## 2020-12-15 NOTE — Progress Notes (Signed)
Patient denies SI, HI, and AVH this shift. Patient has been compliant with medications and groups.  Patient encouraged out of bed this shift.   Assess patient for safety, offer medications as prescribed, engage patient in 1:1 staff talks.   Patient able to contract for safety. Continue to monitor as planned.  

## 2020-12-15 NOTE — [facility] Counselor (Signed)
CSW spoke with this pt's mother who shared that this pt's brother Waldron Labs lives at home along with her partner Carney Bern. She is aware that Aubra is triggered by both of these individuals.    Ruthann Cancer MSW, LCSW Clincal Social Worker  [facility]

## 2020-12-16 DIAGNOSIS — F250 Schizoaffective disorder, bipolar type: Secondary | ICD-10-CM | POA: Diagnosis not present

## 2020-12-16 NOTE — Progress Notes (Addendum)
   12/16/20 1700  Psych Admission Type (Psych Patients Only)  Admission Status Involuntary  Psychosocial Assessment  Patient Complaints Anxiety  Eye Contact Brief  Facial Expression Fixed smile  Affect Appropriate to circumstance  Speech Soft  Interaction Assertive  Motor Activity Slow  Appearance/Hygiene Unremarkable  Behavior Characteristics Cooperative  Mood Anxious  Thought Process  Coherency WDL  Content Preoccupation  Delusions None reported or observed  Perception Hallucinations  Hallucination Auditory  Judgment Poor  Confusion None  Danger to Self  Current suicidal ideation? Denies  Danger to Others  Danger to Others None reported or observed   D. Pt presented as anxious, but has been calm and cooperative with no behavioral issues noted on the unit. Pt presented as very talkative and somewhat disorganized this am upon initial approach, and did endorse AH, but denied SI/HI. Per pt's self inventory, pt rated his depression, hopelessness and anxiety all O's today.  A. Labs and vitals monitored. Pt given and educated on medications. Pt supported emotionally and encouraged to express concerns and ask questions.   R. Pt remains safe with 15 minute checks. Will continue POC.

## 2020-12-16 NOTE — Progress Notes (Signed)
Pt did not attend psychoeducational group. 

## 2020-12-16 NOTE — [facility] Group Notes (Signed)
Adult Psychoeducational Group Note  Date:  12/16/2020 Time:  6:46 PM   Participation Level:  Did Not Attend   Noah Ramsey 12/16/2020, 6:46 PM

## 2020-12-16 NOTE — Progress Notes (Signed)
[facility] MD Progress Note  12/16/2020 1:30 PM Noah Ramsey  MRN:  [id number]  Reason for admission: Patient is a 25 year old male with history of schizoaffective disorder admitted for insomnia x 5 days, aggressive behavior, delusions, suicidal ideation and auditory hallucinations.  Objective: Reviewed Nursing notes, report received and care reviewed with members of our interdisciplinary team.  Writer met patient in his room awake.  He denied AVH, Not seen or heard responding to internal stimuli.  Patient  admitted he came to the hospital because he was not taking his medications as he should.  He believes his medications were not effective and he stopped taking any.  He has been compliant with medications, attends few therapy groups.  His discharge plan is to move back to [place] with his mother.  He plans to continue treatment in the outpatient setting. Patient was advised to work with SW for appropriate discharge plan  which will include outpatient Mental health treatment and therapy.  Patient was advised to attend and participate in group activities. Principal Problem: Schizoaffective disorder ([place]) Diagnosis: Principal Problem:   Schizoaffective disorder ([place])  Total Time spent with patient:  15 minutes  Past Psychiatric History: See admission H&P  Past Medical History:  Past Medical History:  Diagnosis Date  . Elevated CPK    per patient  . Schizophrenia ([place])    History reviewed. No pertinent surgical history. Family History:  Family History  Problem Relation Age of Onset  . Mental illness Brother    Family Psychiatric  History: See admission H&P Social History:  Social History   Substance and Sexual Activity  Alcohol Use Never     Social History   Substance and Sexual Activity  Drug Use Never    Social History   Socioeconomic History  . Marital status: Single    Spouse name: Not on file  . Number of children: Not on file  . Years of education: Not on file  . Highest  education level: Not on file  Occupational History  . Not on file  Tobacco Use  . Smoking status: Never  . Smokeless tobacco: Never  Substance and Sexual Activity  . Alcohol use: Never  . Drug use: Never  . Sexual activity: Not on file  Other Topics Concern  . Not on file  Social History Narrative  . Not on file   Social Determinants of Health   Financial Resource Strain: Not on file  Food Insecurity: Not on file  Transportation Needs: Not on file  Physical Activity: Not on file  Stress: Not on file  Social Connections: Not on file   Additional Social History:                         Sleep: Fair  Appetite:  Good  Current Medications: Current Facility-Administered Medications  Medication Dose Route Frequency Provider Last Rate Last Admin  . acetaminophen (TYLENOL) tablet 650 mg  650 mg Oral Q6H PRN Rankin, Shuvon B, NP   650 mg at 12/14/20 0203  . alum & mag hydroxide-simeth (MAALOX/MYLANTA) 200-200-20 MG/5ML suspension 30 mL  30 mL Oral Q4H PRN Rankin, Shuvon B, NP      . benztropine (COGENTIN) tablet 0.5 mg  0.5 mg Oral Q6H PRN Arthor Captain, MD      . benztropine (COGENTIN) tablet 0.5 mg  0.5 mg Oral BID Arthor Captain, MD   0.5 mg at 12/16/20 1856  . diphenhydrAMINE (BENADRYL) injection 50 mg  50  mg Intramuscular Q6H PRN Arthor Captain, MD      . hydrOXYzine (ATARAX/VISTARIL) tablet 25 mg  25 mg Oral Q6H PRN Arthor Captain, MD   25 mg at 12/15/20 2046  . LORazepam (ATIVAN) injection 2 mg  2 mg Intramuscular BID PRN Arthor Captain, MD      . LORazepam (ATIVAN) tablet 1 mg  1 mg Oral Q6H PRN Arthor Captain, MD   1 mg at 12/14/20 0203  . magnesium hydroxide (MILK OF MAGNESIA) suspension 30 mL  30 mL Oral Daily PRN Rankin, Shuvon B, NP      . metFORMIN (GLUCOPHAGE) tablet 500 mg  500 mg Oral BID WC Rankin, Shuvon B, NP   500 mg at 12/16/20 0939  . Oxcarbazepine (TRILEPTAL) tablet 300 mg  300 mg Oral BID Rankin, Shuvon B, NP   300 mg at 12/16/20 0939  .  propranolol (INDERAL) tablet 40 mg  40 mg Oral Q8H Arthor Captain, MD   40 mg at 12/16/20 0557  . risperiDONE (RISPERDAL M-TABS) disintegrating tablet 2 mg  2 mg Oral Daily Arthor Captain, MD   2 mg at 12/16/20 8588  . risperiDONE (RISPERDAL M-TABS) disintegrating tablet 3 mg  3 mg Oral QHS Arthor Captain, MD   3 mg at 12/15/20 2046    Lab Results:  No results found for this or any previous visit (from the past 48 hour(s)).    Blood Alcohol level:  Lab Results  Component Value Date   ETH <10 11/29/2020   ETH <10 50/27/7412    Metabolic Disorder Labs: Lab Results  Component Value Date   HGBA1C 5.5 12/04/2020   MPG 111.15 12/04/2020   No results found for: PROLACTIN Lab Results  Component Value Date   CHOL 123 12/02/2020   TRIG 74 12/02/2020   HDL 35 (L) 12/02/2020   CHOLHDL 3.5 12/02/2020   VLDL 15 12/02/2020   LDLCALC 73 12/02/2020   LDLCALC 101 (H) 06/03/2019    Physical Findings: AIMS: Facial and Oral Movements Muscles of Facial Expression: None, normal Lips and Perioral Area: None, normal Jaw: None, normal Tongue: Mild,Extremity Movements Upper (arms, wrists, hands, fingers): None, normal Lower (legs, knees, ankles, toes): None, normal, Trunk Movements Neck, shoulders, hips: None, normal, Overall Severity Severity of abnormal movements (highest score from questions above): None, normal Incapacitation due to abnormal movements: None, normal Patient's awareness of abnormal movements (rate only patient's report): No Awareness, Dental Status Current problems with teeth and/or dentures?: No Does patient usually wear dentures?: No  CIWA:    COWS:     Musculoskeletal: Strength & Muscle Tone: within normal limits Gait & Station: normal Patient leans: N/A  Psychiatric Specialty Exam:  Presentation  General Appearance: Casual; Appropriate for Environment  Eye Contact:Good  Speech:Normal Rate  Speech Volume:Normal  Handedness:Right   Mood and Affect   Mood:Euthymic  Affect:Appropriate   Thought Process  Thought Processes:Coherent  Descriptions of Associations:Intact  Orientation:Full (Time, Place and Person)  Thought Content:Logical  History of Schizophrenia/Schizoaffective disorder:Yes  Duration of Psychotic Symptoms:Greater than six months  Hallucinations:Hallucinations: None  Ideas of Reference:Delusions; Paranoia  Suicidal Thoughts:Suicidal Thoughts: No  Homicidal Thoughts:Homicidal Thoughts: No   Sensorium  Memory:Immediate Good; Immediate Fair; Recent Fair; Remote Good  Judgment:Fair  Insight:Fair   Executive Functions  Concentration:Fair  Attention Span:Good  [place] of Knowledge:Good  Language:Good   Psychomotor Activity  Psychomotor Activity:Psychomotor Activity: Normal   Assets  Assets:Communication Skills; Desire for Improvement   Sleep  Sleep:Sleep: Good Number of Hours of Sleep: 5.5    Physical Exam: Physical Exam Vitals and nursing note reviewed.  Constitutional:      General: He is not in acute distress.    Appearance: He is not diaphoretic.  HENT:     Head: Normocephalic and atraumatic.  Cardiovascular:     Rate and Rhythm: Normal rate.  Pulmonary:     Effort: Pulmonary effort is normal.  Neurological:     General: No focal deficit present.     Mental Status: He is alert and oriented to person, place, and time.   Review of Systems  Constitutional:  Negative for chills, diaphoresis and fever.  HENT:  Negative for sore throat.   Respiratory:  Negative for cough and shortness of breath.   Cardiovascular:  Negative for chest pain and palpitations.  Gastrointestinal:  Negative for constipation, diarrhea, nausea and vomiting.  Musculoskeletal:  Negative for myalgias.  Neurological:  Negative for dizziness, seizures and headaches.  Psychiatric/Behavioral:  Positive for hallucinations. Negative for depression, substance abuse and suicidal ideas. The patient is  nervous/anxious. The patient does not have insomnia.   Blood pressure 113/70, pulse 97, temperature 97.8 F (36.6 C), temperature source Oral, resp. rate 16, height _0  (1.676 m), weight 134.3 kg, SpO2 100 %. Body mass index is 47.78 kg/m.   Treatment Plan Summary: Patient is a 25 year old male with schizoaffective disorder, bipolar type admitted for treatment and stabilization of worsening psychotic and mood symptoms, suicidal ideation and aggressive behavior.  Patient is displaying some improvement in organization of thought processes and is less exclusively focused on delusional themes.  He is calmer, sleeping better and less intrusive.  Overall he continues to display ongoing symptoms of disorganized thought processes, delusional thought content and hallucinations.  He is tolerating upward titration of risperidone.  Daily contact with patient to assess and evaluate symptoms and progress in treatment and Medication management  Schizoaffective disorder - Continue Risperdal M tabs to 2 mg PO QAM and 3 mg PO QHS for psychotic symptoms -Goal is for eventual transition to long-acting injectable antipsychotic  -Continue Trileptal 300 mg twice daily for mood stabilization -Continue Seroquel 50 mg at bedtime as needed  Anxiety/EPS -Continue propranolol 40 mg Q8H for restlessness and anxiety -Continue benztropine 0.5 mg PO twice daily   -Continue benztropine 0.5 mg PO Q6H PRN for tremors, muscle stiffness -Continue diphenhydramine 50 mg IM every 6 hours as needed for acute dystonic reaction  Diabetes Mellitus -Continue metformin 500 mg twice daily with meals  Agitation  -Continue lorazepam 1 mg PO Q6H PRN anxiety, restlessness, agitation -Continue lorazepam 2 mg IM BID PRN for severe agitation only if patient unable to take oral PRN medications for agitation  Anxiety -Continue hydroxyzine 25 mg Q6H PRN  Discharge planning in progress-Plans to move back to [place] with his mom.  Delfin Gant, NP-PMHNP-BC 12/16/2020, 1:30 PM

## 2020-12-16 NOTE — [facility] Group Notes (Signed)
LCSW Group Therapy Note  12/16/2020  Type of Therapy and Topic:  Group Therapy - Healthy vs Unhealthy Coping Skills  Participation Level:  Active   Description of Group The focus of this group was to determine what unhealthy coping techniques typically are used by group members and what healthy coping techniques would be helpful in coping with various problems. Patients were guided in becoming aware of the differences between healthy and unhealthy coping techniques. Patients were asked to identify 2-3 healthy coping skills they would like to learn to use more effectively, and many mentioned meditation, breathing, and relaxation. These were explained, samples demonstrated, and resources shared for how to learn more at discharge. At group closing, additional ideas of healthy coping skills were shared in a fun exercise.  Therapeutic Goals Patients learned that coping is what human beings do all day long to deal with various situations in their lives Patients defined and discussed healthy vs unhealthy coping techniques Patients identified their preferred coping techniques and identified whether these were healthy or unhealthy Patients determined 2-3 healthy coping skills they would like to become more familiar with and use more often, and practiced a few medications Patients provided support and ideas to each other   Summary of Patient Progress:  Pt was given packet of worksheets discussing coping skills. Pt was encouraged to seek out CSW if any questions arose while completing the worksheets.    Therapeutic Modalities Cognitive Behavioral Therapy Motivational Interviewing  Leeland Lovelady, LCSWA Clinicial Social Worker [facility]   

## 2020-12-16 NOTE — Progress Notes (Signed)
Pt did not attend orientation/goals group. 

## 2020-12-16 NOTE — Progress Notes (Signed)
Patient has been up tonight in the dayroom attended group and has been sociable with Clinical research associate. He reports having had a good day and spoke of his diagnosis and how he plans to stay on his medications but will need someone to help him remember when to take them. Writer asked if he had family  that will be able to assist and he spoke about moving to [place] to get his own apartment. Support given and safety maintained with 15 min checks.  12/16/20 2100  Psych Admission Type (Psych Patients Only)  Admission Status Involuntary  Psychosocial Assessment  Patient Complaints Anxiety  Eye Contact Brief  Facial Expression Fixed smile  Affect Appropriate to circumstance  Speech Soft  Interaction Assertive  Motor Activity Slow  Appearance/Hygiene Unremarkable  Behavior Characteristics Cooperative  Mood Anxious;Pleasant  Thought Process  Coherency WDL  Content Preoccupation  Delusions None reported or observed  Perception Hallucinations  Hallucination Auditory  Judgment Poor  Confusion None  Danger to Self  Current suicidal ideation? Denies  Danger to Others  Danger to Others None reported or observed

## 2020-12-17 DIAGNOSIS — F250 Schizoaffective disorder, bipolar type: Secondary | ICD-10-CM | POA: Diagnosis not present

## 2020-12-17 NOTE — Progress Notes (Signed)
   12/17/20 1200  Psych Admission Type (Psych Patients Only)  Admission Status Involuntary  Psychosocial Assessment  Patient Complaints Anxiety  Eye Contact Brief  Facial Expression Fixed smile  Affect Appropriate to circumstance  Speech Soft  Interaction Assertive  Motor Activity Slow  Appearance/Hygiene Unremarkable  Behavior Characteristics Cooperative  Mood Anxious;Pleasant  Thought Process  Coherency WDL  Content Preoccupation  Delusions None reported or observed  Perception Hallucinations  Hallucination Auditory  Judgment Poor  Confusion None  Danger to Self  Current suicidal ideation? Denies  Danger to Others  Danger to Others None reported or observed

## 2020-12-17 NOTE — Progress Notes (Addendum)
[facility] Progress Note  12/17/2020 2:27 PM Noah Ramsey  MRN:  [id number]  Reason for admission: Patient is a 25 year old male with history of schizoaffective disorder admitted for insomnia x 5 days, aggressive behavior, delusions, suicidal ideation and auditory hallucinations.  Objective: Reviewed Nursing notes, report received and care reviewed with members of our interdisciplinary team.  Writer walked with patient to his room for daily evaluation.  Patient reported good night sleep and appetite.  He reported that his mood is up and down but could not explain what he meant.  Patient remains confused and disorganized but Hygiene is improving as he showers but keeps some of his clothing on the floor.  He denied AVH but suddenly stated that when he is anxious he hears things and not all the time.  He is confused and disorganized when we discussed his discharge plan today.  He is not sure where he is going after discharge.  Initially  patient informed this Clinical research associatewriter that he will be moving to [place] to help his two older brothers who are dealing with Schizophrenia and one with living situations and Alcohol.  Then he mentioned his mother is now in [place] and may go to stay with her.  Provider received his mother's number from him  [facility]([phone]) and called her but she did not answer the phone call and message left for her to call to clarify some informations.  Patient is compliant with medications and Nursing reported that he is beginning to participate in group and socialize with peers.  He informed his Nurse that he plans to move to [place] and get his own apartment and he plans to remain on medications.  He denied SI/HI/AVH. Collateral information from MS Noah Ramsey at 4:17 pm is that patient, her son is coming back to stay in [place] with her.  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  She said patient was diagnosed with  mild Cognitive impairment. Patient completed high school but was in a class with limited student.  She said patient has a senior brother in [place] who also suffers from Schizophrenia.  She denied any plan by patient to go to [place] to live.  Patient will come back to mom when discharged. Principal Problem: Schizoaffective disorder (HCC) Diagnosis: Principal Problem:   Schizoaffective disorder (HCC)  Total Time spent with patient: 30 minutes including 15 minutes used to speak with mother.  Past Psychiatric History: See admission H&P  Past Medical History:  Past Medical History:  Diagnosis Date   Elevated CPK    per patient   Schizophrenia [facility](HCC)    History reviewed. No pertinent surgical history. Family History:  Family History  Problem Relation Age of Onset   Mental illness Brother    Family Psychiatric  History: See admission H&P Social History:  Social History   Substance and Sexual Activity  Alcohol Use Never     Social History   Substance and Sexual Activity  Drug Use Never    Social History   Socioeconomic History   Marital status: Single    Spouse name: Not on file   Number of children: Not on file   Years of education: Not on file   Highest education level: Not on file  Occupational History   Not on file  Tobacco Use   Smoking status: Never   Smokeless tobacco: Never  Substance and Sexual Activity   Alcohol use: Never   Drug use: Never  Sexual activity: Not on file  Other Topics Concern   Not on file  Social History Narrative   Not on file   Social Determinants of Health   Financial Resource Strain: Not on file  Food Insecurity: Not on file  Transportation Needs: Not on file  Physical Activity: Not on file  Stress: Not on file  Social Connections: Not on file   Additional Social History:                         Sleep: Good  Appetite:  Good  Current Medications: Current Facility-Administered Medications  Medication Dose Route Frequency  Provider Last Rate Last Admin   acetaminophen (TYLENOL) tablet 650 mg  650 mg Oral Q6H PRN Rankin, Shuvon B, NP   650 mg at 12/14/20 0203   alum & mag hydroxide-simeth (MAALOX/MYLANTA) 200-200-20 MG/5ML suspension 30 mL  30 mL Oral Q4H PRN Rankin, Shuvon B, NP       benztropine (COGENTIN) tablet 0.5 mg  0.5 mg Oral Q6H PRN Claudie Revering, MD       benztropine (COGENTIN) tablet 0.5 mg  0.5 mg Oral BID Claudie Revering, MD   0.5 mg at 12/17/20 0802   diphenhydrAMINE (BENADRYL) injection 50 mg  50 mg Intramuscular Q6H PRN Claudie Revering, MD       hydrOXYzine (ATARAX/VISTARIL) tablet 25 mg  25 mg Oral Q6H PRN Claudie Revering, MD   25 mg at 12/15/20 2046   LORazepam (ATIVAN) injection 2 mg  2 mg Intramuscular BID PRN Claudie Revering, MD       LORazepam (ATIVAN) tablet 1 mg  1 mg Oral Q6H PRN Claudie Revering, MD   1 mg at 12/16/20 2103   magnesium hydroxide (MILK OF MAGNESIA) suspension 30 mL  30 mL Oral Daily PRN Rankin, Shuvon B, NP       metFORMIN (GLUCOPHAGE) tablet 500 mg  500 mg Oral BID WC Rankin, Shuvon B, NP   500 mg at 12/17/20 0802   Oxcarbazepine (TRILEPTAL) tablet 300 mg  300 mg Oral BID Rankin, Shuvon B, NP   300 mg at 12/17/20 0802   propranolol (INDERAL) tablet 40 mg  40 mg Oral Q8H Claudie Revering, MD   40 mg at 12/17/20 5956   risperiDONE (RISPERDAL M-TABS) disintegrating tablet 2 mg  2 mg Oral Daily Claudie Revering, MD   2 mg at 12/17/20 0802   risperiDONE (RISPERDAL M-TABS) disintegrating tablet 3 mg  3 mg Oral QHS Claudie Revering, MD   3 mg at 12/16/20 1954    Lab Results:  No results found for this or any previous visit (from the past 48 hour(s)).    Blood Alcohol level:  Lab Results  Component Value Date   ETH <10 11/29/2020   ETH <10 11/07/2020    Metabolic Disorder Labs: Lab Results  Component Value Date   HGBA1C 5.5 12/04/2020   MPG 111.15 12/04/2020   No results found for: PROLACTIN Lab Results  Component Value Date   CHOL 123 12/02/2020   TRIG 74  12/02/2020   HDL 35 (L) 12/02/2020   CHOLHDL 3.5 12/02/2020   VLDL 15 12/02/2020   LDLCALC 73 12/02/2020   LDLCALC 101 (H) 06/03/2019    Physical Findings: AIMS: Facial and Oral Movements Muscles of Facial Expression: None, normal Lips and Perioral Area: None, normal Jaw: None, normal Tongue: Mild,Extremity Movements Upper (arms, wrists, hands, fingers): None, normal Lower (legs, knees,  ankles, toes): None, normal, Trunk Movements Neck, shoulders, hips: None, normal, Overall Severity Severity of abnormal movements (highest score from questions above): None, normal Incapacitation due to abnormal movements: None, normal Patient's awareness of abnormal movements (rate only patient's report): No Awareness, Dental Status Current problems with teeth and/or dentures?: No Does patient usually wear dentures?: No  CIWA:    COWS:     Musculoskeletal: Strength & Muscle Tone: within normal limits Gait & Station: normal Patient leans: N/A  Psychiatric Specialty Exam:  Presentation  General Appearance: Casual; Fairly Groomed; Neat  Eye Contact:Good  Speech:Normal Rate  Speech Volume:Normal  Handedness:Right   Mood and Affect  Mood:Anxious  Affect:Congruent   Thought Process  Thought Processes:Disorganized  Descriptions of Associations:Loose  Orientation:Full (Time, Place and Person)  Thought Content:Scattered  History of Schizophrenia/Schizoaffective disorder:Yes  Duration of Psychotic Symptoms:Greater than six months  Hallucinations:Hallucinations: Auditory (Hears voices when he is anxious.)  Ideas of Reference:None  Suicidal Thoughts:Suicidal Thoughts: No  Homicidal Thoughts:Homicidal Thoughts: No   Sensorium  Memory:Immediate Fair; Recent Fair; Remote Fair  Judgment:Fair  Insight:Fair   Executive Functions  Concentration:Fair  Attention Span:Fair  Recall:Fair  Fund of Knowledge:Fair  Language:Fair   Psychomotor Activity  Psychomotor  Activity:Psychomotor Activity: Normal   Assets  Assets:Communication Skills; Desire for Improvement   Sleep  Sleep:Sleep: Good Number of Hours of Sleep: 6    Physical Exam: Physical Exam Vitals and nursing note reviewed.  Constitutional:      General: He is not in acute distress.    Appearance: He is not diaphoretic.  HENT:     Head: Normocephalic and atraumatic.  Cardiovascular:     Rate and Rhythm: Normal rate.  Pulmonary:     Effort: Pulmonary effort is normal.  Neurological:     General: No focal deficit present.     Mental Status: He is alert and oriented to person, place, and time.   Review of Systems  Constitutional:  Negative for chills, diaphoresis and fever.  HENT:  Negative for sore throat.   Respiratory:  Negative for cough and shortness of breath.   Cardiovascular:  Negative for chest pain and palpitations.  Gastrointestinal:  Negative for constipation, diarrhea, nausea and vomiting.  Musculoskeletal:  Negative for myalgias.  Neurological:  Negative for dizziness, seizures and headaches.  Psychiatric/Behavioral:  Positive for hallucinations. Negative for depression, substance abuse and suicidal ideas. The patient is nervous/anxious. The patient does not have insomnia.   Blood pressure 120/77, pulse 99, temperature 97.8 F (36.6 C), temperature source Oral, resp. rate 16, height 5\' 6"  (1.676 m), weight 134.3 kg, SpO2 100 %. Body mass index is 47.78 kg/m.   Treatment Plan Summary: Patient is a 25 year old male with schizoaffective disorder, bipolar type admitted for treatment and stabilization of worsening psychotic and mood symptoms, suicidal ideation and aggressive behavior.  Patient is displaying some improvement in organization of thought processes and is less exclusively focused on delusional themes.  He is calmer, sleeping better and less intrusive.  Overall he continues to display ongoing symptoms of disorganized thought processes.  No reports of side  effects of upwards titrating Risperidone  so far.  Over all patient is improving some.  Daily contact with patient to assess and evaluate symptoms and progress in treatment and Medication management  Schizoaffective disorder - Continue Risperdal M tabs to 2 mg PO QAM and 3 mg PO QHS for psychotic symptoms -Goal is for eventual transition to long-acting injectable antipsychotic  -Continue Trileptal 300 mg twice daily for mood  stabilization -Continue Seroquel 50 mg at bedtime as needed  Anxiety/EPS -Continue propranolol 40 mg Q8H for restlessness and anxiety -Continue benztropine 0.5 mg PO twice daily   -Continue benztropine 0.5 mg PO Q6H PRN for tremors, muscle stiffness -Continue diphenhydramine 50 mg IM every 6 hours as needed for acute dystonic reaction  Diabetes Mellitus -Continue metformin 500 mg twice daily with meals  Agitation  -Continue lorazepam 1 mg PO Q6H PRN anxiety, restlessness, agitation -Continue lorazepam 2 mg IM BID PRN for severe agitation only if patient unable to take oral PRN medications for agitation  Anxiety -Continue hydroxyzine 25 mg Q6H PRN  Discharge planning in progress-Plans to move back to [place] and get his own apartment.  He is interested in taking medications after discharge but he also want somebody to remind him to do so.  Effort to reach his mother today for information regarding discharge planning failed as she did not answer her phone call.  Message left.  Earney Navy, NP-PMHNP-BC 12/17/2020, 2:27 PM

## 2020-12-17 NOTE — Progress Notes (Signed)
   12/17/20 2110  Psych Admission Type (Psych Patients Only)  Admission Status Involuntary  Psychosocial Assessment  Patient Complaints None  Eye Contact Brief  Facial Expression Fixed smile  Affect Appropriate to circumstance  Speech Soft  Interaction Assertive  Motor Activity Slow  Appearance/Hygiene Unremarkable  Behavior Characteristics Cooperative;Appropriate to situation  Mood Preoccupied;Pleasant  Thought Process  Coherency WDL  Content Preoccupation  Delusions None reported or observed  Perception Hallucinations  Hallucination Auditory  Judgment Poor  Confusion None  Danger to Self  Current suicidal ideation? Denies  Danger to Others  Danger to Others None reported or observed

## 2020-12-17 NOTE — Progress Notes (Signed)
Adult Psychoeducational Group Note  Date:  12/17/2020 Time:  1:18 AM  Group Topic/Focus:  Wrap-Up Group:   The focus of this group is to help patients review their daily goal of treatment and discuss progress on daily workbooks.  Participation Level:  Minimal  Participation Quality:  Appropriate  Affect:  Appropriate  Cognitive:  Appropriate  Insight: Limited  Engagement in Group:  Limited and Poor  Modes of Intervention:  Discussion  Additional Comments:  Pt stated his goal for today was to focus on his treatment plan. Pt stated he accomplished his goal today. Pt stated he talked with his doctor but did not get a chance to talk with his social worker about his care today. Pt rated his overall day a 10. Pt stated he really enjoyed going outside and playing basketball with his peers today.  Pt stated he was able to contact his mother today which improved his overall day.  Pt stated he felt better about himself today. Pt stated he was able to attend all meals. Pt stated he took all medications provided today. Pt stated he attend all groups held today. Pt stated his appetite was pretty good today. Pt rated sleep last night was pretty good. Pt stated the goal tonight was to get some rest. Pt stated he had no physical pain today. Pt deny visual hallucinations and auditory issues tonight. Pt denies thoughts of harming himself or others. Pt stated he would alert staff if anything changed.  Noah Ramsey 12/17/2020, 1:18 AM

## 2020-12-17 NOTE — [facility] Group Notes (Signed)
LCSW Group Therapy Notes    Type of Therapy and Topic: Group Therapy: Effective Communication   Participation Level: Active   Description of Group:  In this group patients will be asked to identify their own styles of communication as well as defining and identifying passive, assertive, and aggressive styles of communication. Participants will identify strategies to communicate in a more assertive manner in an effort to appropriately meet their needs. This group will be process-oriented, with patients participating in exploration of their own experiences as well as giving and receiving support and challenge from other group members.   Therapeutic Goals: 1. Patient will identify their personal communication style. 2. Patient will identify passive, assertive, and aggressive forms of communication. 3. Patient will identify strategies for developing more effective communication to appropriately meet their needs.      Summary of Patient Progress:   This group has been supplemented with worksheets. Patient was given opportunity to meet with CSW one on one to review worksheets.     Therapeutic Modalities:  Communication Skills Solution Focused Therapy Motivational Interviewing     Krystianna Soth MSW, LCSW Clincal Social Worker  [facility] 

## 2020-12-18 ENCOUNTER — Encounter (HOSPITAL_COMMUNITY): Payer: Self-pay

## 2020-12-18 DIAGNOSIS — F250 Schizoaffective disorder, bipolar type: Secondary | ICD-10-CM | POA: Diagnosis not present

## 2020-12-18 MED ORDER — RISPERIDONE 2 MG PO TBDP
3.0000 mg | ORAL_TABLET | Freq: Every day | ORAL | Status: DC
  Administered 2020-12-19 – 2020-12-26 (×8): 3 mg via ORAL
  Filled 2020-12-18 (×11): qty 1

## 2020-12-18 NOTE — Progress Notes (Signed)
[facility] MD Progress Note  12/18/2020 6:46 PM Noah Ramsey  MRN:  [id number]  Reason for admission: Patient is a 25 year old male with history of schizoaffective disorder admitted for insomnia x 5 days, aggressive behavior, delusions, suicidal ideation and auditory hallucinations.  Objective: Medical record reviewed.  Patient's case discussed in detail with members of the treatment team.  I met with and evaluated the patient on the unit today for follow-up today.  Patient looks better than he did when I last saw him on Friday.  Initially during our conversation his thought processes are more organized and content is more reality based.  He reports that he feels less anxious since starting risperidone and denies auditory hallucinations thus far today.  He reports that he was previously hearing voices that were cursing at him.  He requests an increase in Risperdal.  Patient denies SI, AI, HI, VH.  He describes his mood as improved and "laid back."  As we continue to talk, patient becomes progressively more loose and speaks of more delusional thought content.  He talks about going back in time and learning that antipsychotics have been in laced with embalming fluid to make people who take them go crazy.  He is intrusive and has to be redirected several times when he asked personal questions of me.  He displays fewer staring episodes during our interaction today.  Patient reports that he is eating and sleeping well.  He denies any physical problems or medication side effects.  Staff document that he slept 5.25 hours last night.  No new labs today.  Patient is afebrile.  Most recent vital signs include BP of 121/64 and pulse of 96.  Patient is taking scheduled medications as prescribed.  He has not required any PRN occasions in 48 hours.  Principal Problem: Schizoaffective disorder ([place]) Diagnosis: Principal Problem:   Schizoaffective disorder ([place])  Total Time spent with patient:  25 minutes  Past  Psychiatric History: See admission H&P  Past Medical History:  Past Medical History:  Diagnosis Date   Elevated CPK    per patient   Schizophrenia ([place])    History reviewed. No pertinent surgical history. Family History:  Family History  Problem Relation Age of Onset   Mental illness Brother    Family Psychiatric  History: See admission H&P Social History:  Social History   Substance and Sexual Activity  Alcohol Use Never     Social History   Substance and Sexual Activity  Drug Use Never    Social History   Socioeconomic History   Marital status: Single    Spouse name: Not on file   Number of children: Not on file   Years of education: Not on file   Highest education level: Not on file  Occupational History   Not on file  Tobacco Use   Smoking status: Never   Smokeless tobacco: Never  Substance and Sexual Activity   Alcohol use: Never   Drug use: Never   Sexual activity: Not on file  Other Topics Concern   Not on file  Social History Narrative   Not on file   Social Determinants of Health   Financial Resource Strain: Not on file  Food Insecurity: Not on file  Transportation Needs: Not on file  Physical Activity: Not on file  Stress: Not on file  Social Connections: Not on file   Additional Social History:  Sleep: Fair  Appetite:  Good  Current Medications: Current Facility-Administered Medications  Medication Dose Route Frequency Provider Last Rate Last Admin   acetaminophen (TYLENOL) tablet 650 mg  650 mg Oral Q6H PRN Rankin, Shuvon B, NP   650 mg at 12/14/20 0203   alum & mag hydroxide-simeth (MAALOX/MYLANTA) 200-200-20 MG/5ML suspension 30 mL  30 mL Oral Q4H PRN Rankin, Shuvon B, NP       benztropine (COGENTIN) tablet 0.5 mg  0.5 mg Oral Q6H PRN Arthor Captain, MD       benztropine (COGENTIN) tablet 0.5 mg  0.5 mg Oral BID Arthor Captain, MD   0.5 mg at 12/18/20 0818   diphenhydrAMINE (BENADRYL) injection 50  mg  50 mg Intramuscular Q6H PRN Arthor Captain, MD       hydrOXYzine (ATARAX/VISTARIL) tablet 25 mg  25 mg Oral Q6H PRN Arthor Captain, MD   25 mg at 12/15/20 2046   LORazepam (ATIVAN) injection 2 mg  2 mg Intramuscular BID PRN Arthor Captain, MD       LORazepam (ATIVAN) tablet 1 mg  1 mg Oral Q6H PRN Arthor Captain, MD   1 mg at 12/16/20 2103   magnesium hydroxide (MILK OF MAGNESIA) suspension 30 mL  30 mL Oral Daily PRN Rankin, Shuvon B, NP       metFORMIN (GLUCOPHAGE) tablet 500 mg  500 mg Oral BID WC Rankin, Shuvon B, NP   500 mg at 12/18/20 1755   Oxcarbazepine (TRILEPTAL) tablet 300 mg  300 mg Oral BID Rankin, Shuvon B, NP   300 mg at 12/18/20 1755   propranolol (INDERAL) tablet 40 mg  40 mg Oral Q8H Arthor Captain, MD   40 mg at 12/18/20 1349   risperiDONE (RISPERDAL M-TABS) disintegrating tablet 2 mg  2 mg Oral Daily Arthor Captain, MD   2 mg at 12/18/20 0818   risperiDONE (RISPERDAL M-TABS) disintegrating tablet 3 mg  3 mg Oral QHS Arthor Captain, MD   3 mg at 12/17/20 2105    Lab Results:  No results found for this or any previous visit (from the past 48 hour(s)).    Blood Alcohol level:  Lab Results  Component Value Date   ETH <10 11/29/2020   ETH <10 65/78/4696    Metabolic Disorder Labs: Lab Results  Component Value Date   HGBA1C 5.5 12/04/2020   MPG 111.15 12/04/2020   No results found for: PROLACTIN Lab Results  Component Value Date   CHOL 123 12/02/2020   TRIG 74 12/02/2020   HDL 35 (L) 12/02/2020   CHOLHDL 3.5 12/02/2020   VLDL 15 12/02/2020   LDLCALC 73 12/02/2020   LDLCALC 101 (H) 06/03/2019    Physical Findings: AIMS: Facial and Oral Movements Muscles of Facial Expression: None, normal Lips and Perioral Area: None, normal Jaw: None, normal Tongue: None, normal,Extremity Movements Upper (arms, wrists, hands, fingers): None, normal Lower (legs, knees, ankles, toes): None, normal, Trunk Movements Neck, shoulders, hips: None, normal, Overall  Severity Severity of abnormal movements (highest score from questions above): None, normal Incapacitation due to abnormal movements: None, normal Patient's awareness of abnormal movements (rate only patient's report): No Awareness, Dental Status Current problems with teeth and/or dentures?: No Does patient usually wear dentures?: No  CIWA:    COWS:     Musculoskeletal: Strength & Muscle Tone: within normal limits Gait & Station: normal Patient leans: N/A  Psychiatric Specialty Exam:  Presentation  General Appearance: Appropriate for Environment  Eye Contact:Good  Speech:Normal Rate  Speech Volume:Normal  Handedness:Right   Mood and Affect  Mood:Anxious  Affect:Constricted   Thought Process  Thought Processes:Coherent  Descriptions of Associations:Loose  Orientation:Full (Time, Place and Person)  Thought Content:Paranoid Ideation; Delusions  History of Schizophrenia/Schizoaffective disorder:Yes  Duration of Psychotic Symptoms:Greater than six months  Hallucinations:Hallucinations: Auditory  Ideas of Reference:Paranoia  Suicidal Thoughts:Suicidal Thoughts: No  Homicidal Thoughts:Homicidal Thoughts: No   Sensorium  Memory:Immediate Fair; Recent Fair; Remote Fair  Judgment:Impaired  Insight:Shallow   Executive Functions  Concentration:Fair  Attention Span:Fair  [place]  Language:Good   Psychomotor Activity  Psychomotor Activity:Psychomotor Activity: Normal   Assets  Assets:Communication Skills; Desire for Improvement; Social Support; Housing   Sleep  Sleep:Sleep: Good Number of Hours of Sleep: 5.25    Physical Exam: Physical Exam Vitals and nursing note reviewed.  Constitutional:      General: He is not in acute distress.    Appearance: He is not diaphoretic.  HENT:     Head: Normocephalic and atraumatic.  Cardiovascular:     Rate and Rhythm: Normal rate.  Pulmonary:     Effort: Pulmonary  effort is normal.  Neurological:     General: No focal deficit present.     Mental Status: He is alert and oriented to person, place, and time.   Review of Systems  Constitutional:  Negative for chills, diaphoresis and fever.  HENT:  Negative for sore throat.   Respiratory:  Negative for cough and shortness of breath.   Cardiovascular:  Negative for chest pain and palpitations.  Gastrointestinal:  Negative for constipation, diarrhea, nausea and vomiting.  Musculoskeletal:  Negative for myalgias.  Neurological:  Negative for dizziness, seizures and headaches.  Psychiatric/Behavioral:  Positive for hallucinations. Negative for depression, substance abuse and suicidal ideas. The patient is nervous/anxious. The patient does not have insomnia.   Blood pressure 121/64, pulse 96, temperature 98.3 F (36.8 C), temperature source Oral, resp. rate 16, height 5' 6"  (1.676 m), weight 134.3 kg, SpO2 99 %. Body mass index is 47.78 kg/m.   Treatment Plan Summary: Patient is a 25 year old male with schizoaffective disorder, bipolar type admitted for treatment and stabilization of worsening psychotic and mood symptoms, suicidal ideation and aggressive behavior.  Patient is displaying some improvement in organization of thought processes and is less exclusively focused on delusional themes.  He is calmer, sleeping better and less intrusive.  Overall he continues to display ongoing symptoms of disorganized thought processes, delusional thought content and hallucinations.  He is tolerating upward titration of risperidone.  Daily contact with patient to assess and evaluate symptoms and progress in treatment and Medication management  Schizoaffective disorder -Increase Risperdal M tabs to 3 mg PO QAM and 3 mg PO QHS for psychotic symptoms -Goal is for eventual transition to long-acting injectable antipsychotic  -Continue Trileptal 300 mg twice daily for mood stabilization  Anxiety/EPS -Continue propranolol 40  mg Q8H for restlessness and anxiety -Continue benztropine 0.5 mg PO twice daily   -Continue benztropine 0.5 mg PO Q6H PRN for tremors, muscle stiffness -Continue diphenhydramine 50 mg IM every 6 hours as needed for acute dystonic reaction  Diabetes Mellitus -Continue metformin 500 mg twice daily with meals  Agitation  -Continue lorazepam 1 mg PO Q6H PRN anxiety, restlessness, agitation -Continue lorazepam 2 mg IM BID PRN for severe agitation only if patient unable to take oral PRN medications for agitation  Anxiety -Continue hydroxyzine 25 mg Q6H PRN  Discharge planning in progress  Arthor Captain, MD 12/18/2020, 6:46 PM

## 2020-12-18 NOTE — [facility] Group Notes (Signed)
The focus of this group is to help patients establish daily goals to achieve during treatment and discuss how the patient can incorporate goal setting into their daily lives to aide in recovery.  Pt did not attend group 

## 2020-12-18 NOTE — Progress Notes (Signed)
Adult Psychoeducational Group Note  Date:  12/18/2020 Time:  10:43 PM  Group Topic/Focus:  Wrap-Up Group:   The focus of this group is to help patients review their daily goal of treatment and discuss progress on daily workbooks.  Participation Level:  Minimal  Participation Quality:  Appropriate  Affect:  Anxious  Cognitive:  Disorganized  Insight: Limited  Engagement in Group:  Limited  Modes of Intervention:  Discussion  Additional Comments:  Pt stated his goal for today was to focus on his treatment plan. Pt stated he accomplished his goal today. Pt stated he talked with his doctor but did not get a chance to talk with his social worker about his care today. Pt rated his overall day a 10. Pt stated he really enjoyed going outside and playing kick ball with his peers today.  Pt stated he was able to contact his mother today which improved his overall day.  Pt stated he felt better about himself today. Pt stated he was able to attend all meals. Pt stated he took all medications provided today. Pt stated he did not attend any groups today. Pt stated his appetite was pretty good today. Pt rated sleep last night was pretty good. Pt stated the goal tonight was to get some rest. Pt stated he had no physical pain today. Pt deny visual hallucinations but admitted to dealing with some auditory issues tonight. Pt nurses was updated on situation. Pt denies thoughts of harming himself or others. Pt stated he would alert staff if anything changed.  [name] 12/18/2020, 10:43 PM

## 2020-12-18 NOTE — BH IP Treatment Plan (Signed)
Interdisciplinary Treatment and Diagnostic Plan Update  12/18/2020 Time of Session:  Nobel Brar MRN: [id number]  Principal Diagnosis: Schizoaffective disorder [facility])  Secondary Diagnoses: Principal Problem:   Schizoaffective disorder (HCC)   Current Medications:  Current Facility-Administered Medications  Medication Dose Route Frequency Provider Last Rate Last Admin   acetaminophen (TYLENOL) tablet 650 mg  650 mg Oral Q6H PRN Rankin, Shuvon B, NP   650 mg at 12/14/20 0203   alum & mag hydroxide-simeth (MAALOX/MYLANTA) 200-200-20 MG/5ML suspension 30 mL  30 mL Oral Q4H PRN Rankin, Shuvon B, NP       benztropine (COGENTIN) tablet 0.5 mg  0.5 mg Oral Q6H PRN Claudie Revering, MD       benztropine (COGENTIN) tablet 0.5 mg  0.5 mg Oral BID Claudie Revering, MD   0.5 mg at 12/18/20 0818   diphenhydrAMINE (BENADRYL) injection 50 mg  50 mg Intramuscular Q6H PRN Claudie Revering, MD       hydrOXYzine (ATARAX/VISTARIL) tablet 25 mg  25 mg Oral Q6H PRN Claudie Revering, MD   25 mg at 12/15/20 2046   LORazepam (ATIVAN) injection 2 mg  2 mg Intramuscular BID PRN Claudie Revering, MD       LORazepam (ATIVAN) tablet 1 mg  1 mg Oral Q6H PRN Claudie Revering, MD   1 mg at 12/16/20 2103   magnesium hydroxide (MILK OF MAGNESIA) suspension 30 mL  30 mL Oral Daily PRN Rankin, Shuvon B, NP       metFORMIN (GLUCOPHAGE) tablet 500 mg  500 mg Oral BID WC Rankin, Shuvon B, NP   500 mg at 12/18/20 0818   Oxcarbazepine (TRILEPTAL) tablet 300 mg  300 mg Oral BID Rankin, Shuvon B, NP   300 mg at 12/18/20 0818   propranolol (INDERAL) tablet 40 mg  40 mg Oral Q8H Claudie Revering, MD   40 mg at 12/18/20 5462   risperiDONE (RISPERDAL M-TABS) disintegrating tablet 2 mg  2 mg Oral Daily Claudie Revering, MD   2 mg at 12/18/20 0818   risperiDONE (RISPERDAL M-TABS) disintegrating tablet 3 mg  3 mg Oral QHS Claudie Revering, MD   3 mg at 12/17/20 2105   PTA Medications: Medications Prior to Admission  Medication Sig Dispense  Refill Last Dose   diazepam (VALIUM) 5 MG tablet Take 5 mg by mouth 2 (two) times daily as needed for anxiety.      haloperidol (HALDOL) 10 MG tablet Take 10 mg by mouth at bedtime. (Patient not taking: No sig reported)      haloperidol decanoate (HALDOL DECANOATE) 100 MG/ML injection Inject 100 mg into the muscle every 28 (twenty-eight) days. (Patient not taking: No sig reported)      LATUDA 40 MG TABS tablet Take 40 mg by mouth at bedtime.      metFORMIN (GLUCOPHAGE) 500 MG tablet Take 1 tablet (500 mg total) by mouth 2 (two) times daily with a meal. (Patient not taking: No sig reported) 180 tablet 3    OVER THE COUNTER MEDICATION Ripped Fat Burner (diet pill) (Patient not taking: Reported on 12/06/2020)   Not Taking   OVER THE COUNTER MEDICATION Yohimbine (diet pill) (Patient not taking: Reported on 12/06/2020)   Not Taking   propranolol (INDERAL) 40 MG tablet Take 40 mg by mouth 3 (three) times daily.       Patient Stressors: Financial difficulties Medication change or noncompliance  Patient Strengths: Manufacturing systems engineer Supportive family/friends  Treatment Modalities: Medication Management, Group  therapy, Case management,  1 to 1 session with clinician, Psychoeducation, Recreational therapy.   Physician Treatment Plan for Primary Diagnosis: Schizoaffective disorder (HCC) Long Term Goal(s): Improvement in symptoms so as ready for discharge   Short Term Goals: Ability to identify changes in lifestyle to reduce recurrence of condition will improve Ability to verbalize feelings will improve Ability to disclose and discuss suicidal ideas Ability to demonstrate self-control will improve Ability to identify and develop effective coping behaviors will improve Ability to maintain clinical measurements within normal limits will improve Compliance with prescribed medications will improve  Medication Management: Evaluate patient's response, side effects, and tolerance of medication  regimen.  Therapeutic Interventions: 1 to 1 sessions, Unit Group sessions and Medication administration.  Evaluation of Outcomes: Progressing  Physician Treatment Plan for Secondary Diagnosis: Principal Problem:   Schizoaffective disorder (HCC)  Long Term Goal(s): Improvement in symptoms so as ready for discharge   Short Term Goals: Ability to identify changes in lifestyle to reduce recurrence of condition will improve Ability to verbalize feelings will improve Ability to disclose and discuss suicidal ideas Ability to demonstrate self-control will improve Ability to identify and develop effective coping behaviors will improve Ability to maintain clinical measurements within normal limits will improve Compliance with prescribed medications will improve     Medication Management: Evaluate patient's response, side effects, and tolerance of medication regimen.  Therapeutic Interventions: 1 to 1 sessions, Unit Group sessions and Medication administration.  Evaluation of Outcomes: Progressing   RN Treatment Plan for Primary Diagnosis: Schizoaffective disorder (HCC) Long Term Goal(s): Knowledge of disease and therapeutic regimen to maintain health will improve  Short Term Goals: Ability to verbalize frustration and anger appropriately will improve, Ability to demonstrate self-control, and Ability to participate in decision making will improve  Medication Management: RN will administer medications as ordered by provider, will assess and evaluate patient's response and provide education to patient for prescribed medication. RN will report any adverse and/or side effects to prescribing provider.  Therapeutic Interventions: 1 on 1 counseling sessions, Psychoeducation, Medication administration, Evaluate responses to treatment, Monitor vital signs and CBGs as ordered, Perform/monitor CIWA, COWS, AIMS and Fall Risk screenings as ordered, Perform wound care treatments as ordered.  Evaluation of  Outcomes: Progressing   LCSW Treatment Plan for Primary Diagnosis: Schizoaffective disorder (HCC) Long Term Goal(s): Safe transition to appropriate next level of care at discharge, Engage patient in therapeutic group addressing interpersonal concerns.  Short Term Goals: Engage patient in aftercare planning with referrals and resources, Increase social support, and Increase ability to appropriately verbalize feelings  Therapeutic Interventions: Assess for all discharge needs, 1 to 1 time with Social worker, Explore available resources and support systems, Assess for adequacy in community support network, Educate family and significant other(s) on suicide prevention, Complete Psychosocial Assessment, Interpersonal group therapy.  Evaluation of Outcomes: Progressing   Progress in Treatment: Attending groups: No. Participating in groups: No. Taking medication as prescribed: Yes. Toleration medication: Yes. Family/Significant other contact made: Yes, individual(s) contacted:  mother Patient understands diagnosis: Yes. Discussing patient identified problems/goals with staff: Yes. Medical problems stabilized or resolved: Yes. Denies suicidal/homicidal ideation: Yes. Issues/concerns per patient self-inventory: Yes. Other: None  New problem(s) identified: No, Describe:  None  New Short Term/Long Term Goal(s):medication stabilization, elimination of SI thoughts, development of comprehensive mental wellness plan.   Patient Goals:  Did Not Attend  Discharge Plan or Barriers: Patient recently admitted. CSW will continue to follow and assess for appropriate referrals and possible discharge planning.  Reason for Continuation of Hospitalization: Delusions  Hallucinations Medication stabilization  Estimated Length of Stay: 3-5 days   Scribe for Treatment Team: Chrys Racer 12/18/2020 11:49 AM

## 2020-12-18 NOTE — Progress Notes (Signed)
   12/18/20 1000  Psych Admission Type (Psych Patients Only)  Admission Status Involuntary  Psychosocial Assessment  Patient Complaints None  Eye Contact Brief  Facial Expression Fixed smile  Affect Appropriate to circumstance  Speech Soft  Interaction Assertive  Motor Activity Slow  Appearance/Hygiene Unremarkable  Behavior Characteristics Cooperative  Mood Preoccupied  Thought Process  Coherency WDL  Content Preoccupation  Delusions None reported or observed  Perception Hallucinations  Hallucination Auditory  Judgment Poor  Confusion None  Danger to Self  Current suicidal ideation? Denies  Danger to Others  Danger to Others None reported or observed

## 2020-12-18 NOTE — [facility] Group Notes (Signed)
CSW was unable to hold group today due to late discharges and acuity of the unit.   [name], LCSWA Clinicial Social Worker  Health 

## 2020-12-18 NOTE — Progress Notes (Signed)
Recreation Therapy Notes  Date: 8.29.22 Time: 1000 Location: 500 Hall Dayroom  Group Topic: Coping Skills  Goal Area(s) Addresses:  Patient will identify difference between healthy and unhealthy coping strategies. Patient will identify benefit of using healthy coping strategies.  Behavioral Response: None  Intervention: Worksheet  Activity:  Healthy vs. Unhealthy Coping Strategies.  Patients were to identify a current problem they are dealing with.  Identify the unhealthy coping strategies they have used and the consequences.  Patients then identified healthy coping strategies, benefits and barriers.  Education: Pharmacologist, Building control surveyor.   Education Outcome: Acknowledges understanding/In group clarification offered/Needs additional education.   Clinical Observations/Feedback: Pt sat quietly during group.  Pt did not participate stating he had dementia and couldn't read.     Caroll Rancher, LRT/CTRS      Caroll Rancher A 12/18/2020 11:41 AM

## 2020-12-19 DIAGNOSIS — F250 Schizoaffective disorder, bipolar type: Secondary | ICD-10-CM | POA: Diagnosis not present

## 2020-12-19 MED ORDER — POLYETHYLENE GLYCOL 3350 17 G PO PACK
17.0000 g | PACK | Freq: Every day | ORAL | Status: DC
  Administered 2020-12-19 – 2021-01-05 (×14): 17 g via ORAL
  Filled 2020-12-19 (×20): qty 1

## 2020-12-19 MED ORDER — DOXEPIN HCL 10 MG PO CAPS
10.0000 mg | ORAL_CAPSULE | Freq: Every evening | ORAL | Status: DC | PRN
  Administered 2020-12-22: 10 mg via ORAL
  Filled 2020-12-19: qty 1

## 2020-12-19 MED ORDER — GABAPENTIN 300 MG PO CAPS
300.0000 mg | ORAL_CAPSULE | Freq: Every day | ORAL | Status: DC
  Administered 2020-12-19 – 2021-01-04 (×17): 300 mg via ORAL
  Filled 2020-12-19 (×21): qty 1

## 2020-12-19 NOTE — [facility] Group Notes (Signed)
Adult Psychoeducational Group Note  Date:  12/19/2020 Time:  8:16 PM  Group Topic/Focus:  Building Self Esteem:   The Focus of this group is helping patients become aware of the effects of self-esteem on their lives, the things they and others do that enhance or undermine their self-esteem, seeing the relationship between their level of self-esteem and the choices they make and learning ways to enhance self-esteem.  Participation Level:  Minimal  Participation Quality:  Attentive  Affect:  Appropriate  Cognitive:  Appropriate  Insight: Good  Engagement in Group:  Engaged  Modes of Intervention:  Discussion  Additional Comments  Jacalyn Lefevre 12/19/2020, 8:16 PM

## 2020-12-19 NOTE — Progress Notes (Signed)
Pt up pacing the room talking to people not seen

## 2020-12-19 NOTE — Progress Notes (Signed)
[facility] MD Progress Note  12/19/2020 5:07 PM Noah Ramsey  MRN:  [id number]  Reason for admission: Patient is a 25 year old male with history of schizoaffective disorder admitted for insomnia x 5 days, aggressive behavior, delusions, suicidal ideation and auditory hallucinations.  Objective: Medical record reviewed.  Patient's case discussed in detail with members of the treatment team.  I met with and evaluated the patient on the unit today for follow-up today.  Patient looks the same as yesterday.  He describes his mood as "laid back."  He denies depressed mood, anhedonia or significant anxiety.  Patient denies passive wish for death, SI, AI or HI.  He continues to be tangential and frequently changes the subject to talk about grandiose and paranoid delusional themes (being a Chief Operating Officer, being the [place], his ability to time travel, controlling the destiny of others, owning the [employer], etc.).  Patient repeatedly requests weight loss medications and is focused on losing weight, etc.  It is difficult to engage him in conversation regarding his psychiatric symptoms, treatment and antipsychotic medications due to his desire to request and obtain supplements or medications to assist with weight loss and building muscle.  I advised him to allow [place] to start him on [place] long-acting injectable medication.  Patient declines long-acting injectable antipsychotic medications and states that he does not need it.  Patient continues to make statements that antipsychotics have been laced with embalming fluid.  Patient reports constipation but denies other medication side effects or physical problems.  Patient reports that he slept 8 hours last night.  Staff recorded that he slept only 2 hours last night.  Patient has been intermittently hypertensive today with systolic as high as 562 and diastolic as high as 130.  Most recent vital signs include BP of 152/66 and pulse of 90.  He is afebrile and O2 sat is  100% on room air.  Patient is taking scheduled medications as prescribed.  He received PRN lorazepam 1 mg at 9 PM last night and did not receive any other PRN medications.  Staff document the patient has been intermittently pacing and talking to himself in response to internal stimuli.  Principal Problem: Schizoaffective disorder ([place]) Diagnosis: Principal Problem:   Schizoaffective disorder ([place])  Total Time spent with patient:  25 minutes  Past Psychiatric History: See admission H&P  Past Medical History:  Past Medical History:  Diagnosis Date   Elevated CPK    per patient   Schizophrenia ([place])    History reviewed. No pertinent surgical history. Family History:  Family History  Problem Relation Age of Onset   Mental illness Brother    Family Psychiatric  History: See admission H&P Social History:  Social History   Substance and Sexual Activity  Alcohol Use Never     Social History   Substance and Sexual Activity  Drug Use Never    Social History   Socioeconomic History   Marital status: Single    Spouse name: Not on file   Number of children: Not on file   Years of education: Not on file   Highest education level: Not on file  Occupational History   Not on file  Tobacco Use   Smoking status: Never   Smokeless tobacco: Never  Substance and Sexual Activity   Alcohol use: Never   Drug use: Never   Sexual activity: Not on file  Other Topics Concern   Not on file  Social History Narrative   Not on file   Social Determinants  of Health   Financial Resource Strain: Not on file  Food Insecurity: Not on file  Transportation Needs: Not on file  Physical Activity: Not on file  Stress: Not on file  Social Connections: Not on file   Additional Social History:                         Sleep: Poor  Appetite:  Good  Current Medications: Current Facility-Administered Medications  Medication Dose Route Frequency Provider Last Rate Last Admin    acetaminophen (TYLENOL) tablet 650 mg  650 mg Oral Q6H PRN Rankin, Shuvon B, NP   650 mg at 12/14/20 0203   alum & mag hydroxide-simeth (MAALOX/MYLANTA) 200-200-20 MG/5ML suspension 30 mL  30 mL Oral Q4H PRN Rankin, Shuvon B, NP       benztropine (COGENTIN) tablet 0.5 mg  0.5 mg Oral Q6H PRN Arthor Captain, MD       benztropine (COGENTIN) tablet 0.5 mg  0.5 mg Oral BID Arthor Captain, MD   0.5 mg at 12/19/20 0947   diphenhydrAMINE (BENADRYL) injection 50 mg  50 mg Intramuscular Q6H PRN Arthor Captain, MD       hydrOXYzine (ATARAX/VISTARIL) tablet 25 mg  25 mg Oral Q6H PRN Arthor Captain, MD   25 mg at 12/15/20 2046   LORazepam (ATIVAN) injection 2 mg  2 mg Intramuscular BID PRN Arthor Captain, MD       LORazepam (ATIVAN) tablet 1 mg  1 mg Oral Q6H PRN Arthor Captain, MD   1 mg at 12/16/20 2103   magnesium hydroxide (MILK OF MAGNESIA) suspension 30 mL  30 mL Oral Daily PRN Rankin, Shuvon B, NP       metFORMIN (GLUCOPHAGE) tablet 500 mg  500 mg Oral BID WC Rankin, Shuvon B, NP   500 mg at 12/19/20 1650   Oxcarbazepine (TRILEPTAL) tablet 300 mg  300 mg Oral BID Rankin, Shuvon B, NP   300 mg at 12/19/20 1650   polyethylene glycol (MIRALAX / GLYCOLAX) packet 17 g  17 g Oral Daily Arthor Captain, MD   17 g at 12/19/20 1447   propranolol (INDERAL) tablet 40 mg  40 mg Oral Q8H Arthor Captain, MD   40 mg at 12/19/20 1456   risperiDONE (RISPERDAL M-TABS) disintegrating tablet 3 mg  3 mg Oral QHS Arthor Captain, MD   3 mg at 12/18/20 2057   risperiDONE (RISPERDAL M-TABS) disintegrating tablet 3 mg  3 mg Oral Daily Arthor Captain, MD   3 mg at 12/19/20 6962    Lab Results:  No results found for this or any previous visit (from the past 40 hour(s)).    Blood Alcohol level:  Lab Results  Component Value Date   ETH <10 11/29/2020   ETH <10 95/28/4132    Metabolic Disorder Labs: Lab Results  Component Value Date   HGBA1C 5.5 12/04/2020   MPG 111.15 12/04/2020   No results found for:  PROLACTIN Lab Results  Component Value Date   CHOL 123 12/02/2020   TRIG 74 12/02/2020   HDL 35 (L) 12/02/2020   CHOLHDL 3.5 12/02/2020   VLDL 15 12/02/2020   LDLCALC 73 12/02/2020   LDLCALC 101 (H) 06/03/2019    Physical Findings: AIMS: Facial and Oral Movements Muscles of Facial Expression: None, normal Lips and Perioral Area: None, normal Jaw: None, normal Tongue: None, normal,Extremity Movements Upper (arms, wrists, hands, fingers): None, normal Lower (legs, knees,  ankles, toes): None, normal, Trunk Movements Neck, shoulders, hips: None, normal, Overall Severity Severity of abnormal movements (highest score from questions above): None, normal Incapacitation due to abnormal movements: None, normal Patient's awareness of abnormal movements (rate only patient's report): No Awareness, Dental Status Current problems with teeth and/or dentures?: No Does patient usually wear dentures?: No  CIWA:    COWS:     Musculoskeletal: Strength & Muscle Tone: within normal limits Gait & Station: normal Patient leans: N/A  Psychiatric Specialty Exam:  Presentation  General Appearance: Appropriate for Environment; Other (comment) (Unkempt; suboptimal hygiene)  Eye Contact:Good  Speech:Clear and Coherent; Normal Rate  Speech Volume:Normal  Handedness:Right   Mood and Affect  Mood:Anxious  Affect:Constricted   Thought Process  Thought Processes:Coherent  Descriptions of Associations:Tangential  Orientation:Full (Time, Place and Person)  Thought Content:Delusions; Paranoid Ideation  History of Schizophrenia/Schizoaffective disorder:Yes  Duration of Psychotic Symptoms:Greater than six months  Hallucinations:Hallucinations: Auditory  Ideas of Reference:Paranoia  Suicidal Thoughts:Suicidal Thoughts: No  Homicidal Thoughts:Homicidal Thoughts: No   Sensorium  Memory:Immediate Fair; Recent Fair  Judgment:Impaired  Insight:Shallow   Executive Functions   Concentration:Fair  Attention Span:Fair  [place]  Language:Good   Psychomotor Activity  Psychomotor Activity:Psychomotor Activity: Normal   Assets  Assets:Communication Skills; Social Support; Housing   Sleep  Sleep:Sleep: Poor Number of Hours of Sleep: 5.25    Physical Exam: Physical Exam Vitals and nursing note reviewed.  Constitutional:      General: He is not in acute distress.    Appearance: He is not diaphoretic.  HENT:     Head: Normocephalic and atraumatic.  Cardiovascular:     Rate and Rhythm: Normal rate.  Pulmonary:     Effort: Pulmonary effort is normal.  Neurological:     General: No focal deficit present.     Mental Status: He is alert and oriented to person, place, and time.   Review of Systems  Constitutional:  Negative for chills, diaphoresis and fever.  HENT:  Negative for sore throat.   Respiratory:  Negative for cough and shortness of breath.   Cardiovascular:  Negative for chest pain and palpitations.  Gastrointestinal:  Negative for constipation, diarrhea, nausea and vomiting.  Musculoskeletal:  Negative for myalgias.  Neurological:  Negative for dizziness, seizures and headaches.  Psychiatric/Behavioral:  Positive for hallucinations. Negative for depression, substance abuse and suicidal ideas. The patient is nervous/anxious. The patient does not have insomnia.   Blood pressure (!) 152/66, pulse 90, temperature 97.7 F (36.5 C), temperature source Oral, resp. rate 16, height 5' 6"  (1.676 m), weight 134.3 kg, SpO2 98 %. Body mass index is 47.78 kg/m.   Treatment Plan Summary: Patient is a 25 year old male with schizoaffective disorder, bipolar type admitted for treatment and stabilization of worsening psychotic and mood symptoms, suicidal ideation and aggressive behavior.  Patient is displaying some improvement in organization of thought processes and is less exclusively focused on delusional themes.  He is  calmer, sleeping better and less intrusive.  Overall he continues to display ongoing symptoms of disorganized thought processes, delusional thought content and hallucinations.  He is tolerating treatment with risperidone.  Daily contact with patient to assess and evaluate symptoms and progress in treatment and Medication management  Schizoaffective disorder -Continue Risperdal M tabs 3 mg PO QAM and 3 mg PO QHS for psychotic symptoms -Goal is for eventual transition to long-acting injectable antipsychotic.  I have advised patient to allow administration of Invega Sust[place] long-acting injectable antipsychotic loading doses in  the hospital.  Patient declines Kirt Boys treatment today. -Continue Trileptal 300 mg twice daily for mood stabilization  Anxiety/EPS -Continue propranolol 40 mg Q8H for restlessness and anxiety -Discontinue standing dose benztropine 0.5 mg PO twice daily due to constipation and no current EPS -Continue benztropine 0.5 mg PO Q6H PRN for tremors, muscle stiffness -Continue diphenhydramine 50 mg IM every 6 hours as needed for acute dystonic reaction  Diabetes Mellitus -Continue metformin 500 mg twice daily with meals  Agitation  -Continue lorazepam 1 mg PO Q6H PRN anxiety, restlessness, agitation -Continue lorazepam 2 mg IM BID PRN for severe agitation only if patient unable to take oral PRN medications for agitation  Anxiety -Continue hydroxyzine 25 mg Q6H PRN  Insomnia -Start gabapentin 300 mg at bedtime for restlessness  -Start doxepin 10 mg at bedtime as needed for sleep.  Constipation -Start MiraLAX 17 g daily -Continue MOM 30 mL daily PRN -Encourage intake of p.o. fluids  Discharge planning in progress.  Social work has encouraged family to petition for guardian to be appointed for patient.  Housing plan needs to be clarified.  Arthor Captain, MD 12/19/2020, 5:07 PM

## 2020-12-19 NOTE — Progress Notes (Signed)
Recreation Therapy Notes  Date: 8.30.22 Time: 1000 Location: 500 Hall Dayroom  Group Topic: Communication, Team Building, Problem Solving  Goal Area(s) Addresses:  Patient will effectively work with peer towards shared goal.  Patient will identify skill used to make activity successful.  Patient will identify how skills used during activity can be used to reach post d/c goals.  Behavioral Response: None  Intervention: Cup Merchant navy officer bands with attached strings enough for each group member, 10 or more cups  Activity: Patient(s) were given a set of solo cups, a rubber band, and some tied strings. The objective is to build a pyramid with the cups by only using the rubber band and string to move the cups. After the activity the patient(s) are LRT debriefed and discussed what strategies worked, what didn't, and what lessons they can take from the activity and use in life post discharge.   Education Areas: Social Skills, Support System, Discharge Planning   Education Outcome: Acknowledges education  Clinical Observations/Feedback: Patient continues to [employer] say he has all these medical conditions such as dementia which prevents him from participating in activities.  Even with encouragement, patient has an excuse.    Caroll Rancher, LRT/CTRS         Caroll Rancher A 12/19/2020 11:37 AM

## 2020-12-19 NOTE — B[facility] Counselor (Signed)
CSW spoke with this pt's mother who stated she had the guardianship paperwork, although she has not yet completed the paperwork. CSW encouraged mother to file for emergent guardianship and discussed recommendations for this pt getting an LAI, however he is currently declining this.     Ruthann Cancer MSW, LCSW Clincal Social Worker  [facility]

## 2020-12-19 NOTE — Progress Notes (Signed)
Pt stated " I don't need antipsychotics they are laced with embalming fluid , they make me manic" pt continues to be delusional at times    12/19/20 0000  Psych Admission Type (Psych Patients Only)  Admission Status Involuntary  Psychosocial Assessment  Patient Complaints Suspiciousness  Eye Contact Brief  Facial Expression Fixed smile  Affect Appropriate to circumstance  Speech Soft  Interaction Assertive  Motor Activity Slow  Appearance/Hygiene Unremarkable  Behavior Characteristics Cooperative  Mood Suspicious;Preoccupied  Thought Process  Coherency WDL  Content Preoccupation  Delusions None reported or observed  Perception Hallucinations  Hallucination Auditory  Judgment Poor  Confusion None  Danger to Self  Current suicidal ideation? Denies  Danger to Others  Danger to Others None reported or observed

## 2020-12-19 NOTE — Progress Notes (Signed)
Pt calm and cooperative this shift.  Pt presents with flat affect and tangential speech.  Pt is delusional and paranoid. Pt's blood pressure fluctuated throughout the day - last check was 152/66.  Pt remains safe on the unit with q 15 min checks in place.

## 2020-12-20 ENCOUNTER — Other Ambulatory Visit (HOSPITAL_COMMUNITY): Payer: Self-pay

## 2020-12-20 DIAGNOSIS — F250 Schizoaffective disorder, bipolar type: Secondary | ICD-10-CM | POA: Diagnosis not present

## 2020-12-20 MED ORDER — INVEGA SUSTENNA 156 MG/ML IM SUSY
156.0000 mg | PREFILLED_SYRINGE | INTRAMUSCULAR | 0 refills | Status: DC
  Filled 2020-12-20: qty 1, 30d supply, fill #0

## 2020-12-20 MED ORDER — PALIPERIDONE PALMITATE ER 234 MG/1.5ML IM SUSY
234.0000 mg | PREFILLED_SYRINGE | Freq: Once | INTRAMUSCULAR | Status: AC
  Administered 2020-12-20: 234 mg via INTRAMUSCULAR
  Filled 2020-12-20: qty 1.5

## 2020-12-20 MED ORDER — PALIPERIDONE PALMITATE ER 156 MG/ML IM SUSY
156.0000 mg | PREFILLED_SYRINGE | INTRAMUSCULAR | Status: DC
  Administered 2020-12-24: 156 mg via INTRAMUSCULAR

## 2020-12-20 MED ORDER — PALIPERIDONE PALMITATE ER 234 MG/1.5ML IM SUSY
234.0000 mg | PREFILLED_SYRINGE | INTRAMUSCULAR | 0 refills | Status: DC
  Filled 2020-12-20: qty 1.5, 30d supply, fill #0

## 2020-12-20 NOTE — [facility] Group Notes (Signed)
[facility] Group Notes:  (Nursing)  Date:  12/20/2020  Time: 1415  Type of Therapy:  Group Therapy  Participation Level:  Active  Participation Quality:  Attentive  Affect:  Blunted  Cognitive:  Alert and Appropriate  Insight:  Improving  Engagement in Group:  Engaged  Modes of Intervention:  Discussion, Exploration, Rapport Building, and Socialization  Summary of Progress/Problems:  Group played a non-competitive learning/communication card game that fosters listening skills as well as self expression  Shela Nevin 12/20/2020, 6:42 PM

## 2020-12-20 NOTE — [facility] Group Notes (Signed)
Topic:    Due to the acuity and Covid-19 precautions, group was not held. Patient was provided therapeutic worksheets and asked to meet with CSW as needed.  Evert Wenrich, LCSWA Clinicial Social Worker [facility]  

## 2020-12-20 NOTE — Progress Notes (Signed)
Pt continues to respond to internal stimuli on the unit    12/20/20 0100  Psych Admission Type (Psych Patients Only)  Admission Status Involuntary  Psychosocial Assessment  Patient Complaints Suspiciousness  Eye Contact Brief  Facial Expression Fixed smile  Affect Appropriate to circumstance  Speech Soft  Interaction Assertive  Motor Activity Slow  Appearance/Hygiene Unremarkable  Behavior Characteristics Cooperative  Mood Suspicious;Preoccupied  Thought Process  Coherency WDL  Content Preoccupation  Delusions None reported or observed  Perception Hallucinations  Hallucination Auditory  Judgment Poor  Confusion None  Danger to Self  Current suicidal ideation? Denies  Danger to Others  Danger to Others None reported or observed

## 2020-12-20 NOTE — Progress Notes (Signed)
Pt visible in the dayroom much of the evening. Pt continues to be delusional but pleasant. Pt given PRN Vistaril with HS medication    12/20/20 2100  Psych Admission Type (Psych Patients Only)  Admission Status Involuntary  Psychosocial Assessment  Patient Complaints Anxiety;Suspiciousness  Eye Contact Brief  Facial Expression Fixed smile  Affect Appropriate to circumstance  Speech Soft  Interaction Assertive  Motor Activity Slow  Appearance/Hygiene Unremarkable  Behavior Characteristics Cooperative  Mood Suspicious;Preoccupied  Thought Process  Coherency WDL  Content Preoccupation  Delusions None reported or observed  Perception Hallucinations  Hallucination None reported or observed  Judgment Poor  Confusion None  Danger to Self  Current suicidal ideation? Denies  Danger to Others  Danger to Others None reported or observed

## 2020-12-20 NOTE — [facility] Group Notes (Signed)
Adult Psychoeducational Group Note  Date:  12/20/2020 Time:  9:28 PM  Group Topic/Focus:  Making Healthy Choices:   The focus of this group is to help patients identify negative/unhealthy choices they were using prior to admission and identify positive/healthier coping strategies to replace them upon discharge.  Participation Level:  Active  Participation Quality:  Attentive  Affect:  Appropriate  Cognitive:  Alert  Insight: Good  Engagement in Group:  Engaged  Modes of Intervention:  Discussion  Additional Comments   Jacalyn Lefevre 12/20/2020, 9:28 PM

## 2020-12-20 NOTE — Progress Notes (Addendum)
   12/20/20 1200  Psych Admission Type (Psych Patients Only)  Admission Status Involuntary  Psychosocial Assessment  Patient Complaints Anxiety  Eye Contact Brief  Facial Expression Fixed smile  Affect Appropriate to circumstance  Speech Soft  Interaction Assertive  Motor Activity Slow  Appearance/Hygiene Unremarkable  Behavior Characteristics Cooperative  Mood Preoccupied;Pleasant  Thought Process  Coherency WDL  Content Preoccupation  Delusions None reported or observed  Perception Hallucinations  Hallucination None reported or observed  Judgment Poor  Confusion None  Danger to Self  Current suicidal ideation? Denies  Danger to Others  Danger to Others None reported or observed   D. Pt presents with a constricted affect, anxious mood, calm, cooperative behavior. 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 Pt currently denies SI/HI and AVH and does not appear to be responding to internal stimuli. A. Labs and vitals monitored. Pt given and educated on medications. Pt supported emotionally and encouraged to express concerns and ask questions.   R. Pt remains safe with 15 minute checks. Will continue POC.

## 2020-12-20 NOTE — [facility] Group Notes (Signed)
The focus of this group is to help patients establish daily goals to achieve during treatment and discuss how the patient can incorporate goal setting into their daily lives to aide in recovery.  Pt did not attend group 

## 2020-12-20 NOTE — Progress Notes (Signed)
[facility] MD Progress Note  12/20/2020 6:33 PM Coley Kulikowski  MRN:  [id number]  Reason for admission: Patient is a 25 year old male with history of schizoaffective disorder admitted for insomnia x 5 days, aggressive behavior, delusions, suicidal ideation and auditory hallucinations.  Objective: Medical record reviewed.  Patient's case discussed in detail with members of the treatment team.  I met with and evaluated the patient on the unit today for follow-up today.  Patient is similar to yesterday.  He continues to speak of grandiose and paranoid delusional themes and is focused on physical concerns about weight loss and building muscle.  Patient is still tangential but it is easier to redirect him today to answer questions.  He is less intrusive.  During our conversation patient smiles and laughs to himself on 1 occasion in response to internal stimuli.  Patient denies SI, AI, HI, VH.  He states that he is eating and sleeping well.  Patient denies any medication side effects.  I discussed changing from oral risperidone to [place] today.  Patient is receptive to receiving Kirt Boys injection but prefers to receive it in his gluteal muscles so that it will not interfere with doing push-ups.  He denies tongue thickness, trouble swallowing, trouble speaking, trouble breathing, muscle spasms, muscle stiffness, restlessness, tremor or other medication side effects.  Patient denies sore throat, cough, headache, myalgias, nausea, vomiting or other physical problems.  He reports that he had a bowel movement yesterday.  Staff document that patient slept 7.25 hours last night.  He is taking scheduled medications as prescribed except he refused his evening dose of metformin.  He received the first loading dose of Invega Sustenna LAI this afternoon.  Patient has not required any PRN medications in the last 24 hours.  Labs this morning include BP of 106/78 sitting and 148/107 standing; pulse 82 sitting and  93 standing; O2 sat of 100% on room air and temperature of 97.7.  Staff report that patient has continued to intermittently delusional statements and respond to internal stimuli on the unit.    Principal Problem: Schizoaffective disorder ([place]) Diagnosis: Principal Problem:   Schizoaffective disorder ([place])  Total Time spent with patient:  25 minutes  Past Psychiatric History: See admission H&P  Past Medical History:  Past Medical History:  Diagnosis Date   Elevated CPK    per patient   Schizophrenia ([place])    History reviewed. No pertinent surgical history. Family History:  Family History  Problem Relation Age of Onset   Mental illness Brother    Family Psychiatric  History: See admission H&P Social History:  Social History   Substance and Sexual Activity  Alcohol Use Never     Social History   Substance and Sexual Activity  Drug Use Never    Social History   Socioeconomic History   Marital status: Single    Spouse name: Not on file   Number of children: Not on file   Years of education: Not on file   Highest education level: Not on file  Occupational History   Not on file  Tobacco Use   Smoking status: Never   Smokeless tobacco: Never  Substance and Sexual Activity   Alcohol use: Never   Drug use: Never   Sexual activity: Not on file  Other Topics Concern   Not on file  Social History Narrative   Not on file   Social Determinants of Health   Financial Resource Strain: Not on file  Food Insecurity: Not on  file  Transportation Needs: Not on file  Physical Activity: Not on file  Stress: Not on file  Social Connections: Not on file   Additional Social History:                         Sleep: Good  Appetite:  Good  Current Medications: Current Facility-Administered Medications  Medication Dose Route Frequency Provider Last Rate Last Admin   acetaminophen (TYLENOL) tablet 650 mg  650 mg Oral Q6H PRN Rankin, Shuvon B, NP   650 mg at 12/14/20  0203   alum & mag hydroxide-simeth (MAALOX/MYLANTA) 200-200-20 MG/5ML suspension 30 mL  30 mL Oral Q4H PRN Rankin, Shuvon B, NP       benztropine (COGENTIN) tablet 0.5 mg  0.5 mg Oral Q6H PRN Arthor Captain, MD       diphenhydrAMINE (BENADRYL) injection 50 mg  50 mg Intramuscular Q6H PRN Arthor Captain, MD       doxepin (SINEQUAN) capsule 10 mg  10 mg Oral QHS PRN Arthor Captain, MD       gabapentin (NEURONTIN) capsule 300 mg  300 mg Oral QHS Arthor Captain, MD   300 mg at 12/19/20 2053   hydrOXYzine (ATARAX/VISTARIL) tablet 25 mg  25 mg Oral Q6H PRN Arthor Captain, MD   25 mg at 12/15/20 2046   LORazepam (ATIVAN) injection 2 mg  2 mg Intramuscular BID PRN Arthor Captain, MD       LORazepam (ATIVAN) tablet 1 mg  1 mg Oral Q6H PRN Arthor Captain, MD   1 mg at 12/16/20 2103   magnesium hydroxide (MILK OF MAGNESIA) suspension 30 mL  30 mL Oral Daily PRN Rankin, Shuvon B, NP       metFORMIN (GLUCOPHAGE) tablet 500 mg  500 mg Oral BID WC Rankin, Shuvon B, NP   500 mg at 12/20/20 0746   Oxcarbazepine (TRILEPTAL) tablet 300 mg  300 mg Oral BID Rankin, Shuvon B, NP   300 mg at 12/20/20 1632   [START ON 12/24/2020] paliperidone (INVEGA SUSTENNA) injection 156 mg  156 mg Intramuscular Q28 days Arthor Captain, MD       polyethylene glycol (MIRALAX / GLYCOLAX) packet 17 g  17 g Oral Daily Arthor Captain, MD   17 g at 12/20/20 0746   propranolol (INDERAL) tablet 40 mg  40 mg Oral Q8H Arthor Captain, MD   40 mg at 12/20/20 1418   risperiDONE (RISPERDAL M-TABS) disintegrating tablet 3 mg  3 mg Oral QHS Arthor Captain, MD   3 mg at 12/19/20 2053   risperiDONE (RISPERDAL M-TABS) disintegrating tablet 3 mg  3 mg Oral Daily Arthor Captain, MD   3 mg at 12/20/20 9528    Lab Results:  No results found for this or any previous visit (from the past 59 hour(s)).    Blood Alcohol level:  Lab Results  Component Value Date   ETH <10 11/29/2020   ETH <10 41/32/4401    Metabolic Disorder Labs: Lab  Results  Component Value Date   HGBA1C 5.5 12/04/2020   MPG 111.15 12/04/2020   No results found for: PROLACTIN Lab Results  Component Value Date   CHOL 123 12/02/2020   TRIG 74 12/02/2020   HDL 35 (L) 12/02/2020   CHOLHDL 3.5 12/02/2020   VLDL 15 12/02/2020   LDLCALC 73 12/02/2020   LDLCALC 101 (H) 06/03/2019    Physical Findings: AIMS: Facial and  Oral Movements Muscles of Facial Expression: None, normal Lips and Perioral Area: None, normal Jaw: None, normal Tongue: None, normal,Extremity Movements Upper (arms, wrists, hands, fingers): None, normal Lower (legs, knees, ankles, toes): None, normal, Trunk Movements Neck, shoulders, hips: None, normal, Overall Severity Severity of abnormal movements (highest score from questions above): None, normal Incapacitation due to abnormal movements: None, normal Patient's awareness of abnormal movements (rate only patient's report): No Awareness, Dental Status Current problems with teeth and/or dentures?: No Does patient usually wear dentures?: No  CIWA:    COWS:     Musculoskeletal: Strength & Muscle Tone: within normal limits Gait & Station: normal Patient leans: N/A  Psychiatric Specialty Exam:  Presentation  General Appearance: Appropriate for Environment; Fairly Groomed  Eye Contact:Good  Speech:Clear and Coherent; Normal Rate  Speech Volume:Normal  Handedness:Right   Mood and Affect  Mood:Anxious  Affect:Constricted   Thought Process  Thought Processes:Coherent  Descriptions of Associations:Tangential  Orientation:Full (Time, Place and Person)  Thought Content:Delusions; Paranoid Ideation  History of Schizophrenia/Schizoaffective disorder:Yes  Duration of Psychotic Symptoms:Greater than six months  Hallucinations:Hallucinations: Auditory  Ideas of Reference:Paranoia; Delusions  Suicidal Thoughts:Suicidal Thoughts: No  Homicidal Thoughts:Homicidal Thoughts: No   Sensorium  Memory:Immediate  Fair; Recent Fair; Remote Fair  Judgment:Impaired  Insight:Shallow   Executive Functions  Concentration:Fair  Attention Span:Fair  [place]  Language:Good   Psychomotor Activity  Psychomotor Activity:Psychomotor Activity: Normal   Assets  Assets:Communication Skills; Social Support; Housing   Sleep  Sleep:Sleep: Good Number of Hours of Sleep: 7.25    Physical Exam: Physical Exam Vitals and nursing note reviewed.  Constitutional:      General: He is not in acute distress.    Appearance: He is not diaphoretic.  HENT:     Head: Normocephalic and atraumatic.  Cardiovascular:     Rate and Rhythm: Normal rate.  Pulmonary:     Effort: Pulmonary effort is normal.  Neurological:     General: No focal deficit present.     Mental Status: He is alert and oriented to person, place, and time.   Review of Systems  Constitutional:  Negative for chills, diaphoresis and fever.  HENT:  Negative for sore throat.   Respiratory:  Negative for cough and shortness of breath.   Cardiovascular:  Negative for chest pain and palpitations.  Gastrointestinal:  Negative for constipation, diarrhea, nausea and vomiting.  Musculoskeletal:  Positive for back pain. Negative for myalgias.       Positive for chronic low back pain  Neurological:  Negative for dizziness, seizures and headaches.  Psychiatric/Behavioral:  Positive for hallucinations. Negative for depression, substance abuse and suicidal ideas. The patient is nervous/anxious. The patient does not have insomnia.   Blood pressure 122/79, pulse (!) 101, temperature 97.7 F (36.5 C), temperature source Oral, resp. rate 16, height 5' 6"  (1.676 m), weight 134.3 kg, SpO2 100 %. Body mass index is 47.78 kg/m.   Treatment Plan Summary: Patient is a 25 year old male with schizoaffective disorder, bipolar type admitted for treatment and stabilization of worsening psychotic and mood symptoms, suicidal ideation and  aggressive behavior.  Patient is displaying some improvement in organization of thought processes and is less exclusively focused on delusional themes.  He is calmer, sleeping better and less intrusive.  Overall he continues to display ongoing symptoms of disorganized thought processes, delusional thought content and hallucinations.  He is tolerating treatment with risperidone and received first loading dose of Invega Sustenna LAI today.  Daily contact with patient  to assess and evaluate symptoms and progress in treatment and Medication management  Schizoaffective disorder -Continue Risperdal M tabs 3 mg PO QAM and 3 mg PO QHS for psychotic symptoms -Start Invega Sustenna LAI.  First loading dose of 234 mg IM administered today 12/20/2020.  Second loading dose of 156 mg IM is due on 12/24/2020.  Plan is for q. 28-day dosing thereafter. -Continue Trileptal 300 mg twice daily for mood stabilization  Anxiety/EPS -Continue propranolol 40 mg Q8H for restlessness and anxiety -Continue benztropine 0.5 mg PO Q6H PRN for tremors, muscle stiffness -Continue diphenhydramine 50 mg IM every 6 hours as needed for acute dystonic reaction  Diabetes Mellitus -Continue metformin 500 mg twice daily with meals  Agitation  -Continue lorazepam 1 mg PO Q6H PRN anxiety, restlessness, agitation -Continue lorazepam 2 mg IM BID PRN for severe agitation only if patient unable to take oral PRN medications for agitation  Anxiety -Continue hydroxyzine 25 mg Q6H PRN  Insomnia -Continue gabapentin 300 mg at bedtime for RLS, sleep -Continue doxepin 10 mg at bedtime as needed for sleep.  Constipation -Continue MiraLAX 17 g daily -Continue MOM 30 mL daily PRN -Encourage intake of p.o. fluids  Discharge planning in progress.  Social work has encouraged family to petition for guardian to be appointed for patient.  Housing plan needs to be clarified.  Arthor Captain, MD 12/20/2020, 6:33 PM

## 2020-12-20 NOTE — Progress Notes (Signed)
Recreation Therapy Notes  Date: 8.31.22 Time: 1000 Location: [address] Dayroom  Group Topic: Communication  Goal Area(s) Addresses:  Patient will effectively communicate with peers in group.  Patient will verbalize benefit of healthy communication. Patient will verbalize positive effect of healthy communication on post d/c goals.  Patient will identify communication techniques that made activity effective for group.   Intervention: Blank paper, Pencils  Activity: Three patients took turns describing Ramsey picture to the rest of the group.  The group members were to draw the pictures how it's being described by the presenter.  This activity is to test how well patients listen and how well they communicate when expressing themselves.  Education: Communication, Discharge Planning  Education Outcome: Acknowledges understanding/In group clarification offered/Needs additional education.   Clinical Observations/Feedback: Due to there being Ramsey COVID case on the unit, group did not occur as usual.  LRT passed out packets that focused on leisure skills. The packet included Ramsey secret code, crossword and word search puzzles all dealing with leisure.      Noah Ramsey, LRT/CTRS         Noah Ramsey 12/20/2020 11:50 AM 

## 2020-12-21 DIAGNOSIS — F250 Schizoaffective disorder, bipolar type: Secondary | ICD-10-CM | POA: Diagnosis not present

## 2020-12-21 NOTE — [facility] Group Notes (Signed)
Adult Psychoeducational Group Note  Date:  12/21/2020 Time:  9:23 PM  Group Topic/Focus:  Conflict Resolution:   The focus of this group is to discuss the conflict resolution process and how it may be used upon discharge.  Participation Level:  Active  Participation Quality:  Appropriate and Attentive  Affect:  Appropriate  Cognitive:  Alert and Appropriate  Insight: Good  Engagement in Group:  Engaged  Modes of Intervention:  Discussion  Additional Comments  Jacalyn Lefevre 12/21/2020, 9:23 PM

## 2020-12-21 NOTE — Group Note (Signed)
Occupational Therapy Group Note  Group Topic:Coping Skills  Group Date: 12/21/2020 Start Time: 1400 End Time: 1445 Facilitators: Adrik Khim, OT   Group Description: Group encouraged increased engagement and participation through discussion and activity focused on "Coping Ahead." Patients were split up into teams and selected a card from a stack of positive coping strategies. Patients were instructed to act out/charade the coping skill for other peers to guess and receive points for their team. Discussion followed with a focus on identifying additional positive coping strategies and patients shared how they were going to cope ahead over the weekend while continuing hospitalization stay.  Therapeutic Goal(s): Identify positive vs negative coping strategies. Identify coping skills to be used during hospitalization vs coping skills outside of hospital/at home Increase participation in therapeutic group environment and promote engagement in treatment   Participation Level: OT Group cancelled d/t COVID+ case on the unit and current COVID precautions in place. Will continue to monitor status and engage patients in OT group at next scheduled time.    Plan: Continue to engage patient in OT groups 2 - 3x/week.  12/21/2020  Roshana Shuffield, OT   

## 2020-12-21 NOTE — Progress Notes (Signed)
Pt continues to be delusional and responding to internal stimuli at times. Pt continues to interact with peers / staff appropriately    12/21/20 2100  Psych Admission Type (Psych Patients Only)  Admission Status Involuntary  Psychosocial Assessment  Patient Complaints Suspiciousness  Eye Contact Brief  Facial Expression Fixed smile  Affect Appropriate to circumstance  Speech Soft  Interaction Assertive  Motor Activity Slow  Appearance/Hygiene Unremarkable  Behavior Characteristics Cooperative  Mood Preoccupied  Thought Process  Coherency WDL  Content Preoccupation  Delusions None reported or observed  Perception Hallucinations  Hallucination None reported or observed  Judgment Poor  Confusion None  Danger to Self  Current suicidal ideation? Denies  Danger to Others  Danger to Others None reported or observed

## 2020-12-21 NOTE — Progress Notes (Signed)
[facility] MD Progress Note  12/21/2020 3:40 PM Noah Ramsey  MRN:  [id number]  Subjective: Noah [facility] reports, "I'm doing alright. I have been in the [facility] now for about 2 weeks or more because I was acting delirious because I overdosed on antipsychotics. I will be honest with you, I did it on purpose because I don't like myself. I'm too hard on myself sometimes because I feel like I'm too relaxed, lay back, lack of self improvement & kind of neutral. I'm not feeling depressed, just a little anxious at times. I'm not hearing any voices now, but I do see thing because I have astigmatism. That is all. I wish I can lose weigh. I would like to run or walk at times, it is just that my knees has given out".  Patient is observed talking to himself prior to this follow-up care evaluation.  Reason for admission: Patient is a 25 year old male with history of schizoaffective disorder admitted for insomnia x 5 days, aggressive behavior, delusions, suicidal ideation and auditory hallucinations.  (Per previous notes): Objective: Medical record reviewed.  Patient's case discussed in detail with members of the treatment team.  I met with and evaluated the patient on the unit today for follow-up today.  Patient is similar to yesterday.  He continues to speak of grandiose and paranoid delusional themes and is focused on physical concerns about weight loss and building muscle.  Patient is still tangential but it is easier to redirect him today to answer questions.  He is less intrusive.  During our conversation patient smiles and laughs to himself on 1 occasion in response to internal stimuli.  Patient denies SI, AI, HI, VH.  He states that he is eating and sleeping well.  Patient denies any medication side effects.  I discussed changing from oral risperidone to [place] today.  Patient is receptive to receiving Kirt Boys injection but prefers to receive it in his gluteal muscles so that it will not interfere  with doing push-ups.  He denies tongue thickness, trouble swallowing, trouble speaking, trouble breathing, muscle spasms, muscle stiffness, restlessness, tremor or other medication side effects.  Patient denies sore throat, cough, headache, myalgias, nausea, vomiting or other physical problems.  He reports that he had a bowel movement yesterday.  Staff document that patient slept 7.25 hours last night.  He is taking scheduled medications as prescribed except he refused his evening dose of metformin.  He received the first loading dose of Invega Sustenna LAI this afternoon.  Patient has not required any PRN medications in the last 24 hours.  Labs this morning include BP of 106/78 sitting and 148/107 standing; pulse 82 sitting and 93 standing; O2 sat of 100% on room air and temperature of 97.7.  Staff report that patient has continued to intermittently delusional statements and respond to internal stimuli on the unit.    Principal Problem: Schizoaffective disorder ([place]) Diagnosis: Principal Problem:   Schizoaffective disorder ([place])  Total Time spent with patient:  25 minutes  Past Psychiatric History: See admission H&P  Past Medical History:  Past Medical History:  Diagnosis Date   Elevated CPK    per patient   Schizophrenia ([place])    History reviewed. No pertinent surgical history. Family History:  Family History  Problem Relation Age of Onset   Mental illness Brother    Family Psychiatric  History: See admission H&P Social History:  Social History   Substance and Sexual Activity  Alcohol Use Never     Social  History   Substance and Sexual Activity  Drug Use Never    Social History   Socioeconomic History   Marital status: Single    Spouse name: Not on file   Number of children: Not on file   Years of education: Not on file   Highest education level: Not on file  Occupational History   Not on file  Tobacco Use   Smoking status: Never   Smokeless tobacco: Never  Substance  and Sexual Activity   Alcohol use: Never   Drug use: Never   Sexual activity: Not on file  Other Topics Concern   Not on file  Social History Narrative   Not on file   Social Determinants of Health   Financial Resource Strain: Not on file  Food Insecurity: Not on file  Transportation Needs: Not on file  Physical Activity: Not on file  Stress: Not on file  Social Connections: Not on file   Additional Social History:   Sleep: Good  Appetite:  Good  Current Medications: Current Facility-Administered Medications  Medication Dose Route Frequency Provider Last Rate Last Admin   acetaminophen (TYLENOL) tablet 650 mg  650 mg Oral Q6H PRN Rankin, Shuvon B, NP   650 mg at 12/14/20 0203   alum & mag hydroxide-simeth (MAALOX/MYLANTA) 200-200-20 MG/5ML suspension 30 mL  30 mL Oral Q4H PRN Rankin, Shuvon B, NP       benztropine (COGENTIN) tablet 0.5 mg  0.5 mg Oral Q6H PRN Arthor Captain, MD       diphenhydrAMINE (BENADRYL) injection 50 mg  50 mg Intramuscular Q6H PRN Arthor Captain, MD       doxepin (SINEQUAN) capsule 10 mg  10 mg Oral QHS PRN Arthor Captain, MD       gabapentin (NEURONTIN) capsule 300 mg  300 mg Oral QHS Arthor Captain, MD   300 mg at 12/20/20 2036   hydrOXYzine (ATARAX/VISTARIL) tablet 25 mg  25 mg Oral Q6H PRN Arthor Captain, MD   25 mg at 12/20/20 2035   LORazepam (ATIVAN) injection 2 mg  2 mg Intramuscular BID PRN Arthor Captain, MD       LORazepam (ATIVAN) tablet 1 mg  1 mg Oral Q6H PRN Arthor Captain, MD   1 mg at 12/16/20 2103   magnesium hydroxide (MILK OF MAGNESIA) suspension 30 mL  30 mL Oral Daily PRN Rankin, Shuvon B, NP       metFORMIN (GLUCOPHAGE) tablet 500 mg  500 mg Oral BID WC Rankin, Shuvon B, NP   500 mg at 12/21/20 0939   Oxcarbazepine (TRILEPTAL) tablet 300 mg  300 mg Oral BID Rankin, Shuvon B, NP   300 mg at 12/21/20 0942   [START ON 12/24/2020] paliperidone (INVEGA SUSTENNA) injection 156 mg  156 mg Intramuscular Q28 days Arthor Captain, MD        polyethylene glycol (MIRALAX / GLYCOLAX) packet 17 g  17 g Oral Daily Arthor Captain, MD   17 g at 12/21/20 0939   propranolol (INDERAL) tablet 40 mg  40 mg Oral Q8H Arthor Captain, MD   40 mg at 12/21/20 1447   risperiDONE (RISPERDAL M-TABS) disintegrating tablet 3 mg  3 mg Oral QHS Arthor Captain, MD   3 mg at 12/20/20 2035   risperiDONE (RISPERDAL M-TABS) disintegrating tablet 3 mg  3 mg Oral Daily Arthor Captain, MD   3 mg at 12/21/20 2505   Lab Results:  No results found  for this or any previous visit (from the past 48 hour(s)).   Blood Alcohol level:  Lab Results  Component Value Date   ETH <10 11/29/2020   ETH <10 15/40/0867   Metabolic Disorder Labs: Lab Results  Component Value Date   HGBA1C 5.5 12/04/2020   MPG 111.15 12/04/2020   No results found for: PROLACTIN Lab Results  Component Value Date   CHOL 123 12/02/2020   TRIG 74 12/02/2020   HDL 35 (L) 12/02/2020   CHOLHDL 3.5 12/02/2020   VLDL 15 12/02/2020   LDLCALC 73 12/02/2020   LDLCALC 101 (H) 06/03/2019   Physical Findings: AIMS: Facial and Oral Movements Muscles of Facial Expression: None, normal Lips and Perioral Area: None, normal Jaw: None, normal Tongue: None, normal,Extremity Movements Upper (arms, wrists, hands, fingers): None, normal Lower (legs, knees, ankles, toes): None, normal, Trunk Movements Neck, shoulders, hips: None, normal, Overall Severity Severity of abnormal movements (highest score from questions above): None, normal Incapacitation due to abnormal movements: None, normal Patient's awareness of abnormal movements (rate only patient's report): No Awareness, Dental Status Current problems with teeth and/or dentures?: No Does patient usually wear dentures?: No  CIWA:    COWS:     Musculoskeletal: Strength & Muscle Tone: within normal limits Gait & Station: normal Patient leans: N/A  Psychiatric Specialty Exam:  Presentation  General Appearance: Appropriate for  Environment; Fairly Groomed  Eye Contact:Good  Speech:Clear and Coherent; Normal Rate  Speech Volume:Normal  Handedness:Right  Mood and Affect  Mood:Anxious  Affect:Constricted  Thought Process  Thought Processes:Coherent  Descriptions of Associations:Tangential  Orientation:Full (Time, Place and Person)  Thought Content:Delusions; Paranoid Ideation  History of Schizophrenia/Schizoaffective disorder:Yes  Duration of Psychotic Symptoms:Greater than six months  Hallucinations:Hallucinations: Auditory  Ideas of Reference:Paranoia; Delusions  Suicidal Thoughts:Suicidal Thoughts: No  Homicidal Thoughts:Homicidal Thoughts: No  Sensorium  Memory:Immediate Fair; Recent Fair; Remote Fair  Judgment:Impaired  Insight:Shallow  Executive Functions  Concentration:Fair  Attention Span:Fair  Eclectic  Language:Good  Psychomotor Activity  Psychomotor Activity:Psychomotor Activity: Normal  Assets  Assets:Communication Skills; Social Support; Housing  Sleep  Sleep:Sleep: Good Number of Hours of Sleep: 7.25  Physical Exam: Physical Exam Vitals and nursing note reviewed.  Constitutional:      General: He is not in acute distress.    Appearance: He is not diaphoretic.  HENT:     Head: Normocephalic and atraumatic.     Mouth/Throat:     Pharynx: Oropharynx is clear.  Eyes:     Pupils: Pupils are equal, round, and reactive to light.  Cardiovascular:     Rate and Rhythm: Normal rate.  Pulmonary:     Effort: Pulmonary effort is normal.  Genitourinary:    Comments: Deferred Musculoskeletal:        General: Normal range of motion.  Skin:    General: Skin is warm.  Neurological:     General: No focal deficit present.     Mental Status: He is alert and oriented to person, place, and time.   Review of Systems  Constitutional:  Negative for chills, diaphoresis and fever.  HENT:  Negative for sore throat.   Respiratory:  Negative  for cough and shortness of breath.   Cardiovascular:  Negative for chest pain and palpitations.  Gastrointestinal:  Negative for constipation, diarrhea, nausea and vomiting.  Musculoskeletal:  Positive for back pain. Negative for myalgias.       Positive for chronic low back pain  Neurological:  Negative for dizziness, seizures and  headaches.  Psychiatric/Behavioral:  Positive for hallucinations. Negative for depression, memory loss, substance abuse and suicidal ideas. The patient is nervous/anxious. The patient does not have insomnia.   Blood pressure 136/80, pulse 90, temperature 97.7 F (36.5 C), temperature source Oral, resp. rate 16, height 5' 6"  (1.676 m), weight 134.3 kg, SpO2 100 %. Body mass index is 47.78 kg/m.   Treatment Plan Summary: Patient is a 25 year old male with schizoaffective disorder, bipolar type admitted for treatment and stabilization of worsening psychotic and mood symptoms, suicidal ideation and aggressive behavior.  Patient is displaying some improvement in organization of thought processes and is less exclusively focused on delusional themes.  He is calmer, sleeping better and less intrusive.  Overall he continues to display ongoing symptoms of disorganized thought processes, delusional thought content and hallucinations.  He is tolerating treatment with risperidone and received first loading dose of Invega Sustenna LAI today.  Daily contact with patient to assess and evaluate symptoms and progress in treatment and Medication management  Schizoaffective disorder -Continue Risperdal M tabs 3 mg PO QAM and 3 mg PO QHS for psychotic symptoms -Invega Sustenna LAI.  First loading dose of 234 mg IM administered on 12/20/2020. -Continue Second Invega loading dose of 156 mg IM due on 12/24/2020.  Plan is for q. 28-day dosing thereafter. -Continue Trileptal 300 mg twice daily for mood stabilization  Anxiety/EPS -Continue propranolol 40 mg Q8H for restlessness and  anxiety -Continue benztropine 0.5 mg PO Q6H PRN for tremors, muscle stiffness -Continue diphenhydramine 50 mg IM every 6 hours as needed for acute dystonic reaction  Diabetes Mellitus -Continue metformin 500 mg twice daily with meals  Agitation  -Continue lorazepam 1 mg PO Q6H PRN anxiety, restlessness, agitation -Continue lorazepam 2 mg IM BID PRN for severe agitation only if patient unable to take oral PRN medications for agitation  Anxiety -Continue hydroxyzine 25 mg Q6H PRN  Insomnia -Continue gabapentin 300 mg at bedtime for RLS, sleep -Continue doxepin 10 mg at bedtime as needed for sleep.  Constipation -Continue MiraLAX 17 g daily -Continue MOM 30 mL daily PRN -Encourage intake of p.o. fluids  Discharge planning in progress.  Social work has encouraged family to petition for guardian to be appointed for patient.  Housing plan needs to be clarified.  [facility] Spar, NP, pmhnp, fnp-bc 12/21/2020, 3:40 PM Patient ID: Noah [facility], male   DOB: 01/26/1996, 25 y.o.   MRN: [id number]

## 2020-12-21 NOTE — Progress Notes (Signed)
   12/21/20 1100  Psych Admission Type (Psych Patients Only)  Admission Status Involuntary  Psychosocial Assessment  Patient Complaints None  Eye Contact Brief  Facial Expression Fixed smile  Affect Appropriate to circumstance  Speech Soft  Interaction Assertive  Motor Activity Slow  Appearance/Hygiene Unremarkable  Behavior Characteristics Cooperative  Mood Preoccupied  Thought Process  Coherency WDL  Content Preoccupation  Delusions None reported or observed  Perception Hallucinations  Hallucination None reported or observed  Judgment Poor  Confusion None  Danger to Self  Current suicidal ideation? Denies  Danger to Others  Danger to Others None reported or observed  Dar note: Patient is calm and pleasant.  Medications given as prescribed.  Safety checks maintained.  Denies suicidal thoughts, auditory and visual hallucinations.  Patient is safe on the unit.

## 2020-12-21 NOTE — Group Note (Signed)
Recreation Therapy Group Note   Group Topic:Healthy Decision Making  Group Date: 12/21/2020 Start Time: 1000 End Time:  Facilitators: Bjorn Loser, NT Location: [address] Dayroom   Group Description: Patients were given a scenario that they were going to be stranded on a deserted [place] for several months before being rescued. Writer tasked them with making a list of 15 things they would choose to bring with them for "survival". The list of items was prioritized most important to least. Each patient would come up with their own list, then work together to create a new list of 15 items while in a group of 3-5 peers. LRT discussed each person's list and how it differed from others. The debrief included discussion of priorities, good decisions versus bad decisions, and how it is important to think before acting so we can make the best decision possible. LRT tied the concept of effective communication among group members to patient's support systems outside of the hospital and its benefit post discharge.  Goal Area(s) Addresses:  Patient will effectively work with peer towards shared goal.  Patient will identify factors that guided their decision making.  Patient will pro-socially communicate ideas during group session.  Education: Social Skills, Journalist, newspaper, Communication, Priorities, Support System, Discharge Planning    Affect/Mood: N/A   Participation Level: Pt did not attend group.    Clinical Observations/Individualized Feedback: Group did not occur as scheduled due to COVID being on the hall.  LRT gave out packets dealing with coping skills.  Packet included worksheets dealing with identifying coping skills for triggers, coping skills for various emotions, word search and crossword puzzle.   Continue to engage patient in RT group sessions 2-3x/week.   Caroll Rancher, LRT/CTRS 12/21/2020 11:26 AM

## 2020-12-22 ENCOUNTER — Encounter (HOSPITAL_COMMUNITY): Payer: Self-pay

## 2020-12-22 DIAGNOSIS — F250 Schizoaffective disorder, bipolar type: Secondary | ICD-10-CM | POA: Diagnosis not present

## 2020-12-22 LAB — POC SARS CORONAVIRUS 2 AG: SARSCOV2ONAVIRUS 2 AG: NEGATIVE

## 2020-12-22 MED ORDER — PROPRANOLOL HCL 10 MG PO TABS
ORAL_TABLET | ORAL | Status: AC
  Administered 2020-12-23: 40 mg via ORAL
  Filled 2020-12-22: qty 1

## 2020-12-22 MED ORDER — RISPERIDONE 3 MG PO TABS
ORAL_TABLET | ORAL | Status: AC
  Filled 2020-12-22: qty 1

## 2020-12-22 NOTE — BH IP Treatment Plan (Signed)
Interdisciplinary Treatment and Diagnostic Plan Update  12/22/2020 Time of Session: Did not attend  Noah [facility] MRN: [id number]  Principal Diagnosis: Schizoaffective disorder [facility])  Secondary Diagnoses: Principal Problem:   Schizoaffective disorder (HCC)   Current Medications:  Current Facility-Administered Medications  Medication Dose Route Frequency Provider Last Rate Last Admin   acetaminophen (TYLENOL) tablet 650 mg  650 mg Oral Q6H PRN Rankin, Shuvon B, NP   650 mg at 12/14/20 0203   alum & mag hydroxide-simeth (MAALOX/MYLANTA) 200-200-20 MG/5ML suspension 30 mL  30 mL Oral Q4H PRN Rankin, Shuvon B, NP       benztropine (COGENTIN) tablet 0.5 mg  0.5 mg Oral Q6H PRN Claudie Revering, MD       diphenhydrAMINE (BENADRYL) injection 50 mg  50 mg Intramuscular Q6H PRN Claudie Revering, MD       doxepin (SINEQUAN) capsule 10 mg  10 mg Oral QHS PRN Claudie Revering, MD       gabapentin (NEURONTIN) capsule 300 mg  300 mg Oral QHS Claudie Revering, MD   300 mg at 12/21/20 2043   hydrOXYzine (ATARAX/VISTARIL) tablet 25 mg  25 mg Oral Q6H PRN Claudie Revering, MD   25 mg at 12/21/20 2044   LORazepam (ATIVAN) injection 2 mg  2 mg Intramuscular BID PRN Claudie Revering, MD       LORazepam (ATIVAN) tablet 1 mg  1 mg Oral Q6H PRN Claudie Revering, MD   1 mg at 12/16/20 2103   magnesium hydroxide (MILK OF MAGNESIA) suspension 30 mL  30 mL Oral Daily PRN Rankin, Shuvon B, NP       metFORMIN (GLUCOPHAGE) tablet 500 mg  500 mg Oral BID WC Rankin, Shuvon B, NP   500 mg at 12/22/20 0810   Oxcarbazepine (TRILEPTAL) tablet 300 mg  300 mg Oral BID Rankin, Shuvon B, NP   300 mg at 12/22/20 0810   [START ON 12/24/2020] paliperidone (INVEGA SUSTENNA) injection 156 mg  156 mg Intramuscular Q28 days Claudie Revering, MD       polyethylene glycol (MIRALAX / GLYCOLAX) packet 17 g  17 g Oral Daily Claudie Revering, MD   17 g at 12/22/20 0809   propranolol (INDERAL) tablet 40 mg  40 mg Oral Q8H Claudie Revering, MD   40  mg at 12/22/20 0810   risperiDONE (RISPERDAL M-TABS) disintegrating tablet 3 mg  3 mg Oral QHS Claudie Revering, MD   3 mg at 12/21/20 2043   risperiDONE (RISPERDAL M-TABS) disintegrating tablet 3 mg  3 mg Oral Daily Claudie Revering, MD   3 mg at 12/22/20 1017   PTA Medications: Medications Prior to Admission  Medication Sig Dispense Refill Last Dose   diazepam (VALIUM) 5 MG tablet Take 5 mg by mouth 2 (two) times daily as needed for anxiety.      haloperidol (HALDOL) 10 MG tablet Take 10 mg by mouth at bedtime. (Patient not taking: No sig reported)      haloperidol decanoate (HALDOL DECANOATE) 100 MG/ML injection Inject 100 mg into the muscle every 28 (twenty-eight) days. (Patient not taking: No sig reported)      LATUDA 40 MG TABS tablet Take 40 mg by mouth at bedtime.      metFORMIN (GLUCOPHAGE) 500 MG tablet Take 1 tablet (500 mg total) by mouth 2 (two) times daily with a meal. (Patient not taking: No sig reported) 180 tablet 3    OVER THE COUNTER MEDICATION Ripped Fat  Burner (diet pill) (Patient not taking: Reported on 12/06/2020)   Not Taking   OVER THE COUNTER MEDICATION Yohimbine (diet pill) (Patient not taking: Reported on 12/06/2020)   Not Taking   propranolol (INDERAL) 40 MG tablet Take 40 mg by mouth 3 (three) times daily.       Patient Stressors: Financial difficulties Medication change or noncompliance  Patient Strengths: Manufacturing systems engineer Supportive family/friends  Treatment Modalities: Medication Management, Group therapy, Case management,  1 to 1 session with clinician, Psychoeducation, Recreational therapy.   Physician Treatment Plan for Primary Diagnosis: Schizoaffective disorder (HCC) Long Term Goal(s): Improvement in symptoms so as ready for discharge   Short Term Goals: Ability to identify changes in lifestyle to reduce recurrence of condition will improve Ability to verbalize feelings will improve Ability to disclose and discuss suicidal ideas Ability to  demonstrate self-control will improve Ability to identify and develop effective coping behaviors will improve Ability to maintain clinical measurements within normal limits will improve Compliance with prescribed medications will improve  Medication Management: Evaluate patient's response, side effects, and tolerance of medication regimen.  Therapeutic Interventions: 1 to 1 sessions, Unit Group sessions and Medication administration.  Evaluation of Outcomes: Progressing  Physician Treatment Plan for Secondary Diagnosis: Principal Problem:   Schizoaffective disorder (HCC)  Long Term Goal(s): Improvement in symptoms so as ready for discharge   Short Term Goals: Ability to identify changes in lifestyle to reduce recurrence of condition will improve Ability to verbalize feelings will improve Ability to disclose and discuss suicidal ideas Ability to demonstrate self-control will improve Ability to identify and develop effective coping behaviors will improve Ability to maintain clinical measurements within normal limits will improve Compliance with prescribed medications will improve     Medication Management: Evaluate patient's response, side effects, and tolerance of medication regimen.  Therapeutic Interventions: 1 to 1 sessions, Unit Group sessions and Medication administration.  Evaluation of Outcomes: Progressing   RN Treatment Plan for Primary Diagnosis: Schizoaffective disorder (HCC) Long Term Goal(s): Knowledge of disease and therapeutic regimen to maintain health will improve  Short Term Goals: Ability to verbalize frustration and anger appropriately will improve, Ability to demonstrate self-control, Ability to participate in decision making will improve, Ability to verbalize feelings will improve, Ability to identify and develop effective coping behaviors will improve, and Compliance with prescribed medications will improve  Medication Management: RN will administer  medications as ordered by provider, will assess and evaluate patient's response and provide education to patient for prescribed medication. RN will report any adverse and/or side effects to prescribing provider.  Therapeutic Interventions: 1 on 1 counseling sessions, Psychoeducation, Medication administration, Evaluate responses to treatment, Monitor vital signs and CBGs as ordered, Perform/monitor CIWA, COWS, AIMS and Fall Risk screenings as ordered, Perform wound care treatments as ordered.  Evaluation of Outcomes: Progressing   LCSW Treatment Plan for Primary Diagnosis: Schizoaffective disorder (HCC) Long Term Goal(s): Safe transition to appropriate next level of care at discharge, Engage patient in therapeutic group addressing interpersonal concerns.  Short Term Goals: Engage patient in aftercare planning with referrals and resources, Increase social support, Increase ability to appropriately verbalize feelings, Facilitate acceptance of mental health diagnosis and concerns, and Increase skills for wellness and recovery  Therapeutic Interventions: Assess for all discharge needs, 1 to 1 time with Social worker, Explore available resources and support systems, Assess for adequacy in community support network, Educate family and significant other(s) on suicide prevention, Complete Psychosocial Assessment, Interpersonal group therapy.  Evaluation of Outcomes: Progressing   Progress  in Treatment: Attending groups: Yes. Participating in groups: Yes. Taking medication as prescribed: Yes. Toleration medication: Yes. Family/Significant other contact made: Yes, individual(s) contacted:  Mother Patient understands diagnosis: No. Discussing patient identified problems/goals with staff: Yes. Medical problems stabilized or resolved: Yes. Denies suicidal/homicidal ideation: Yes. Issues/concerns per patient self-inventory: No.  New problem(s) identified: No, Describe:  none  New Short Term/Long  Term Goal(s):  medication stabilization, elimination of SI thoughts, development of comprehensive mental wellness plan.    Patient Goals: Did not attend  Discharge Plan or Barriers: Patient will be discharged home to stay with mother.  Patient will be discharged with med management and therapy.  Patient has a referral for ACTT services, CST services and a day program for additional support.   Reason for Continuation of Hospitalization: Delusions  Hallucinations Medication stabilization  Estimated Length of Stay: 3-5 days   Scribe for Treatment Team: Beatris Si, LCSW 12/22/2020 11:25 AM

## 2020-12-22 NOTE — Progress Notes (Signed)
Pt did not attend 1:1 orientation/goals group. 

## 2020-12-22 NOTE — Progress Notes (Signed)
[facility] MD Progress Note  12/22/2020 3:19 PM Noah Ramsey  MRN:  [id number]  Subjective: Noah Ramsey reports, "It is going pretty good today. I slept for about 8-10 hours last night. I feel neutral this morning meaning, I have a poker face. I have an impact with telepath & I can communicate telepathically with those who specialize in chemistry. Once I get out of here, I will try to make friends with girls. It will be basically friends with benefit because I will be having intercourse with them. I'm heterosexual".  Daily notes: See previous objective notes below. There have been some improvement in patient's issues with psychosis. However, he remains delusional & tangential in most of his statements/beliefs. He continues to talk to himself from time to time & can stare intensely at the staff at times & can make some inappropriate statements that are sexual in nature. However, he remains cooperative with staff, taking his medications. Denies any side effects. There are no violent behaviors displayed. Will receive the next dose of his Invega injectable this coming Sunday 12-24-20.   Reason for admission: Patient is a 25 year old male with history of schizoaffective disorder admitted for insomnia x 5 days, aggressive behavior, delusions, suicidal ideation and auditory hallucinations.  (Per previous notes): Objective: Medical record reviewed.  Patient's case discussed in detail with members of the treatment team.  I met with and evaluated the patient on the unit today for follow-up today.  Patient is similar to yesterday.  He continues to speak of grandiose and paranoid delusional themes and is focused on physical concerns about weight loss and building muscle.  Patient is still tangential but it is easier to redirect him today to answer questions.  He is less intrusive.  During our conversation patient smiles and laughs to himself on 1 occasion in response to internal stimuli.  Patient denies SI, AI, HI, VH.  He  states that he is eating and sleeping well.  Patient denies any medication side effects.  I discussed changing from oral risperidone to [place] today.  Patient is receptive to receiving Kirt Boys injection but prefers to receive it in his gluteal muscles so that it will not interfere with doing push-ups.  He denies tongue thickness, trouble swallowing, trouble speaking, trouble breathing, muscle spasms, muscle stiffness, restlessness, tremor or other medication side effects.  Patient denies sore throat, cough, headache, myalgias, nausea, vomiting or other physical problems.  He reports that he had a bowel movement yesterday.  Staff document that patient slept 7.25 hours last night.  He is taking scheduled medications as prescribed except he refused his evening dose of metformin.  He received the first loading dose of Invega Sustenna LAI this afternoon.  Patient has not required any PRN medications in the last 24 hours.  Labs this morning include BP of 106/78 sitting and 148/107 standing; pulse 82 sitting and 93 standing; O2 sat of 100% on room air and temperature of 97.7.  Staff report that patient has continued to intermittently delusional statements and respond to internal stimuli on the unit.    Principal Problem: Schizoaffective disorder ([place]) Diagnosis: Principal Problem:   Schizoaffective disorder ([place])  Total Time spent with patient:  25 minutes  Past Psychiatric History: See admission H&P  Past Medical History:  Past Medical History:  Diagnosis Date   Elevated CPK    per patient   Schizophrenia ([place])    History reviewed. No pertinent surgical history. Family History:  Family History  Problem Relation Age of Onset  Mental illness Brother    Family Psychiatric  History: See admission H&P Social History:  Social History   Substance and Sexual Activity  Alcohol Use Never     Social History   Substance and Sexual Activity  Drug Use Never    Social History    Socioeconomic History   Marital status: Single    Spouse name: Not on file   Number of children: Not on file   Years of education: Not on file   Highest education level: Not on file  Occupational History   Not on file  Tobacco Use   Smoking status: Never   Smokeless tobacco: Never  Substance and Sexual Activity   Alcohol use: Never   Drug use: Never   Sexual activity: Not on file  Other Topics Concern   Not on file  Social History Narrative   Not on file   Social Determinants of Health   Financial Resource Strain: Not on file  Food Insecurity: Not on file  Transportation Needs: Not on file  Physical Activity: Not on file  Stress: Not on file  Social Connections: Not on file   Additional Social History:   Sleep: Good  Appetite:  Good  Current Medications: Current Facility-Administered Medications  Medication Dose Route Frequency Provider Last Rate Last Admin   acetaminophen (TYLENOL) tablet 650 mg  650 mg Oral Q6H PRN Rankin, Shuvon B, NP   650 mg at 12/14/20 0203   alum & mag hydroxide-simeth (MAALOX/MYLANTA) 200-200-20 MG/5ML suspension 30 mL  30 mL Oral Q4H PRN Rankin, Shuvon B, NP       benztropine (COGENTIN) tablet 0.5 mg  0.5 mg Oral Q6H PRN Arthor Captain, MD       diphenhydrAMINE (BENADRYL) injection 50 mg  50 mg Intramuscular Q6H PRN Arthor Captain, MD       doxepin (SINEQUAN) capsule 10 mg  10 mg Oral QHS PRN Arthor Captain, MD       gabapentin (NEURONTIN) capsule 300 mg  300 mg Oral QHS Arthor Captain, MD   300 mg at 12/21/20 2043   hydrOXYzine (ATARAX/VISTARIL) tablet 25 mg  25 mg Oral Q6H PRN Arthor Captain, MD   25 mg at 12/21/20 2044   LORazepam (ATIVAN) injection 2 mg  2 mg Intramuscular BID PRN Arthor Captain, MD       LORazepam (ATIVAN) tablet 1 mg  1 mg Oral Q6H PRN Arthor Captain, MD   1 mg at 12/16/20 2103   magnesium hydroxide (MILK OF MAGNESIA) suspension 30 mL  30 mL Oral Daily PRN Rankin, Shuvon B, NP       metFORMIN (GLUCOPHAGE)  tablet 500 mg  500 mg Oral BID WC Rankin, Shuvon B, NP   500 mg at 12/22/20 0810   Oxcarbazepine (TRILEPTAL) tablet 300 mg  300 mg Oral BID Rankin, Shuvon B, NP   300 mg at 12/22/20 0810   [START ON 12/24/2020] paliperidone (INVEGA SUSTENNA) injection 156 mg  156 mg Intramuscular Q28 days Arthor Captain, MD       polyethylene glycol (MIRALAX / GLYCOLAX) packet 17 g  17 g Oral Daily Arthor Captain, MD   17 g at 12/22/20 0809   propranolol (INDERAL) tablet 40 mg  40 mg Oral Q8H Arthor Captain, MD   40 mg at 12/22/20 1438   risperiDONE (RISPERDAL M-TABS) disintegrating tablet 3 mg  3 mg Oral QHS Arthor Captain, MD   3 mg at 12/21/20 2043  risperiDONE (RISPERDAL M-TABS) disintegrating tablet 3 mg  3 mg Oral Daily Arthor Captain, MD   3 mg at 12/22/20 3710   Lab Results:  No results found for this or any previous visit (from the past 48 hour(s)).   Blood Alcohol level:  Lab Results  Component Value Date   ETH <10 11/29/2020   ETH <10 62/69/4854   Metabolic Disorder Labs: Lab Results  Component Value Date   HGBA1C 5.5 12/04/2020   MPG 111.15 12/04/2020   No results found for: PROLACTIN Lab Results  Component Value Date   CHOL 123 12/02/2020   TRIG 74 12/02/2020   HDL 35 (L) 12/02/2020   CHOLHDL 3.5 12/02/2020   VLDL 15 12/02/2020   LDLCALC 73 12/02/2020   LDLCALC 101 (H) 06/03/2019   Physical Findings: AIMS: Facial and Oral Movements Muscles of Facial Expression: None, normal Lips and Perioral Area: None, normal Jaw: None, normal Tongue: None, normal,Extremity Movements Upper (arms, wrists, hands, fingers): None, normal Lower (legs, knees, ankles, toes): None, normal, Trunk Movements Neck, shoulders, hips: None, normal, Overall Severity Severity of abnormal movements (highest score from questions above): None, normal Incapacitation due to abnormal movements: None, normal Patient's awareness of abnormal movements (rate only patient's report): No Awareness, Dental  Status Current problems with teeth and/or dentures?: No Does patient usually wear dentures?: No  CIWA:    COWS:     Musculoskeletal: Strength & Muscle Tone: within normal limits Gait & Station: normal Patient leans: N/A  Psychiatric Specialty Exam:  Presentation  General Appearance: Casual; Neat  Eye Contact:Good (Starring intensly at times)  Speech:Normal Rate; Clear and Coherent  Speech Volume:Normal  Handedness:Right  Mood and Affect  Mood:Euphoric  Affect:Appropriate; Congruent  Thought Process  Thought Processes:Disorganized  Descriptions of Associations:Tangential  Orientation:Full (Time, Place and Person)  Thought Content:Illogical; Delusions; Tangential  History of Schizophrenia/Schizoaffective disorder:Yes  Duration of Psychotic Symptoms:Greater than six months  Hallucinations:Hallucinations: -- (Denies any AH today.) Description of Auditory Hallucinations: NA  Ideas of Reference:Delusions  Suicidal Thoughts:Suicidal Thoughts: No  Homicidal Thoughts:Homicidal Thoughts: No HI Passive Intent and/or Plan: Without Intent; Without Plan; Without Means to Carry Out; Without Access to Means  Sensorium  Memory:Immediate Good; Recent Good; Remote Fair  Judgment:Fair  Insight:Fair  Executive Functions  Concentration:Fair  Attention Span:Fair  [place]  Language:Good  Psychomotor Activity  Psychomotor Activity:Psychomotor Activity: Normal  Assets  Assets:Communication Skills; Desire for Improvement; Housing; Resilience; Social Support  Sleep  Sleep:Sleep: Good Number of Hours of Sleep: 6.5  Physical Exam: Physical Exam Vitals and nursing note reviewed.  Constitutional:      General: He is not in acute distress.    Appearance: He is not diaphoretic.  HENT:     Head: Normocephalic and atraumatic.     Mouth/Throat:     Pharynx: Oropharynx is clear.  Eyes:     Pupils: Pupils are equal, round, and reactive  to light.  Cardiovascular:     Rate and Rhythm: Normal rate.  Pulmonary:     Effort: Pulmonary effort is normal.  Genitourinary:    Comments: Deferred Musculoskeletal:        General: Normal range of motion.  Skin:    General: Skin is warm.  Neurological:     General: No focal deficit present.     Mental Status: He is alert and oriented to person, place, and time.   Review of Systems  Constitutional:  Negative for chills, diaphoresis and fever.  HENT:  Negative for  sore throat.   Respiratory:  Negative for cough and shortness of breath.   Cardiovascular:  Negative for chest pain and palpitations.  Gastrointestinal:  Negative for constipation, diarrhea, nausea and vomiting.  Musculoskeletal:  Positive for back pain. Negative for myalgias.       Positive for chronic low back pain  Neurological:  Negative for dizziness, seizures and headaches.  Psychiatric/Behavioral:  Positive for hallucinations. Negative for depression, memory loss, substance abuse and suicidal ideas. The patient is nervous/anxious. The patient does not have insomnia.   Blood pressure (!) 122/100, pulse (!) 102, temperature 98.6 F (37 C), temperature source Oral, resp. rate 16, height _0  (1.676 m), weight 134.3 kg, SpO2 98 %. Body mass index is 47.78 kg/m.   Treatment Plan Summary: Patient is a 25 year old male with schizoaffective disorder, bipolar type admitted for treatment and stabilization of worsening psychotic and mood symptoms, suicidal ideation and aggressive behavior.  Patient is displaying some improvement in organization of thought processes and is less exclusively focused on delusional themes.  He is calmer, sleeping better and less intrusive.  Overall he continues to display ongoing symptoms of disorganized thought processes, delusional thought content and hallucinations.  He is tolerating treatment with risperidone and received first loading dose of Invega Sustenna LAI today.  Daily contact with  patient to assess and evaluate symptoms and progress in treatment and Medication management  Schizoaffective disorder -Continue Risperdal M tabs 3 mg PO QAM and 3 mg PO QHS for psychotic symptoms -Invega Sustenna LAI.  First loading dose of 234 mg IM administered on 12/20/2020. -Continue Second Invega loading dose of 156 mg IM due on 12/24/2020.  Plan is for q. 28-day dosing thereafter. -Continue Trileptal 300 mg twice daily for mood stabilization  Anxiety/EPS -Continue propranolol 40 mg Q8H for restlessness and anxiety -Continue benztropine 0.5 mg PO Q6H PRN for tremors, muscle stiffness -Continue diphenhydramine 50 mg IM every 6 hours as needed for acute dystonic reaction  Diabetes Mellitus -Continue metformin 500 mg twice daily with meals  Agitation  -Continue lorazepam 1 mg PO Q6H PRN anxiety, restlessness, agitation -Continue lorazepam 2 mg IM BID PRN for severe agitation only if patient unable to take oral PRN medications for agitation  Anxiety -Continue hydroxyzine 25 mg Q6H PRN  Insomnia -Continue gabapentin 300 mg at bedtime for RLS, sleep -Continue doxepin 10 mg at bedtime as needed for sleep.  Constipation -Continue MiraLAX 17 g daily -Continue MOM 30 mL daily PRN -Encourage intake of p.o. fluids  Discharge planning in progress.  Social work has encouraged family to petition for guardian to be appointed for patient.  Housing plan needs to be clarified.  Lindell Spar, NP, pmhnp, fnp-bc 12/22/2020, 3:19 PM Patient ID: Crisoforo Oxford, male   DOB: 1995-04-30, 25 y.o.   MRN: [id number] Patient ID: Kieren Adkison, male   DOB: 10/16/1995, 25 y.o.   MRN: [id number]

## 2020-12-22 NOTE — Progress Notes (Signed)
Recreation Therapy Notes  Date: 9.2.22 Time: 1000 Location: [address] Hall   Group Topic: Goal Setting  Goal Area(s) Addresses:  Patient will participate in discussion of what a goal is. Patient will successfully complete sheet on goa planning.  Behavioral Response: Minimal  Intervention: Worksheet  Activity: LRT completed one on one sessions with patients on the hall.  LRT spoke with pts about what goals are and what the acronym SMART stands for; specific, measurable, attainable, relevant, and time-bound. Pt were given a worksheet to fill out that focused on planning goals within a week, month, year and five years.  Pts were to also address what would prevent them from reaching their goals, what they need to help reach goals and what can they do immediately to work towards goals.  Education: Following directions, Education on Goal Setting  Education Outcome: Acknowledges education  Clinical Observations/Feedback: In discussion with pt, pt stated a goal was an "achievement".  Pt expressed his goals for a week and month were to workout.  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  When asked what would prevent reaching this goal, pt stated having low testosterone.  Pt identified needing steroids as something that would help achieve goal and pt could continue working out to complete goal.    Caroll Rancher, LRT/CTRS     Caroll Rancher A 12/22/2020 11:57 AM

## 2020-12-22 NOTE — [facility] Group Notes (Signed)
[facility] LCSW Group Therapy   12/22/2020 11:19 AM    Type of Therapy and Topic:  Group Therapy:  Strengths Exploration   Participation Level: Did Not Attend  Description of Group: This group allows individuals to explore their strengths, learn to use strengths in new ways to improve well-being. Strengths-based interventions involve identifying strengths, understanding how they are used, and learning new ways to apply them. Individuals will identify their strengths, and then explore their roles in different areas of life (relationships, professional life, and personal fulfillment). Individuals will think about ways in which they currently use their strengths, along with new ways they could begin using them.    Therapeutic Goals Patient will verbalize two of their strengths Patient will identify how their strengths are currently used Patient will identify two new ways to apply their strengths  Patients will create a plan to apply their strengths in their daily lives     Summary of Patient Progress:  Did not attend       Therapeutic Modalities Cognitive Behavioral Therapy Motivational Interviewing

## 2020-12-22 NOTE — Plan of Care (Signed)
Has remained calm and cooperative but continues to appear preoccupied.  Denies SI/HI. Appetite is good. Reports that he has been sleeping fine. No sign of distress. Encouragements and support provided. Safety monitored as expected.

## 2020-12-22 NOTE — [facility] Group Notes (Signed)
[facility] Group Notes:  (Nursing/MHT/Case Management/Adjunct)  Date:  12/22/2020  Time:  1:55 PM  Type of Therapy:  Nurse Education  Participation Level:  Did Not Attend    Audrie Lia Jo Booze 12/22/2020, 1:55 PM

## 2020-12-23 DIAGNOSIS — F250 Schizoaffective disorder, bipolar type: Secondary | ICD-10-CM | POA: Diagnosis not present

## 2020-12-23 NOTE — Progress Notes (Signed)
[facility] MD Progress Note  12/23/2020 7:05 AM Makyi Ledo  MRN:  [id number]  Chief Complaint: psychosis  Reason for admission: Patient is a 25 year old male with history of schizoaffective disorder admitted for insomnia x 5 days, aggressive behavior, delusions, suicidal ideation and auditory hallucinations.The patient is currently on Hospital Day 20.   Chart Review from last 24 hours:  The patient's chart was reviewed and nursing notes were reviewed. The patient's case was discussed in multidisciplinary team meeting. Per nursing, patient attended RT group but no other groups. He had no acute behavioral issues or acute safety concerns noted. Per [facility] he was compliant with scheduled medications and did receive Vistaril X1 and Doxepin X1 for sleep.   Information Obtained Today During Patient Interview: The patient was seen and evaluated on the unit. On assessment today the patient reports that his mood is "neutral" and he is "feeling good" today. 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 He denies thought broadcasting/insertion/withdrawal or SI or HI. He states he is eating and sleeping well and voices no physical concerns. He denies medication side-effects. He was made aware that his 2nd loading dose of Invega sustenna is due tomorrow and he agrees with 2nd shot.   Principal Problem: Schizoaffective disorder (HCC) Diagnosis: Principal Problem:   Schizoaffective disorder (HCC)  Total Time Spent in Direct Patient Care:  I personally spent 25 minutes on the unit in direct patient care. The direct patient care time included face-to-face time with the patient, reviewing the patient's chart, communicating with other professionals, and coordinating care. Greater than 50% of this time was spent in  counseling or coordinating care with the patient regarding goals of hospitalization, psycho-education, and discharge planning needs.  Past Psychiatric History: See admission H&P  Past Medical History:  Past Medical History:  Diagnosis Date   Elevated CPK    per patient   Schizophrenia [facility])    History reviewed. No pertinent surgical history. Family History:  Family History  Problem Relation Age of Onset   Mental illness Brother    Family Psychiatric  History: See admission H&P  Social History:  Social History   Substance and Sexual Activity  Alcohol Use Never     Social History   Substance and Sexual Activity  Drug Use Never    Social History   Socioeconomic History   Marital status: Single    Spouse name: Not on file   Number of children: Not on file   Years of education: Not on file   Highest education level: Not on file  Occupational History   Not on file  Tobacco Use   Smoking status: Never   Smokeless tobacco: Never  Substance and Sexual Activity   Alcohol use: Never   Drug use: Never   Sexual activity: Not on file  Other Topics Concern   Not on file  Social History Narrative   Not on file   Social Determinants of Health   Financial Resource Strain: Not on file  Food Insecurity: Not on file  Transportation Needs: Not on file  Physical Activity: Not on file  Stress: Not on file  Social Connections: Not on file   Additional Social History:   Sleep: Good  Appetite:  Good  Current Medications: Current Facility-Administered Medications  Medication Dose Route  Frequency Provider Last Rate Last Admin   acetaminophen (TYLENOL) tablet 650 mg  650 mg Oral Q6H PRN Rankin, Shuvon B, NP   650 mg at 12/14/20 0203   alum & mag hydroxide-simeth (MAALOX/MYLANTA) 200-200-20 MG/5ML suspension 30 mL  30 mL Oral Q4H PRN Rankin, Shuvon B, NP       benztropine (COGENTIN) tablet 0.5 mg  0.5 mg Oral Q6H PRN Claudie Revering, MD       diphenhydrAMINE (BENADRYL)  injection 50 mg  50 mg Intramuscular Q6H PRN Claudie Revering, MD       doxepin (SINEQUAN) capsule 10 mg  10 mg Oral QHS PRN Claudie Revering, MD   10 mg at 12/22/20 2053   gabapentin (NEURONTIN) capsule 300 mg  300 mg Oral QHS Claudie Revering, MD   300 mg at 12/22/20 2053   hydrOXYzine (ATARAX/VISTARIL) tablet 25 mg  25 mg Oral Q6H PRN Claudie Revering, MD   25 mg at 12/22/20 2053   LORazepam (ATIVAN) injection 2 mg  2 mg Intramuscular BID PRN Claudie Revering, MD       LORazepam (ATIVAN) tablet 1 mg  1 mg Oral Q6H PRN Claudie Revering, MD   1 mg at 12/16/20 2103   magnesium hydroxide (MILK OF MAGNESIA) suspension 30 mL  30 mL Oral Daily PRN Rankin, Shuvon B, NP       metFORMIN (GLUCOPHAGE) tablet 500 mg  500 mg Oral BID WC Rankin, Shuvon B, NP   500 mg at 12/22/20 1657   Oxcarbazepine (TRILEPTAL) tablet 300 mg  300 mg Oral BID Rankin, Shuvon B, NP   300 mg at 12/22/20 1657   [START ON 12/24/2020] paliperidone (INVEGA SUSTENNA) injection 156 mg  156 mg Intramuscular Q28 days Claudie Revering, MD       polyethylene glycol (MIRALAX / GLYCOLAX) packet 17 g  17 g Oral Daily Claudie Revering, MD   17 g at 12/22/20 0809   propranolol (INDERAL) tablet 40 mg  40 mg Oral Q8H Claudie Revering, MD   40 mg at 12/23/20 0549   risperiDONE (RISPERDAL M-TABS) disintegrating tablet 3 mg  3 mg Oral QHS Claudie Revering, MD   3 mg at 12/22/20 2058   risperiDONE (RISPERDAL M-TABS) disintegrating tablet 3 mg  3 mg Oral Daily Claudie Revering, MD   3 mg at 12/22/20 8099   Lab Results:  Results for orders placed or performed during the hospital encounter of 12/03/20 (from the past 48 hour(s))  POC SARS Coronavirus 2 Ag     Status: None   Collection Time: 12/22/20  3:17 PM  Result Value Ref Range   SARSCOV2ONAVIRUS 2 AG NEGATIVE NEGATIVE    Comment: (NOTE) SARS-CoV-2 antigen NOT DETECTED.   Negative results are presumptive.  Negative results do not preclude SARS-CoV-2 infection and should not be used as the sole basis  for treatment or other patient management decisions, including infection  control decisions, particularly in the presence of clinical signs and  symptoms consistent with COVID-19, or in those who have been in contact with the virus.  Negative results must be combined with clinical observations, patient history, and epidemiological information. The expected result is Negative.  Fact Sheet for Patients: https://www.jennings-kim.com/  Fact Sheet for Healthcare Providers: https://alexander-rogers.biz/  This test is not yet approved or cleared by the [place] FDA and  has been authorized for detection and/or diagnosis of SARS-CoV-2 by FDA under an Emergency Use Authorization (EUA).  This  EUA will remain in effect (meaning this test can be used) for the duration of  the COV ID-19 declaration under Section 564(b)(1) of the Act, 21 U.S.C. section 360bbb-3(b)(1), unless the authorization is terminated or revoked sooner.       Blood Alcohol level:  Lab Results  Component Value Date   ETH <10 11/29/2020   ETH <10 11/07/2020   Metabolic Disorder Labs: Lab Results  Component Value Date   HGBA1C 5.5 12/04/2020   MPG 111.15 12/04/2020   No results found for: PROLACTIN Lab Results  Component Value Date   CHOL 123 12/02/2020   TRIG 74 12/02/2020   HDL 35 (L) 12/02/2020   CHOLHDL 3.5 12/02/2020   VLDL 15 12/02/2020   LDLCALC 73 12/02/2020   LDLCALC 101 (H) 06/03/2019   Physical Findings: AIMS: Facial and Oral Movements Muscles of Facial Expression: None, normal Lips and Perioral Area: None, normal Jaw: None, normal Tongue: None, normal,Extremity Movements Upper (arms, wrists, hands, fingers): None, normal Lower (legs, knees, ankles, toes): None, normal, Trunk Movements Neck, shoulders, hips: None, normal, Overall Severity Severity of abnormal movements (highest score from questions above): None, normal Incapacitation due to abnormal movements:  None, normal Patient's awareness of abnormal movements (rate only patient's report): No Awareness, Dental Status Current problems with teeth and/or dentures?: No Does patient usually wear dentures?: No      Musculoskeletal: Strength & Muscle Tone: within normal limits Gait & Station: normal Patient leans: N/A  Psychiatric Specialty Exam: Physical Exam Vitals reviewed.  HENT:     Head: Normocephalic.  Pulmonary:     Effort: Pulmonary effort is normal.  Neurological:     General: No focal deficit present.     Mental Status: He is alert.    Review of Systems  Constitutional:  Negative for fever.  Respiratory:  Negative for shortness of breath.   Cardiovascular:  Negative for chest pain.  Gastrointestinal:  Negative for constipation, diarrhea, nausea and vomiting.   Blood pressure (!) 132/59, pulse 90, temperature 98.3 F (36.8 C), temperature source Oral, resp. rate 16, height 5\' 6"  (1.676 m), weight 134.3 kg, SpO2 99 %.Body mass index is 47.78 kg/m.  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; denies AVH or first rank symptoms, endorses belief he receives messages as "an empath" - is not grossly responding to internal/external stimuli on exam  Suicidal Thoughts:  No  Homicidal Thoughts:  No  Memory:  Recent;   Fair  Judgement:  Fair  Insight:  Lacking  Psychomotor Activity:  Normal  Concentration:  Concentration: Fair and Attention Span: Fair  Recall:  FiservFair  Fund of Knowledge:  Fair  Language:  Good  Akathisia:  Negative  Assets:  Desire for  Improvement Resilience  ADL's:  Intact  Cognition:  overall WNL  Sleep:  Number of Hours: 6.5   Treatment Plan Summary: Patient is a 25 year old male with schizoaffective disorder, bipolar type admitted for treatment and stabilization of worsening psychotic and mood symptoms, suicidal ideation and aggressive behavior.   He is tolerating  treatment with risperidone and received first loading dose of Invega Sustenna LAI 12/20/20. He is due for 2nd loading dose tomorrow and appears clinically to be stabilizing.  Daily contact with patient to assess and evaluate symptoms and progress in treatment and Medication management  Schizoaffective disorder -Continue Risperdal M tabs 3 mg PO QAM and 3 mg PO QHS for psychotic symptoms -Invega Sustenna LAI.  First loading dose of 234 mg IM administered on 12/20/2020. -Continue Second Invega loading dose of 156 mg IM due on 12/24/2020.  Plan is for q. 28-day dosing thereafter. -Continue Trileptal 300 mg twice daily for mood stabilization  Anxiety/EPS -Continue propranolol 40 mg Q8H for restlessness and anxiety -Continue benztropine 0.5 mg PO Q6H PRN for tremors, muscle stiffness -Continue diphenhydramine 50 mg IM every 6 hours as needed for acute dystonic reaction  Diabetes Mellitus -Continue metformin 500 mg twice daily with meals  Agitation  -Continue lorazepam 1 mg PO Q6H PRN anxiety, restlessness, agitation -Continue lorazepam 2 mg IM BID PRN for severe agitation only if patient unable to take oral PRN medications for agitation  Anxiety -Continue hydroxyzine 25 mg Q6H PRN  Insomnia -Continue gabapentin 300 mg at bedtime for RLS, sleep -Continue doxepin 10 mg at bedtime as needed for sleep.  Constipation -Continue MiraLAX 17 g daily -Continue MOM 30 mL daily PRN -Encourage intake of p.o. fluids  Discharge planning in progress.  Social work has encouraged family to petition for guardian to be appointed for patient.  Housing plan needs to be  clarified.  Comer Locket, MD, FAPA 12/23/2020, 7:05 AM

## 2020-12-23 NOTE — Progress Notes (Addendum)
   12/23/20 1100  Psych Admission Type (Psych Patients Only)  Admission Status Involuntary  Psychosocial Assessment  Patient Complaints Anxiety  Eye Contact Fair  Facial Expression Fixed smile  Affect Appropriate to circumstance  Speech Soft  Interaction Assertive  Motor Activity Slow  Appearance/Hygiene Unremarkable  Behavior Characteristics Cooperative  Mood Pleasant;Preoccupied  Thought Process  Coherency WDL  Content Preoccupation  Delusions None reported or observed  Perception Hallucinations  Hallucination None reported or observed  Judgment Poor  Confusion None  Danger to Self  Current suicidal ideation? Denies  Danger to Others  Danger to Others None reported or observed

## 2020-12-23 NOTE — Group Note (Signed)
Clinical Social Work Note  No group could be held today due to a COVID-19 outbreak on the unit.  An educational packet about anger management was provided to the patient to work on individually.  Shaylon Aden Grossman-Orr, LCSW 12/23/2020, 8:57 AM    

## 2020-12-23 NOTE — Progress Notes (Signed)
   12/23/20 0000  Psych Admission Type (Psych Patients Only)  Admission Status Involuntary  Psychosocial Assessment  Patient Complaints None  Eye Contact Fair  Facial Expression Fixed smile  Affect Appropriate to circumstance  Speech Soft  Interaction Assertive  Motor Activity Slow  Appearance/Hygiene Unremarkable  Behavior Characteristics Cooperative  Mood Preoccupied;Pleasant  Thought Process  Coherency WDL  Content Preoccupation  Delusions None reported or observed  Perception Hallucinations  Hallucination None reported or observed  Judgment Poor  Confusion None  Danger to Self  Current suicidal ideation? Denies  Danger to Others  Danger to Others None reported or observed

## 2020-12-23 NOTE — B[facility] Group Notes (Signed)
Adult Psychoeducational Group Note  Date:  12/23/2020 Time:  12:23 PM  Group Topic/Focus:  Goals Group:   The focus of this group is to help patients establish daily goals to achieve during treatment and discuss how the patient can incorporate goal setting into their daily lives to aide in recovery.  Participation Level:  Active  Participation Quality:  Attentive  Affect:  Appropriate  Cognitive:  Appropriate  Insight: Appropriate  Engagement in Group:  Engaged  Modes of Intervention:  Discussion  Additional Comments:  Patient attended goals group today, stayed appropriate and attentive the duration of the group. The patient's goal was to get a workout done today.   Ruairi Stutsman T Infinity Jeffords 12/23/2020, 12:23 PM

## 2020-12-23 NOTE — BH IP Treatment Plan (Deleted)
Interdisciplinary Treatment and Diagnostic Plan Update  12/23/2020 Time of Session: *** Noah Ramsey MRN: [id number]  Principal Diagnosis: Schizoaffective disorder [facility])  Secondary Diagnoses: Principal Problem:   Schizoaffective disorder (HCC)   Current Medications:  Current Facility-Administered Medications  Medication Dose Route Frequency Provider Last Rate Last Admin   acetaminophen (TYLENOL) tablet 650 mg  650 mg Oral Q6H PRN Rankin, Shuvon B, NP   650 mg at 12/14/20 0203   alum & mag hydroxide-simeth (MAALOX/MYLANTA) 200-200-20 MG/5ML suspension 30 mL  30 mL Oral Q4H PRN Rankin, Shuvon B, NP       benztropine (COGENTIN) tablet 0.5 mg  0.5 mg Oral Q6H PRN Claudie Revering, MD       diphenhydrAMINE (BENADRYL) injection 50 mg  50 mg Intramuscular Q6H PRN Claudie Revering, MD       doxepin (SINEQUAN) capsule 10 mg  10 mg Oral QHS PRN Claudie Revering, MD   10 mg at 12/22/20 2053   gabapentin (NEURONTIN) capsule 300 mg  300 mg Oral QHS Claudie Revering, MD   300 mg at 12/22/20 2053   hydrOXYzine (ATARAX/VISTARIL) tablet 25 mg  25 mg Oral Q6H PRN Claudie Revering, MD   25 mg at 12/22/20 2053   LORazepam (ATIVAN) injection 2 mg  2 mg Intramuscular BID PRN Claudie Revering, MD       LORazepam (ATIVAN) tablet 1 mg  1 mg Oral Q6H PRN Claudie Revering, MD   1 mg at 12/16/20 2103   magnesium hydroxide (MILK OF MAGNESIA) suspension 30 mL  30 mL Oral Daily PRN Rankin, Shuvon B, NP       metFORMIN (GLUCOPHAGE) tablet 500 mg  500 mg Oral BID WC Rankin, Shuvon B, NP   500 mg at 12/23/20 0900   Oxcarbazepine (TRILEPTAL) tablet 300 mg  300 mg Oral BID Rankin, Shuvon B, NP   300 mg at 12/23/20 0900   [START ON 12/24/2020] paliperidone (INVEGA SUSTENNA) injection 156 mg  156 mg Intramuscular Q28 days Claudie Revering, MD       polyethylene glycol (MIRALAX / GLYCOLAX) packet 17 g  17 g Oral Daily Claudie Revering, MD   17 g at 12/23/20 0900   propranolol (INDERAL) tablet 40 mg  40 mg Oral Q8H Claudie Revering,  MD   40 mg at 12/23/20 1414   risperiDONE (RISPERDAL M-TABS) disintegrating tablet 3 mg  3 mg Oral QHS Claudie Revering, MD   3 mg at 12/22/20 2058   risperiDONE (RISPERDAL M-TABS) disintegrating tablet 3 mg  3 mg Oral Daily Claudie Revering, MD   3 mg at 12/23/20 0900   PTA Medications: Medications Prior to Admission  Medication Sig Dispense Refill Last Dose   diazepam (VALIUM) 5 MG tablet Take 5 mg by mouth 2 (two) times daily as needed for anxiety.      haloperidol (HALDOL) 10 MG tablet Take 10 mg by mouth at bedtime. (Patient not taking: No sig reported)      haloperidol decanoate (HALDOL DECANOATE) 100 MG/ML injection Inject 100 mg into the muscle every 28 (twenty-eight) days. (Patient not taking: No sig reported)      LATUDA 40 MG TABS tablet Take 40 mg by mouth at bedtime.      metFORMIN (GLUCOPHAGE) 500 MG tablet Take 1 tablet (500 mg total) by mouth 2 (two) times daily with a meal. (Patient not taking: No sig reported) 180 tablet 3    OVER THE COUNTER MEDICATION Ripped Fat  Burner (diet pill) (Patient not taking: Reported on 12/06/2020)   Not Taking   OVER THE COUNTER MEDICATION Yohimbine (diet pill) (Patient not taking: Reported on 12/06/2020)   Not Taking   propranolol (INDERAL) 40 MG tablet Take 40 mg by mouth 3 (three) times daily.       Patient Stressors: Financial difficulties Medication change or noncompliance  Patient Strengths: Manufacturing systems engineer Supportive family/friends  Treatment Modalities: Medication Management, Group therapy, Case management,  1 to 1 session with clinician, Psychoeducation, Recreational therapy.   Physician Treatment Plan for Primary Diagnosis: Schizoaffective disorder (HCC) Long Term Goal(s): Improvement in symptoms so as ready for discharge   Short Term Goals: Ability to identify changes in lifestyle to reduce recurrence of condition will improve Ability to verbalize feelings will improve Ability to disclose and discuss suicidal ideas Ability  to demonstrate self-control will improve Ability to identify and develop effective coping behaviors will improve Ability to maintain clinical measurements within normal limits will improve Compliance with prescribed medications will improve  Medication Management: Evaluate patient's response, side effects, and tolerance of medication regimen.  Therapeutic Interventions: 1 to 1 sessions, Unit Group sessions and Medication administration.  Evaluation of Outcomes: {BHH Tx Plan Outcomes:30414004}  Physician Treatment Plan for Secondary Diagnosis: Principal Problem:   Schizoaffective disorder (HCC)  Long Term Goal(s): Improvement in symptoms so as ready for discharge   Short Term Goals: Ability to identify changes in lifestyle to reduce recurrence of condition will improve Ability to verbalize feelings will improve Ability to disclose and discuss suicidal ideas Ability to demonstrate self-control will improve Ability to identify and develop effective coping behaviors will improve Ability to maintain clinical measurements within normal limits will improve Compliance with prescribed medications will improve     Medication Management: Evaluate patient's response, side effects, and tolerance of medication regimen.  Therapeutic Interventions: 1 to 1 sessions, Unit Group sessions and Medication administration.  Evaluation of Outcomes: {B[facility] Tx Plan Outcomes:30414004}   RN Treatment Plan for Primary Diagnosis: Schizoaffective disorder (HCC) Long Term Goal(s): {B[facility] RN Tx Plan Long Term Goals:30414009::"Knowledge of disease and therapeutic regimen to maintain health will improve"}  Short Term Goals: {B[facility] RN Tx Plan Short Term FIEPP:29518841}  Medication Management: RN will administer medications as ordered by provider, will assess and evaluate patient's response and provide education to patient for prescribed medication. RN will report any adverse and/or side effects to prescribing  provider.  Therapeutic Interventions: 1 on 1 counseling sessions, Psychoeducation, Medication administration, Evaluate responses to treatment, Monitor vital signs and CBGs as ordered, Perform/monitor CIWA, COWS, AIMS and Fall Risk screenings as ordered, Perform wound care treatments as ordered.  Evaluation of Outcomes: {B[facility] Tx Plan Outcomes:30414004}   LCSW Treatment Plan for Primary Diagnosis: Schizoaffective disorder (HCC) Long Term Goal(s): Safe transition to appropriate next level of care at discharge, Engage patient in therapeutic group addressing interpersonal concerns.  Short Term Goals: {B[facility] LCSW TX PLAN SHORT TERM GOALS:30414010::"Engage patient in aftercare planning with referrals and resources"}  Therapeutic Interventions: Assess for all discharge needs, 1 to 1 time with Social worker, Explore available resources and support systems, Assess for adequacy in community support network, Educate family and significant other(s) on suicide prevention, Complete Psychosocial Assessment, Interpersonal group therapy.  Evaluation of Outcomes: {B[facility] Tx Plan Outcomes:30414004}   Progress in Treatment: Attending groups: {[facility] ADULT:22608} Participating in groups: {[facility] ADULT:22608} Taking medication as prescribed: {[facility] ADULT:22608} Toleration medication: {B[facility] ADULT:22608} Family/Significant other contact made: {YES/NO/CONTACT:22665} Patient understands diagnosis: {B[facility] YSAYT:01601} Discussing patient identified problems/goals with staff: {  [facility] ELFYB:01751} Medical problems stabilized or resolved: {[facility] ADULT:22608} Denies suicidal/homicidal ideation: {B[facility] ADULT:22608} Issues/concerns per patient self-inventory: {[facility] ADULT:22608} Other: ***  New problem(s) identified: {B[facility] NEW PROBLEMS:22609}  New Short Term/Long Term Goal(s):  Patient Goals:    Discharge Plan or Barriers:   Reason for Continuation of Hospitalization: {B[facility] Reasons for continued hospitalization:22604}  Estimated Length of  Stay:   Scribe for Treatment Team: Otelia Santee, LCSW 12/23/2020 4:02 PM

## 2020-12-23 NOTE — [facility] Group Notes (Signed)
Patient received material for group today.  

## 2020-12-23 NOTE — [facility] Group Notes (Signed)
Relaxation group-music therapy 

## 2020-12-24 DIAGNOSIS — F250 Schizoaffective disorder, bipolar type: Secondary | ICD-10-CM | POA: Diagnosis not present

## 2020-12-24 NOTE — Plan of Care (Signed)
Cooperative and denying thoughts of self-harm. Received Invega sustenna 156mg  IM in the right gluteus and tolerated well.

## 2020-12-24 NOTE — [facility] Group Notes (Signed)
Relaxation-music therapy 

## 2020-12-24 NOTE — Group Note (Signed)
Clinical Social Work Note  Group was not held in person due to COVID-19 outbreak and isolation requirements on the unit. CSW provided handouts on building healthy supports to work on individually in rooms.  Darvis Croft Grossman-Orr, LCSW 12/24/2020, 12:19 PM    

## 2020-12-24 NOTE — Progress Notes (Signed)
[facility] MD Progress Note  12/24/2020 12:56 PM Noah Ramsey  MRN:  [id number]  Chief Complaint: psychosis  Reason for admission: Patient is a 25 year old male with history of schizoaffective disorder admitted for insomnia x 5 days, aggressive behavior, delusions, suicidal ideation and auditory hallucinations.The patient is currently on Hospital Day 21.   Chart Review from last 24 hours:  The patient's chart was reviewed and nursing notes were reviewed. The patient's case was discussed in multidisciplinary team meeting. Per nursing, patient had no behavioral issues noted and no acute safety concerns. Per [facility] he was compliant with scheduled medications and did receive Vistaril X1 and Ativan po X1 for anxiety last night.   Information Obtained Today During Patient Interview: The patient was seen and evaluated on the unit. He states he tolerated his 2nd loading dose of Invega today without difficulty and denies medication side-effects. He denies SI, HI, AVH, or paranoia. He continues to have delusions that he has a PhD in Pharmacology today and that he gets "messages" because he is an empath. When asked about more about the messages, he cannot describe them. He reports stable sleep and appetite. He voices no physical complaints.   Principal Problem: Schizoaffective disorder (HCC) Diagnosis: Principal Problem:   Schizoaffective disorder (HCC)  Total Time Spent in Direct Patient Care:  I personally spent 20 minutes on the unit in direct patient care. The direct patient care time included face-to-face time with the patient, reviewing the patient's chart, communicating with other professionals, and coordinating care. Greater than 50% of this time was spent in counseling or coordinating care with the patient regarding goals of hospitalization, psycho-education, and discharge planning needs.  Past Psychiatric History: See admission H&P  Past Medical History:  Past Medical History:  Diagnosis Date    Elevated CPK    per patient   Schizophrenia [facility])    History reviewed. No pertinent surgical history. Family History:  Family History  Problem Relation Age of Onset   Mental illness Brother    Family Psychiatric  History: See admission H&P  Social History:  Social History   Substance and Sexual Activity  Alcohol Use Never     Social History   Substance and Sexual Activity  Drug Use Never    Social History   Socioeconomic History   Marital status: Single    Spouse name: Not on file   Number of children: Not on file   Years of education: Not on file   Highest education level: Not on file  Occupational History   Not on file  Tobacco Use   Smoking status: Never   Smokeless tobacco: Never  Substance and Sexual Activity   Alcohol use: Never   Drug use: Never   Sexual activity: Not on file  Other Topics Concern   Not on file  Social History Narrative   Not on file   Social Determinants of Health   Financial Resource Strain: Not on file  Food Insecurity: Not on file  Transportation Needs: Not on file  Physical Activity: Not on file  Stress: Not on file  Social Connections: Not on file   Additional Social History:   Sleep: Good  Appetite:  Good  Current Medications: Current Facility-Administered Medications  Medication Dose Route Frequency Provider Last Rate Last Admin   acetaminophen (TYLENOL) tablet 650 mg  650 mg Oral Q6H PRN Rankin, Shuvon B, NP   650 mg at 12/14/20 0203   alum & mag hydroxide-simeth (MAALOX/MYLANTA) 200-200-20 MG/5ML suspension 30 mL  30  mL Oral Q4H PRN Rankin, Shuvon B, NP       benztropine (COGENTIN) tablet 0.5 mg  0.5 mg Oral Q6H PRN Claudie Revering, MD       diphenhydrAMINE (BENADRYL) injection 50 mg  50 mg Intramuscular Q6H PRN Claudie Revering, MD       doxepin (SINEQUAN) capsule 10 mg  10 mg Oral QHS PRN Claudie Revering, MD   10 mg at 12/22/20 2053   gabapentin (NEURONTIN) capsule 300 mg  300 mg Oral QHS Claudie Revering, MD   300  mg at 12/23/20 2047   hydrOXYzine (ATARAX/VISTARIL) tablet 25 mg  25 mg Oral Q6H PRN Claudie Revering, MD   25 mg at 12/23/20 2047   LORazepam (ATIVAN) injection 2 mg  2 mg Intramuscular BID PRN Claudie Revering, MD       LORazepam (ATIVAN) tablet 1 mg  1 mg Oral Q6H PRN Claudie Revering, MD   1 mg at 12/23/20 2047   magnesium hydroxide (MILK OF MAGNESIA) suspension 30 mL  30 mL Oral Daily PRN Rankin, Shuvon B, NP       metFORMIN (GLUCOPHAGE) tablet 500 mg  500 mg Oral BID WC Rankin, Shuvon B, NP   500 mg at 12/24/20 0824   Oxcarbazepine (TRILEPTAL) tablet 300 mg  300 mg Oral BID Rankin, Shuvon B, NP   300 mg at 12/24/20 0824   paliperidone (INVEGA SUSTENNA) injection 156 mg  156 mg Intramuscular Q28 days Claudie Revering, MD   156 mg at 12/24/20 1001   polyethylene glycol (MIRALAX / GLYCOLAX) packet 17 g  17 g Oral Daily Claudie Revering, MD   17 g at 12/24/20 0824   propranolol (INDERAL) tablet 40 mg  40 mg Oral Q8H Claudie Revering, MD   40 mg at 12/24/20 0836   risperiDONE (RISPERDAL M-TABS) disintegrating tablet 3 mg  3 mg Oral QHS Claudie Revering, MD   3 mg at 12/23/20 2047   risperiDONE (RISPERDAL M-TABS) disintegrating tablet 3 mg  3 mg Oral Daily Claudie Revering, MD   3 mg at 12/24/20 8182   Lab Results:  Results for orders placed or performed during the hospital encounter of 12/03/20 (from the past 48 hour(s))  POC SARS Coronavirus 2 Ag     Status: None   Collection Time: 12/22/20  3:17 PM  Result Value Ref Range   SARSCOV2ONAVIRUS 2 AG NEGATIVE NEGATIVE    Comment: (NOTE) SARS-CoV-2 antigen NOT DETECTED.   Negative results are presumptive.  Negative results do not preclude SARS-CoV-2 infection and should not be used as the sole basis for treatment or other patient management decisions, including infection  control decisions, particularly in the presence of clinical signs and  symptoms consistent with COVID-19, or in those who have been in contact with the virus.  Negative results must  be combined with clinical observations, patient history, and epidemiological information. The expected result is Negative.  Fact Sheet for Patients: https://www.jennings-kim.com/  Fact Sheet for Healthcare Providers: https://alexander-rogers.biz/  This test is not yet approved or cleared by the [place] FDA and  has been authorized for detection and/or diagnosis of SARS-CoV-2 by FDA under an Emergency Use Authorization (EUA).  This EUA will remain in effect (meaning this test can be used) for the duration of  the COV ID-19 declaration under Section 564(b)(1) of the Act, 21 U.S.C. section 360bbb-3(b)(1), unless the authorization is terminated or revoked sooner.  Blood Alcohol level:  Lab Results  Component Value Date   ETH <10 11/29/2020   ETH <10 11/07/2020   Metabolic Disorder Labs: Lab Results  Component Value Date   HGBA1C 5.5 12/04/2020   MPG 111.15 12/04/2020   No results found for: PROLACTIN Lab Results  Component Value Date   CHOL 123 12/02/2020   TRIG 74 12/02/2020   HDL 35 (L) 12/02/2020   CHOLHDL 3.5 12/02/2020   VLDL 15 12/02/2020   LDLCALC 73 12/02/2020   LDLCALC 101 (H) 06/03/2019   Physical Findings: AIMS: Facial and Oral Movements Muscles of Facial Expression: None, normal Lips and Perioral Area: None, normal Jaw: None, normal Tongue: None, normal,Extremity Movements Upper (arms, wrists, hands, fingers): None, normal Lower (legs, knees, ankles, toes): None, normal, Trunk Movements Neck, shoulders, hips: None, normal, Overall Severity Severity of abnormal movements (highest score from questions above): None, normal Incapacitation due to abnormal movements: None, normal Patient's awareness of abnormal movements (rate only patient's report): No Awareness, Dental Status Current problems with teeth and/or dentures?: No Does patient usually wear dentures?: No      Musculoskeletal: Strength & Muscle Tone: within  normal limits Gait & Station: normal Patient leans: N/A  Psychiatric Specialty Exam: Physical Exam Vitals reviewed.  HENT:     Head: Normocephalic.  Pulmonary:     Effort: Pulmonary effort is normal.  Neurological:     General: No focal deficit present.     Mental Status: He is alert.    Review of Systems  Constitutional:  Negative for fever.  Respiratory:  Negative for shortness of breath.   Cardiovascular:  Negative for chest pain.  Gastrointestinal:  Negative for constipation, diarrhea, nausea and vomiting.   Blood pressure (!) 141/86, pulse 98, temperature 98.2 F (36.8 C), temperature source Oral, resp. rate 16, height 5\' 6"  (1.676 m), weight 134.3 kg, SpO2 98 %.Body mass index is 47.78 kg/m.  General Appearance:  casually dressed, fair hygiene  Eye Contact:  Good  Speech:  Clear and Coherent and Normal Rate  Volume:  Normal  Mood:   aloof  Affect:  Constricted  Thought Process:  superficially goal directed and linear  Orientation: oriented to city, self, and year but not month  Thought Content:  Has delusion that has a PhD and that he receives messages as "an empath"; denies AVH or paranoia; is not grossly responding to internal/external stimuli on exam  Suicidal Thoughts:  No  Homicidal Thoughts:  No  Memory:  Recent;   Fair  Judgement:  Fair - taking meds  Insight:  Lacking  Psychomotor Activity:  Normal, no cogwheeling, no stiffness, no tremor - AIMS - 0  Concentration:  Concentration: Fair and Attention Span: Fair  Recall:  FiservFair  Fund of Knowledge:  Fair  Language:  Good  Akathisia:  Negative  Assets:  Desire for Improvement Resilience  ADL's:  Intact  Cognition:  overall WNL  Sleep:  Number of Hours: 8   Treatment Plan Summary: Patient is a 25 year old male with schizoaffective disorder, bipolar type admitted for treatment and stabilization of worsening psychotic and mood symptoms, suicidal ideation and aggressive behavior.   He is tolerating treatment with  risperidone and appears to be clinically stabilizing with loading of [place].   Daily contact with patient to assess and evaluate symptoms and progress in treatment and Medication management  Schizoaffective disorder -Continue Risperdal M tabs 3 mg PO QAM and 3 mg PO QHS for psychotic symptoms -Invega Sustenna LAI.  First loading  dose of 234 mg IM administered on 12/20/2020 and received second Invega loading dose of 156 mg IM on 12/24/2020.  Plan is for q. 28-day dosing thereafter. -Continue Trileptal 300 mg twice daily for mood stabilization  Anxiety/EPS -Continue propranolol 40 mg Q8H for restlessness and anxiety -Continue benztropine 0.5 mg PO Q6H PRN for tremors, muscle stiffness -Continue diphenhydramine 50 mg IM every 6 hours as needed for acute dystonic reaction  Diabetes Mellitus -Continue metformin 500 mg twice daily with meals  Agitation  -Continue lorazepam 1 mg PO Q6H PRN anxiety, restlessness, agitation -Continue lorazepam 2 mg IM BID PRN for severe agitation only if patient unable to take oral PRN medications for agitation  Anxiety -Continue hydroxyzine 25 mg Q6H PRN  Insomnia -Continue gabapentin 300 mg at bedtime for RLS, sleep -Continue doxepin 10 mg at bedtime as needed for sleep.  Constipation -Continue MiraLAX 17 g daily -Continue MOM 30 mL daily PRN -Encourage intake of p.o. fluids  Discharge planning in progress.  Social work has encouraged family to petition for guardian to be appointed for patient.  Housing plan needs to be clarified.  Comer Locket, MD, FAPA 12/24/2020, 12:56 PM

## 2020-12-24 NOTE — [facility] Group Notes (Signed)
Patient receive material for group this morning 

## 2020-12-24 NOTE — [facility] Group Notes (Signed)
Patient received group material 

## 2020-12-25 DIAGNOSIS — F250 Schizoaffective disorder, bipolar type: Secondary | ICD-10-CM | POA: Diagnosis not present

## 2020-12-25 LAB — RESP PANEL BY RT-PCR (FLU A&B, COVID) ARPGX2
Influenza A by PCR: NEGATIVE
Influenza B by PCR: NEGATIVE
SARS Coronavirus 2 by RT PCR: NEGATIVE

## 2020-12-25 NOTE — Progress Notes (Addendum)
Noah Ramsey was pleasant this evening.  He denied any SI/HI or AVH.  He was, however, noted talking loudly in his room after his bedtime medication.  He questioned his hs medications but did eventually take them.  He denied any pain or discomfort and appeared to be in no physical distress.  He is currently resting with his eyes closed and appears to be asleep.  Q 15 minutes maintained for safety.   12/25/20 2126  Psych Admission Type (Psych Patients Only)  Admission Status Involuntary  Psychosocial Assessment  Patient Complaints None  Eye Contact Brief  Facial Expression Flat  Affect Preoccupied  Speech Soft  Interaction Avoidant  Motor Activity Slow  Appearance/Hygiene Unremarkable  Behavior Characteristics Cooperative;Appropriate to situation  Mood Labile  Thought Process  Coherency WDL  Content Preoccupation  Delusions None reported or observed  Perception Hallucinations  Hallucination None reported or observed  Judgment Poor  Confusion None  Danger to Self  Current suicidal ideation? Denies  Danger to Others  Danger to Others None reported or observed

## 2020-12-25 NOTE — [facility] Group Notes (Signed)
Topic:   Due to the acuity, complex discharge plans, and Covid-19 precautions, group was not held. Patient was provided therapeutic worksheets and asked to meet with CSW as needed. 

## 2020-12-25 NOTE — Progress Notes (Signed)
DAR Note: Patient with blunted affect and depressed mood, denied SI/HI/AVH earlier in shift, denied all concerns, all meds given as ordered. Pt is being maintained on q15 minute safety checks.   12/25/20 0108  Psych Admission Type (Psych Patients Only)  Admission Status Involuntary  Psychosocial Assessment  Patient Complaints None  Eye Contact Fair  Facial Expression Fixed smile  Affect Appropriate to circumstance  Speech Soft  Interaction Assertive  Motor Activity Slow  Appearance/Hygiene Unremarkable  Behavior Characteristics Cooperative  Mood Depressed  Thought Process  Coherency WDL  Content Preoccupation  Delusions WDL;None reported or observed  Perception Hallucinations  Hallucination None reported or observed  Judgment Poor  Confusion None  Danger to Self  Current suicidal ideation? Denies  Danger to Others  Danger to Others None reported or observed

## 2020-12-25 NOTE — Progress Notes (Signed)
Recreation Therapy Notes  Date: 9.5.22 Time: 1000 Location: [address]  Group Topic: Anger Management  Goal Area(s) Addresses:  Patient will identify situations that make them angry. Patient will identify positive coping skills for dealing with anger. Patient will identify benefit of using coping skills post d/c.  Behavioral Response: Engaged  Intervention: Worksheet  Activity: Conservation officer, historic buildings.  Patients were to identify at least 5 things that get them angry and rank them on a scale from 1-10, with 10 being the highest.  Patients would then come up with coping skills to help them deal with the situations they identified.   Education: Pharmacologist, Building control surveyor.   Education Outcome: Acknowledges understanding/In group clarification offered/Needs additional education.   Clinical Observations/Feedback:  Due to COVID restrictions, LRT went room to room and completed the worksheet with each person individually.  Pt was still saying he had dementia and couldn't complete the activity.  Pt completed the worksheet with a little hesitancy.  Pt rated not being able to workout a 9 and rated harassment- sexual or collateral as a 10.  Pt expressed his coping skills would be music, talking to someone, boxing/wrestling, cardio and working out.    Caroll Rancher, LRT/CTRS        Caroll Rancher A 12/25/2020 12:06 PM

## 2020-12-25 NOTE — Progress Notes (Signed)
Pt did not attend goals group. 

## 2020-12-25 NOTE — Progress Notes (Signed)
[facility] MD Progress Note  12/25/2020 5:52 PM Noah Ramsey  MRN:  [id number]  Reason for admission: Patient is a 25 year old male with history of schizoaffective disorder admitted for insomnia x 5 days, aggressive behavior, delusions, suicidal ideation and auditory hallucinations.  Objective: Medical record reviewed.  Patient's case discussed in detail with members of the treatment team.  I met with and evaluated the patient on the unit today for follow-up today.  Patient presents as unchanged from my last examination of him.  Denies medication side effects.  Patient denies SI, HI, AH, VH.  He continues to make delusional statements about being a demon and being "the [place]" and having special powers as the [employer].  Patient makes statements that antipsychotic medications are "not for demons" and are "poison."  He continues to be preoccupied with discussing weight loss and supplements.  Patient denies any physical problems.  He states that he is sleeping and eating well.  Staff document that he slept 5.75 hours last night.  Vital signs today include BP of 121/78, pulse of 103 and respirations of 20.  Repeat influenza A, influenza B and coronavirus testing performed today were negative.  Patient is taking scheduled medications as prescribed.  He received his second loading dose of Invega Sustenna LAI 156 mg IM yesterday.  Patient took hydroxyzine 25 mg x 1 at bedtime last night but required no other PRN medications yesterday or today thus far.  Principal Problem: Schizoaffective disorder ([place]) Diagnosis: Principal Problem:   Schizoaffective disorder ([place])  Total Time spent with patient:  25 minutes  Past Psychiatric History: See admission H&P  Past Medical History:  Past Medical History:  Diagnosis Date   Elevated CPK    per patient   Schizophrenia ([place])    History reviewed. No pertinent surgical history. Family History:  Family History  Problem Relation Age of Onset   Mental illness  Brother    Family Psychiatric  History: See admission H&P Social History:  Social History   Substance and Sexual Activity  Alcohol Use Never     Social History   Substance and Sexual Activity  Drug Use Never    Social History   Socioeconomic History   Marital status: Single    Spouse name: Not on file   Number of children: Not on file   Years of education: Not on file   Highest education level: Not on file  Occupational History   Not on file  Tobacco Use   Smoking status: Never   Smokeless tobacco: Never  Substance and Sexual Activity   Alcohol use: Never   Drug use: Never   Sexual activity: Not on file  Other Topics Concern   Not on file  Social History Narrative   Not on file   Social Determinants of Health   Financial Resource Strain: Not on file  Food Insecurity: Not on file  Transportation Needs: Not on file  Physical Activity: Not on file  Stress: Not on file  Social Connections: Not on file   Additional Social History:                         Sleep: Good  Appetite:  Good  Current Medications: Current Facility-Administered Medications  Medication Dose Route Frequency Provider Last Rate Last Admin   acetaminophen (TYLENOL) tablet 650 mg  650 mg Oral Q6H PRN Rankin, Shuvon B, NP   650 mg at 12/14/20 0203   alum & mag hydroxide-simeth (MAALOX/MYLANTA) 200-200-20 MG/5ML  suspension 30 mL  30 mL Oral Q4H PRN Rankin, Shuvon B, NP       benztropine (COGENTIN) tablet 0.5 mg  0.5 mg Oral Q6H PRN Arthor Captain, MD       diphenhydrAMINE (BENADRYL) injection 50 mg  50 mg Intramuscular Q6H PRN Arthor Captain, MD       doxepin (SINEQUAN) capsule 10 mg  10 mg Oral QHS PRN Arthor Captain, MD   10 mg at 12/22/20 2053   gabapentin (NEURONTIN) capsule 300 mg  300 mg Oral QHS Arthor Captain, MD   300 mg at 12/24/20 2036   hydrOXYzine (ATARAX/VISTARIL) tablet 25 mg  25 mg Oral Q6H PRN Arthor Captain, MD   25 mg at 12/24/20 2038   LORazepam (ATIVAN)  injection 2 mg  2 mg Intramuscular BID PRN Arthor Captain, MD       LORazepam (ATIVAN) tablet 1 mg  1 mg Oral Q6H PRN Arthor Captain, MD   1 mg at 12/23/20 2047   magnesium hydroxide (MILK OF MAGNESIA) suspension 30 mL  30 mL Oral Daily PRN Rankin, Shuvon B, NP       metFORMIN (GLUCOPHAGE) tablet 500 mg  500 mg Oral BID WC Rankin, Shuvon B, NP   500 mg at 12/25/20 1630   Oxcarbazepine (TRILEPTAL) tablet 300 mg  300 mg Oral BID Rankin, Shuvon B, NP   300 mg at 12/25/20 1630   paliperidone (INVEGA SUSTENNA) injection 156 mg  156 mg Intramuscular Q28 days Arthor Captain, MD   156 mg at 12/24/20 1001   polyethylene glycol (MIRALAX / GLYCOLAX) packet 17 g  17 g Oral Daily Arthor Captain, MD   17 g at 12/25/20 0827   propranolol (INDERAL) tablet 40 mg  40 mg Oral Q8H Arthor Captain, MD   40 mg at 12/25/20 1508   risperiDONE (RISPERDAL M-TABS) disintegrating tablet 3 mg  3 mg Oral QHS Arthor Captain, MD   3 mg at 12/24/20 2036   risperiDONE (RISPERDAL M-TABS) disintegrating tablet 3 mg  3 mg Oral Daily Arthor Captain, MD   3 mg at 12/25/20 9628    Lab Results:  Results for orders placed or performed during the hospital encounter of 12/03/20 (from the past 48 hour(s))  Resp Panel by RT-PCR (Flu A&B, Covid) Nasopharyngeal Swab     Status: None   Collection Time: 12/25/20  4:31 AM   Specimen: Nasopharyngeal Swab; Nasopharyngeal(NP) swabs in vial transport medium  Result Value Ref Range   SARS Coronavirus 2 by RT PCR NEGATIVE NEGATIVE    Comment: (NOTE) SARS-CoV-2 target nucleic acids are NOT DETECTED.  The SARS-CoV-2 RNA is generally detectable in upper respiratory specimens during the acute phase of infection. The lowest concentration of SARS-CoV-2 viral copies this assay can detect is 138 copies/mL. A negative result does not preclude SARS-Cov-2 infection and should not be used as the sole basis for treatment or other patient management decisions. A negative result may occur with  improper  specimen collection/handling, submission of specimen other than nasopharyngeal swab, presence of viral mutation(s) within the areas targeted by this assay, and inadequate number of viral copies(<138 copies/mL). A negative result must be combined with clinical observations, patient history, and epidemiological information. The expected result is Negative.  Fact Sheet for Patients:  EntrepreneurPulse.com.au  Fact Sheet for Healthcare Providers:  IncredibleEmployment.be  This test is no t yet approved or cleared by the [place] FDA and  has been  authorized for detection and/or diagnosis of SARS-CoV-2 by FDA under an Emergency Use Authorization (EUA). This EUA will remain  in effect (meaning this test can be used) for the duration of the COVID-19 declaration under Section 564(b)(1) of the Act, 21 U.S.C.section 360bbb-3(b)(1), unless the authorization is terminated  or revoked sooner.       Influenza A by PCR NEGATIVE NEGATIVE   Influenza B by PCR NEGATIVE NEGATIVE    Comment: (NOTE) The Xpert Xpress SARS-CoV-2/FLU/RSV plus assay is intended as an aid in the diagnosis of influenza from Nasopharyngeal swab specimens and should not be used as a sole basis for treatment. Nasal washings and aspirates are unacceptable for Xpert Xpress SARS-CoV-2/FLU/RSV testing.  Fact Sheet for Patients: EntrepreneurPulse.com.au  Fact Sheet for Healthcare Providers: IncredibleEmployment.be  This test is not yet approved or cleared by the [place] FDA and has been authorized for detection and/or diagnosis of SARS-CoV-2 by FDA under an Emergency Use Authorization (EUA). This EUA will remain in effect (meaning this test can be used) for the duration of the COVID-19 declaration under Section 564(b)(1) of the Act, 21 U.S.C. section 360bbb-3(b)(1), unless the authorization is terminated or revoked.  Performed at [facility], [place] [address] [place], [place] [address]       Blood Alcohol level:  Lab Results  Component Value Date   [facility] <10 11/29/2020   ETH <10 09/81/1914    Metabolic Disorder Labs: Lab Results  Component Value Date   HGBA1C 5.5 12/04/2020   MPG 111.15 12/04/2020   No results found for: PROLACTIN Lab Results  Component Value Date   CHOL 123 12/02/2020   TRIG 74 12/02/2020   HDL 35 (L) 12/02/2020   CHOLHDL 3.5 12/02/2020   VLDL 15 12/02/2020   LDLCALC 73 12/02/2020   LDLCALC 101 (H) 06/03/2019    Physical Findings: AIMS: Facial and Oral Movements Muscles of Facial Expression: None, normal Lips and Perioral Area: None, normal Jaw: None, normal Tongue: None, normal,Extremity Movements Upper (arms, wrists, hands, fingers): None, normal Lower (legs, knees, ankles, toes): None, normal, Trunk Movements Neck, shoulders, hips: None, normal, Overall Severity Severity of abnormal movements (highest score from questions above): None, normal Incapacitation due to abnormal movements: None, normal Patient's awareness of abnormal movements (rate only patient's report): No Awareness, Dental Status Current problems with teeth and/or dentures?: No Does patient usually wear dentures?: No  CIWA:    COWS:     Musculoskeletal: Strength & Muscle Tone: within normal limits Gait & Station: normal Patient leans: N/A  Psychiatric Specialty Exam:  Presentation  General Appearance: Appropriate for Environment; Fairly Groomed  Eye Contact:Good  Speech:Normal Rate  Speech Volume:Normal  Handedness:Right   Mood and Affect  Mood:Euthymic  Affect:Constricted   Thought Process  Thought Processes:Disorganized  Descriptions of Associations:Loose  Orientation:Full (Time, Place and Person)  Thought Content:Delusions  History of Schizophrenia/Schizoaffective disorder:Yes  Duration of Psychotic Symptoms:Greater than six months  Hallucinations:Hallucinations:  Auditory  Ideas of Reference:Delusions  Suicidal Thoughts:Suicidal Thoughts: No  Homicidal Thoughts:Homicidal Thoughts: No   Sensorium  Memory:Immediate Good; Recent Good  Judgment:Impaired  Insight:Shallow   Executive Functions  Concentration:Fair  Attention Span:Fair  [place]  Language:Good   Psychomotor Activity  Psychomotor Activity:Psychomotor Activity: Normal   Assets  Assets:Communication Skills; Desire for Improvement; Housing; Social Support   Sleep  Sleep:Sleep: Good Number of Hours of Sleep: 5.75    Physical Exam: Physical Exam Vitals and nursing note reviewed.  Constitutional:  General: He is not in acute distress.    Appearance: He is not diaphoretic.  HENT:     Head: Normocephalic and atraumatic.  Pulmonary:     Effort: Pulmonary effort is normal.  Neurological:     General: No focal deficit present.     Mental Status: He is alert and oriented to person, place, and time.   Review of Systems  Constitutional:  Negative for chills, diaphoresis and fever.  HENT:  Negative for sore throat.   Respiratory:  Negative for cough and shortness of breath.   Cardiovascular:  Negative for chest pain and palpitations.  Gastrointestinal:  Negative for constipation, diarrhea, nausea and vomiting.  Musculoskeletal:  Positive for back pain. Negative for myalgias.       Positive for chronic low back pain  Neurological:  Negative for dizziness, seizures and headaches.  Psychiatric/Behavioral:  Positive for hallucinations. Negative for depression, substance abuse and suicidal ideas. The patient is nervous/anxious. The patient does not have insomnia.   Blood pressure 121/78, pulse (!) 103, temperature 98.2 F (36.8 C), temperature source Oral, resp. rate 20, height 5' 6" (1.676 m), weight 134.3 kg, SpO2 98 %. Body mass index is 47.78 kg/m.   Treatment Plan Summary: Patient is a 25 year old male with schizoaffective  disorder, bipolar type admitted for treatment and stabilization of worsening psychotic and mood symptoms, suicidal ideation and aggressive behavior.  Patient is displaying some improvement in organization of thought processes and is less exclusively focused on delusional themes.  Patient is tolerating treatment with oral risperidone and has tolerated the first 2 loading doses of Invega Sustenna LAI.  Patient insisted on receiving first two [place] LAI loading doses via gluteal rather than deltoid injection.  Will plan to bridge with oral Risperdal a bit longer than typical.  Daily contact with patient to assess and evaluate symptoms and progress in treatment and Medication management  Schizoaffective disorder -Continue Risperdal M tabs 3 mg PO QAM and 3 mg PO QHS for psychotic symptoms for now.  Plan is for taper and discontinuation of oral Risperdal and maintenance on Invega Sustenna LAI. -Continue Invega Sustenna LAI q. 28 days.  First loading dose of 234 mg IM administered 12/20/2020.  Second loading dose of 156 mg IM was administered on 12/24/2020.  Plan is for maintenance dosing of Invega Sustenna LAI 234 mg IM every 28 days with next dose due approximately 01/21/2021. -Continue Trileptal 300 mg twice daily for mood stabilization  Anxiety/EPS -Continue propranolol 40 mg Q8H for restlessness and anxiety -Continue benztropine 0.5 mg PO Q6H PRN for tremors, muscle stiffness -Continue diphenhydramine 50 mg IM every 6 hours as needed for acute dystonic reaction  Diabetes Mellitus -Continue metformin 500 mg twice daily with meals  Agitation  -Continue lorazepam 1 mg PO Q6H PRN anxiety, restlessness, agitation -Continue lorazepam 2 mg IM BID PRN for severe agitation only if patient unable to take oral PRN medications for agitation  Anxiety -Continue hydroxyzine 25 mg Q6H PRN  Insomnia -Continue gabapentin 300 mg at bedtime for RLS, sleep -Continue doxepin 10 mg at bedtime as needed  for sleep.  Constipation -Continue MiraLAX 17 g daily -Continue MOM 30 mL daily PRN -Encourage intake of p.o. fluids  Discharge planning in progress.  Social work has encouraged family to petition for guardian to be appointed for patient.  Housing plan needs to be clarified.  Arthor Captain, MD 12/25/2020, 5:52 PM

## 2020-12-25 NOTE — Progress Notes (Signed)
D: Patient observed pacing the hallway. He is compliant with his medications. Patient has been cooperative and has not needed any redirection from staff. His affect remains flat; blunted. He remains isolative to his room. His covid test resulted negative from this morning.  A: Continue to monitor medication management and MD orders.  Safety checks completed every 15 minutes per protocol.  Offer support and encouragement as needed.  R: Patient remains isolative; his behavior is appropriate.     12/25/20 1100  Psych Admission Type (Psych Patients Only)  Admission Status Involuntary  Psychosocial Assessment  Patient Complaints None  Eye Contact Brief  Facial Expression Flat  Affect Preoccupied  Speech Soft  Interaction Avoidant  Motor Activity Slow  Appearance/Hygiene Unremarkable  Behavior Characteristics Cooperative  Mood Labile  Thought Process  Coherency WDL  Content Preoccupation  Delusions None reported or observed  Perception Hallucinations  Hallucination None reported or observed  Judgment Poor  Confusion None  Danger to Self  Current suicidal ideation? Denies  Danger to Others  Danger to Others None reported or observed

## 2020-12-26 DIAGNOSIS — F250 Schizoaffective disorder, bipolar type: Secondary | ICD-10-CM | POA: Diagnosis not present

## 2020-12-26 LAB — COMPREHENSIVE METABOLIC PANEL
ALT: 32 U/L (ref 0–44)
AST: 40 U/L (ref 15–41)
Albumin: 3.8 g/dL (ref 3.5–5.0)
Alkaline Phosphatase: 83 U/L (ref 38–126)
Anion gap: 12 (ref 5–15)
BUN: 13 mg/dL (ref 6–20)
CO2: 22 mmol/L (ref 22–32)
Calcium: 8.9 mg/dL (ref 8.9–10.3)
Chloride: 99 mmol/L (ref 98–111)
Creatinine, Ser: 0.93 mg/dL (ref 0.61–1.24)
GFR, Estimated: >60 mL/min (ref >60)
Glucose, Bld: 114 mg/dL — ABNORMAL HIGH (ref 70–99)
Potassium: 3.8 mmol/L (ref 3.5–5.1)
Sodium: 133 mmol/L — ABNORMAL LOW (ref 135–145)
Total Bilirubin: 0.6 mg/dL (ref 0.3–1.2)
Total Protein: 7.5 g/dL (ref 6.5–8.1)

## 2020-12-26 LAB — TSH: TSH: 2.679 u[IU]/mL (ref 0.350–4.500)

## 2020-12-26 MED ORDER — TRAZODONE HCL 50 MG PO TABS: 50.0000 mg | ORAL_TABLET | Freq: Every evening | ORAL | Status: DC | PRN

## 2020-12-26 MED ORDER — HALOPERIDOL 5 MG PO TABS
5.0000 mg | ORAL_TABLET | Freq: Two times a day (BID) | ORAL | Status: DC
  Filled 2020-12-26 (×4): qty 1

## 2020-12-26 MED ORDER — RISPERIDONE 2 MG PO TBDP
2.0000 mg | ORAL_TABLET | Freq: Two times a day (BID) | ORAL | Status: DC
  Administered 2020-12-26 – 2020-12-27 (×2): 2 mg via ORAL
  Filled 2020-12-26 (×7): qty 1

## 2020-12-26 MED ORDER — PALIPERIDONE PALMITATE ER 234 MG/1.5ML IM SUSY: 234.0000 mg | PREFILLED_SYRINGE | INTRAMUSCULAR | Status: DC

## 2020-12-26 MED ORDER — DOXEPIN HCL 10 MG PO CAPS: 10.0000 mg | ORAL_CAPSULE | Freq: Every evening | ORAL | Status: DC | PRN

## 2020-12-26 MED ORDER — BENZTROPINE MESYLATE 0.5 MG PO TABS
0.5000 mg | ORAL_TABLET | Freq: Two times a day (BID) | ORAL | Status: DC
  Filled 2020-12-26 (×4): qty 1

## 2020-12-26 NOTE — Progress Notes (Addendum)
Pt confirmed poor sleep last night with good appetite, high energy and low concentration level. Pt visible in milieu at intervals for medications. Pt denies SI, HI, VH and pain when assessed. Observed actively responding to himself; talking with inappropriate laughter in his room and at the medication window.  Required multiple verbal redirections to comply with unit routines and safety needs. Flooded his room earlier and was resistive to care initially this morning "I'm trying to have a jacuzzi bath, don't tell me what to do. I don't want no medications because they are not good for me, Y'all need to just leave me alone right now". Eventually took his medications with multiple prompts. Support and encouragement offered to pt this shift. Q 15 minutes safety checks maintained on unit. All medications given with verbal education and effects monitored.  Pt remains safe on unit. Tolerates meals and fluids well. Denies discomfort at this time.

## 2020-12-26 NOTE — Progress Notes (Signed)
Recreation Therapy Notes  Date: 9.6.22 Time: 0930 Location: [address]  Group Topic: Anxiety  Goal Area(s) Addresses:  Patient will identify what triggers anxiety. Patient will identify symptoms of anxiety. Patient will identify coping skills for anxiety.  Behavioral Response: Resistant  Intervention: Worksheet  Activity:  LRT met with each patient 1:1 to discuss how they are affected by anxiety and the coping skills used when dealing anxiety.  Education: Radiographer, therapeutic, Dentist.   Education Outcome: Acknowledges understanding/In group clarification offered/Needs additional education.   Clinical Observations/Feedback: Pt was resistant to answer questions as he always is.  Pt tried to deflect by saying he has dementia.  LRT got the pt answer questions pertaining to anxiety.  Pt expressed triggers for anxiety are drugs, overdose and paranoia. Physical symptoms identified were a boost of adrenaline and muscles turn to jello.  Pt stated his coping skills for anxiety are comedy and working out.    Victorino Sparrow, LRT/CTRS    Victorino Sparrow A 12/26/2020 11:56 AM

## 2020-12-26 NOTE — B[facility] Counselor (Signed)
CSW coordinated call between pt and mother. Conversation went well. Pt states he is currently in 5 star hotel and states this will be a good place to take women too. Pt and mother reconnected and pt was asked when he would return.     Ruthann Cancer MSW, LCSW Clincal Social Worker  [facility]

## 2020-12-26 NOTE — Progress Notes (Signed)
Pt was given daily packet and discussed the goal for today.  Pt states that he would like to get some exercise and do some push-ups.

## 2020-12-26 NOTE — Progress Notes (Addendum)
[facility] MD Progress Note  12/26/2020 4:24 PM Noah Ramsey  MRN:  [id number]  Reason for admission: Patient is a 25 year old male with history of schizoaffective disorder admitted for insomnia x 5 days, aggressive behavior, delusions, suicidal ideation and auditory hallucinations.  Objective: Medical record reviewed.  Patient's case discussed in detail with members of the treatment team.  I met with and evaluated the patient on the unit today for follow-up today.  Patient seems more disorganized today.  He is alert and oriented to month, year, city, self and place.  Patient often provides nonresponsive answers to questions asked and has to be redirected frequently to return to topic.  His speech is intermittently garbled and dysarthric and some of his statements are difficult to understand.  He makes vague paranoid statements and perseverates on concerns that this writer is "a hybrid" or some other demon.  Patient states his belief that his social worker in mother have given up on him.  He makes multiple illogical medical statements regarding a variety of medical conditions and his recommended treatments for this condition.  Patient denies SI, AI, HI.  He does not respond to questions about AH or VH.  He reports good mood.  Patient denies trouble swallowing, trouble breathing, tongue thickness, muscle stiffness, tremor, trouble speaking.  He denies medication side effects or physical issues.  I encouraged patient to consider taking oral haloperidol instead of oral Risperdal in addition to [place] long-acting injectable.  Patient states he will not take Haldol because he believes it causes bone cancer and "because first-generation medications are not good for you".  Patient states that he is eating well and he slept okay.  Staff document the patient slept only 3 hours last night.  Vital signs today include BP 117/83 sitting and 118/71 standing; pulse 91 sitting and 97 standing; respirations 16; O2 sat  100% on room air and temperature of 97.7.  No new labs today.  Patient has been taking scheduled medications as prescribed.  He took PRN hydroxyzine 25 mg at bedtime last night but did not take other PRN medications.  Staff document the patient has been pleasant but has been overheard talking loudly in his room after bedtime medication.  Staff document that patient was suspicious about bedtime medication but eventually took them.  Principal Problem: Schizoaffective disorder ([place]) Diagnosis: Principal Problem:   Schizoaffective disorder ([place])  Total Time spent with patient:  25 minutes  Past Psychiatric History: See admission H&P  Past Medical History:  Past Medical History:  Diagnosis Date   Elevated CPK    per patient   Schizophrenia ([place])    History reviewed. No pertinent surgical history. Family History:  Family History  Problem Relation Age of Onset   Mental illness Brother    Family Psychiatric  History: See admission H&P Social History:  Social History   Substance and Sexual Activity  Alcohol Use Never     Social History   Substance and Sexual Activity  Drug Use Never    Social History   Socioeconomic History   Marital status: Single    Spouse name: Not on file   Number of children: Not on file   Years of education: Not on file   Highest education level: Not on file  Occupational History   Not on file  Tobacco Use   Smoking status: Never   Smokeless tobacco: Never  Substance and Sexual Activity   Alcohol use: Never   Drug use: Never   Sexual activity: Not  on file  Other Topics Concern   Not on file  Social History Narrative   Not on file   Social Determinants of Health   Financial Resource Strain: Not on file  Food Insecurity: Not on file  Transportation Needs: Not on file  Physical Activity: Not on file  Stress: Not on file  Social Connections: Not on file   Additional Social History:                         Sleep: Fair  Appetite:   Good  Current Medications: Current Facility-Administered Medications  Medication Dose Route Frequency Provider Last Rate Last Admin   acetaminophen (TYLENOL) tablet 650 mg  650 mg Oral Q6H PRN Rankin, Shuvon B, NP   650 mg at 12/14/20 0203   alum & mag hydroxide-simeth (MAALOX/MYLANTA) 200-200-20 MG/5ML suspension 30 mL  30 mL Oral Q4H PRN Rankin, Shuvon B, NP       benztropine (COGENTIN) tablet 0.5 mg  0.5 mg Oral Q6H PRN Arthor Captain, MD       benztropine (COGENTIN) tablet 0.5 mg  0.5 mg Oral BID Arthor Captain, MD       diphenhydrAMINE (BENADRYL) injection 50 mg  50 mg Intramuscular Q6H PRN Arthor Captain, MD       gabapentin (NEURONTIN) capsule 300 mg  300 mg Oral QHS Arthor Captain, MD   300 mg at 12/25/20 2126   haloperidol (HALDOL) tablet 5 mg  5 mg Oral BID Arthor Captain, MD       hydrOXYzine (ATARAX/VISTARIL) tablet 25 mg  25 mg Oral Q6H PRN Arthor Captain, MD   25 mg at 12/25/20 2126   LORazepam (ATIVAN) injection 2 mg  2 mg Intramuscular BID PRN Arthor Captain, MD       LORazepam (ATIVAN) tablet 1 mg  1 mg Oral Q6H PRN Arthor Captain, MD   1 mg at 12/23/20 2047   magnesium hydroxide (MILK OF MAGNESIA) suspension 30 mL  30 mL Oral Daily PRN Rankin, Shuvon B, NP       metFORMIN (GLUCOPHAGE) tablet 500 mg  500 mg Oral BID WC Rankin, Shuvon B, NP   500 mg at 12/26/20 4431   Oxcarbazepine (TRILEPTAL) tablet 300 mg  300 mg Oral BID Rankin, Shuvon B, NP   300 mg at 12/26/20 0929   paliperidone (INVEGA SUSTENNA) injection 156 mg  156 mg Intramuscular Q28 days Arthor Captain, MD   156 mg at 12/24/20 1001   polyethylene glycol (MIRALAX / GLYCOLAX) packet 17 g  17 g Oral Daily Arthor Captain, MD   17 g at 12/26/20 0930   propranolol (INDERAL) tablet 40 mg  40 mg Oral Q8H Arthor Captain, MD   40 mg at 12/26/20 5400   traZODone (DESYREL) tablet 50 mg  50 mg Oral QHS PRN,MR X 1 Arthor Captain, MD        Lab Results:  Results for orders placed or performed during the hospital  encounter of 12/03/20 (from the past 48 hour(s))  Resp Panel by RT-PCR (Flu A&B, Covid) Nasopharyngeal Swab     Status: None   Collection Time: 12/25/20  4:31 AM   Specimen: Nasopharyngeal Swab; Nasopharyngeal(NP) swabs in vial transport medium  Result Value Ref Range   SARS Coronavirus 2 by RT PCR NEGATIVE NEGATIVE    Comment: (NOTE) SARS-CoV-2 target nucleic acids are NOT DETECTED.  The SARS-CoV-2 RNA is generally detectable  in upper respiratory specimens during the acute phase of infection. The lowest concentration of SARS-CoV-2 viral copies this assay can detect is 138 copies/mL. A negative result does not preclude SARS-Cov-2 infection and should not be used as the sole basis for treatment or other patient management decisions. A negative result may occur with  improper specimen collection/handling, submission of specimen other than nasopharyngeal swab, presence of viral mutation(s) within the areas targeted by this assay, and inadequate number of viral copies(<138 copies/mL). A negative result must be combined with clinical observations, patient history, and epidemiological information. The expected result is Negative.  Fact Sheet for Patients:  EntrepreneurPulse.com.au  Fact Sheet for Healthcare Providers:  IncredibleEmployment.be  This test is no t yet approved or cleared by the [place] FDA and  has been authorized for detection and/or diagnosis of SARS-CoV-2 by FDA under an Emergency Use Authorization (EUA). This EUA will remain  in effect (meaning this test can be used) for the duration of the COVID-19 declaration under Section 564(b)(1) of the Act, 21 U.S.C.section 360bbb-3(b)(1), unless the authorization is terminated  or revoked sooner.       Influenza A by PCR NEGATIVE NEGATIVE   Influenza B by PCR NEGATIVE NEGATIVE    Comment: (NOTE) The Xpert Xpress SARS-CoV-2/FLU/RSV plus assay is intended as an aid in the diagnosis of  influenza from Nasopharyngeal swab specimens and should not be used as a sole basis for treatment. Nasal washings and aspirates are unacceptable for Xpert Xpress SARS-CoV-2/FLU/RSV testing.  Fact Sheet for Patients: EntrepreneurPulse.com.au  Fact Sheet for Healthcare Providers: IncredibleEmployment.be  This test is not yet approved or cleared by the [place] FDA and has been authorized for detection and/or diagnosis of SARS-CoV-2 by FDA under an Emergency Use Authorization (EUA). This EUA will remain in effect (meaning this test can be used) for the duration of the COVID-19 declaration under Section 564(b)(1) of the Act, 21 U.S.C. section 360bbb-3(b)(1), unless the authorization is terminated or revoked.  Performed at [facility], [place] [address] [place], [place] [address]       Blood Alcohol level:  Lab Results  Component Value Date   [facility] <10 11/29/2020   ETH <10 83/41/9622    Metabolic Disorder Labs: Lab Results  Component Value Date   HGBA1C 5.5 12/04/2020   MPG 111.15 12/04/2020   No results found for: PROLACTIN Lab Results  Component Value Date   CHOL 123 12/02/2020   TRIG 74 12/02/2020   HDL 35 (L) 12/02/2020   CHOLHDL 3.5 12/02/2020   VLDL 15 12/02/2020   LDLCALC 73 12/02/2020   LDLCALC 101 (H) 06/03/2019    Physical Findings: AIMS: Facial and Oral Movements Muscles of Facial Expression: None, normal Lips and Perioral Area: None, normal Jaw: None, normal Tongue: None, normal,Extremity Movements Upper (arms, wrists, hands, fingers): None, normal Lower (legs, knees, ankles, toes): None, normal, Trunk Movements Neck, shoulders, hips: None, normal, Overall Severity Severity of abnormal movements (highest score from questions above): None, normal Incapacitation due to abnormal movements: None, normal Patient's awareness of abnormal movements (rate only patient's report): No Awareness, Dental  Status Current problems with teeth and/or dentures?: No Does patient usually wear dentures?: No  CIWA:    COWS:     Musculoskeletal: Strength & Muscle Tone: within normal limits Gait & Station: normal Patient leans: N/A  Psychiatric Specialty Exam:  Presentation  General Appearance: Fairly Groomed  Eye Contact:Good  Speech:Normal Rate; Other (comment) (Poor articulation and difficult to understand at times)  Speech Volume:Normal  Handedness:Right   Mood and Affect  Mood:Euthymic  Affect:Constricted   Thought Process  Thought Processes:Disorganized  Descriptions of Associations:Loose  Orientation:Full (Time, Place and Person)  Thought Content:Delusions; Paranoid Ideation; Tangential; Illogical  History of Schizophrenia/Schizoaffective disorder:Yes  Duration of Psychotic Symptoms:Greater than six months  Hallucinations:Hallucinations: Auditory Description of Auditory Hallucinations: Declines to describe  Ideas of Reference:Delusions  Suicidal Thoughts:Suicidal Thoughts: No  Homicidal Thoughts:Homicidal Thoughts: No   Sensorium  Memory:Immediate Fair; Recent Fair  Judgment:Impaired  Insight:Shallow   Executive Functions  Concentration:Fair  Attention Span:Fair  [place]   Psychomotor Activity  Psychomotor Activity:Psychomotor Activity: Normal   Assets  Assets:Communication Skills; Desire for Improvement; Social Support   Sleep  Sleep:Sleep: Fair Number of Hours of Sleep: 3    Physical Exam: Physical Exam Vitals and nursing note reviewed.  Constitutional:      General: He is not in acute distress.    Appearance: He is not diaphoretic.  HENT:     Head: Normocephalic and atraumatic.  Pulmonary:     Effort: Pulmonary effort is normal.  Neurological:     General: No focal deficit present.     Mental Status: He is alert and oriented to person, place, and time.   Review of Systems   Constitutional:  Negative for chills, diaphoresis and fever.  HENT:  Negative for sore throat.   Respiratory:  Negative for cough and shortness of breath.   Cardiovascular:  Negative for chest pain and palpitations.  Gastrointestinal:  Negative for constipation, diarrhea, nausea and vomiting.  Musculoskeletal:  Positive for back pain. Negative for myalgias.       Positive for chronic low back pain  Neurological:  Negative for dizziness, seizures and headaches.  Psychiatric/Behavioral:  Positive for hallucinations. Negative for depression, substance abuse and suicidal ideas. The patient is nervous/anxious and has insomnia.   Blood pressure 118/71, pulse 97, temperature 97.7 F (36.5 C), temperature source Oral, resp. rate 16, height 5' 6"  (1.676 m), weight 134.3 kg, SpO2 100 %. Body mass index is 47.78 kg/m.   Treatment Plan Summary: Patient is a 25 year old male with schizoaffective disorder, bipolar type admitted for treatment and stabilization of worsening psychotic and mood symptoms, suicidal ideation and aggressive behavior.  Patient is displaying some improvement in organization of thought processes and is less exclusively focused on delusional themes.  Patient has tolerated the first 2 loading doses of Invega Sustenna LAI.  Patient insisted on receiving first two [place] LAI loading doses via gluteal rather than deltoid injection.  Patient continues on oral Risperdal but appears more disorganized today.   Patient has had prior trials of monotherapy with haloperidol, aripiprazole, Prolixin, Risperdal, Latuda with inadequate response to monotherapy other than Haldol.  Patient has declined to take Haldol and either oral or long-acting injectable form.   Plan will be to continue [place] LAI and oral Risperdal for now.  Patient may need to remain on oral antipsychotic agent in addition to [place] long-term to adequately address psychotic symptoms.  Daily contact with  patient to assess and evaluate symptoms and progress in treatment and Medication management  Schizoaffective disorder -Decrease Risperdal M tabs to 2 mg PO QAM and 2 mg PO QHS for psychotic symptoms for now.  Goal of dose decrease is to improve dysarthria. -Continue Invega Sustenna LAI q. 28 days.  First loading dose of 234 mg IM administered 12/20/2020.  Second loading dose of 156 mg IM was administered on  12/24/2020.  Plan is for maintenance dosing of Invega Sustenna LAI 234 mg IM every 28 days with next dose due approximately 01/21/2021. -Continue Trileptal 300 mg twice daily for mood stabilization.  We will check CMP with a.m. draw tomorrow to monitor for possible hyponatremia and check liver enzymes.  Anxiety/EPS -Continue propranolol 40 mg Q8H for restlessness and anxiety -Continue benztropine 0.5 mg PO Q6H PRN for tremors, muscle stiffness -Continue diphenhydramine 50 mg IM every 6 hours as needed for acute dystonic reaction  Diabetes Mellitus -Continue metformin 500 mg twice daily with meals  Agitation  -Continue lorazepam 1 mg PO Q6H PRN anxiety, restlessness, agitation -Continue lorazepam 2 mg IM BID PRN for severe agitation only if patient unable to take oral PRN medications for agitation  Anxiety -Continue hydroxyzine 25 mg Q6H PRN  Insomnia -Continue gabapentin 300 mg at bedtime for RLS, sleep -Continue doxepin 10 mg at bedtime as needed for sleep.  Constipation -Continue MiraLAX 17 g daily -Continue MOM 30 mL daily PRN -Encourage intake of p.o. fluids  Discharge planning in progress.  Social work has encouraged family to petition for guardian to be appointed for patient.  Housing plan needs to be clarified.  Arthor Captain, MD 12/26/2020, 4:24 PM

## 2020-12-27 ENCOUNTER — Encounter (HOSPITAL_COMMUNITY): Payer: Self-pay

## 2020-12-27 DIAGNOSIS — F250 Schizoaffective disorder, bipolar type: Secondary | ICD-10-CM | POA: Diagnosis not present

## 2020-12-27 LAB — RESP PANEL BY RT-PCR (FLU A&B, COVID) ARPGX2
Influenza A by PCR: NEGATIVE
Influenza B by PCR: NEGATIVE
SARS Coronavirus 2 by RT PCR: POSITIVE — AB

## 2020-12-27 LAB — T4, FREE: Free T4: 0.77 ng/dL (ref 0.61–1.12)

## 2020-12-27 MED ORDER — OXCARBAZEPINE 150 MG PO TABS
150.0000 mg | ORAL_TABLET | Freq: Two times a day (BID) | ORAL | Status: DC
  Administered 2020-12-27 – 2020-12-31 (×10): 150 mg via ORAL
  Filled 2020-12-27 (×14): qty 1

## 2020-12-27 MED ORDER — RISPERIDONE 2 MG PO TBDP
2.0000 mg | ORAL_TABLET | Freq: Every day | ORAL | Status: DC
  Administered 2020-12-28 – 2021-01-05 (×9): 2 mg via ORAL
  Filled 2020-12-27 (×10): qty 1

## 2020-12-27 MED ORDER — RISPERIDONE 3 MG PO TBDP
3.0000 mg | ORAL_TABLET | Freq: Every day | ORAL | Status: DC
  Administered 2020-12-27 – 2021-01-04 (×9): 3 mg via ORAL
  Filled 2020-12-27 (×10): qty 1

## 2020-12-27 NOTE — Progress Notes (Signed)
   12/27/20 0100  Psych Admission Type (Psych Patients Only)  Admission Status Involuntary  Psychosocial Assessment  Patient Complaints None  Eye Contact Fair  Facial Expression Animated  Affect Preoccupied  Speech Soft  Interaction Childlike;Intrusive  Motor Activity Slow  Appearance/Hygiene Improved  Behavior Characteristics Cooperative  Mood Preoccupied  Thought Process  Coherency Disorganized  Content Preoccupation  Delusions Controlled  Perception Hallucinations  Hallucination Auditory  Judgment Poor  Confusion Mild  Danger to Self  Current suicidal ideation? Denies  Danger to Others  Danger to Others None reported or observed

## 2020-12-27 NOTE — Progress Notes (Signed)
Pt was transferred to [address] hall this morning after his Covid-19 test came back positive.  Pt has been tired today.   Pt observed smiling appropriately when talking with nurse at medicine station.  Pt denied SI/HI/AVH but appears to be responding to internal stimuli.  Pt initially declined to take morning medications, but with staff encouragement, pt took medicine.  RN assessed for needs and concerns and provided support.  Pt is currently resting and appears to be in no acute distress.  RN will continue to monitor and intervene as indicated.    12/27/20 1000  Psych Admission Type (Psych Patients Only)  Admission Status Involuntary  Psychosocial Assessment  Patient Complaints Anxiety  Eye Contact Fair  Facial Expression Animated  Affect Anxious;Preoccupied  Speech Soft  Interaction Childlike;Forwards little;Intrusive  Motor Activity Slow  Appearance/Hygiene In scrubs  Behavior Characteristics Cooperative;Calm  Mood Preoccupied  Thought Process  Coherency Disorganized  Content Preoccupation  Delusions Controlled  Perception Hallucinations  Hallucination Auditory  Judgment Limited  Confusion Moderate (possibly increased due to frequent room changes)  Danger to Self  Current suicidal ideation? Denies  Danger to Others  Danger to Others None reported or observed

## 2020-12-27 NOTE — [facility] Group Notes (Signed)
[facility] Group Notes:  (Nursing/MHT/Case Management/Adjunct)  Date:  12/27/2020  Time:  10:17 AM  Type of Therapy:   1:1 Orientation/Goals Group  Participation Level:  Active  Participation Quality:  Appropriate  Affect:  Flat  Cognitive:  Appropriate and Confused  Insight:  Improving and Lacking  Engagement in Group:  Engaged  Modes of Intervention:  Orientation  Summary of Progress/Problems: Pt goal for today is to be able to drink plenty of milk, exercise and get cured.   Noah Ramsey J Pascha Fogal 12/27/2020, 10:17 AM

## 2020-12-27 NOTE — Progress Notes (Addendum)
   12/27/20 2120  Psych Admission Type (Psych Patients Only)  Admission Status Involuntary  Psychosocial Assessment  Patient Complaints None  Eye Contact Fair  Facial Expression Flat  Affect Appropriate to circumstance  Speech Soft  Interaction Forwards little;Minimal  Motor Activity Slow  Appearance/Hygiene Disheveled;Poor hygiene  Behavior Characteristics Cooperative  Mood Sad  Thought Process  Coherency Disorganized  Content WDL  Delusions None reported or observed  Perception Hallucinations  Hallucination None reported or observed  Judgment Limited  Confusion UTA  Danger to Self  Current suicidal ideation? Denies  Danger to Others  Danger to Others None reported or observed   Pt denies SI, HI, AVH and pain. Denies anxiety and depression. Pt took medications as directed. Mouth checked and all pills swallowed.

## 2020-12-27 NOTE — BH IP Treatment Plan (Signed)
Interdisciplinary Treatment and Diagnostic Plan Update  12/27/2020 Time of Session: 9:35am  Noah Ramsey MRN: [id number]  Principal Diagnosis: Schizoaffective disorder [facility])  Secondary Diagnoses: Principal Problem:   Schizoaffective disorder (HCC)   Current Medications:  Current Facility-Administered Medications  Medication Dose Route Frequency Provider Last Rate Last Admin   acetaminophen (TYLENOL) tablet 650 mg  650 mg Oral Q6H PRN Rankin, Shuvon B, NP   650 mg at 12/14/20 0203   alum & mag hydroxide-simeth (MAALOX/MYLANTA) 200-200-20 MG/5ML suspension 30 mL  30 mL Oral Q4H PRN Rankin, Shuvon B, NP       benztropine (COGENTIN) tablet 0.5 mg  0.5 mg Oral Q6H PRN Claudie Revering, MD       diphenhydrAMINE (BENADRYL) injection 50 mg  50 mg Intramuscular Q6H PRN Claudie Revering, MD       doxepin (SINEQUAN) capsule 10 mg  10 mg Oral QHS PRN Claudie Revering, MD       gabapentin (NEURONTIN) capsule 300 mg  300 mg Oral QHS Claudie Revering, MD   300 mg at 12/26/20 2117   hydrOXYzine (ATARAX/VISTARIL) tablet 25 mg  25 mg Oral Q6H PRN Claudie Revering, MD   25 mg at 12/25/20 2126   LORazepam (ATIVAN) injection 2 mg  2 mg Intramuscular BID PRN Claudie Revering, MD       LORazepam (ATIVAN) tablet 1 mg  1 mg Oral Q6H PRN Claudie Revering, MD   1 mg at 12/27/20 0951   magnesium hydroxide (MILK OF MAGNESIA) suspension 30 mL  30 mL Oral Daily PRN Rankin, Shuvon B, NP       metFORMIN (GLUCOPHAGE) tablet 500 mg  500 mg Oral BID WC Rankin, Shuvon B, NP   500 mg at 12/27/20 0840   OXcarbazepine (TRILEPTAL) tablet 150 mg  150 mg Oral BID Claudie Revering, MD   150 mg at 12/27/20 0840   [START ON 01/21/2021] paliperidone (INVEGA SUSTENNA) injection 234 mg  234 mg Intramuscular Q28 days Claudie Revering, MD       polyethylene glycol (MIRALAX / GLYCOLAX) packet 17 g  17 g Oral Daily Claudie Revering, MD   17 g at 12/27/20 0839   propranolol (INDERAL) tablet 40 mg  40 mg Oral Q8H Claudie Revering, MD   40 mg at  12/27/20 4665   [START ON 12/28/2020] risperiDONE (RISPERDAL M-TABS) disintegrating tablet 2 mg  2 mg Oral Daily Claudie Revering, MD       [START ON 12/28/2020] risperiDONE (RISPERDAL M-TABS) disintegrating tablet 3 mg  3 mg Oral QHS Claudie Revering, MD       PTA Medications: Medications Prior to Admission  Medication Sig Dispense Refill Last Dose   diazepam (VALIUM) 5 MG tablet Take 5 mg by mouth 2 (two) times daily as needed for anxiety.      haloperidol (HALDOL) 10 MG tablet Take 10 mg by mouth at bedtime. (Patient not taking: No sig reported)      haloperidol decanoate (HALDOL DECANOATE) 100 MG/ML injection Inject 100 mg into the muscle every 28 (twenty-eight) days. (Patient not taking: No sig reported)      LATUDA 40 MG TABS tablet Take 40 mg by mouth at bedtime.      metFORMIN (GLUCOPHAGE) 500 MG tablet Take 1 tablet (500 mg total) by mouth 2 (two) times daily with a meal. (Patient not taking: No sig reported) 180 tablet 3    OVER THE COUNTER MEDICATION Ripped Fat Burner (diet  pill) (Patient not taking: Reported on 12/06/2020)   Not Taking   OVER THE COUNTER MEDICATION Yohimbine (diet pill) (Patient not taking: Reported on 12/06/2020)   Not Taking   propranolol (INDERAL) 40 MG tablet Take 40 mg by mouth 3 (three) times daily.       Patient Stressors: Financial difficulties Medication change or noncompliance  Patient Strengths: Manufacturing systems engineer Supportive family/friends  Treatment Modalities: Medication Management, Group therapy, Case management,  1 to 1 session with clinician, Psychoeducation, Recreational therapy.   Physician Treatment Plan for Primary Diagnosis: Schizoaffective disorder (HCC) Long Term Goal(s): Improvement in symptoms so as ready for discharge   Short Term Goals: Ability to identify changes in lifestyle to reduce recurrence of condition will improve Ability to verbalize feelings will improve Ability to disclose and discuss suicidal ideas Ability to demonstrate  self-control will improve Ability to identify and develop effective coping behaviors will improve Ability to maintain clinical measurements within normal limits will improve Compliance with prescribed medications will improve  Medication Management: Evaluate patient's response, side effects, and tolerance of medication regimen.  Therapeutic Interventions: 1 to 1 sessions, Unit Group sessions and Medication administration.  Evaluation of Outcomes: Progressing  Physician Treatment Plan for Secondary Diagnosis: Principal Problem:   Schizoaffective disorder (HCC)  Long Term Goal(s): Improvement in symptoms so as ready for discharge   Short Term Goals: Ability to identify changes in lifestyle to reduce recurrence of condition will improve Ability to verbalize feelings will improve Ability to disclose and discuss suicidal ideas Ability to demonstrate self-control will improve Ability to identify and develop effective coping behaviors will improve Ability to maintain clinical measurements within normal limits will improve Compliance with prescribed medications will improve     Medication Management: Evaluate patient's response, side effects, and tolerance of medication regimen.  Therapeutic Interventions: 1 to 1 sessions, Unit Group sessions and Medication administration.  Evaluation of Outcomes: Progressing   RN Treatment Plan for Primary Diagnosis: Schizoaffective disorder (HCC) Long Term Goal(s): Knowledge of disease and therapeutic regimen to maintain health will improve  Short Term Goals: Ability to remain free from injury will improve, Ability to participate in decision making will improve, Ability to verbalize feelings will improve, Ability to disclose and discuss suicidal ideas, and Ability to identify and develop effective coping behaviors will improve  Medication Management: RN will administer medications as ordered by provider, will assess and evaluate patient's response and  provide education to patient for prescribed medication. RN will report any adverse and/or side effects to prescribing provider.  Therapeutic Interventions: 1 on 1 counseling sessions, Psychoeducation, Medication administration, Evaluate responses to treatment, Monitor vital signs and CBGs as ordered, Perform/monitor CIWA, COWS, AIMS and Fall Risk screenings as ordered, Perform wound care treatments as ordered.  Evaluation of Outcomes: Progressing   LCSW Treatment Plan for Primary Diagnosis: Schizoaffective disorder (HCC) Long Term Goal(s): Safe transition to appropriate next level of care at discharge, Engage patient in therapeutic group addressing interpersonal concerns.  Short Term Goals: Engage patient in aftercare planning with referrals and resources, Increase social support, Increase emotional regulation, Facilitate acceptance of mental health diagnosis and concerns, Identify triggers associated with mental health/substance abuse issues, and Increase skills for wellness and recovery  Therapeutic Interventions: Assess for all discharge needs, 1 to 1 time with Social worker, Explore available resources and support systems, Assess for adequacy in community support network, Educate family and significant other(s) on suicide prevention, Complete Psychosocial Assessment, Interpersonal group therapy.  Evaluation of Outcomes: Progressing   Progress in  Treatment: Attending groups: Yes. Participating in groups: Yes. Taking medication as prescribed: Yes. Toleration medication: Yes. Family/Significant other contact made: Yes, individual(s) contacted:  Mother  Patient understands diagnosis: Yes. and No. Discussing patient identified problems/goals with staff: Yes. Medical problems stabilized or resolved: Yes. Denies suicidal/homicidal ideation: Yes. Issues/concerns per patient self-inventory: No.   New problem(s) identified: No, Describe:  None   New Short Term/Long Term Goal(s): medication  stabilization, elimination of SI thoughts, development of comprehensive mental wellness plan.   Patient Goals: Did not attend   Discharge Plan or Barriers: Pt will return to live with his mother and will attend [facility] for therapy and [facility] for medication management.  The Pt has also been referred to an ACTT team, CST Team, and PSR Team at 3 other [facility] mental health facilities that will allow him a wide range of community services.   Reason for Continuation of Hospitalization: Delusions  Medical Issues Medication stabilization  Estimated Length of Stay: 3 to 5 days    Scribe for Treatment Team: Catha Brow 12/27/2020 2:53 PM

## 2020-12-27 NOTE — Progress Notes (Signed)
[facility] MD Progress Note  12/27/2020 2:39 PM Noah Ramsey  MRN:  [id number]  Reason for admission: Patient is a 25 year old male with history of schizoaffective disorder admitted for insomnia x 5 days, aggressive behavior, delusions, suicidal ideation and auditory hallucinations.  Objective: Medical record reviewed.  Patient's case discussed in detail with members of the treatment team.  I met with and evaluated the patient on the unit today for follow-up today.  Patient looks similar to yesterday.  He is disorganized and intermittently loose and tangential.  Patient makes delusional statements about demons, "the [place]," and expresses paranoid concerns that he has "burned bridges" with team social worker, MD and staff.  I attempted to reassure him that he had not burned bridges.  Patient makes statements about antipsychotics being "poison" and that they "cause COVID" and states he is hesitant to take them.  He makes occasional hypersexual statements but is easily redirected.  Patient denies SI, HI or VH.  Patient reports nasal congestion.  He denies other physical problems.  He denies medication side effects.  Patient states that he is eating and sleeping well.  Staff document that patient slept 4 hours last night.  Vital signs this morning include BP 116/65 sitting and 118/58 standing; pulse 96 sitting and 98 standing; O2 sat 100% on room air and temperature of 98.3.  Labs drawn last evening include CMP with low sodium of 133, glucose of 114 and otherwise WNL.  TSH was 2.679.  Free T4 was 0.77.  Repeat influenza A, influenza B and coronavirus testing were performed on the entire 400 unit this morning.  Patient's repeat coronavirus testing this morning was positive.  He was transferred to the 500 unit and is on airborne and contact precautions.  Patient is taking scheduled medications as prescribed but required significant encouragement by staff to take morning oral risperidone today.  He received PRN  lorazepam 1 mg x 1 at 9:50 AM today for anxiety and agitation.  Patient has received no other PRN's.  Staff document the patient deliberately flooded the floor of his room yesterday because he wanted to "have a Jacuzzi bath."  Staff have observed patient actively talking to himself and laughing in response to internal stimuli.  Principal Problem: Schizoaffective disorder ([place]) Diagnosis: Principal Problem:   Schizoaffective disorder ([place])  Total Time spent with patient:  20 minutes  Past Psychiatric History: See admission H&P  Past Medical History:  Past Medical History:  Diagnosis Date   Elevated CPK    per patient   Schizophrenia ([place])    History reviewed. No pertinent surgical history. Family History:  Family History  Problem Relation Age of Onset   Mental illness Brother    Family Psychiatric  History: See admission H&P Social History:  Social History   Substance and Sexual Activity  Alcohol Use Never     Social History   Substance and Sexual Activity  Drug Use Never    Social History   Socioeconomic History   Marital status: Single    Spouse name: Not on file   Number of children: Not on file   Years of education: Not on file   Highest education level: Not on file  Occupational History   Not on file  Tobacco Use   Smoking status: Never   Smokeless tobacco: Never  Substance and Sexual Activity   Alcohol use: Never   Drug use: Never   Sexual activity: Not on file  Other Topics Concern   Not on file  Social  History Narrative   Not on file   Social Determinants of Health   Financial Resource Strain: Not on file  Food Insecurity: Not on file  Transportation Needs: Not on file  Physical Activity: Not on file  Stress: Not on file  Social Connections: Not on file   Additional Social History:                         Sleep: Fair  Appetite:  Good  Current Medications: Current Facility-Administered Medications  Medication Dose Route  Frequency Provider Last Rate Last Admin   acetaminophen (TYLENOL) tablet 650 mg  650 mg Oral Q6H PRN Rankin, Shuvon B, NP   650 mg at 12/14/20 0203   alum & mag hydroxide-simeth (MAALOX/MYLANTA) 200-200-20 MG/5ML suspension 30 mL  30 mL Oral Q4H PRN Rankin, Shuvon B, NP       benztropine (COGENTIN) tablet 0.5 mg  0.5 mg Oral Q6H PRN Arthor Captain, MD       diphenhydrAMINE (BENADRYL) injection 50 mg  50 mg Intramuscular Q6H PRN Arthor Captain, MD       doxepin (SINEQUAN) capsule 10 mg  10 mg Oral QHS PRN Arthor Captain, MD       gabapentin (NEURONTIN) capsule 300 mg  300 mg Oral QHS Arthor Captain, MD   300 mg at 12/26/20 2117   hydrOXYzine (ATARAX/VISTARIL) tablet 25 mg  25 mg Oral Q6H PRN Arthor Captain, MD   25 mg at 12/25/20 2126   LORazepam (ATIVAN) injection 2 mg  2 mg Intramuscular BID PRN Arthor Captain, MD       LORazepam (ATIVAN) tablet 1 mg  1 mg Oral Q6H PRN Arthor Captain, MD   1 mg at 12/27/20 0951   magnesium hydroxide (MILK OF MAGNESIA) suspension 30 mL  30 mL Oral Daily PRN Rankin, Shuvon B, NP       metFORMIN (GLUCOPHAGE) tablet 500 mg  500 mg Oral BID WC Rankin, Shuvon B, NP   500 mg at 12/27/20 0840   OXcarbazepine (TRILEPTAL) tablet 150 mg  150 mg Oral BID Arthor Captain, MD   150 mg at 12/27/20 0840   [START ON 01/21/2021] paliperidone (INVEGA SUSTENNA) injection 234 mg  234 mg Intramuscular Q28 days Arthor Captain, MD       polyethylene glycol (MIRALAX / GLYCOLAX) packet 17 g  17 g Oral Daily Arthor Captain, MD   17 g at 12/27/20 0839   propranolol (INDERAL) tablet 40 mg  40 mg Oral Q8H Arthor Captain, MD   40 mg at 12/27/20 9622   risperiDONE (RISPERDAL M-TABS) disintegrating tablet 2 mg  2 mg Oral BID Arthor Captain, MD   2 mg at 12/27/20 2979    Lab Results:  Results for orders placed or performed during the hospital encounter of 12/03/20 (from the past 48 hour(s))  Comprehensive metabolic panel     Status: Abnormal   Collection Time: 12/26/20  6:34 PM   Result Value Ref Range   Sodium 133 (L) 135 - 145 mmol/L   Potassium 3.8 3.5 - 5.1 mmol/L   Chloride 99 98 - 111 mmol/L   CO2 22 22 - 32 mmol/L   Glucose, Bld 114 (H) 70 - 99 mg/dL    Comment: Glucose reference range applies only to samples taken after fasting for at least 8 hours.   BUN 13 6 - 20 mg/dL   Creatinine, Ser 0.93 0.61 -  1.24 mg/dL   Calcium 8.9 8.9 - 10.3 mg/dL   Total Protein 7.5 6.5 - 8.1 g/dL   Albumin 3.8 3.5 - 5.0 g/dL   AST 40 15 - 41 U/L   ALT 32 0 - 44 U/L   Alkaline Phosphatase 83 38 - 126 U/L   Total Bilirubin 0.6 0.3 - 1.2 mg/dL   GFR, Estimated >60 >60 mL/min    Comment: (NOTE) Calculated using the CKD-EPI Creatinine Equation (2021)    Anion gap 12 5 - 15    Comment: Performed at [facility], [place] [address] [place], [place] [address]  T4, free     Status: None   Collection Time: 12/26/20  6:34 PM  Result Value Ref Range   Free T4 0.77 0.61 - 1.12 ng/dL    Comment: (NOTE) Biotin ingestion may interfere with free T4 tests. If the results are inconsistent with the TSH level, previous test results, or the clinical presentation, then consider biotin interference. If needed, order repeat testing after stopping biotin. Performed at [facility], [place] [address] [place], [place] [address]   TSH     Status: None   Collection Time: 12/26/20  6:34 PM  Result Value Ref Range   TSH 2.679 0.350 - 4.500 uIU/mL    Comment: Performed by a 3rd Generation assay with a functional sensitivity of <=0.01 uIU/mL. Performed at [facility], [place] [address] [place], [place] [address]   Resp Panel by RT-PCR (Flu A&B, Covid) Nasopharyngeal Swab     Status: Abnormal   Collection Time: 12/27/20  4:54 AM   Specimen: Nasopharyngeal Swab; Nasopharyngeal(NP) swabs in vial transport medium  Result Value Ref Range   SARS Coronavirus 2 by RT PCR POSITIVE (A) NEGATIVE    Comment: RESULT CALLED TO, READ BACK BY AND VERIFIED WITH: Gwenyth Bender. RN ON 12/27/2020 @ 5859 BY MECIAL J. (NOTE) SARS-CoV-2 target nucleic acids are DETECTED.  The SARS-CoV-2 RNA is generally detectable in upper respiratory specimens during the acute phase of infection. Positive results are indicative of the presence of the identified virus, but do not rule out bacterial infection or co-infection with other pathogens not detected by the test. Clinical correlation with patient history and other diagnostic information is necessary to determine patient infection status. The expected result is Negative.  Fact Sheet for Patients: EntrepreneurPulse.com.au  Fact Sheet for Healthcare Providers: IncredibleEmployment.be  This test is not yet approved or cleared by the [place] FDA and  has been authorized for detection and/or diagnosis of SARS-CoV-2 by FDA under an Emergency Use Authorization (EUA).  This EUA will remain in effect (meaning thi s test can be used) for the duration of  the COVID-19 declaration under Section 564(b)(1) of the Act, 21 U.S.C. section 360bbb-3(b)(1), unless the authorization is terminated or revoked sooner.     Influenza A by PCR NEGATIVE NEGATIVE   Influenza B by PCR NEGATIVE NEGATIVE    Comment: (NOTE) The Xpert Xpress SARS-CoV-2/FLU/RSV plus assay is intended as an aid in the diagnosis of influenza from Nasopharyngeal swab specimens and should not be used as a sole basis for treatment. Nasal washings and aspirates are unacceptable for Xpert Xpress SARS-CoV-2/FLU/RSV testing.  Fact Sheet for Patients: EntrepreneurPulse.com.au  Fact Sheet for Healthcare Providers: IncredibleEmployment.be  This test is not yet approved or cleared by the [place] FDA and has been authorized for detection and/or diagnosis of SARS-CoV-2 by FDA under an Emergency Use Authorization (EUA). This EUA will remain  in effect (meaning this test can be used) for the  duration of the COVID-19 declaration under Section 564(b)(1) of the Act, 21 U.S.C. section 360bbb-3(b)(1), unless the authorization is terminated or revoked.  Performed at [facility], [place] [address], [place], [place] [address]       Blood Alcohol level:  Lab Results  Component Value Date   [facility] <10 11/29/2020   ETH <10 42/87/6811    Metabolic Disorder Labs: Lab Results  Component Value Date   HGBA1C 5.5 12/04/2020   MPG 111.15 12/04/2020   No results found for: PROLACTIN Lab Results  Component Value Date   CHOL 123 12/02/2020   TRIG 74 12/02/2020   HDL 35 (L) 12/02/2020   CHOLHDL 3.5 12/02/2020   VLDL 15 12/02/2020   LDLCALC 73 12/02/2020   LDLCALC 101 (H) 06/03/2019    Physical Findings: AIMS: Facial and Oral Movements Muscles of Facial Expression: None, normal Lips and Perioral Area: None, normal Jaw: None, normal Tongue: None, normal,Extremity Movements Upper (arms, wrists, hands, fingers): None, normal Lower (legs, knees, ankles, toes): None, normal, Trunk Movements Neck, shoulders, hips: None, normal, Overall Severity Severity of abnormal movements (highest score from questions above): None, normal Incapacitation due to abnormal movements: None, normal Patient's awareness of abnormal movements (rate only patient's report): No Awareness, Dental Status Current problems with teeth and/or dentures?: No Does patient usually wear dentures?: No  CIWA:    COWS:     Musculoskeletal: Strength & Muscle Tone: within normal limits Gait & Station: normal Patient leans: N/A  Psychiatric Specialty Exam:  Presentation  General Appearance: Fairly Groomed  Eye Contact:Good  Speech:Normal Rate; Other (comment) (Intermittently garbled/poor articulation and difficult to understand at times)  Speech Volume:Normal  Handedness:Right   Mood and Affect  Mood:Anxious  Affect:Constricted   Thought Process  Thought  Processes:Disorganized  Descriptions of Associations:Loose  Orientation:Full (Time, Place and Person)  Thought Content:Delusions; Paranoid Ideation; Tangential  History of Schizophrenia/Schizoaffective disorder:Yes  Duration of Psychotic Symptoms:Greater than six months  Hallucinations:Hallucinations: Auditory Description of Auditory Hallucinations: Declines to describe  Ideas of Reference:Delusions  Suicidal Thoughts:Suicidal Thoughts: No  Homicidal Thoughts:Homicidal Thoughts: No   Sensorium  Memory:Immediate Fair; Recent Fair  Judgment:Impaired  Insight:Shallow   Executive Functions  Concentration:Fair  Attention Span:Fair  [place]   Psychomotor Activity  Psychomotor Activity:Psychomotor Activity: Normal   Assets  Assets:Communication Skills; Desire for Improvement; Social Support; Housing   Sleep  Sleep:Sleep: Fair Number of Hours of Sleep: 4    Physical Exam: Physical Exam Vitals and nursing note reviewed.  Constitutional:      General: He is not in acute distress.    Appearance: He is not diaphoretic.  HENT:     Head: Normocephalic and atraumatic.  Cardiovascular:     Rate and Rhythm: Normal rate.  Pulmonary:     Effort: Pulmonary effort is normal.  Neurological:     General: No focal deficit present.     Mental Status: He is alert and oriented to person, place, and time.   Review of Systems  Constitutional:  Negative for chills, diaphoresis and fever.  HENT:  Negative for sore throat.   Respiratory:  Negative for cough and shortness of breath.   Cardiovascular:  Negative for chest pain and palpitations.  Gastrointestinal:  Negative for constipation, diarrhea, nausea and vomiting.  Musculoskeletal:  Positive for back pain. Negative for myalgias.       Positive for chronic low back pain  Neurological:  Negative for dizziness, tremors, seizures and headaches.  Psychiatric/Behavioral:   Positive for hallucinations. Negative for depression, substance abuse and suicidal ideas. The patient is nervous/anxious and has insomnia.   Blood pressure (!) 118/58, pulse 98, temperature 98.3 F (36.8 C), temperature source Oral, resp. rate 16, height 5' 6"  (1.676 m), weight 134.3 kg, SpO2 100 %. Body mass index is 47.78 kg/m.   Treatment Plan Summary: Patient is a 25 year old male with schizoaffective disorder, bipolar type admitted for treatment and stabilization of worsening psychotic and mood symptoms, suicidal ideation and aggressive behavior.  Patient is displaying some improvement in organization of thought processes and is less exclusively focused on delusional themes.  Patient has tolerated the first 2 loading doses of Invega Sustenna LAI.  Patient insisted on receiving first two [place] LAI loading doses via gluteal rather than deltoid injection.  Patient continues on oral Risperdal but has appeared more disorganized for the past 2 days.   I have requested that staff perform consistent mouth checks after medication administration to ensure that patient is not cheeking his medications.  Plan is to continue [place] LAI and oral Risperdal for now.  Patient may need to remain on oral antipsychotic agent in addition to [place] long-term to adequately address psychotic symptoms.  Daily contact with patient to assess and evaluate symptoms and progress in treatment and Medication management  Schizoaffective disorder -Increase Risperdal M tabs 2 mg PO QAM and 3 mg PO QHS for psychotic symptoms for now.   -Continue Invega Sustenna LAI q. 28 days.  First loading dose of 234 mg IM administered 12/20/2020.  Second loading dose of 156 mg IM was administered on 12/24/2020.  Plan is for maintenance dosing of Invega Sustenna LAI 234 mg IM every 28 days with next dose due approximately 01/21/2021. -Decrease Trileptal to 150 mg twice daily with plan for eventual discontinuation.   Patient does not appear to be obtaining significant mood stabilization benefit from Trileptal.  Goal is to streamline medication regimen and reduce polypharmacy where possible.  Repeat CMP yesterday evening with sodium of 133.     Anxiety/EPS -Continue propranolol 40 mg Q8H for restlessness and anxiety -Continue benztropine 0.5 mg PO Q6H PRN for tremors, muscle stiffness -Continue diphenhydramine 50 mg IM every 6 hours as needed for acute dystonic reaction  Diabetes Mellitus -Continue metformin 500 mg twice daily with meals  Agitation  -Continue lorazepam 1 mg PO Q6H PRN anxiety, restlessness, agitation -Continue lorazepam 2 mg IM BID PRN for severe agitation only if patient unable to take oral PRN medications for agitation  Anxiety -Continue hydroxyzine 25 mg Q6H PRN  Insomnia -Continue gabapentin 300 mg at bedtime for RLS, sleep -Continue doxepin 10 mg at bedtime as needed for sleep.  Constipation -Continue MiraLAX 17 g daily -Continue MOM 30 mL daily PRN -Encourage intake of p.o. fluids  Discharge planning in progress.  Social work has encouraged family to petition for guardian to be appointed for patient.  Housing plan needs to be clarified.  Arthor Captain, MD 12/27/2020, 2:39 PM

## 2020-12-27 NOTE — [facility] Group Notes (Signed)
Type of Therapy:  Group Therapy   Modes of Intervention:  Coping Skills   Due to the COVID-19 pandemic, this group has been supplemented with worksheets.   Summary of Progress/Problems: CSW provided worksheet to patient due to COVID. CSW offered to meet individually with patient as needed  Shamarra Warda, LCSWA Clinicial Social Worker [facility]  

## 2020-12-28 DIAGNOSIS — F250 Schizoaffective disorder, bipolar type: Secondary | ICD-10-CM | POA: Diagnosis not present

## 2020-12-28 MED ORDER — ONDANSETRON HCL 4 MG PO TABS: 4.0000 mg | ORAL_TABLET | Freq: Three times a day (TID) | ORAL | Status: DC | PRN

## 2020-12-28 NOTE — Progress Notes (Signed)
Notified by MHT that pt constantly in bathroom and has been vomiting. Pt states that he is okay and has not been vomiting. Pt fixated on swirling water in toilet. Will continue to redirect pt

## 2020-12-28 NOTE — Progress Notes (Signed)
During 15-minute check, pt asked if he needs medicine for nausea. Provider also in room. "I'm fine. I made myself throw up. The medicine was making me nauseous." Pt told that he could have anti-nausea medication to help with that. Pt declined. "Just some more water." Pt given cup of ice water. Noticed that pt had 6 (8) oz empty water bottles. Pt could not say how long it took him to drink them. Pt denies current nausea or any other issues. Will continue to monitor.

## 2020-12-28 NOTE — Progress Notes (Signed)
Pt was witnessed by Clinical research associate throwing up into the toilet. Nurse was notified. Pt has been in and out of the bathroom throughout the evening constantly flushing the toilet. When asked why they flushed so much, pt stated that "it's a Jacuzzi".

## 2020-12-28 NOTE — Progress Notes (Signed)
Told by MHT that pt had vomited. Patient in bathroom washing hands. Stated, "I got sick." All pt wanted was some ice water to drink. Water given.  Pt also has not been sleeping this evening. Per 15-minute check sheet, pt slept this afternoon from 1345-1715. Pt refused PRN sleep meds. "I'm alright."  Will continue to monitor.

## 2020-12-28 NOTE — [facility] Group Notes (Signed)
[facility] Group Notes:  (Nursing/MHT/Case Management/Adjunct)  Date:  12/28/2020  Time:  11:02 AM  Type of Therapy:   Orientation/Goals Group  Participation Level:  Minimal  Participation Quality:  Appropriate  Affect:  Flat  Cognitive:  Appropriate  Insight:  Improving  Engagement in Group:  Engaged  Modes of Intervention:  Orientation  Summary of Progress/Problems: Pt goal for today is to try to get rest since he did not get much sleep, exercise and find out about discharge.   Noah Ramsey J Palmer Shorey 12/28/2020, 11:02 AM

## 2020-12-28 NOTE — Progress Notes (Signed)
Patient appears anxious and paranoid. He is guarded during assessment. He denies SI, HI, and AVH. Patient initially refuses medications, stating that "they aren't good for me". Patient was encouraged to take medications and this Clinical research associate provided encouragement and education on each medication. Patient stares at medicine cup before eventually taking them. Patient also shows this Clinical research associate that he vomited in his toilet overnight. Patient was offered Zofran, but refused, saying that he feels better now. Patient remains safe on the unit at this time.   12/28/20 0841  Psych Admission Type (Psych Patients Only)  Admission Status Involuntary  Psychosocial Assessment  Patient Complaints Suspiciousness  Eye Contact Fair  Facial Expression Flat  Affect Preoccupied  Speech Soft  Interaction Guarded  Motor Activity Slow  Appearance/Hygiene Disheveled;Poor hygiene  Behavior Characteristics Guarded  Mood Preoccupied  Thought Process  Coherency Disorganized  Content Preoccupation  Delusions None reported or observed  Perception Hallucinations  Hallucination Auditory  Judgment Poor  Confusion WDL  Danger to Self  Current suicidal ideation? Denies  Danger to Others  Danger to Others None reported or observed

## 2020-12-28 NOTE — Progress Notes (Signed)
Pt refused his Inderal this morning. Pt gave no specific reason why when asked. BP 147/95 with pulse of 81. Moved dose to an hour later in case pt changes his mind.

## 2020-12-28 NOTE — Group Note (Signed)
Occupational Therapy Group Note  Group Topic:Stress Management  Group Date: 12/28/2020 Start Time: 1400 End Time: 1445 Facilitators: Ervan Heber, OT/L   Group Description: Group encouraged increased participation and engagement through discussion focused on topic of stress management. Patients engaged interactively to discuss components of stress including physical signs, emotional signs, negative management strategies, and positive management strategies. Each individual identified one new stress management strategy they would like to try moving forward.    Therapeutic Goals: Identify current stressors Identify healthy vs unhealthy stress management strategies/techniques Discuss and identify physical and emotional signs of stress   Participation Level: OT Group cancelled d/t COVID+ status of unit. Will continue to engage patients in OT groups as scheduled and encourage participation/engagement.   Plan: Continue to engage patient in OT groups 2 - 3x/week.  12/28/2020  Cherrie Franca, OT/L    

## 2020-12-28 NOTE — Progress Notes (Signed)
   12/28/20 2130  Psych Admission Type (Psych Patients Only)  Admission Status Involuntary  Psychosocial Assessment  Patient Complaints Suspiciousness  Eye Contact Fair  Facial Expression Flat  Affect Preoccupied  Speech Soft  Interaction Guarded  Motor Activity Slow  Appearance/Hygiene Disheveled;Poor hygiene  Behavior Characteristics Guarded  Mood Suspicious;Preoccupied  Thought Process  Coherency Circumstantial  Content Preoccupation  Delusions None reported or observed  Perception Hallucinations  Hallucination None reported or observed  Judgment Poor  Confusion None  Danger to Self  Current suicidal ideation? Denies  Danger to Others  Danger to Others None reported or observed   Pt denies SI, HI, AVH and pain. Denies anxiety and depression. In report, it was communicated that pt has thrown up today. It was mostly watery with some solids. Pt made to sit in dayroom for 20 minutes after taking medication this evening to prevent him from inducing emesis right after taking medication. Pt still flat affect and suspicious.

## 2020-12-28 NOTE — Progress Notes (Signed)
[facility] MD Progress Note  12/28/2020 6:12 PM Noah Ramsey  MRN:  [id number]  Reason for admission: Patient is a 25 year old male with history of schizoaffective disorder admitted for insomnia x 5 days, aggressive behavior, delusions, suicidal ideation and auditory hallucinations.  Objective: Medical record reviewed.  Patient's case discussed in detail with members of the treatment team.  I met with and evaluated the patient on the unit today for follow-up today.  Patient looks a little better than yesterday.  He is alert and fully oriented.  He is better able to participate in a reciprocal conversation and is more easily redirected to return to topic today.  Patient continues to present with disorganized thought processes.  He continues to make paranoid and grandiose delusional statements about being "the [place]", being a demon, time travel, etc.  He tells me that he believes we may be related.  Patient is intrusive with personal questions and has to be redirected several times during our conversation.  He denies any symptoms consistent with COVID infection or any physical problems other than chronic left knee pain.  Patient continues to maintain that he does not believe he needs psychiatric medications.  I encouraged him to keep taking the medications and advised him I will try to streamline his regimen so he has to take as few tablets per day as possible.  He reports that he is eating well and drinking fluids.  Patient states that he prefers the dark and typically sleeps during the day at home.  We discussed the plan for him to discharge home eventually.  Staff document that patient slept only 1 hour last night.  No new labs today.  Patient has been afebrile with O2 sat of 100% and normal heart rate.  He has been mildly hypertensive today.  He took scheduled medications as prescribed and has not taken any as needed medications in the last 24 hours.  Staff continue to observe patient intermittently  responding to internal stimuli.  He told staff last night that he made himself throw up because the medication made him nauseous.  Patient required much encouragement from staff to take medications this morning.  Principal Problem: Schizoaffective disorder ([place]) Diagnosis: Principal Problem:   Schizoaffective disorder ([place])  Total Time spent with patient:  20 minutes  Past Psychiatric History: See admission H&P  Past Medical History:  Past Medical History:  Diagnosis Date   Elevated CPK    per patient   Schizophrenia ([place])    History reviewed. No pertinent surgical history. Family History:  Family History  Problem Relation Age of Onset   Mental illness Brother    Family Psychiatric  History: See admission H&P Social History:  Social History   Substance and Sexual Activity  Alcohol Use Never     Social History   Substance and Sexual Activity  Drug Use Never    Social History   Socioeconomic History   Marital status: Single    Spouse name: Not on file   Number of children: Not on file   Years of education: Not on file   Highest education level: Not on file  Occupational History   Not on file  Tobacco Use   Smoking status: Never   Smokeless tobacco: Never  Substance and Sexual Activity   Alcohol use: Never   Drug use: Never   Sexual activity: Not on file  Other Topics Concern   Not on file  Social History Narrative   Not on file   Social Determinants of  Health   Financial Resource Strain: Not on file  Food Insecurity: Not on file  Transportation Needs: Not on file  Physical Activity: Not on file  Stress: Not on file  Social Connections: Not on file   Additional Social History:                         Sleep: Poor.  Patient has reversed sleep-wake cycle and sleeps during the day and stays up at night.  Appetite:  Good  Current Medications: Current Facility-Administered Medications  Medication Dose Route Frequency Provider Last Rate Last  Admin   acetaminophen (TYLENOL) tablet 650 mg  650 mg Oral Q6H PRN Rankin, Shuvon B, NP   650 mg at 12/14/20 0203   alum & mag hydroxide-simeth (MAALOX/MYLANTA) 200-200-20 MG/5ML suspension 30 mL  30 mL Oral Q4H PRN Rankin, Shuvon B, NP       benztropine (COGENTIN) tablet 0.5 mg  0.5 mg Oral Q6H PRN Arthor Captain, MD       diphenhydrAMINE (BENADRYL) injection 50 mg  50 mg Intramuscular Q6H PRN Arthor Captain, MD       doxepin (SINEQUAN) capsule 10 mg  10 mg Oral QHS PRN Arthor Captain, MD       gabapentin (NEURONTIN) capsule 300 mg  300 mg Oral QHS Arthor Captain, MD   300 mg at 12/27/20 2119   hydrOXYzine (ATARAX/VISTARIL) tablet 25 mg  25 mg Oral Q6H PRN Arthor Captain, MD   25 mg at 12/25/20 2126   LORazepam (ATIVAN) injection 2 mg  2 mg Intramuscular BID PRN Arthor Captain, MD       LORazepam (ATIVAN) tablet 1 mg  1 mg Oral Q6H PRN Arthor Captain, MD   1 mg at 12/27/20 0951   magnesium hydroxide (MILK OF MAGNESIA) suspension 30 mL  30 mL Oral Daily PRN Rankin, Shuvon B, NP       metFORMIN (GLUCOPHAGE) tablet 500 mg  500 mg Oral BID WC Rankin, Shuvon B, NP   500 mg at 12/28/20 1710   ondansetron (ZOFRAN) tablet 4 mg  4 mg Oral Q8H PRN Arthor Captain, MD       OXcarbazepine (TRILEPTAL) tablet 150 mg  150 mg Oral BID Arthor Captain, MD   150 mg at 12/28/20 1710   [START ON 01/21/2021] paliperidone (INVEGA SUSTENNA) injection 234 mg  234 mg Intramuscular Q28 days Arthor Captain, MD       polyethylene glycol (MIRALAX / GLYCOLAX) packet 17 g  17 g Oral Daily Arthor Captain, MD   17 g at 12/28/20 0748   propranolol (INDERAL) tablet 40 mg  40 mg Oral Q8H Arthor Captain, MD   40 mg at 12/28/20 1337   risperiDONE (RISPERDAL M-TABS) disintegrating tablet 2 mg  2 mg Oral Daily Arthor Captain, MD   2 mg at 12/28/20 0748   risperiDONE (RISPERDAL M-TABS) disintegrating tablet 3 mg  3 mg Oral QHS Arthor Captain, MD   3 mg at 12/27/20 2119    Lab Results:  Results for orders placed or  performed during the hospital encounter of 12/03/20 (from the past 48 hour(s))  Comprehensive metabolic panel     Status: Abnormal   Collection Time: 12/26/20  6:34 PM  Result Value Ref Range   Sodium 133 (L) 135 - 145 mmol/L   Potassium 3.8 3.5 - 5.1 mmol/L   Chloride 99 98 - 111 mmol/L  CO2 22 22 - 32 mmol/L   Glucose, Bld 114 (H) 70 - 99 mg/dL    Comment: Glucose reference range applies only to samples taken after fasting for at least 8 hours.   BUN 13 6 - 20 mg/dL   Creatinine, Ser 0.93 0.61 - 1.24 mg/dL   Calcium 8.9 8.9 - 10.3 mg/dL   Total Protein 7.5 6.5 - 8.1 g/dL   Albumin 3.8 3.5 - 5.0 g/dL   AST 40 15 - 41 U/L   ALT 32 0 - 44 U/L   Alkaline Phosphatase 83 38 - 126 U/L   Total Bilirubin 0.6 0.3 - 1.2 mg/dL   GFR, Estimated >60 >60 mL/min    Comment: (NOTE) Calculated using the CKD-EPI Creatinine Equation (2021)    Anion gap 12 5 - 15    Comment: Performed at [facility], [employer] [address] [place], [place] [address]  T4, free     Status: None   Collection Time: 12/26/20  6:34 PM  Result Value Ref Range   Free T4 0.77 0.61 - 1.12 ng/dL    Comment: (NOTE) Biotin ingestion may interfere with free T4 tests. If the results are inconsistent with the TSH level, previous test results, or the clinical presentation, then consider biotin interference. If needed, order repeat testing after stopping biotin. Performed at [facility], [place] [address] [place], [place] [address]   TSH     Status: None   Collection Time: 12/26/20  6:34 PM  Result Value Ref Range   TSH 2.679 0.350 - 4.500 uIU/mL    Comment: Performed by a 3rd Generation assay with a functional sensitivity of <=0.01 uIU/mL. Performed at [facility], [place] [address], [place], [place] [address]   Resp Panel by RT-PCR (Flu A&B, Covid) Nasopharyngeal Swab     Status: Abnormal   Collection Time: 12/27/20  4:54 AM   Specimen: Nasopharyngeal Swab; Nasopharyngeal(NP) swabs in  vial transport medium  Result Value Ref Range   SARS Coronavirus 2 by RT PCR POSITIVE (A) NEGATIVE    Comment: RESULT CALLED TO, READ BACK BY AND VERIFIED WITH: Gwenyth Bender. RN ON 12/27/2020 @ 8325 BY MECIAL J. (NOTE) SARS-CoV-2 target nucleic acids are DETECTED.  The SARS-CoV-2 RNA is generally detectable in upper respiratory specimens during the acute phase of infection. Positive results are indicative of the presence of the identified virus, but do not rule out bacterial infection or co-infection with other pathogens not detected by the test. Clinical correlation with patient history and other diagnostic information is necessary to determine patient infection status. The expected result is Negative.  Fact Sheet for Patients: EntrepreneurPulse.com.au  Fact Sheet for Healthcare Providers: IncredibleEmployment.be  This test is not yet approved or cleared by the [place] FDA and  has been authorized for detection and/or diagnosis of SARS-CoV-2 by FDA under an Emergency Use Authorization (EUA).  This EUA will remain in effect (meaning thi s test can be used) for the duration of  the COVID-19 declaration under Section 564(b)(1) of the Act, 21 U.S.C. section 360bbb-3(b)(1), unless the authorization is terminated or revoked sooner.     Influenza A by PCR NEGATIVE NEGATIVE   Influenza B by PCR NEGATIVE NEGATIVE    Comment: (NOTE) The Xpert Xpress SARS-CoV-2/FLU/RSV plus assay is intended as an aid in the diagnosis of influenza from Nasopharyngeal swab specimens and should not be used as a sole basis for treatment. Nasal washings and aspirates are unacceptable for Xpert Xpress SARS-CoV-2/FLU/RSV  testing.  Fact Sheet for Patients: EntrepreneurPulse.com.au  Fact Sheet for Healthcare Providers: IncredibleEmployment.be  This test is not yet approved or cleared by the [place] FDA and has been authorized  for detection and/or diagnosis of SARS-CoV-2 by FDA under an Emergency Use Authorization (EUA). This EUA will remain in effect (meaning this test can be used) for the duration of the COVID-19 declaration under Section 564(b)(1) of the Act, 21 U.S.C. section 360bbb-3(b)(1), unless the authorization is terminated or revoked.  Performed at [facility], [place] [address], [place], [place] [address]       Blood Alcohol level:  Lab Results  Component Value Date   [facility] <10 11/29/2020   ETH <10 46/65/9935    Metabolic Disorder Labs: Lab Results  Component Value Date   HGBA1C 5.5 12/04/2020   MPG 111.15 12/04/2020   No results found for: PROLACTIN Lab Results  Component Value Date   CHOL 123 12/02/2020   TRIG 74 12/02/2020   HDL 35 (L) 12/02/2020   CHOLHDL 3.5 12/02/2020   VLDL 15 12/02/2020   LDLCALC 73 12/02/2020   LDLCALC 101 (H) 06/03/2019    Physical Findings: AIMS: Facial and Oral Movements Muscles of Facial Expression: None, normal Lips and Perioral Area: None, normal Jaw: None, normal Tongue: None, normal,Extremity Movements Upper (arms, wrists, hands, fingers): None, normal Lower (legs, knees, ankles, toes): None, normal, Trunk Movements Neck, shoulders, hips: None, normal, Overall Severity Severity of abnormal movements (highest score from questions above): None, normal Incapacitation due to abnormal movements: None, normal Patient's awareness of abnormal movements (rate only patient's report): No Awareness, Dental Status Current problems with teeth and/or dentures?: No Does patient usually wear dentures?: No  CIWA:    COWS:     Musculoskeletal: Strength & Muscle Tone: within normal limits Gait & Station: normal Patient leans: N/A  Psychiatric Specialty Exam:  Presentation  General Appearance: Fairly Groomed  Eye Contact:Good  Speech:Normal Rate  Speech Volume:Normal  Handedness:Right   Mood and Affect   Mood:Anxious  Affect:Congruent   Thought Process  Thought Processes:Disorganized  Descriptions of Associations:Loose  Orientation:Full (Time, Place and Person)  Thought Content:Delusions; Paranoid Ideation  History of Schizophrenia/Schizoaffective disorder:Yes  Duration of Psychotic Symptoms:Greater than six months  Hallucinations:Hallucinations: Auditory Description of Auditory Hallucinations: Declines to describe  Ideas of Reference:Delusions; Paranoia  Suicidal Thoughts:Suicidal Thoughts: No  Homicidal Thoughts:Homicidal Thoughts: No   Sensorium  Memory:Immediate Fair; Recent Fair; Remote Fair  Judgment:Impaired  Insight:Shallow   Executive Functions  Concentration:Fair  Attention Span:Fair  [place]  Language:Good   Psychomotor Activity  Psychomotor Activity:Psychomotor Activity: Normal   Assets  Assets:Communication Skills; Desire for Improvement; Social Support; Housing   Sleep  Sleep:Sleep: Poor Number of Hours of Sleep: 1    Physical Exam: Physical Exam Vitals and nursing note reviewed.  Constitutional:      General: He is not in acute distress.    Appearance: He is not diaphoretic.  HENT:     Head: Normocephalic and atraumatic.  Cardiovascular:     Rate and Rhythm: Normal rate.  Pulmonary:     Effort: Pulmonary effort is normal.  Neurological:     General: No focal deficit present.     Mental Status: He is alert and oriented to person, place, and time.   Review of Systems  Constitutional:  Negative for chills, diaphoresis and fever.  HENT:  Negative for sore throat.   Respiratory:  Negative for cough and shortness of breath.   Cardiovascular:  Negative for chest pain and palpitations.  Gastrointestinal:  Negative for constipation, diarrhea, nausea and vomiting.  Musculoskeletal:  Positive for joint pain. Negative for myalgias.       Positive for chronic left knee pain.  Neurological:  Negative  for dizziness, tremors, seizures and headaches.  Psychiatric/Behavioral:  Positive for hallucinations. Negative for depression, substance abuse and suicidal ideas. The patient is nervous/anxious and has insomnia.        Positive for reversed sleep-wake cycle; patient states he prefers to sleep during the day  All other systems reviewed and are negative. Blood pressure (!) 145/81, pulse 79, temperature 97.8 F (36.6 C), resp. rate 16, height 5' 6"  (1.676 m), weight 134.3 kg, SpO2 100 %. Body mass index is 47.78 kg/m.   Treatment Plan Summary: Patient is a 25 year old male with schizoaffective disorder, bipolar type admitted for treatment and stabilization of worsening psychotic and mood symptoms, suicidal ideation and aggressive behavior.  Patient is displaying some improvement in organization of thought processes and is less exclusively focused on delusional themes.  Patient has tolerated the first 2 loading doses of Invega Sustenna LAI.  Patient insisted on receiving first two [place] LAI loading doses via gluteal rather than deltoid injection.  Patient continues on oral Risperdal but has appeared more disorganized for the past 2 days.   I have requested that staff perform consistent mouth checks after medication administration to ensure that patient is not cheeking his medications.  Plan is to continue [place] LAI and oral Risperdal for now.  Patient may need to remain on oral antipsychotic agent in addition to [place] long-term to adequately address psychotic symptoms.  Daily contact with patient to assess and evaluate symptoms and progress in treatment and Medication management  Schizoaffective disorder -Continue Risperdal M tabs 2 mg PO QAM and 3 mg PO QHS for psychotic symptoms for now.   -Continue Invega Sustenna LAI q. 28 days.  First loading dose of 234 mg IM administered 12/20/2020.  Second loading dose of 156 mg IM was administered on 12/24/2020.  Plan is for  maintenance dosing of Invega Sustenna LAI 234 mg IM every 28 days with next dose due approximately 01/21/2021. -Continue Trileptal to 150 mg twice daily with plan for eventual discontinuation prior to discharge.       Anxiety/EPS -Continue propranolol 40 mg Q8H for restlessness and anxiety -Continue benztropine 0.5 mg PO Q6H PRN for tremors, muscle stiffness -Continue diphenhydramine 50 mg IM every 6 hours as needed for acute dystonic reaction  Diabetes Mellitus -Continue metformin 500 mg twice daily with meals  Agitation  -Continue lorazepam 1 mg PO Q6H PRN anxiety, restlessness, agitation -Continue lorazepam 2 mg IM BID PRN for severe agitation only if patient unable to take oral PRN medications for agitation  Anxiety -Continue hydroxyzine 25 mg Q6H PRN  Insomnia -Continue gabapentin 300 mg at bedtime for RLS, sleep -Continue doxepin 10 mg at bedtime as needed for sleep.  Constipation -Continue MiraLAX 17 g daily -Continue MOM 30 mL daily PRN -Encourage intake of p.o. fluids  Positive COVID test on 12/27/2020 -Continue airborne and contact precautions -Patient remains afebrile and is denying symptoms  Discharge planning in progress.  Social work has encouraged family to petition for guardian to be appointed for patient.  Housing plan needs to be clarified.  Arthor Captain, MD 12/28/2020, 6:12 PM

## 2020-12-28 NOTE — Group Note (Signed)
Recreation Therapy Group Note   Group Topic:Anger Management  Group Date: 12/28/2020 Start Time: 0930 End Time:  Facilitators: Caroll Rancher, LRT/CTRS Location:  [address]  Group Description:  LRT provided patients with a packet reviewing anger management is by recognizing what cause anger, how thoughts and feelings play a part in emotions, etc.  Patients were to complete packets in their rooms or dayroom.    Affect/Mood: N/A   Participation Level: Did not attend    Clinical Observations/Individualized Feedback: Due to COVID being on the unit group was not held as scheduled.  LRT left packets for patients dealing with anger management.  Packets consisted of good thoughts/bad thoughts, getting to know your own anger, consequences of acting out of anger and ways to reduce anger.     Plan: Continue to engage patient in RT group sessions 2-3x/week.   Caroll Rancher, LRT/CTRS 12/28/2020 2:15 PM

## 2020-12-28 NOTE — Progress Notes (Signed)
Pt did not attend psychoeducational group. 

## 2020-12-29 MED ORDER — DOXEPIN HCL 10 MG PO CAPS
10.0000 mg | ORAL_CAPSULE | Freq: Every day | ORAL | Status: DC
  Administered 2020-12-29 – 2021-01-04 (×7): 10 mg via ORAL
  Filled 2020-12-29 (×9): qty 1

## 2020-12-29 NOTE — [facility] Group Notes (Signed)
[facility] Group Notes:  (Nursing/MHT/Case Management/Adjunct)  Date:  12/29/2020  Time:  2:14 PM  Type of Therapy:   1:1 Orientation/Goals Group  Participation Level:  Active  Participation Quality:  Appropriate  Affect:  Appropriate  Cognitive:  Appropriate  Insight:  Appropriate  Engagement in Group:  Developing/Improving and Engaged  Modes of Intervention:  Orientation  Summary of Progress/Problems: Pt goal for today is to lose body fat and to take medications.   Noah Ramsey J [name] 12/29/2020, 2:14 PM

## 2020-12-29 NOTE — Progress Notes (Signed)
Pt did not attend/participate in 1:1 psycho-ed group. 

## 2020-12-29 NOTE — Progress Notes (Signed)
[facility], [place] MD Progress Note  12/29/2020 6:05 PM Noah Ramsey  MRN:  [id number]  Reason for admission: Patient is a 25 year old male with history of schizoaffective disorder admitted for insomnia x 5 days, aggressive behavior, delusions, suicidal ideation and auditory hallucinations.  Objective: Medical record reviewed.  Patient's case discussed in detail with members of the treatment team.  I met with and evaluated the patient on the unit today for follow-up today.  Patient is unchanged from yesterday.  He continues to make paranoid and grandiose delusional statements about demons, past lives and "the [place]."  Patient states that he does not need antipsychotics and only needs weight loss supplements and steroids.  He is focused on desire to lose weight.  Patient makes multiple illogical statements about medical diagnoses and treatments.  He denies SI, HI or depressed mood.  Patient reports that he has been eating well.  Patient states that he typically sleeps during the day at home and is awake at night.  Patient acknowledges that he has been making himself throw up after medication administration when he is able.  He denies COVID symptoms or other physical problems.  Staff document the patient slept less than an hour last night.  Staff observed patient throwing up after medication administration and report that he was repeatedly flushing the toilet last night.  He has required repeated redirection to take scheduled medications.  Principal Problem: Schizoaffective disorder ([place]) Diagnosis: Principal Problem:   Schizoaffective disorder ([place])  Total Time spent with patient:  20 minutes  Past Psychiatric History: See admission H&P  Past Medical History:  Past Medical History:  Diagnosis Date   Elevated CPK    per patient   Schizophrenia ([place])    History reviewed. No pertinent surgical history. Family History:  Family History  Problem Relation Age of Onset   Mental illness Brother    Family  Psychiatric  History: See admission H&P Social History:  Social History   Substance and Sexual Activity  Alcohol Use Never     Social History   Substance and Sexual Activity  Drug Use Never    Social History   Socioeconomic History   Marital status: Single    Spouse name: Not on file   Number of children: Not on file   Years of education: Not on file   Highest education level: Not on file  Occupational History   Not on file  Tobacco Use   Smoking status: Never   Smokeless tobacco: Never  Substance and Sexual Activity   Alcohol use: Never   Drug use: Never   Sexual activity: Not on file  Other Topics Concern   Not on file  Social History Narrative   Not on file   Social Determinants of Health   Financial Resource Strain: Not on file  Food Insecurity: Not on file  Transportation Needs: Not on file  Physical Activity: Not on file  Stress: Not on file  Social Connections: Not on file   Additional Social History:                         Sleep: Poor.  Patient has reversed sleep-wake cycle and sleeps during the day and stays up at night.  Appetite:  Good  Current Medications: Current Facility-Administered Medications  Medication Dose Route Frequency Provider Last Rate Last Admin   acetaminophen (TYLENOL) tablet 650 mg  650 mg Oral Q6H PRN Rankin, Shuvon B, NP   650 mg at 12/14/20 0203  alum & mag hydroxide-simeth (MAALOX/MYLANTA) 200-200-20 MG/5ML suspension 30 mL  30 mL Oral Q4H PRN Rankin, Shuvon B, NP       benztropine (COGENTIN) tablet 0.5 mg  0.5 mg Oral Q6H PRN Arthor Captain, MD       diphenhydrAMINE (BENADRYL) injection 50 mg  50 mg Intramuscular Q6H PRN Arthor Captain, MD       doxepin (SINEQUAN) capsule 10 mg  10 mg Oral QHS Lavella Hammock, MD       gabapentin (NEURONTIN) capsule 300 mg  300 mg Oral QHS Arthor Captain, MD   300 mg at 12/28/20 2138   hydrOXYzine (ATARAX/VISTARIL) tablet 25 mg  25 mg Oral Q6H PRN Arthor Captain, MD   25 mg  at 12/25/20 2126   LORazepam (ATIVAN) injection 2 mg  2 mg Intramuscular BID PRN Arthor Captain, MD       LORazepam (ATIVAN) tablet 1 mg  1 mg Oral Q6H PRN Arthor Captain, MD   1 mg at 12/27/20 0951   magnesium hydroxide (MILK OF MAGNESIA) suspension 30 mL  30 mL Oral Daily PRN Rankin, Shuvon B, NP       metFORMIN (GLUCOPHAGE) tablet 500 mg  500 mg Oral BID WC Rankin, Shuvon B, NP   500 mg at 12/29/20 1732   ondansetron (ZOFRAN) tablet 4 mg  4 mg Oral Q8H PRN Arthor Captain, MD       OXcarbazepine (TRILEPTAL) tablet 150 mg  150 mg Oral BID Arthor Captain, MD   150 mg at 12/29/20 1732   [START ON 01/21/2021] paliperidone (INVEGA SUSTENNA) injection 234 mg  234 mg Intramuscular Q28 days Arthor Captain, MD       polyethylene glycol (MIRALAX / GLYCOLAX) packet 17 g  17 g Oral Daily Arthor Captain, MD   17 g at 12/29/20 0829   propranolol (INDERAL) tablet 40 mg  40 mg Oral Q8H Arthor Captain, MD   40 mg at 12/29/20 0830   risperiDONE (RISPERDAL M-TABS) disintegrating tablet 2 mg  2 mg Oral Daily Arthor Captain, MD   2 mg at 12/29/20 2542   risperiDONE (RISPERDAL M-TABS) disintegrating tablet 3 mg  3 mg Oral QHS Arthor Captain, MD   3 mg at 12/28/20 2138    Lab Results:  No results found for this or any previous visit (from the past 48 hour(s)).     Blood Alcohol level:  Lab Results  Component Value Date   ETH <10 11/29/2020   ETH <10 70/62/3762    Metabolic Disorder Labs: Lab Results  Component Value Date   HGBA1C 5.5 12/04/2020   MPG 111.15 12/04/2020   No results found for: PROLACTIN Lab Results  Component Value Date   CHOL 123 12/02/2020   TRIG 74 12/02/2020   HDL 35 (L) 12/02/2020   CHOLHDL 3.5 12/02/2020   VLDL 15 12/02/2020   LDLCALC 73 12/02/2020   LDLCALC 101 (H) 06/03/2019    Physical Findings: AIMS: Facial and Oral Movements Muscles of Facial Expression: None, normal Lips and Perioral Area: None, normal Jaw: None, normal Tongue: None, normal,Extremity  Movements Upper (arms, wrists, hands, fingers): None, normal Lower (legs, knees, ankles, toes): None, normal, Trunk Movements Neck, shoulders, hips: None, normal, Overall Severity Severity of abnormal movements (highest score from questions above): None, normal Incapacitation due to abnormal movements: None, normal Patient's awareness of abnormal movements (rate only patient's report): No Awareness, Dental Status Current problems with teeth and/or  dentures?: No Does patient usually wear dentures?: No  CIWA:    COWS:     Musculoskeletal: Strength & Muscle Tone: within normal limits Gait & Station: normal Patient leans: N/A  Psychiatric Specialty Exam:  Presentation  General Appearance: Fairly Groomed  Eye Contact:Good  Speech:Normal Rate  Speech Volume:Normal  Handedness:Right   Mood and Affect  Mood:Anxious  Affect:Congruent   Thought Process  Thought Processes:Disorganized  Descriptions of Associations:Loose  Orientation:Full (Time, Place and Person)  Thought Content:Delusions; Paranoid Ideation  History of Schizophrenia/Schizoaffective disorder:Yes  Duration of Psychotic Symptoms:Greater than six months  Hallucinations:Hallucinations: Auditory  Ideas of Reference:Delusions; Paranoia  Suicidal Thoughts:Suicidal Thoughts: No  Homicidal Thoughts:Homicidal Thoughts: No   Sensorium  Memory:Immediate Fair; Recent Fair  Judgment:Impaired  Insight:Shallow   Executive Functions  Concentration:Fair  Attention Span:Fair  [place]  Language:Good   Psychomotor Activity  Psychomotor Activity:Psychomotor Activity: Normal   Assets  Assets:Communication Skills; Desire for Improvement; Social Support; Housing   Sleep  Sleep:Sleep: Poor Number of Hours of Sleep: 1    Physical Exam: Physical Exam Vitals and nursing note reviewed.  Constitutional:      General: He is not in acute distress.    Appearance: He is  not diaphoretic.  HENT:     Head: Normocephalic and atraumatic.  Cardiovascular:     Rate and Rhythm: Normal rate.  Pulmonary:     Effort: Pulmonary effort is normal.  Neurological:     General: No focal deficit present.     Mental Status: He is alert and oriented to person, place, and time.   Review of Systems  Constitutional:  Negative for chills, diaphoresis and fever.  HENT:  Negative for sore throat.   Respiratory:  Negative for cough and shortness of breath.   Cardiovascular:  Negative for chest pain and palpitations.  Gastrointestinal:  Negative for constipation, diarrhea, nausea and vomiting.  Musculoskeletal:  Positive for joint pain. Negative for myalgias.       Positive for chronic left knee pain.  Neurological:  Negative for dizziness, tremors, seizures and headaches.  Psychiatric/Behavioral:  Positive for hallucinations. Negative for depression, substance abuse and suicidal ideas. The patient is nervous/anxious and has insomnia.        Positive for reversed sleep-wake cycle; patient states he prefers to sleep during the day  All other systems reviewed and are negative. Blood pressure (P) 129/66, pulse (P) 100, temperature 97.7 F (36.5 C), temperature source Oral, resp. rate 16, height 5' 6"  (1.676 m), weight 134.3 kg, SpO2 100 %. Body mass index is 47.78 kg/m.   Treatment Plan Summary: Patient is a 25 year old male with schizoaffective disorder, bipolar type admitted for treatment and stabilization of worsening psychotic and mood symptoms, suicidal ideation and aggressive behavior.  Patient is displaying some improvement in organization of thought processes and is less exclusively focused on delusional themes.  Patient has tolerated the first 2 loading doses of Invega Sustenna LAI.  Patient insisted on receiving first two [place] LAI loading doses via gluteal rather than deltoid injection.  Patient continues on oral Risperdal but has appeared more disorganized in  recent days.  He has been trying to refuse medications or vomit after taking medications.   I have requested that staff perform consistent mouth checks after medication administration to ensure that patient is not cheeking his medications.  Staff are now having patient sit in the day room for 30 minutes after medication administration to prevent patient from vomiting meds.  Plan is  to continue [place] LAI and oral Risperdal for now.  Patient may need to remain on oral antipsychotic agent in addition to [facility] long-term to adequately address psychotic symptoms.  Daily contact with patient to assess and evaluate symptoms and progress in treatment and Medication management  Schizoaffective disorder -Continue Risperdal M tabs 2 mg PO QAM and 3 mg PO QHS for psychotic symptoms for now.   -Continue Invega Sustenna LAI q. 28 days.  First loading dose of 234 mg IM administered 12/20/2020.  Second loading dose of 156 mg IM was administered on 12/24/2020.  Plan is for maintenance dosing of Invega Sustenna LAI 234 mg IM every 28 days with next dose due approximately 01/21/2021. -Continue Trileptal to 150 mg twice daily with plan for eventual discontinuation prior to discharge.       Anxiety/EPS -Continue propranolol 40 mg Q8H for restlessness and anxiety -Continue benztropine 0.5 mg PO Q6H PRN for tremors, muscle stiffness -Continue diphenhydramine 50 mg IM every 6 hours as needed for acute dystonic reaction  Diabetes Mellitus -Continue metformin 500 mg twice daily with meals  Agitation  -Continue lorazepam 1 mg PO Q6H PRN anxiety, restlessness, agitation -Continue lorazepam 2 mg IM BID PRN for severe agitation only if patient unable to take oral PRN medications for agitation  Anxiety -Continue hydroxyzine 25 mg Q6H PRN  Insomnia -Continue gabapentin 300 mg at bedtime for RLS, sleep -Continue doxepin 10 mg at bedtime as needed for sleep.  Constipation -Continue MiraLAX 17 g  daily -Continue MOM 30 mL daily PRN -Encourage intake of p.o. fluids  Positive COVID test on 12/27/2020 -Continue airborne and contact precautions -Patient remains afebrile and is denying symptoms  Discharge planning in progress.  Social work has encouraged family to petition for guardian to be appointed for patient.  Housing plan needs to be clarified.  Arthor Captain, MD 12/29/2020, 6:05 PM

## 2020-12-29 NOTE — Progress Notes (Signed)
   12/29/20 0550  Vital Signs  Pulse Rate 82  Resp 16  BP (!) 160/87  BP Location Right Arm  BP Method Automatic  Patient Position (if appropriate) Standing   Pt refused blood pressure medication this morning. Pt did this yesterday morning. Moved administration to 0730 to take with morning medications if pt chooses. Pt denies dizziness or lightheadedness.

## 2020-12-29 NOTE — Group Note (Signed)
Recreation Therapy Group Note   Group Topic:Health and Wellness  Group Date: 12/29/2020 Start Time: 1015 End Time:  Facilitators: Caroll Rancher, LRT/CTRS Location: [address] Hall Dayroom   Goal Area(s) Addresses:  Patient will identify benefits of exercise on mental health. Patient will identify was to improve overall wellbeing.  Description: LRT gave patients packets dealing with the importance of mental health and how overall wellbeing plays a role in achieving good mental health.    Clinical Observations/Individualized Feedback: Due to COVID on the hall, LRT left packets for patients dealing with mental health and exercise.  Packets addressed the impact exercise has on mental health, find time to exercise, strategies to remember to exercise, and ways to improve to wellbeing.     Plan: Continue to engage patient in RT group sessions 2-3x/week.   Caroll Rancher, LRT/CTRS 12/29/2020 1:48 PM

## 2020-12-29 NOTE — Progress Notes (Signed)
Dar Note: Patient presents with a calm affect and mood.  Denies suicidal thoughts, auditory and visual hallucinations.  Medications given as prescribed.  Refused his 2 pm Propanolol after several encouragements.  Patient visible in the dayroom with minimal interaction.  Routine safety checks maintained.  Patient is safe on the unit.

## 2020-12-29 NOTE — Progress Notes (Signed)
Pt can be heard constantly flushing toilet in room. Pt cautioned not to do this. Pt encouraged to get some sleep. "Thanks for checking on me." Pt denies anything is wrong. Will continue to monitor.

## 2020-12-29 NOTE — [facility] Group Notes (Signed)
Type of Therapy:  Group Therapy   Modes of Intervention:  Self Affirmations   Due to the COVID-19 pandemic, this group has been supplemented with worksheets.   Summary of Progress/Problems: CSW provided worksheet to patient due to COVID. CSW offered to meet individually with patient as needed   Adalida Garver, LCSWA Clinicial Social Worker [facility]  

## 2020-12-29 NOTE — [facility] Counselor (Signed)
CSW left voicemail for this pt's mother to discuss pt returning home with current Covid positive status.     Ruthann Cancer MSW, LCSW Clincal Social Worker  [facility]

## 2020-12-29 NOTE — Progress Notes (Signed)
Pt has not slept tonight. Pt still flushing toilet and refilling water bottles in the bathroom even after being constantly redirected not to do so. Pt not aggressive or threatening, just not following directions.

## 2020-12-29 NOTE — Progress Notes (Signed)
   12/29/20 2200  Psych Admission Type (Psych Patients Only)  Admission Status Involuntary  Psychosocial Assessment  Patient Complaints None  Eye Contact Brief  Facial Expression Flat  Affect Preoccupied  Speech Soft  Interaction Guarded  Motor Activity Slow  Appearance/Hygiene Unremarkable;Poor hygiene  Behavior Characteristics Cooperative;Guarded  Mood Suspicious  Thought Process  Coherency Circumstantial  Content Preoccupation  Delusions None reported or observed  Perception Hallucinations  Hallucination None reported or observed  Judgment Poor  Confusion None  Danger to Self  Current suicidal ideation? Denies  Danger to Others  Danger to Others None reported or observed   Pt sleeping earlier this shift and just awoke. Pt denies SI, HI, AVH and pain. Denies anxiety and depression. Pt took evening  medication without incident. Told he was to sit in dayroom for 30 minutes to allow medicine time to get into his system. Pt given snacks and this writer sits in dayroom with him.

## 2020-12-30 NOTE — [facility] Group Notes (Signed)
[facility] Group Notes:  (Nursing/MHT/Case Management/Adjunct)  Date:  12/30/2020  Time:  12:04 PM  Type of Therapy:   1:1 Psycho-Ed Group  Participation Level:  Active  Participation Quality:  Appropriate  Affect:  Appropriate  Cognitive:  Appropriate  Insight:  Appropriate and Improving  Engagement in Group:  Engaged and Improving  Modes of Intervention:  Education  Summary of Progress/Problems: Topic was anger management. Pt says that he does not get angry a lot but when he does, the things that do help him calm down or take his mind off of it are listening to music, exercising and avoiding the person that does make him upset.   Noah Ramsey 12/30/2020, 12:04 PM

## 2020-12-30 NOTE — Progress Notes (Signed)
[facility] MD Progress Note  12/30/2020 4:51 PM Noah Ramsey  MRN:  [id number]  Reason for admission: Patient is a 25 year old male with history of schizoaffective disorder admitted for insomnia x 5 days, aggressive behavior, delusions, suicidal ideation and auditory hallucinations.  Objective: Medical record reviewed.  Patient's case discussed in detail with members of the treatment team.  Patient slept Number of Hours: 5.75  I met with and evaluated the patient on the unit today for follow-up today.  Patient appears stable.  He voices concerns about vampires today.  He is able to reality test regarding vampires being not real and humans being real.  Patient believes that "the antipsychotics make me think about vampires."  Reviewed indications for antipsychotics, and patient is agreeable to taking medications as administered.  Patient previously acknowledged that he has been making himself throw up after medication administration when he is able. He denies SI, HI or depressed mood.  He continues to endorse auditory and visual hallucinations.  Patient reports that he has been eating well.  Patient states that he typically sleeps during the day at home and is awake at night.   He denies COVID symptoms or other physical problems.  He is hopeful to discharge soon after his COVID quarantine is over.    Principal Problem: Schizoaffective disorder ([place]) Diagnosis: Principal Problem:   Schizoaffective disorder ([place])  Total Time Spent in Direct Patient Care:  I personally spent 35 minutes on the unit in direct patient care. The direct patient care time included face-to-face time with the patient, reviewing the patient's chart, communicating with other professionals, and coordinating care. Greater than 50% of this time was spent in counseling or coordinating care with the patient regarding goals of hospitalization, psycho-education, and discharge planning needs.   Past Psychiatric History: See admission  H&P  Past Medical History:  Past Medical History:  Diagnosis Date   Elevated CPK    per patient   Schizophrenia [facility])    History reviewed. No pertinent surgical history. Family History:  Family History  Problem Relation Age of Onset   Mental illness Brother    Family Psychiatric  History: See admission H&P Social History:  Social History   Substance and Sexual Activity  Alcohol Use Never     Social History   Substance and Sexual Activity  Drug Use Never    Social History   Socioeconomic History   Marital status: Single    Spouse name: Not on file   Number of children: Not on file   Years of education: Not on file   Highest education level: Not on file  Occupational History   Not on file  Tobacco Use   Smoking status: Never   Smokeless tobacco: Never  Substance and Sexual Activity   Alcohol use: Never   Drug use: Never   Sexual activity: Not on file  Other Topics Concern   Not on file  Social History Narrative   Not on file   Social Determinants of Health   Financial Resource Strain: Not on file  Food Insecurity: Not on file  Transportation Needs: Not on file  Physical Activity: Not on file  Stress: Not on file  Social Connections: Not on file   Additional Social History:                         Sleep: Poor.  Patient has reversed sleep-wake cycle and sleeps during the day and stays up at night.  Improved overnight  last night  Appetite:  Good  Current Medications: Current Facility-Administered Medications  Medication Dose Route Frequency Provider Last Rate Last Admin   acetaminophen (TYLENOL) tablet 650 mg  650 mg Oral Q6H PRN Rankin, Shuvon B, NP   650 mg at 12/14/20 0203   alum & mag hydroxide-simeth (MAALOX/MYLANTA) 200-200-20 MG/5ML suspension 30 mL  30 mL Oral Q4H PRN Rankin, Shuvon B, NP       benztropine (COGENTIN) tablet 0.5 mg  0.5 mg Oral Q6H PRN Arthor Captain, MD       diphenhydrAMINE (BENADRYL) injection 50 mg  50 mg  Intramuscular Q6H PRN Arthor Captain, MD       doxepin (SINEQUAN) capsule 10 mg  10 mg Oral QHS Lavella Hammock, MD   10 mg at 12/29/20 2150   gabapentin (NEURONTIN) capsule 300 mg  300 mg Oral QHS Arthor Captain, MD   300 mg at 12/29/20 2150   hydrOXYzine (ATARAX/VISTARIL) tablet 25 mg  25 mg Oral Q6H PRN Arthor Captain, MD   25 mg at 12/25/20 2126   LORazepam (ATIVAN) injection 2 mg  2 mg Intramuscular BID PRN Arthor Captain, MD       LORazepam (ATIVAN) tablet 1 mg  1 mg Oral Q6H PRN Arthor Captain, MD   1 mg at 12/27/20 0951   magnesium hydroxide (MILK OF MAGNESIA) suspension 30 mL  30 mL Oral Daily PRN Rankin, Shuvon B, NP       metFORMIN (GLUCOPHAGE) tablet 500 mg  500 mg Oral BID WC Rankin, Shuvon B, NP   500 mg at 12/30/20 0741   ondansetron (ZOFRAN) tablet 4 mg  4 mg Oral Q8H PRN Arthor Captain, MD       OXcarbazepine (TRILEPTAL) tablet 150 mg  150 mg Oral BID Arthor Captain, MD   150 mg at 12/30/20 0741   [START ON 01/21/2021] paliperidone (INVEGA SUSTENNA) injection 234 mg  234 mg Intramuscular Q28 days Arthor Captain, MD       polyethylene glycol (MIRALAX / GLYCOLAX) packet 17 g  17 g Oral Daily Arthor Captain, MD   17 g at 12/30/20 0741   propranolol (INDERAL) tablet 40 mg  40 mg Oral Q8H Arthor Captain, MD   40 mg at 12/30/20 1500   risperiDONE (RISPERDAL M-TABS) disintegrating tablet 2 mg  2 mg Oral Daily Arthor Captain, MD   2 mg at 12/30/20 0741   risperiDONE (RISPERDAL M-TABS) disintegrating tablet 3 mg  3 mg Oral QHS Arthor Captain, MD   3 mg at 12/29/20 2150    Lab Results:  No results found for this or any previous visit (from the past 48 hour(s)).     Blood Alcohol level:  Lab Results  Component Value Date   ETH <10 11/29/2020   ETH <10 51/76/1607    Metabolic Disorder Labs: Lab Results  Component Value Date   HGBA1C 5.5 12/04/2020   MPG 111.15 12/04/2020   No results found for: PROLACTIN Lab Results  Component Value Date   CHOL 123 12/02/2020    TRIG 74 12/02/2020   HDL 35 (L) 12/02/2020   CHOLHDL 3.5 12/02/2020   VLDL 15 12/02/2020   LDLCALC 73 12/02/2020   LDLCALC 101 (H) 06/03/2019    Physical Findings: AIMS: Facial and Oral Movements Muscles of Facial Expression: None, normal Lips and Perioral Area: None, normal Jaw: None, normal Tongue: None, normal,Extremity Movements Upper (arms, wrists, hands, fingers): None, normal Lower (  legs, knees, ankles, toes): None, normal, Trunk Movements Neck, shoulders, hips: None, normal, Overall Severity Severity of abnormal movements (highest score from questions above): None, normal Incapacitation due to abnormal movements: None, normal Patient's awareness of abnormal movements (rate only patient's report): No Awareness, Dental Status Current problems with teeth and/or dentures?: No Does patient usually wear dentures?: No  CIWA:    COWS:     Musculoskeletal: Strength & Muscle Tone: within normal limits Gait & Station: normal Patient leans: N/A  Psychiatric Specialty Exam:  Presentation  General Appearance: Fairly Groomed  Eye Contact:Good  Speech:Normal Rate  Speech Volume:Normal  Handedness:Right   Mood and Affect  Mood:Euthymic  Affect:Congruent; Constricted   Thought Process  Thought Processes:Goal Directed  Descriptions of Associations:Loose  Orientation:Full (Time, Place and Person)  Thought Content:Delusions; Paranoid Ideation  History of Schizophrenia/Schizoaffective disorder:Yes  Duration of Psychotic Symptoms:Greater than six months  Hallucinations:Hallucinations: Auditory; Visual Description of Auditory Hallucinations: vampires Description of Visual Hallucinations: vampires  Ideas of Reference:Delusions  Suicidal Thoughts:Suicidal Thoughts: No  Homicidal Thoughts:Homicidal Thoughts: No   Sensorium  Memory:Immediate Fair; Recent Fair  Judgment:Impaired  Insight:Shallow   Executive Functions  Concentration:Fair  Attention  Span:Fair  [place]  Language:Good   Psychomotor Activity  Psychomotor Activity:Psychomotor Activity: Normal   Assets  Assets:Communication Skills; Desire for Improvement; Social Support; Housing   Sleep  Sleep:Sleep: Fair Number of Hours of Sleep: 5.75    Physical Exam: Physical Exam Vitals and nursing note reviewed. Exam conducted with a chaperone present.  Constitutional:      General: He is not in acute distress.    Appearance: He is obese. He is not diaphoretic.  HENT:     Head: Normocephalic and atraumatic.  Cardiovascular:     Rate and Rhythm: Normal rate.  Pulmonary:     Effort: Pulmonary effort is normal. No respiratory distress.  Musculoskeletal:        General: Normal range of motion.  Neurological:     General: No focal deficit present.     Mental Status: He is alert and oriented to person, place, and time.   Review of Systems  Constitutional:  Negative for chills, diaphoresis, fever and malaise/fatigue.  HENT:  Negative for congestion and sore throat.   Respiratory:  Negative for cough, sputum production and shortness of breath.   Cardiovascular:  Negative for chest pain and palpitations.  Gastrointestinal:  Negative for constipation, diarrhea, nausea and vomiting.  Musculoskeletal:  Positive for joint pain. Negative for myalgias.       Positive for chronic left knee pain.  Neurological:  Negative for dizziness, tremors, seizures and headaches.  Psychiatric/Behavioral:  Positive for hallucinations. Negative for depression, substance abuse and suicidal ideas. The patient is nervous/anxious and has insomnia.        Positive for reversed sleep-wake cycle; patient states he prefers to sleep during the day  All other systems reviewed and are negative. Blood pressure (!) 153/109, pulse 93, temperature 97.8 F (36.6 C), temperature source Oral, resp. rate 16, height 5' 6"  (1.676 m), weight 134.3 kg, SpO2 99 %. Body mass index is  47.78 kg/m.   Treatment Plan Summary: Patient is a 25 year old male with schizoaffective disorder, bipolar type admitted for treatment and stabilization of worsening psychotic and mood symptoms, suicidal ideation and aggressive behavior.  Patient is displaying some improvement in organization of thought processes and is less exclusively focused on delusional themes.  Patient has tolerated the first 2 loading doses of Invega Sustenna LAI.  Patient insisted on receiving first two [place] LAI loading doses via gluteal rather than deltoid injection.  Patient continues on oral Risperdal but has appeared more disorganized in recent days.  He has been trying to refuse medications or vomit after taking medications.  Staff has been requested to perform consistent mouth checks after medication administration to ensure that patient is not cheeking his medications.  Staff are now having patient sit in the day room for 30 minutes after medication administration to prevent patient from vomiting meds.  Plan is to continue [place] LAI and oral Risperdal for now.  Patient may need to remain on oral antipsychotic agent in addition to [place] long-term to adequately address psychotic symptoms.  Daily contact with patient to assess and evaluate symptoms and progress in treatment and Medication management  Schizoaffective disorder -Continue Risperdal M tabs 2 mg PO QAM and 3 mg PO QHS for psychotic symptoms for now.   -Continue Invega Sustenna LAI q. 28 days.  First loading dose of 234 mg IM administered 12/20/2020.  Second loading dose of 156 mg IM was administered on 12/24/2020.  Plan is for maintenance dosing of Invega Sustenna LAI 234 mg IM every 28 days with next dose due approximately 01/21/2021. -Continue Trileptal to 150 mg twice daily with plan for eventual discontinuation prior to discharge.       Anxiety/EPS -Continue propranolol 40 mg Q8H for restlessness and anxiety -Continue  benztropine 0.5 mg PO Q6H PRN for tremors, muscle stiffness -Continue diphenhydramine 50 mg IM every 6 hours as needed for acute dystonic reaction  Diabetes Mellitus -Continue metformin 500 mg twice daily with meals  Agitation  -Continue lorazepam 1 mg PO Q6H PRN anxiety, restlessness, agitation -Continue lorazepam 2 mg IM BID PRN for severe agitation only if patient unable to take oral PRN medications for agitation  Anxiety -Continue hydroxyzine 25 mg Q6H PRN  Insomnia -Continue gabapentin 300 mg at bedtime for RLS, sleep -Continue doxepin 10 mg at bedtime as needed for sleep.  Constipation -Continue MiraLAX 17 g daily -Continue MOM 30 mL daily PRN -Encourage intake of p.o. fluids  Positive COVID test on 12/27/2020 -Continue airborne and contact precautions -Patient remains afebrile and is denying symptoms  Discharge planning in progress.  Social work has encouraged family to petition for guardian to be appointed for patient.  Housing plan needs to be clarified.  Lavella Hammock, MD 12/30/2020, 4:51 PM

## 2020-12-30 NOTE — Progress Notes (Signed)
Patient has been isolative to his room. He is still responding to internal stimuli but is pleasant. Writer informed him of his medications to be given tonight. He has been pleasant and cooperative.     12/30/20 2021  Psych Admission Type (Psych Patients Only)  Admission Status Involuntary  Psychosocial Assessment  Patient Complaints None  Eye Contact Brief  Facial Expression Flat  Affect Preoccupied  Speech Soft  Interaction Guarded;Minimal  Motor Activity Slow  Appearance/Hygiene Unremarkable  Behavior Characteristics Cooperative;Appropriate to situation  Mood Preoccupied;Pleasant  Thought Process  Coherency Circumstantial  Content Preoccupation  Delusions None reported or observed  Perception Hallucinations  Hallucination None reported or observed  Judgment Poor  Confusion None  Danger to Self  Current suicidal ideation? Denies  Danger to Others  Danger to Others None reported or observed

## 2020-12-30 NOTE — Progress Notes (Signed)
Pt has been constantly fixated on being in the bathroom all morning. Pt has been flushing the toilet without it being used consistently, filling up water bottles with the bathroom sink water and drinking it. Pt has been redirected to leave the bathroom alone and has been educated on the bathroom sink water not being the best option available for consumption. Pt states "It tastes like chlorine and makes me go to the bathroom to drop body fat!" Pt has also been seen consistently talking and laughing to himself while making punching motions in the air as if he is fighting someone. Pt will continue to be redirected and monitored on the unit.

## 2020-12-30 NOTE — [facility] Group Notes (Signed)
[facility] Group Notes:  (Nursing/MHT/Case Management/Adjunct)  Date:  12/30/2020  Time:  9:50 AM  Type of Therapy:   1:1 Orientation/Goals Group  Participation Level:  Minimal  Participation Quality:  Appropriate  Affect:  Not Congruent  Cognitive:  Disorganized, Confused, and Delusional  Insight:  Lacking  Engagement in Group:  Lacking and Limited  Modes of Intervention:  Orientation  Summary of Progress/Problems: Pt goal for today is to act like he is in a hotel and drink plenty of water.   Noah Ramsey J Tanveer Brammer 12/30/2020, 9:50 AM

## 2020-12-30 NOTE — Group Note (Signed)
Clinical Social Work Note  No group could be held today due to a COVID-19 outbreak on the unit.  An educational packet about anger management was provided to the patient to work on individually.  Noah Mantle, LCSW 12/30/2020  .now

## 2020-12-30 NOTE — Progress Notes (Addendum)
Pt is talkative this morning and said he slept well last night.  Pt denies SI/HI but reports hearing voices.  Pt took medications without incident and no adverse reactions were noted.  MHT informed this Clinical research associate that pt is acting bizarre in his room, filling up water bottles in his bathroom sink and constantly flushing the  toilet unnecessarily. MHT  talked with pt and pt has calmed down at this time. RN will continue to monitor pt's status and will provide support as needed.  12/30/20 0740  Psych Admission Type (Psych Patients Only)  Admission Status Involuntary  Psychosocial Assessment  Patient Complaints Malaise  Eye Contact Brief  Facial Expression Flat  Affect Preoccupied  Speech Soft  Interaction Guarded  Motor Activity Slow  Appearance/Hygiene Unremarkable;Poor hygiene  Behavior Characteristics Guarded;Impulsive  Mood Pleasant  Thought Process  Coherency Circumstantial  Content Preoccupation  Delusions None reported or observed  Perception Hallucinations  Hallucination None reported or observed  Judgment Poor  Confusion None  Danger to Self  Current suicidal ideation? Denies  Danger to Others  Danger to Others None reported or observed

## 2020-12-31 NOTE — Progress Notes (Signed)
[facility] MD Progress Note  12/31/2020 2:06 PM Noah Ramsey  MRN:  [id number]  Reason for admission: Patient is a 25 year old male with history of schizoaffective disorder admitted for insomnia x 5 days, aggressive behavior, delusions, suicidal ideation and auditory hallucinations.  Objective: Medical record reviewed.  Patient's case discussed in detail with members of the treatment team.  Patient slept Number of Hours: 6.75  I met with and evaluated the patient on the unit today for follow-up today.  Patient appears stable.  He is denying any AVH today.  He has been awake and completing self-hygiene. He report good appetite. He denies somatic complaints.  He denies URI or other COVID symptoms. He has been medication compliant. He has not been requiring PRN medication. He denies SI, HI.    Principal Problem: Schizoaffective disorder ([place]) Diagnosis: Principal Problem:   Schizoaffective disorder ([place])  Total Time Spent in Direct Patient Care:  I personally spent 20 minutes on the unit in direct patient care. The direct patient care time included face-to-face time with the patient, reviewing the patient's chart, communicating with other professionals, and coordinating care. Greater than 50% of this time was spent in counseling or coordinating care with the patient regarding goals of hospitalization, psycho-education, and discharge planning needs.   Past Psychiatric History: See admission H&P  Past Medical History:  Past Medical History:  Diagnosis Date   Elevated CPK    per patient   Schizophrenia [facility])    History reviewed. No pertinent surgical history. Family History:  Family History  Problem Relation Age of Onset   Mental illness Brother    Family Psychiatric  History: See admission H&P Social History:  Social History   Substance and Sexual Activity  Alcohol Use Never     Social History   Substance and Sexual Activity  Drug Use Never    Social History   Socioeconomic  History   Marital status: Single    Spouse name: Not on file   Number of children: Not on file   Years of education: Not on file   Highest education level: Not on file  Occupational History   Not on file  Tobacco Use   Smoking status: Never   Smokeless tobacco: Never  Substance and Sexual Activity   Alcohol use: Never   Drug use: Never   Sexual activity: Not on file  Other Topics Concern   Not on file  Social History Narrative   Not on file   Social Determinants of Health   Financial Resource Strain: Not on file  Food Insecurity: Not on file  Transportation Needs: Not on file  Physical Activity: Not on file  Stress: Not on file  Social Connections: Not on file   Additional Social History:                         Sleep: Poor.  Patient has reversed sleep-wake cycle and sleeps during the day and stays up at night.  Improved overnight last 2 nights. Patient slept Number of Hours: 6.75   Appetite:  Good  Current Medications: Current Facility-Administered Medications  Medication Dose Route Frequency Provider Last Rate Last Admin   acetaminophen (TYLENOL) tablet 650 mg  650 mg Oral Q6H PRN Rankin, Shuvon B, NP   650 mg at 12/14/20 0203   alum & mag hydroxide-simeth (MAALOX/MYLANTA) 200-200-20 MG/5ML suspension 30 mL  30 mL Oral Q4H PRN Rankin, Shuvon B, NP       benztropine (COGENTIN) tablet  0.5 mg  0.5 mg Oral Q6H PRN Arthor Captain, MD       diphenhydrAMINE (BENADRYL) injection 50 mg  50 mg Intramuscular Q6H PRN Arthor Captain, MD       doxepin (SINEQUAN) capsule 10 mg  10 mg Oral QHS Lavella Hammock, MD   10 mg at 12/30/20 2053   gabapentin (NEURONTIN) capsule 300 mg  300 mg Oral QHS Arthor Captain, MD   300 mg at 12/30/20 2053   hydrOXYzine (ATARAX/VISTARIL) tablet 25 mg  25 mg Oral Q6H PRN Arthor Captain, MD   25 mg at 12/25/20 2126   LORazepam (ATIVAN) injection 2 mg  2 mg Intramuscular BID PRN Arthor Captain, MD       LORazepam (ATIVAN) tablet 1 mg  1  mg Oral Q6H PRN Arthor Captain, MD   1 mg at 12/27/20 0951   magnesium hydroxide (MILK OF MAGNESIA) suspension 30 mL  30 mL Oral Daily PRN Rankin, Shuvon B, NP       metFORMIN (GLUCOPHAGE) tablet 500 mg  500 mg Oral BID WC Rankin, Shuvon B, NP   500 mg at 12/31/20 0800   ondansetron (ZOFRAN) tablet 4 mg  4 mg Oral Q8H PRN Arthor Captain, MD       OXcarbazepine (TRILEPTAL) tablet 150 mg  150 mg Oral BID Arthor Captain, MD   150 mg at 12/31/20 0801   [START ON 01/21/2021] paliperidone (INVEGA SUSTENNA) injection 234 mg  234 mg Intramuscular Q28 days Arthor Captain, MD       polyethylene glycol (MIRALAX / GLYCOLAX) packet 17 g  17 g Oral Daily Arthor Captain, MD   17 g at 12/30/20 0741   propranolol (INDERAL) tablet 40 mg  40 mg Oral Q8H Arthor Captain, MD   40 mg at 12/31/20 1405   risperiDONE (RISPERDAL M-TABS) disintegrating tablet 2 mg  2 mg Oral Daily Arthor Captain, MD   2 mg at 12/31/20 0800   risperiDONE (RISPERDAL M-TABS) disintegrating tablet 3 mg  3 mg Oral QHS Arthor Captain, MD   3 mg at 12/30/20 2053    Lab Results:  No results found for this or any previous visit (from the past 48 hour(s)).     Blood Alcohol level:  Lab Results  Component Value Date   ETH <10 11/29/2020   ETH <10 92/33/0076    Metabolic Disorder Labs: Lab Results  Component Value Date   HGBA1C 5.5 12/04/2020   MPG 111.15 12/04/2020   No results found for: PROLACTIN Lab Results  Component Value Date   CHOL 123 12/02/2020   TRIG 74 12/02/2020   HDL 35 (L) 12/02/2020   CHOLHDL 3.5 12/02/2020   VLDL 15 12/02/2020   LDLCALC 73 12/02/2020   LDLCALC 101 (H) 06/03/2019    Physical Findings: AIMS: Facial and Oral Movements Muscles of Facial Expression: None, normal Lips and Perioral Area: None, normal Jaw: None, normal Tongue: None, normal,Extremity Movements Upper (arms, wrists, hands, fingers): None, normal Lower (legs, knees, ankles, toes): None, normal, Trunk Movements Neck, shoulders,  hips: None, normal, Overall Severity Severity of abnormal movements (highest score from questions above): None, normal Incapacitation due to abnormal movements: None, normal Patient's awareness of abnormal movements (rate only patient's report): No Awareness, Dental Status Current problems with teeth and/or dentures?: No Does patient usually wear dentures?: No  CIWA:    COWS:     Musculoskeletal: Strength & Muscle Tone: within normal limits  Gait & Station: normal Patient leans: N/A  Psychiatric Specialty Exam:  Presentation  General Appearance: Fairly Groomed  Eye Contact:Good  Speech:Normal Rate  Speech Volume:Normal  Handedness:Right   Mood and Affect  Mood:Euthymic  Affect:Congruent; Constricted   Thought Process  Thought Processes:Goal Directed  Descriptions of Associations:Tangential  Orientation:Full (Time, Place and Person)  Thought Content:Delusions; Paranoid Ideation  History of Schizophrenia/Schizoaffective disorder:Yes  Duration of Psychotic Symptoms:Greater than six months  Hallucinations:Hallucinations: Other (comment) (denies today) Description of Auditory Hallucinations: vampires Description of Visual Hallucinations: vampires  Ideas of Reference:Delusions (denies feeling paranoia today)  Suicidal Thoughts:Suicidal Thoughts: No  Homicidal Thoughts:Homicidal Thoughts: No   Sensorium  Memory:Immediate Fair; Recent Fair  Judgment:Impaired  Insight:Shallow   Executive Functions  Concentration:Fair  Attention Span:Fair  [place]  Language:Good   Psychomotor Activity  Psychomotor Activity:Psychomotor Activity: Normal   Assets  Assets:Communication Skills; Desire for Improvement; Social Support; Housing   Sleep  Sleep:Sleep: Good Number of Hours of Sleep: 6.75    Physical Exam: Physical Exam Vitals and nursing note reviewed. Exam conducted with a chaperone present.  Constitutional:       General: He is not in acute distress.    Appearance: He is obese. He is not diaphoretic.  HENT:     Head: Normocephalic and atraumatic.  Cardiovascular:     Rate and Rhythm: Normal rate.  Pulmonary:     Effort: Pulmonary effort is normal. No respiratory distress.  Musculoskeletal:        General: Normal range of motion.  Neurological:     General: No focal deficit present.     Mental Status: He is alert and oriented to person, place, and time.   Review of Systems  Constitutional:  Negative for chills, diaphoresis, fever and malaise/fatigue.  HENT:  Negative for congestion and sore throat.   Respiratory:  Negative for cough, sputum production and shortness of breath.   Cardiovascular:  Negative for chest pain and palpitations.  Gastrointestinal:  Negative for constipation, diarrhea, nausea and vomiting.  Musculoskeletal:  Positive for joint pain. Negative for myalgias.       Positive for chronic left knee pain.  Neurological:  Negative for dizziness, tremors, seizures and headaches.  Psychiatric/Behavioral:  Negative for depression, hallucinations, substance abuse and suicidal ideas. The patient is not nervous/anxious and does not have insomnia.        Previously had reversed sleep-wake cycle; patient states he prefers to sleep during the day.  Overnight has been improving past 2 nights with assurance of medication compliance.   All other systems reviewed and are negative. Blood pressure 131/82, pulse 87, temperature 98.1 F (36.7 C), temperature source Oral, resp. rate 16, height 5' 6"  (1.676 m), weight 134.3 kg, SpO2 100 %. Body mass index is 47.78 kg/m.   Treatment Plan Summary: Patient is a 25 year old male with schizoaffective disorder, bipolar type admitted for treatment and stabilization of worsening psychotic and mood symptoms, suicidal ideation and aggressive behavior.  Patient is displaying improvement in organization of thought processes and is less exclusively focused on  delusional themes.  Patient has tolerated the first 2 loading doses of Invega Sustenna LAI.  Patient insisted on receiving first two [place] LAI loading doses via gluteal rather than deltoid injection.  Patient continues on oral Risperdal. He has been trying to refuse medications or vomit after taking medications.  Staff has been requested to perform consistent mouth checks after medication administration to ensure that patient is not cheeking his medications.  Staff are now having patient sit in the day room for 30 minutes after medication administration to prevent patient from vomiting meds.  Since patient has been monitored for medication compliance, symptoms have improved and he has reversed his sleep back to night time consolidated sleep. Plan is to continue [place] LAI and oral Risperdal for now.  Patient may need to remain on oral antipsychotic agent in addition to [place] long-term to adequately address psychotic symptoms.  Daily contact with patient to assess and evaluate symptoms and progress in treatment and Medication management  Schizoaffective disorder -Continue Risperdal M tabs 2 mg PO QAM and 3 mg PO QHS for psychotic symptoms for now.   -Continue Invega Sustenna LAI q. 28 days.  First loading dose of 234 mg IM administered 12/20/2020.  Second loading dose of 156 mg IM was administered on 12/24/2020.  Plan is for maintenance dosing of Invega Sustenna LAI 234 mg IM every 28 days with next dose due approximately 01/21/2021. -Continue Trileptal to 150 mg twice daily with plan for eventual discontinuation prior to discharge.       Anxiety/EPS -Continue propranolol 40 mg Q8H for restlessness and anxiety -Continue benztropine 0.5 mg PO Q6H PRN for tremors, muscle stiffness -Continue diphenhydramine 50 mg IM every 6 hours as needed for acute dystonic reaction  Diabetes Mellitus -Continue metformin 500 mg twice daily with meals  Agitation  -Continue lorazepam 1  mg PO Q6H PRN anxiety, restlessness, agitation -Continue lorazepam 2 mg IM BID PRN for severe agitation only if patient unable to take oral PRN medications for agitation  Anxiety -Continue hydroxyzine 25 mg Q6H PRN  Insomnia -Continue gabapentin 300 mg at bedtime for RLS, sleep -Continue doxepin 10 mg at bedtime as needed for sleep.  Constipation -Continue MiraLAX 17 g daily -Continue MOM 30 mL daily PRN -Encourage intake of p.o. fluids  Positive COVID test on 12/27/2020 -Continue airborne and contact precautions -Patient remains afebrile and is denying symptoms  Discharge planning in progress.  Social work has encouraged family to petition for guardian to be appointed for patient.  Housing plan needs to be clarified.  Lavella Hammock, MD 12/31/2020, 2:06 PM

## 2020-12-31 NOTE — [facility] Group Notes (Signed)
Clinical Social Work Note  No group could be held today due to a COVID-19 outbreak on the unit.  An educational packet about building supports was provided to the patient to work on individually.  Aren Cherne Grossman-Orr, LCSW 12/31/2020, 8:46 AM    

## 2020-12-31 NOTE — Progress Notes (Signed)
Writer spoke with patient 1:1 to discuss how his day was. He rpoerts having had a good day. He has been in and out of the dayroom but spends a lot of time in his room in the bathroom. He had inquired about a urinal on last night and writer asked what he needed one for and he reports that he wants to check his urine. Writer informed him that his urine was checked when he was admitted. Writer asked if he remembered to asked his doctor on today and he reported that he forgot. He was encouraged to continue using his toilet in his bathroom. Patient informed of vitals needed before his night medications. Support given and safety maintained with 15 min checks.

## 2020-12-31 NOTE — [facility] Group Notes (Signed)
[facility] Group Notes:  (Nursing/MHT/Case Management/Adjunct)  Date:  12/31/2020  Time:  1:21 PM  Type of Therapy:  1:! Orientation/Goals Group  Participation Level:  Minimal  Participation Quality:  Appropriate  Affect:  Appropriate  Cognitive:  Confused  Insight:  Improving and Lacking  Engagement in Group:  Engaged and Improving  Modes of Intervention:  Orientation  Summary of Progress/Problems: Pt goal for today is to drink plenty of water, exercise and watch football.   Jamoni Broadfoot J Abbi Mancini 12/31/2020, 1:21 PM

## 2020-12-31 NOTE — Progress Notes (Signed)
Progress note  Pt found in bed; compliant with medication administration. Pt continues to be tangential and hypervigilant regarding certain things ie drinking tap water and L-Arganine intake. Pt continues to display magical thinking when assessed. Pt is pleasant. Pt denies si/hi/ah/vh and verbally agrees to approach staff if these become apparent or before harming themselves/others while at [facility].  A: Pt provided support and encouragement. Pt given medication per protocol and standing orders. Q84m safety checks implemented and continued.  R: Pt safe on the unit. Will continue to monitor.

## 2020-12-31 NOTE — Progress Notes (Signed)
Pt did not attend/participate in 1:1 Psycho-Ed Group. 

## 2020-12-31 NOTE — Plan of Care (Signed)
  Problem: Self-Concept: Goal: Ability to identify factors that promote anxiety will improve Outcome: Progressing Goal: Level of anxiety will decrease Outcome: Progressing Goal: Ability to modify response to factors that promote anxiety will improve Outcome: Progressing   

## 2021-01-01 ENCOUNTER — Encounter (HOSPITAL_COMMUNITY): Payer: Self-pay

## 2021-01-01 NOTE — Progress Notes (Signed)
Pt was given worksheets for afternoon psycho-ed group on 9/11 for  to complete in their room. 

## 2021-01-01 NOTE — Progress Notes (Signed)
Pt continues to respond to internal stimuli at times    01/01/21 2100  Psych Admission Type (Psych Patients Only)  Admission Status Involuntary  Psychosocial Assessment  Patient Complaints None  Eye Contact Fair  Facial Expression Animated  Affect Appropriate to circumstance  Speech Logical/coherent  Interaction Assertive  Motor Activity Slow  Appearance/Hygiene Unremarkable  Behavior Characteristics Cooperative  Mood Pleasant  Thought Process  Coherency Concrete thinking  Content Magical thinking  Delusions None reported or observed  Perception Hallucinations  Hallucination Auditory  Judgment Limited  Confusion None  Danger to Self  Current suicidal ideation? Denies  Danger to Others  Danger to Others None reported or observed

## 2021-01-01 NOTE — Group Note (Signed)
LCSW Group Therapy Note   Group Date: 01/01/2021 Start Time: 0100 End Time: 0130   Type of Therapy and Topic:  Group Therapy: Boundaries  Participation Level:  Active  Description of Group: This group will address the use of boundaries in their personal lives. Patients will explore why boundaries are important, the difference between healthy and unhealthy boundaries, and negative and postive outcomes of different boundaries and will look at how boundaries can be crossed.  Patients will be encouraged to identify current boundaries in their own lives and identify what kind of boundary is being set. Facilitators will guide patients in utilizing problem-solving interventions to address and correct types boundaries being used and to address when no boundary is being used. Understanding and applying boundaries will be explored and addressed for obtaining and maintaining a balanced life. Patients will be encouraged to explore ways to assertively make their boundaries and needs known to significant others in their lives, using other group members and facilitator for role play, support, and feedback.  Therapeutic Goals:  1.  Patient will identify areas in their life where setting clear boundaries could be  used to improve their life.  2.  Patient will identify signs/triggers that a boundary is not being respected. 3.  Patient will identify two ways to set boundaries in order to achieve balance in  their lives: 4.  Patient will demonstrate ability to communicate their needs and set boundaries  through discussion and/or role plays  Summary of Patient Progress:  Due to the COVID-19 pandemic, this group has been supplemented with worksheets. CSW provided worksheet to patient due to COVID. CSW offered to meet individually with patient as needed.  Therapeutic Modalities:   Cognitive Behavioral Therapy Solution-Focused Therapy  Chrys Racer 01/01/2021  12:56 PM

## 2021-01-01 NOTE — Progress Notes (Signed)
Progress note  Pt has been reclusive to room. Pt was agitated at lunch with blood pressure medication, stating he didn't need it. Pt took meds with education. Pt denies si/hi/ah/vh and verbally agrees to approach staff if these become apparent or before harming themselves/others while at [facility].  A: Pt provided support and encouragement. Pt given medication per protocol and standing orders. Q81m safety checks implemented and continued.  R: Pt safe on the unit. Will continue to monitor.    01/01/21 0800  Psych Admission Type (Psych Patients Only)  Admission Status Involuntary  Psychosocial Assessment  Patient Complaints None  Eye Contact Fair  Facial Expression Animated  Affect Appropriate to circumstance  Speech Logical/coherent  Interaction Assertive  Motor Activity Slow  Appearance/Hygiene Unremarkable  Behavior Characteristics Cooperative;Appropriate to situation  Mood Pleasant  Thought Process  Coherency Concrete thinking  Content Magical thinking  Delusions None reported or observed  Perception Hallucinations  Hallucination Auditory  Judgment Limited  Confusion None  Danger to Self  Current suicidal ideation? Denies  Danger to Others  Danger to Others None reported or observed

## 2021-01-01 NOTE — [facility] Group Notes (Signed)
[facility] Group Notes:  (Nursing/MHT/Case Management/Adjunct)  Date:  01/01/2021  Time:  1:10 PM  Type of Therapy:   1:1 Orientation/Goals Group  Participation Level:  Active  Participation Quality:  Appropriate  Affect:  Appropriate  Cognitive:  Appropriate  Insight:  Good and Improving  Engagement in Group:  Engaged and Improving  Modes of Intervention:  Orientation  Summary of Progress/Problems: Pt goal for today is to exercise and drink water.   Noah Ramsey [name] 01/01/2021, 1:10 PM

## 2021-01-01 NOTE — [facility] Counselor (Signed)
CSW informed pt's mother of planned discharge for tomorrow.   Ruthann Cancer MSW, LCSW Clincal Social Worker  [facility]

## 2021-01-01 NOTE — BH IP Treatment Plan (Signed)
Interdisciplinary Treatment and Diagnostic Plan Update  01/01/2021 Time of Session: 9:00am Noah Ramsey MRN: [id number]  Principal Diagnosis: Schizoaffective disorder [facility])  Secondary Diagnoses: Principal Problem:   Schizoaffective disorder (HCC)   Current Medications:  Current Facility-Administered Medications  Medication Dose Route Frequency Provider Last Rate Last Admin   acetaminophen (TYLENOL) tablet 650 mg  650 mg Oral Q6H PRN Rankin, Shuvon B, NP   650 mg at 12/14/20 0203   alum & mag hydroxide-simeth (MAALOX/MYLANTA) 200-200-20 MG/5ML suspension 30 mL  30 mL Oral Q4H PRN Rankin, Shuvon B, NP       benztropine (COGENTIN) tablet 0.5 mg  0.5 mg Oral Q6H PRN Claudie Revering, MD       diphenhydrAMINE (BENADRYL) injection 50 mg  50 mg Intramuscular Q6H PRN Claudie Revering, MD       doxepin (SINEQUAN) capsule 10 mg  10 mg Oral QHS Mariel Craft, MD   10 mg at 12/31/20 2014   gabapentin (NEURONTIN) capsule 300 mg  300 mg Oral QHS Claudie Revering, MD   300 mg at 12/31/20 2014   hydrOXYzine (ATARAX/VISTARIL) tablet 25 mg  25 mg Oral Q6H PRN Claudie Revering, MD   25 mg at 12/25/20 2126   LORazepam (ATIVAN) injection 2 mg  2 mg Intramuscular BID PRN Claudie Revering, MD       LORazepam (ATIVAN) tablet 1 mg  1 mg Oral Q6H PRN Claudie Revering, MD   1 mg at 12/27/20 0951   magnesium hydroxide (MILK OF MAGNESIA) suspension 30 mL  30 mL Oral Daily PRN Rankin, Shuvon B, NP       metFORMIN (GLUCOPHAGE) tablet 500 mg  500 mg Oral BID WC Rankin, Shuvon B, NP   500 mg at 01/01/21 0800   ondansetron (ZOFRAN) tablet 4 mg  4 mg Oral Q8H PRN Claudie Revering, MD       [START ON 01/21/2021] paliperidone (INVEGA SUSTENNA) injection 234 mg  234 mg Intramuscular Q28 days Claudie Revering, MD       polyethylene glycol (MIRALAX / GLYCOLAX) packet 17 g  17 g Oral Daily Claudie Revering, MD   17 g at 12/30/20 0741   propranolol (INDERAL) tablet 40 mg  40 mg Oral Q8H Claudie Revering, MD   40 mg at 01/01/21 0631    risperiDONE (RISPERDAL M-TABS) disintegrating tablet 2 mg  2 mg Oral Daily Claudie Revering, MD   2 mg at 01/01/21 0800   risperiDONE (RISPERDAL M-TABS) disintegrating tablet 3 mg  3 mg Oral QHS Claudie Revering, MD   3 mg at 12/31/20 2014   PTA Medications: Medications Prior to Admission  Medication Sig Dispense Refill Last Dose   diazepam (VALIUM) 5 MG tablet Take 5 mg by mouth 2 (two) times daily as needed for anxiety.      haloperidol (HALDOL) 10 MG tablet Take 10 mg by mouth at bedtime. (Patient not taking: No sig reported)      haloperidol decanoate (HALDOL DECANOATE) 100 MG/ML injection Inject 100 mg into the muscle every 28 (twenty-eight) days. (Patient not taking: No sig reported)      LATUDA 40 MG TABS tablet Take 40 mg by mouth at bedtime.      metFORMIN (GLUCOPHAGE) 500 MG tablet Take 1 tablet (500 mg total) by mouth 2 (two) times daily with a meal. (Patient not taking: No sig reported) 180 tablet 3    OVER THE COUNTER MEDICATION Ripped Fat Burner (diet pill) (  Patient not taking: Reported on 12/06/2020)   Not Taking   OVER THE COUNTER MEDICATION Yohimbine (diet pill) (Patient not taking: Reported on 12/06/2020)   Not Taking   propranolol (INDERAL) 40 MG tablet Take 40 mg by mouth 3 (three) times daily.       Patient Stressors: Financial difficulties Medication change or noncompliance  Patient Strengths: Manufacturing systems engineer Supportive family/friends  Treatment Modalities: Medication Management, Group therapy, Case management,  1 to 1 session with clinician, Psychoeducation, Recreational therapy.   Physician Treatment Plan for Primary Diagnosis: Schizoaffective disorder (HCC) Long Term Goal(s): Improvement in symptoms so as ready for discharge   Short Term Goals: Ability to identify changes in lifestyle to reduce recurrence of condition will improve Ability to verbalize feelings will improve Ability to disclose and discuss suicidal ideas Ability to demonstrate self-control  will improve Ability to identify and develop effective coping behaviors will improve Ability to maintain clinical measurements within normal limits will improve Compliance with prescribed medications will improve  Medication Management: Evaluate patient's response, side effects, and tolerance of medication regimen.  Therapeutic Interventions: 1 to 1 sessions, Unit Group sessions and Medication administration.  Evaluation of Outcomes: Progressing  Physician Treatment Plan for Secondary Diagnosis: Principal Problem:   Schizoaffective disorder (HCC)  Long Term Goal(s): Improvement in symptoms so as ready for discharge   Short Term Goals: Ability to identify changes in lifestyle to reduce recurrence of condition will improve Ability to verbalize feelings will improve Ability to disclose and discuss suicidal ideas Ability to demonstrate self-control will improve Ability to identify and develop effective coping behaviors will improve Ability to maintain clinical measurements within normal limits will improve Compliance with prescribed medications will improve     Medication Management: Evaluate patient's response, side effects, and tolerance of medication regimen.  Therapeutic Interventions: 1 to 1 sessions, Unit Group sessions and Medication administration.  Evaluation of Outcomes: Progressing   RN Treatment Plan for Primary Diagnosis: Schizoaffective disorder (HCC) Long Term Goal(s): Knowledge of disease and therapeutic regimen to maintain health will improve  Short Term Goals: Ability to remain free from injury will improve, Ability to verbalize frustration and anger appropriately will improve, and Ability to demonstrate self-control  Medication Management: RN will administer medications as ordered by provider, will assess and evaluate patient's response and provide education to patient for prescribed medication. RN will report any adverse and/or side effects to prescribing  provider.  Therapeutic Interventions: 1 on 1 counseling sessions, Psychoeducation, Medication administration, Evaluate responses to treatment, Monitor vital signs and CBGs as ordered, Perform/monitor CIWA, COWS, AIMS and Fall Risk screenings as ordered, Perform wound care treatments as ordered.  Evaluation of Outcomes: Progressing   LCSW Treatment Plan for Primary Diagnosis: Schizoaffective disorder (HCC) Long Term Goal(s): Safe transition to appropriate next level of care at discharge, Engage patient in therapeutic group addressing interpersonal concerns.  Short Term Goals: Engage patient in aftercare planning with referrals and resources, Increase social support, Increase ability to appropriately verbalize feelings, and Increase skills for wellness and recovery  Therapeutic Interventions: Assess for all discharge needs, 1 to 1 time with Social worker, Explore available resources and support systems, Assess for adequacy in community support network, Educate family and significant other(s) on suicide prevention, Complete Psychosocial Assessment, Interpersonal group therapy.  Evaluation of Outcomes: Progressing   Progress in Treatment: Attending groups: Yes. and No. Participating in groups: Yes. and No. Taking medication as prescribed: Yes. Toleration medication: Yes. Family/Significant other contact made: Yes, individual(s) contacted:  with mother Patient understands  diagnosis: No. Discussing patient identified problems/goals with staff: Yes. Medical problems stabilized or resolved: Yes. Denies suicidal/homicidal ideation: Yes. Issues/concerns per patient self-inventory: No.     New problem(s) identified: No, Describe:  None    New Short Term/Long Term Goal(s): medication stabilization, elimination of SI thoughts, development of comprehensive mental wellness plan.    Patient Goals: Did not attend    Discharge Plan or Barriers: Pt will return to live with his mother and will  attend [facility] for therapy and [facility] for medication management.  The Pt has also been referred to an ACTT team, CST Team, and PSR Team at 3 other [facility] mental health facilities that will allow him a wide range of community services.    Reason for Continuation of Hospitalization: Delusions  Medical Issues Medication stabilization  Estimated Length of Stay: 1 days   Scribe for Treatment Team: Otelia Santee, LCSW 01/01/2021 9:40 AM

## 2021-01-01 NOTE — Progress Notes (Signed)
Pt was given a packet to do for psycho-ed group 

## 2021-01-01 NOTE — Plan of Care (Signed)
  Problem: Activity: Goal: Will verbalize the importance of balancing activity with adequate rest periods Outcome: Progressing   Problem: Self-Concept: Goal: Ability to identify factors that promote anxiety will improve Outcome: Progressing Goal: Ability to modify response to factors that promote anxiety will improve Outcome: Progressing

## 2021-01-01 NOTE — Progress Notes (Signed)
[facility] MD Progress Note  01/01/2021 2:31 PM Noah Ramsey  MRN:  [id number]  Reason for admission: Patient is a 25 year old male with history of schizoaffective disorder admitted for insomnia x 5 days, aggressive behavior, delusions, suicidal ideation and auditory hallucinations.  Objective: Patient was seen and evaluated, chart reviewed and case discussed with the treatment team.  Patient was up in his room, attending to his hygiene needs. Patient's sleep was not recorded last night. Patient stated he slept 8-10 hours. Patient denies SI/HI/AVH, paranoia and delusions. He does not appear to be responding to internal stimuli during the evaluation. He is pleasant on approach. Patient appears stable.  He is denying any AVH today.  He reported a good appetite. He denies somatic complaints.  He denies URI or other COVID symptoms. He has been medication compliant. He has not been requiring PRN medication. He has not required any PRN medications in the past 24 hours. We will continue to seek out placement options for patient.    Principal Problem: Schizoaffective disorder (HCC) Diagnosis: Principal Problem:   Schizoaffective disorder (HCC)  Total Time Spent in Direct Patient Care: 15 minutes  Past Psychiatric History: See admission H&P  Past Medical History:  Past Medical History:  Diagnosis Date   Elevated CPK    per patient   Schizophrenia (HCC)    History reviewed. No pertinent surgical history. Family History:  Family History  Problem Relation Age of Onset   Mental illness Brother    Family Psychiatric  History: See admission H&P Social History:  Social History   Substance and Sexual Activity  Alcohol Use Never     Social History   Substance and Sexual Activity  Drug Use Never    Social History   Socioeconomic History   Marital status: Single    Spouse name: Not on file   Number of children: Not on file   Years of education: Not on file   Highest education level: Not on  file  Occupational History   Not on file  Tobacco Use   Smoking status: Never   Smokeless tobacco: Never  Substance and Sexual Activity   Alcohol use: Never   Drug use: Never   Sexual activity: Not on file  Other Topics Concern   Not on file  Social History Narrative   Not on file   Social Determinants of Health   Financial Resource Strain: Not on file  Food Insecurity: Not on file  Transportation Needs: Not on file  Physical Activity: Not on file  Stress: Not on file  Social Connections: Not on file   Additional Social History:    Sleep: Patient's sleep not recorded last night.    Appetite:  Good  Current Medications: Current Facility-Administered Medications  Medication Dose Route Frequency Provider Last Rate Last Admin   acetaminophen (TYLENOL) tablet 650 mg  650 mg Oral Q6H PRN Rankin, Shuvon B, NP   650 mg at 12/14/20 0203   alum & mag hydroxide-simeth (MAALOX/MYLANTA) 200-200-20 MG/5ML suspension 30 mL  30 mL Oral Q4H PRN Rankin, Shuvon B, NP       benztropine (COGENTIN) tablet 0.5 mg  0.5 mg Oral Q6H PRN Claudie Revering, MD       diphenhydrAMINE (BENADRYL) injection 50 mg  50 mg Intramuscular Q6H PRN Claudie Revering, MD       doxepin (SINEQUAN) capsule 10 mg  10 mg Oral QHS Mariel Craft, MD   10 mg at 12/31/20 2014   gabapentin (NEURONTIN) capsule  300 mg  300 mg Oral QHS Claudie Revering, MD   300 mg at 12/31/20 2014   hydrOXYzine (ATARAX/VISTARIL) tablet 25 mg  25 mg Oral Q6H PRN Claudie Revering, MD   25 mg at 12/25/20 2126   LORazepam (ATIVAN) injection 2 mg  2 mg Intramuscular BID PRN Claudie Revering, MD       LORazepam (ATIVAN) tablet 1 mg  1 mg Oral Q6H PRN Claudie Revering, MD   1 mg at 12/27/20 0951   magnesium hydroxide (MILK OF MAGNESIA) suspension 30 mL  30 mL Oral Daily PRN Rankin, Shuvon B, NP       metFORMIN (GLUCOPHAGE) tablet 500 mg  500 mg Oral BID WC Rankin, Shuvon B, NP   500 mg at 01/01/21 0800   ondansetron (ZOFRAN) tablet 4 mg  4 mg Oral Q8H  PRN Claudie Revering, MD       [START ON 01/21/2021] paliperidone (INVEGA SUSTENNA) injection 234 mg  234 mg Intramuscular Q28 days Claudie Revering, MD       polyethylene glycol (MIRALAX / GLYCOLAX) packet 17 g  17 g Oral Daily Claudie Revering, MD   17 g at 12/30/20 0741   propranolol (INDERAL) tablet 40 mg  40 mg Oral Q8H Claudie Revering, MD   40 mg at 01/01/21 0631   risperiDONE (RISPERDAL M-TABS) disintegrating tablet 2 mg  2 mg Oral Daily Claudie Revering, MD   2 mg at 01/01/21 0800   risperiDONE (RISPERDAL M-TABS) disintegrating tablet 3 mg  3 mg Oral QHS Claudie Revering, MD   3 mg at 12/31/20 2014    Lab Results:  No results found for this or any previous visit (from the past 48 hour(s)).     Blood Alcohol level:  Lab Results  Component Value Date   ETH <10 11/29/2020   ETH <10 11/07/2020    Metabolic Disorder Labs: Lab Results  Component Value Date   HGBA1C 5.5 12/04/2020   MPG 111.15 12/04/2020   No results found for: PROLACTIN Lab Results  Component Value Date   CHOL 123 12/02/2020   TRIG 74 12/02/2020   HDL 35 (L) 12/02/2020   CHOLHDL 3.5 12/02/2020   VLDL 15 12/02/2020   LDLCALC 73 12/02/2020   LDLCALC 101 (H) 06/03/2019    Physical Findings: AIMS: Facial and Oral Movements Muscles of Facial Expression: None, normal Lips and Perioral Area: None, normal Jaw: None, normal Tongue: None, normal,Extremity Movements Upper (arms, wrists, hands, fingers): None, normal Lower (legs, knees, ankles, toes): None, normal, Trunk Movements Neck, shoulders, hips: None, normal, Overall Severity Severity of abnormal movements (highest score from questions above): None, normal Incapacitation due to abnormal movements: None, normal Patient's awareness of abnormal movements (rate only patient's report): No Awareness, Dental Status Current problems with teeth and/or dentures?: No Does patient usually wear dentures?: No  CIWA:    COWS:     Musculoskeletal: Strength & Muscle  Tone: within normal limits Gait & Station: normal Patient leans: N/A  Psychiatric Specialty Exam:  Presentation  General Appearance: Appropriate for Environment; Casual  Eye Contact:Good  Speech:Clear and Coherent  Speech Volume:Normal  Handedness:Right   Mood and Affect  Mood:Euthymic  Affect:Constricted; Flat   Thought Process  Thought Processes:Goal Directed  Descriptions of Associations:Tangential  Orientation:Full (Time, Place and Person)  Thought Content:Paranoid Ideation  History of Schizophrenia/Schizoaffective disorder:Yes  Duration of Psychotic Symptoms:Greater than six months  Hallucinations:Hallucinations: Other (comment) (denies today) Description of Auditory Hallucinations: None  mentioned today Description of Visual Hallucinations: None mentioned today  Ideas of Reference:Other (comment) (denies paranoia and delusions today)  Suicidal Thoughts:Suicidal Thoughts: No  Homicidal Thoughts:Homicidal Thoughts: No   Sensorium  Memory:Immediate Fair; Recent Fair  Judgment:Impaired  Insight:Shallow   Executive Functions  Concentration:Fair  Attention Span:Fair  Recall:Fair  Fund of Knowledge:Fair  Language:Good   Psychomotor Activity  Psychomotor Activity:Psychomotor Activity: Normal   Assets  Assets:Communication Skills; Desire for Improvement; Social Support   Sleep  Sleep:Sleep: Good (Not recorded last night)    Physical Exam: Physical Exam Vitals and nursing note reviewed. Exam conducted with a chaperone present.  Constitutional:      General: He is not in acute distress.    Appearance: He is obese. He is not diaphoretic.  HENT:     Head: Normocephalic and atraumatic.  Cardiovascular:     Rate and Rhythm: Normal rate.  Pulmonary:     Effort: Pulmonary effort is normal. No respiratory distress.  Musculoskeletal:        General: Normal range of motion.  Neurological:     General: No focal deficit present.      Mental Status: He is alert and oriented to person, place, and time.   Review of Systems  Constitutional:  Negative for chills, diaphoresis, fever and malaise/fatigue.  HENT:  Negative for congestion and sore throat.   Respiratory:  Negative for cough, sputum production and shortness of breath.   Cardiovascular:  Negative for chest pain and palpitations.  Gastrointestinal:  Negative for constipation, diarrhea, nausea and vomiting.  Musculoskeletal:  Positive for joint pain. Negative for myalgias.       Positive for chronic left knee pain.  Neurological:  Negative for dizziness, tremors, seizures and headaches.  Psychiatric/Behavioral:  Negative for depression, hallucinations, substance abuse and suicidal ideas. The patient is not nervous/anxious and does not have insomnia.        Previously had reversed sleep-wake cycle; patient states he prefers to sleep during the day.  Overnight has been improving past 2 nights with assurance of medication compliance.   All other systems reviewed and are negative.  Blood pressure (!) 147/105, pulse 90, temperature 97.7 F (36.5 C), temperature source Oral, resp. rate 16, height 5\' 6"  (1.676 m), weight 134.3 kg, SpO2 100 %. Body mass index is 47.78 kg/m.   Treatment Plan Summary: Patient is a 25 year old male with schizoaffective disorder, bipolar type admitted for treatment and stabilization of worsening psychotic and mood symptoms, suicidal ideation and aggressive behavior.  Patient is displaying improvement in organization of thought processes and is less exclusively focused on delusional themes.  Patient has tolerated the first 2 loading doses of Invega Sustenna LAI.  Patient insisted on receiving first two 25 LAI loading doses via gluteal rather than deltoid injection.  Patient continues on oral Risperdal. He has been trying to refuse medications or vomit after taking medications.  Staff has been requested to perform consistent mouth checks  after medication administration to ensure that patient is not cheeking his medications.  Staff are now having patient sit in the day room for 30 minutes after medication administration to prevent patient from vomiting meds.  Since patient has been monitored for medication compliance, symptoms have improved and he has reversed his sleep back to night time consolidated sleep. Plan is to continue [place] LAI and oral Risperdal for now.  Patient may need to remain on oral antipsychotic agent in addition to [place] LAI long-term to adequately address psychotic symptoms.  Daily contact with patient to assess and evaluate symptoms and progress in treatment and Medication management  Schizoaffective disorder -Continue Risperdal M tabs 2 mg PO QAM and 3 mg PO QHS for psychotic symptoms for now.   -Continue Invega Sustenna LAI q. 28 days.  First loading dose of 234 mg IM administered 12/20/2020.  Second loading dose of 156 mg IM was administered on 12/24/2020.  Plan is for maintenance dosing of Invega Sustenna LAI 234 mg IM every 28 days with next dose due approximately 01/21/2021. -Continue Trileptal to 150 mg twice daily with plan for eventual discontinuation prior to discharge.       Anxiety/EPS -Continue propranolol 40 mg Q8H for restlessness and anxiety -Continue benztropine 0.5 mg PO Q6H PRN for tremors, muscle stiffness -Continue diphenhydramine 50 mg IM every 6 hours as needed for acute dystonic reaction  Diabetes Mellitus -Continue metformin 500 mg twice daily with meals  Agitation  -Continue lorazepam 1 mg PO Q6H PRN anxiety, restlessness, agitation -Continue lorazepam 2 mg IM BID PRN for severe agitation only if patient unable to take oral PRN medications for agitation  Anxiety -Continue hydroxyzine 25 mg Q6H PRN  Insomnia -Continue gabapentin 300 mg at bedtime for RLS, sleep -Continue doxepin 10 mg at bedtime as needed for sleep.  Constipation -Continue MiraLAX 17 g  daily -Continue MOM 30 mL daily PRN -Encourage intake of p.o. fluids  Positive COVID test on 12/27/2020 -Continue airborne and contact precautions -Patient remains afebrile and is denying symptoms -Patient denies COVID symptoms;cough, sore throat, congestion, sinus pain  Continue every 15 minute safety checks Discharge planning in progress.  Social work has encouraged family to petition for guardian to be appointed for patient.  Housing plan needs to be clarified.  Laveda AbbeLaurie Britton Efton Thomley, NP 01/01/2021, 2:31 PM

## 2021-01-01 NOTE — Group Note (Signed)
Recreation Therapy Group Note   Group Topic:Health and Wellness  Group Date: 01/01/2021 Start Time: 1100 End Time:  Facilitators: Caroll Rancher, LRT/CTRS Location: [address] Dayroom  Goal Area(s) Addresses:  Patient will define components of whole wellness. Patient will verbalize benefit of whole wellness.  Group Description:  Patients were to discuss the importance of wellness and how it affects daily life.  Patients were to also write out what it takes for them to relax and become at peace with themselves.   Affect/Mood: N/A   Participation Level: Did not attend    Clinical Observations/Individualized Feedback: Due to COVID issues on the unit, group was not held at it's regular time.  LRT left packets for patients that them to explore what peace is to them and what steps they take to find peace.  The packet also explored the benefits of jounaling  and encouraged patients to take a few moments to  journal.     Plan: Continue to engage patient in RT group sessions 2-3x/week.   Caroll Rancher, LRT/CTRS  01/01/2021 12:48 PM

## 2021-01-01 NOTE — Progress Notes (Signed)
Pt was given packet to review for therapeutic relaxation group. 

## 2021-01-02 NOTE — Progress Notes (Signed)
[facility] MD Progress Note  01/02/2021 5:56 PM Noah Ramsey  MRN:  [id number]  Reason for admission: Patient is a 25 year old male with history of schizoaffective disorder admitted for insomnia x 5 days, aggressive behavior, delusions, suicidal ideation and auditory hallucinations.  Objective: Medical record reviewed.  Patient's case discussed in detail with members of the treatment team.  I met with and evaluated the patient on the unit today for follow-up today.  Patient looks better today than he did at the end of last week.  He maintains good eye contact and thought processes are more organized and coherent.  Patient makes fewer grandiose or other delusional statements today.  He does not speak of being a demon or the [place] during our conversation.  Patient continues to make illogical statements about his thoughts on medical treatment for different medical conditions.  He states that he spoke with his mother on the phone earlier today and the conversation went well.  He would like to return home to live with her.  Patient denies SI, HI, AI, PI, AH or VH.  He denies any physical issues other than chronic left knee pain.  Patient denies medication side effects.  He continues to perseverate on concerns about weight loss and exercise and his perception that antipsychotics are bad.  He is fully oriented to month, year, city, self, situation.  Patient reports that he is eating and sleeping well.  Staff document that he slept 5.25 hours last night.  Vital signs today include BP 117/69; pulse 97; respirations 16; O2 sat 99% on room air and temperature 97.9.  No new labs today.  Patient is taking scheduled medications as prescribed except that he refused his afternoon dose of propranolol today.  He has not received any PRN medications in more than the last 4 days.  Staff document that patient continues to be observed to respond to internal stimuli at times.  He has not displayed any aggressive behavior or  self-injurious behavior on the unit in the last 24 hours.  Staff report that patient made phone call to his mother today which reportedly went well.  Principal Problem: Schizoaffective disorder ([place]) Diagnosis: Principal Problem:   Schizoaffective disorder ([place])  Total Time spent with patient:  20 minutes  Past Psychiatric History: See admission H&P  Past Medical History:  Past Medical History:  Diagnosis Date   Elevated CPK    per patient   Schizophrenia ([place])    History reviewed. No pertinent surgical history. Family History:  Family History  Problem Relation Age of Onset   Mental illness Brother    Family Psychiatric  History: See admission H&P Social History:  Social History   Substance and Sexual Activity  Alcohol Use Never     Social History   Substance and Sexual Activity  Drug Use Never    Social History   Socioeconomic History   Marital status: Single    Spouse name: Not on file   Number of children: Not on file   Years of education: Not on file   Highest education level: Not on file  Occupational History   Not on file  Tobacco Use   Smoking status: Never   Smokeless tobacco: Never  Substance and Sexual Activity   Alcohol use: Never   Drug use: Never   Sexual activity: Not on file  Other Topics Concern   Not on file  Social History Narrative   Not on file   Social Determinants of Health   Financial Resource Strain:  Not on file  Food Insecurity: Not on file  Transportation Needs: Not on file  Physical Activity: Not on file  Stress: Not on file  Social Connections: Not on file   Additional Social History:                         Sleep: Good  Appetite:  Good  Current Medications: Current Facility-Administered Medications  Medication Dose Route Frequency Provider Last Rate Last Admin   acetaminophen (TYLENOL) tablet 650 mg  650 mg Oral Q6H PRN Rankin, Shuvon B, NP   650 mg at 12/14/20 0203   alum & mag hydroxide-simeth  (MAALOX/MYLANTA) 200-200-20 MG/5ML suspension 30 mL  30 mL Oral Q4H PRN Rankin, Shuvon B, NP       benztropine (COGENTIN) tablet 0.5 mg  0.5 mg Oral Q6H PRN Arthor Captain, MD       diphenhydrAMINE (BENADRYL) injection 50 mg  50 mg Intramuscular Q6H PRN Arthor Captain, MD       doxepin (SINEQUAN) capsule 10 mg  10 mg Oral QHS Lavella Hammock, MD   10 mg at 01/01/21 2105   gabapentin (NEURONTIN) capsule 300 mg  300 mg Oral QHS Arthor Captain, MD   300 mg at 01/01/21 2105   hydrOXYzine (ATARAX/VISTARIL) tablet 25 mg  25 mg Oral Q6H PRN Arthor Captain, MD   25 mg at 12/25/20 2126   LORazepam (ATIVAN) injection 2 mg  2 mg Intramuscular BID PRN Arthor Captain, MD       LORazepam (ATIVAN) tablet 1 mg  1 mg Oral Q6H PRN Arthor Captain, MD   1 mg at 12/27/20 0951   magnesium hydroxide (MILK OF MAGNESIA) suspension 30 mL  30 mL Oral Daily PRN Rankin, Shuvon B, NP       metFORMIN (GLUCOPHAGE) tablet 500 mg  500 mg Oral BID WC Rankin, Shuvon B, NP   500 mg at 01/02/21 0804   ondansetron (ZOFRAN) tablet 4 mg  4 mg Oral Q8H PRN Arthor Captain, MD       [START ON 01/21/2021] paliperidone (INVEGA SUSTENNA) injection 234 mg  234 mg Intramuscular Q28 days Arthor Captain, MD       polyethylene glycol (MIRALAX / GLYCOLAX) packet 17 g  17 g Oral Daily Arthor Captain, MD   17 g at 01/02/21 0804   propranolol (INDERAL) tablet 40 mg  40 mg Oral Q8H Arthor Captain, MD   40 mg at 01/02/21 0631   risperiDONE (RISPERDAL M-TABS) disintegrating tablet 2 mg  2 mg Oral Daily Arthor Captain, MD   2 mg at 01/02/21 0804   risperiDONE (RISPERDAL M-TABS) disintegrating tablet 3 mg  3 mg Oral QHS Arthor Captain, MD   3 mg at 01/01/21 2104    Lab Results:  No results found for this or any previous visit (from the past 79 hour(s)).     Blood Alcohol level:  Lab Results  Component Value Date   ETH <10 11/29/2020   ETH <10 79/05/4095    Metabolic Disorder Labs: Lab Results  Component Value Date   HGBA1C 5.5  12/04/2020   MPG 111.15 12/04/2020   No results found for: PROLACTIN Lab Results  Component Value Date   CHOL 123 12/02/2020   TRIG 74 12/02/2020   HDL 35 (L) 12/02/2020   CHOLHDL 3.5 12/02/2020   VLDL 15 12/02/2020   LDLCALC 73 12/02/2020   LDLCALC 101 (H)  06/03/2019    Physical Findings: AIMS: Facial and Oral Movements Muscles of Facial Expression: None, normal Lips and Perioral Area: None, normal Jaw: None, normal Tongue: None, normal,Extremity Movements Upper (arms, wrists, hands, fingers): None, normal Lower (legs, knees, ankles, toes): None, normal, Trunk Movements Neck, shoulders, hips: None, normal, Overall Severity Severity of abnormal movements (highest score from questions above): None, normal Incapacitation due to abnormal movements: None, normal Patient's awareness of abnormal movements (rate only patient's report): No Awareness, Dental Status Current problems with teeth and/or dentures?: No Does patient usually wear dentures?: No  CIWA:    COWS:     Musculoskeletal: Strength & Muscle Tone: within normal limits Gait & Station: normal Patient leans: N/A  Psychiatric Specialty Exam:  Presentation  General Appearance: Appropriate for Environment; Fairly Groomed  Eye Contact:Good  Speech:Clear and Coherent  Speech Volume:Normal  Handedness:Right   Mood and Affect  Mood:Euthymic  Affect:Constricted   Thought Process  Thought Processes:Coherent  Descriptions of Associations:Tangential  Orientation:Full (Time, Place and Person)  Thought Content:Paranoid Ideation  History of Schizophrenia/Schizoaffective disorder:Yes  Duration of Psychotic Symptoms:Greater than six months  Hallucinations:Hallucinations: Other (comment) (Denies hallucinations today) Description of Auditory Hallucinations: denies today  Ideas of Reference:Other (comment) (Denies paranoia today)  Suicidal Thoughts:Suicidal Thoughts: No  Homicidal Thoughts:Homicidal  Thoughts: No   Sensorium  Memory:Immediate Fair; Recent Fair  Judgment:Impaired  Insight:Shallow   Executive Functions  Concentration:Fair  Attention Span:Fair  [place]  Language:Good   Psychomotor Activity  Psychomotor Activity:Psychomotor Activity: Normal   Assets  Assets:Communication Skills; Desire for Improvement; Housing; Social Support   Sleep  Sleep:Sleep: Good Number of Hours of Sleep: 5.25    Physical Exam: Physical Exam Vitals and nursing note reviewed.  Constitutional:      General: He is not in acute distress.    Appearance: He is not diaphoretic.  HENT:     Head: Normocephalic and atraumatic.  Cardiovascular:     Rate and Rhythm: Normal rate.  Pulmonary:     Effort: Pulmonary effort is normal.  Neurological:     General: No focal deficit present.     Mental Status: He is alert and oriented to person, place, and time.   Review of Systems  Constitutional:  Negative for chills, diaphoresis and fever.  HENT:  Negative for sore throat.   Respiratory:  Negative for cough and shortness of breath.   Cardiovascular:  Negative for chest pain and palpitations.  Gastrointestinal:  Negative for constipation, diarrhea, nausea and vomiting.  Musculoskeletal:  Positive for joint pain. Negative for myalgias.       Positive for chronic left knee pain.  Neurological:  Negative for dizziness, tremors, seizures and headaches.  Psychiatric/Behavioral:  Negative for depression, hallucinations, substance abuse and suicidal ideas. The patient is not nervous/anxious and does not have insomnia.   All other systems reviewed and are negative. Blood pressure 117/69, pulse 97, temperature 97.9 F (36.6 C), temperature source Oral, resp. rate 16, height _0  (1.676 m), weight 134.3 kg, SpO2 99 %. Body mass index is 47.78 kg/m.   Treatment Plan Summary: Patient is a 25 year old male with schizoaffective disorder, bipolar type admitted for  treatment and stabilization of worsening psychotic and mood symptoms, suicidal ideation and aggressive behavior.  Patient is displaying some improvement in organization of thought processes and is less exclusively focused on delusional themes.  Patient has tolerated the first 2 loading doses of Invega Sustenna LAI.  Patient insisted on receiving first two [place]  loading doses via gluteal rather than deltoid injection.  Patient is improving on current medications with more organized thought processes and decreased delusional statements.  Some residual psychotic symptoms persist.  Patient appears to be at baseline and is ready for discharge when family can accept him.  Plan is to continue [place] LAI and oral Risperdal for now.  Patient may need to remain on oral antipsychotic agent in addition to [place] long-term to adequately address psychotic symptoms.  Daily contact with patient to assess and evaluate symptoms and progress in treatment and Medication management  Schizoaffective disorder -Continue Risperdal M tabs 2 mg PO QAM and 3 mg PO QHS for psychotic symptoms for now.   -Continue Invega Sustenna LAI q. 28 days.  First loading dose of 234 mg IM administered 12/20/2020.  Second loading dose of 156 mg IM was administered on 12/24/2020.  Plan is for maintenance dosing of Invega Sustenna LAI 234 mg IM every 28 days with next dose due approximately 01/21/2021.      Anxiety/EPS -Continue propranolol 40 mg Q8H for restlessness and anxiety -Continue benztropine 0.5 mg PO Q6H PRN for tremors, muscle stiffness -Continue diphenhydramine 50 mg IM every 6 hours as needed for acute dystonic reaction  Diabetes Mellitus -Continue metformin 500 mg twice daily with meals  Agitation  -Continue lorazepam 1 mg PO Q6H PRN anxiety, restlessness, agitation -Continue lorazepam 2 mg IM BID PRN for severe agitation only if patient unable to take oral PRN medications for  agitation  Anxiety -Continue hydroxyzine 25 mg Q6H PRN  Insomnia -Continue gabapentin 300 mg at bedtime for RLS, sleep -Continue doxepin 10 mg at bedtime   Constipation -Continue MiraLAX 17 g daily -Continue MOM 30 mL daily PRN -Encourage intake of p.o. fluids  Positive COVID test on 12/27/2020 -Continue airborne and contact precautions -Patient remains afebrile and is denying symptoms  Discharge planning in progress.    Arthor Captain, MD 01/02/2021, 5:56 PM

## 2021-01-02 NOTE — Progress Notes (Signed)
Pt didn't attend/participate on 1:1 psycho-ed group. 

## 2021-01-02 NOTE — Plan of Care (Signed)
I made direct contact with patient's mother by phone today and we spoke for approximately 30 minutes.  We discussed patient's current symptoms, medication regimen, possible medication side effects, other treatment, anticipated treatment outcomes, outpatient follow-up, outpatient resources, aftercare plans, etc.  Patient's mother was provided opportunity to ask questions and verbalize concerns. Mother's questions were answered.  Mother stated understanding of the information discussed.  She expressed desire for patient to discharge on Friday, 01/05/2021 given his COVID-positive test on 12/27/2020 due to health concerns of members of the household.  Patient's mother expressed appreciation for the phone call.

## 2021-01-02 NOTE — [facility] Group Notes (Signed)
[facility] Group Notes:  (Nursing/MHT/Case Management/Adjunct)  Date:  01/02/2021  Time:  11:28 AM  Type of Therapy:   1:1 Orientation/Goals Group  Participation Level:  Active  Participation Quality:  Appropriate  Affect:  Appropriate  Cognitive:  Appropriate  Insight:  Appropriate  Engagement in Group:  Engaged and Improving  Modes of Intervention:  Orientation  Summary of Progress/Problems: Pt goal for today is to drink water and to find out when he gets discharged.  Mann Skag[place] J Franky Reier 01/02/2021, 11:28 AM

## 2021-01-03 NOTE — [facility] Group Notes (Addendum)
Adult Psychoeducational Group Note  Date:  01/03/2021 Time:  4:09 PM  Group Topic/Focus:  Personal Choices and Values:   The focus of this group is to help patients assess and explore the importance of values in their lives, how their values affect their decisions, how they express their values and what opposes their expression.  Additional Comments:  Pt's were distributed personal development packets. Pt's were encouraged to fill it out and speak to the RN or MHT about it once completed.   Deforest Hoyles Kipp Shank 01/03/2021, 4:09 PM

## 2021-01-03 NOTE — Progress Notes (Signed)
      01/03/21 0000  Psych Admission Type (Psych Patients Only)  Admission Status Involuntary  Psychosocial Assessment  Patient Complaints None  Eye Contact Fair  Facial Expression Animated  Affect Appropriate to circumstance  Speech Logical/coherent  Interaction Assertive  Motor Activity Slow  Appearance/Hygiene Unremarkable  Behavior Characteristics Cooperative  Mood Preoccupied  Thought Process  Coherency Concrete thinking  Content Magical thinking  Delusions None reported or observed  Perception Hallucinations  Hallucination Auditory  Judgment Limited  Confusion None  Danger to Self  Current suicidal ideation? Denies  Danger to Others  Danger to Others None reported or observed

## 2021-01-03 NOTE — Progress Notes (Signed)
Pt refused am Bp medication
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
## 2021-01-03 NOTE — Progress Notes (Addendum)
Pt attended and participated individualized group sessions in milieu. Awake in bed at this time. Denies SI, HI, AVH and pain. Observed to be concrete, with logical speech on interactions. However, pt remains suspicious of others and paranoid about care. Pt took his scheduled Inderal at medication window, walked away to his room and attempted to put med in commode after Clinical research associate called him multiple times. Per pt "I don't trust you and these medications you are giving, I don't want it. You know I got dementia" despite verbal education. Pt tolerated all meals and fluids well. Denies active signs / symptoms of Covid-19. Vitals done, pt afebrile.  Support offered to pt. Writer encouraged pt to attend to his ADLs, supplies given. Q 15 minutes safety checks effective without self harm gestures or outburst to note thus far.

## 2021-01-03 NOTE — [facility] Group Notes (Signed)
Pt. Didn't attend group. 

## 2021-01-03 NOTE — Group Note (Signed)
Recreation Therapy Group Note   Group Topic:Other  Group Date: 01/03/2021 Start Time: 1019 End Time: 1032 Facilitators: Caroll Rancher, LRT/CTRS Location: [address] Dayroom  Goal Area(s) Addresses:  Patient will identify the benefits of mindfulness. Patient will identify why mindfulness is important.  Group Description:  Mindfulness.  Patients were to identify the importance of being in the moment when participating in activities.  Patients also identified positive thoughts they experience when they are engaged in activities.    Affect/Mood: Appropriate   Participation Level: Engaged   Participation Quality: Independent   Behavior: N/A   Speech/Thought Process: N/A   Insight: Moderate   Judgement: Moderate   Modes of Intervention: Worksheet and 1:1   Patient Response to Interventions:  Engaged   Education Outcome:  N/A   Clinical Observations/Individualized Feedback: LRT complete 1:1 activity with pt.  Pt identified current positive thoughts as comedy, workout and tv.  Pt identified negative thoughts thrusts, deception and lies.  Pt expressed engaging in creative sessions is an activity pt can engage in and be completely mindful.  Pt aspects of the activity pt focuses on are working out, communication, Doctor, hospital and supernatural tv shows.   Plan: Continue to engage patient in RT group sessions 2-3x/week.   Caroll Rancher, LRT/CTRS 01/03/2021 12:27 PM

## 2021-01-03 NOTE — [facility] Group Notes (Signed)
Adult Psychoeducational Group Note  Date:  01/03/2021 Time:  8:35 AM  Group Topic/Focus:  Goals Group:   The focus of this group is to help patients establish daily goals to achieve during treatment and discuss how the patient can incorporate goal setting into their daily lives to aide in recovery.  Participation Level:  Active  Modes of Intervention:  Activity  Additional Comments: Pt is focused on discharge today and had no goal to share.   Noah Ramsey 01/03/2021, 8:35 AM

## 2021-01-03 NOTE — Group Note (Signed)
[facility] LCSW Group Therapy Note   Group Date: 01/03/2021 Start Time: 1300 End Time: 1400  Type of Therapy/Topic:  Group Therapy:  Feelings about Diagnosis  Participation Level:  Active    Description of Group:    This group will allow patients to explore their thoughts and feelings about diagnoses they have received. Patients will be guided to explore their level of understanding and acceptance of these diagnoses. Facilitator will encourage patients to process their thoughts and feelings about the reactions of others to their diagnosis, and will guide patients in identifying ways to discuss their diagnosis with significant others in their lives. This group will be process-oriented, with patients participating in exploration of their own experiences as well as giving and receiving support and challenge from other group members.   Therapeutic Goals: 1. Patient will demonstrate understanding of diagnosis as evidence by identifying two or more symptoms of the disorder:  2. Patient will be able to express two feelings regarding the diagnosis 3. Patient will demonstrate ability to communicate their needs through discussion and/or role plays  Summary of Patient Progress: Pt attended 1 on 1 session with CSW. Pt continues to endorse having dementia. Pt was able to discuss positives of his illness and concerns with his paranoia.     Ruthann Cancer MSW, LCSW Clincal Social Worker  [facility]      Therapeutic Modalities:   Cognitive Behavioral Therapy Brief Therapy Feelings Identification    Otelia Santee, LCSW

## 2021-01-04 LAB — BASIC METABOLIC PANEL
Anion gap: 9 (ref 5–15)
BUN: 16 mg/dL (ref 6–20)
CO2: 27 mmol/L (ref 22–32)
Calcium: 9.5 mg/dL (ref 8.9–10.3)
Chloride: 102 mmol/L (ref 98–111)
Creatinine, Ser: 0.98 mg/dL (ref 0.61–1.24)
GFR, Estimated: >60 mL/min (ref >60)
Glucose, Bld: 107 mg/dL — ABNORMAL HIGH (ref 70–99)
Potassium: 3.8 mmol/L (ref 3.5–5.1)
Sodium: 138 mmol/L (ref 135–145)
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
## 2021-01-04 NOTE — [facility] Group Notes (Signed)
[facility] Group Notes:  (Nursing/MHT/Case Management/Adjunct)  Date:  01/04/2021  Time:  9:44 AM  Type of Therapy:  Nurse Education  Participation Level:  Active  Participation Quality:  Appropriate and Attentive  Affect:  Appropriate  Cognitive:  Alert and Appropriate  Insight:  Appropriate and Improving  Engagement in Group:  Developing/Improving and Improving  Modes of Intervention:  Discussion, Education, and Orientation  Summary of Progress/Problems: Orientation group was to discuss the pt's goal for the day. Pt stated their goal was to plan for discharge. Pt stated they would achieve this by talking with MD and social worker. Pt showed insight into planning but still displayed magical thinking when discussing certain aspects of their treatment plan.   Suszanne Conners Quasean Frye 01/04/2021, 9:44 AM

## 2021-01-04 NOTE — Plan of Care (Signed)
  Problem: Self-Concept: Goal: Ability to identify factors that promote anxiety will improve Outcome: Progressing Goal: Level of anxiety will decrease Outcome: Progressing   

## 2021-01-04 NOTE — [facility] Group Notes (Signed)
[facility] Group Notes:  (Nursing/MHT/Case Management/Adjunct)  Date:  01/04/2021  Time:  4:59 PM  Type of Therapy:  Nurse Education  Participation Level:  Active  Participation Quality:  Appropriate  Affect:  Appropriate  Cognitive:  Alert and Appropriate  Insight:  Appropriate and Improving  Engagement in Group:  Developing/Improving, Engaged, and Improving  Modes of Intervention:  Discussion, Education, and Problem-solving  Summary of Progress/Problems: pt discussed resources they could utilize with crisis planning and development given their pending discharge. Pt named three resources that could be contacted if a crisis occurred and coping strategies present weren't effective.   Suszanne Conners Tanya Crothers 01/04/2021, 4:59 PM

## 2021-01-04 NOTE — Group Note (Signed)
Occupational Therapy Group Note  Group Topic:Communication  Group Date: 01/04/2021 Start Time: 1400 End Time: 1445 Facilitators: Pierra Skora, OT/L   Group Description: Group encouraged increased engagement and participation through discussion focused on communication styles. Patients were educated on the different styles of communication including passive, aggressive, assertive, and passive-aggressive communication. Group members shared and reflected on which styles they most often find themselves communicating in and brainstormed strategies on how to transition and practice a more assertive approach. Further discussion explored how to use assertiveness skills and strategies to further advocate and ask questions as it relates to their treatment plan and mental health.   Therapeutic Goal(s): Identify practical strategies to improve communication skills  Identify how to use assertive communication skills to address individual needs and wants   Participation Level: OT Group cancelled d/t COVID+ status of unit. Will continue to engage patients in OT groups as scheduled and encourage participation/engagement.    Plan: Continue to engage patient in OT groups 2 - 3x/week.  01/04/2021  Noah Ramsey, MOT, OTR/L  

## 2021-01-04 NOTE — Progress Notes (Signed)
[facility] MD Progress Note  01/04/2021 5:34 PM Noah Ramsey  MRN:  [id number]  Reason for admission: Patient is a 25 year old male with history of schizoaffective disorder admitted for insomnia x 5 days, aggressive behavior, delusions, suicidal ideation and auditory hallucinations.  Objective: Medical record reviewed.  Patient's case discussed in detail with members of the treatment team.  I met with and evaluated the patient on the unit today for follow-up today.  Patient looks unchanged from yesterday.  He is pleasant and maintains good eye contact.  Patient is still loose and tangential at times.  He makes fewer mentions about the [place] today and does not make any paranoid statements.  He is focused on weight loss and diet supplements.  I again advised him that he should not take any diet supplements or purchase any supplements or medications online.  Patient says that he is looking forward to going home soon.  He ports good mood.  Patient denies anxiety, SI, AI, HI, PI, AH or VH.  He continues to display poor insight into his psychiatric condition and his need for medications.  Patient says he is eating and sleeping well.  He denies sore throat, cough, chills, trouble breathing, chest pain, palpitations, nausea, vomiting, diarrhea, constipation, muscle stiffness, myalgias, headache dizziness or other physical problems.  Staff document that he slept 5.75 hours last night.  Patient is taking scheduled medications as prescribed except he has been refusing propranolol and MiraLAX.  Patient has not taken any PRN medications in at least the last 4 days.  Staff document the patient continues to display magical thinking at times.  Principal Problem: Schizoaffective disorder ([place]) Diagnosis: Principal Problem:   Schizoaffective disorder ([place])  Total Time spent with patient:  15 minutes  Past Psychiatric History: See admission H&P  Past Medical History:  Past Medical History:  Diagnosis Date    Elevated CPK    per patient   Schizophrenia ([place])    History reviewed. No pertinent surgical history. Family History:  Family History  Problem Relation Age of Onset   Mental illness Brother    Family Psychiatric  History: See admission H&P Social History:  Social History   Substance and Sexual Activity  Alcohol Use Never     Social History   Substance and Sexual Activity  Drug Use Never    Social History   Socioeconomic History   Marital status: Single    Spouse name: Not on file   Number of children: Not on file   Years of education: Not on file   Highest education level: Not on file  Occupational History   Not on file  Tobacco Use   Smoking status: Never   Smokeless tobacco: Never  Substance and Sexual Activity   Alcohol use: Never   Drug use: Never   Sexual activity: Not on file  Other Topics Concern   Not on file  Social History Narrative   Not on file   Social Determinants of Health   Financial Resource Strain: Not on file  Food Insecurity: Not on file  Transportation Needs: Not on file  Physical Activity: Not on file  Stress: Not on file  Social Connections: Not on file   Additional Social History:                         Sleep: Good  Appetite:  Good  Current Medications: Current Facility-Administered Medications  Medication Dose Route Frequency Provider Last Rate Last Admin   acetaminophen (  TYLENOL) tablet 650 mg  650 mg Oral Q6H PRN Rankin, Shuvon B, NP   650 mg at 12/14/20 0203   alum & mag hydroxide-simeth (MAALOX/MYLANTA) 200-200-20 MG/5ML suspension 30 mL  30 mL Oral Q4H PRN Rankin, Shuvon B, NP       benztropine (COGENTIN) tablet 0.5 mg  0.5 mg Oral Q6H PRN Arthor Captain, MD       diphenhydrAMINE (BENADRYL) injection 50 mg  50 mg Intramuscular Q6H PRN Arthor Captain, MD       doxepin (SINEQUAN) capsule 10 mg  10 mg Oral QHS Lavella Hammock, MD   10 mg at 01/03/21 2050   gabapentin (NEURONTIN) capsule 300 mg  300 mg Oral QHS  Arthor Captain, MD   300 mg at 01/03/21 2050   hydrOXYzine (ATARAX/VISTARIL) tablet 25 mg  25 mg Oral Q6H PRN Arthor Captain, MD   25 mg at 12/25/20 2126   LORazepam (ATIVAN) injection 2 mg  2 mg Intramuscular BID PRN Arthor Captain, MD       LORazepam (ATIVAN) tablet 1 mg  1 mg Oral Q6H PRN Arthor Captain, MD   1 mg at 12/27/20 0951   magnesium hydroxide (MILK OF MAGNESIA) suspension 30 mL  30 mL Oral Daily PRN Rankin, Shuvon B, NP       metFORMIN (GLUCOPHAGE) tablet 500 mg  500 mg Oral BID WC Rankin, Shuvon B, NP   500 mg at 01/04/21 0750   ondansetron (ZOFRAN) tablet 4 mg  4 mg Oral Q8H PRN Arthor Captain, MD       [START ON 01/21/2021] paliperidone (INVEGA SUSTENNA) injection 234 mg  234 mg Intramuscular Q28 days Arthor Captain, MD       polyethylene glycol (MIRALAX / GLYCOLAX) packet 17 g  17 g Oral Daily Arthor Captain, MD   17 g at 01/02/21 0804   propranolol (INDERAL) tablet 40 mg  40 mg Oral Q8H Arthor Captain, MD   40 mg at 01/03/21 1443   risperiDONE (RISPERDAL M-TABS) disintegrating tablet 2 mg  2 mg Oral Daily Arthor Captain, MD   2 mg at 01/04/21 0750   risperiDONE (RISPERDAL M-TABS) disintegrating tablet 3 mg  3 mg Oral QHS Arthor Captain, MD   3 mg at 01/03/21 2050    Lab Results:  Results for orders placed or performed during the hospital encounter of 12/03/20 (from the past 48 hour(s))  Basic metabolic panel     Status: Abnormal   Collection Time: 01/04/21  6:30 AM  Result Value Ref Range   Sodium 138 135 - 145 mmol/L   Potassium 3.8 3.5 - 5.1 mmol/L   Chloride 102 98 - 111 mmol/L   CO2 27 22 - 32 mmol/L   Glucose, Bld 107 (H) 70 - 99 mg/dL    Comment: Glucose reference range applies only to samples taken after fasting for at least 8 hours.   BUN 16 6 - 20 mg/dL   Creatinine, Ser 0.98 0.61 - 1.24 mg/dL   Calcium 9.5 8.9 - 10.3 mg/dL   GFR, Estimated >60 >60 mL/min    Comment: (NOTE) Calculated using the CKD-EPI Creatinine Equation (2021)    Anion gap 9 5 -  15    Comment: Performed at [facility], [place] [address] [place], [place] [address]       Blood Alcohol level:  Lab Results  Component Value Date   ETH <10 11/29/2020   ETH <  10 74/94/4967    Metabolic Disorder Labs: Lab Results  Component Value Date   HGBA1C 5.5 12/04/2020   MPG 111.15 12/04/2020   No results found for: PROLACTIN Lab Results  Component Value Date   CHOL 123 12/02/2020   TRIG 74 12/02/2020   HDL 35 (L) 12/02/2020   CHOLHDL 3.5 12/02/2020   VLDL 15 12/02/2020   LDLCALC 73 12/02/2020   LDLCALC 101 (H) 06/03/2019    Physical Findings: AIMS: Facial and Oral Movements Muscles of Facial Expression: None, normal Lips and Perioral Area: None, normal Jaw: None, normal Tongue: None, normal,Extremity Movements Upper (arms, wrists, hands, fingers): None, normal Lower (legs, knees, ankles, toes): None, normal, Trunk Movements Neck, shoulders, hips: None, normal, Overall Severity Severity of abnormal movements (highest score from questions above): None, normal Incapacitation due to abnormal movements: None, normal Patient's awareness of abnormal movements (rate only patient's report): No Awareness, Dental Status Current problems with teeth and/or dentures?: No Does patient usually wear dentures?: No  CIWA:    COWS:     Musculoskeletal: Strength & Muscle Tone: within normal limits Gait & Station: normal Patient leans: N/A  Psychiatric Specialty Exam:  Presentation  General Appearance: Appropriate for Environment; Fairly Groomed  Eye Contact:Good  Speech:Clear and Coherent  Speech Volume:Normal  Handedness:Right   Mood and Affect  Mood:Euthymic  Affect:Constricted   Thought Process  Thought Processes:Coherent  Descriptions of Associations:Tangential (intermittently loose)  Orientation:Full (Time, Place and Person)  Thought Content:Delusions  History of Schizophrenia/Schizoaffective disorder:Yes  Duration of  Psychotic Symptoms:Greater than six months  Hallucinations:Hallucinations: Other (comment) (denies hallucinations today)  Ideas of Reference:None  Suicidal Thoughts:Suicidal Thoughts: No  Homicidal Thoughts:Homicidal Thoughts: No   Sensorium  Memory:Immediate Fair; Recent Fair  Judgment:Impaired  Insight:Shallow   Executive Functions  Concentration:Fair  Attention Span:Fair  [place]  Language:Good   Psychomotor Activity  Psychomotor Activity:Psychomotor Activity: Normal   Assets  Assets:Communication Skills; Desire for Improvement; Housing; Social Support   Sleep  Sleep:Sleep: Good Number of Hours of Sleep: 5.75    Physical Exam: Physical Exam Vitals and nursing note reviewed.  Constitutional:      General: He is not in acute distress.    Appearance: He is not diaphoretic.  HENT:     Head: Normocephalic and atraumatic.  Pulmonary:     Effort: Pulmonary effort is normal.  Neurological:     General: No focal deficit present.     Mental Status: He is alert and oriented to person, place, and time.   Review of Systems  Constitutional:  Negative for chills, diaphoresis and fever.  HENT:  Negative for sore throat.   Respiratory:  Negative for cough and shortness of breath.   Cardiovascular:  Negative for chest pain and palpitations.  Gastrointestinal:  Negative for constipation, diarrhea, nausea and vomiting.  Musculoskeletal:  Positive for joint pain. Negative for myalgias.       Positive for chronic left knee pain.  Neurological:  Negative for dizziness, tremors, seizures and headaches.  Psychiatric/Behavioral:  Negative for depression, hallucinations, substance abuse and suicidal ideas. The patient is not nervous/anxious and does not have insomnia.   All other systems reviewed and are negative. Blood pressure (!) 135/103, pulse (!) 131, temperature 97.8 F (36.6 C), temperature source Oral, resp. rate 16, height _0   (1.676 m), weight 134.3 kg, SpO2 100 %. Body mass index is 47.78 kg/m.   Treatment Plan Summary: Patient is a 25 year old male with schizoaffective disorder, bipolar type admitted for treatment  and stabilization of worsening psychotic and mood symptoms, suicidal ideation and aggressive behavior.  Patient is displaying some improvement in organization of thought processes and is less exclusively focused on delusional themes.  Patient has tolerated the first 2 loading doses of Invega Sustenna LAI.  Patient insisted on receiving first two [place] LAI loading doses via gluteal rather than deltoid injection.  Patient is improving on current medications with more organized thought processes and decreased delusional statements.  Some residual psychotic symptoms persist.  Patient appears to be at baseline and is ready for discharge when family can accept him.  Plan is to continue [place] LAI and oral Risperdal for now.  Patient may need to remain on oral antipsychotic agent in addition to [place] long-term to adequately address psychotic symptoms.  Daily contact with patient to assess and evaluate symptoms and progress in treatment and Medication management  Schizoaffective disorder -Continue Risperdal M tabs 2 mg PO QAM and 3 mg PO QHS for psychotic symptoms for now.   -Continue Invega Sustenna LAI q. 28 days.  First loading dose of 234 mg IM administered 12/20/2020.  Second loading dose of 156 mg IM was administered on 12/24/2020.  Plan is for maintenance dosing of Invega Sustenna LAI 234 mg IM every 28 days with next dose due approximately 01/21/2021.      Anxiety/EPS -Continue propranolol 40 mg Q8H for restlessness and anxiety -Continue benztropine 0.5 mg PO Q6H PRN for tremors, muscle stiffness -Continue diphenhydramine 50 mg IM every 6 hours as needed for acute dystonic reaction  Diabetes Mellitus -Continue metformin 500 mg twice daily with meals  Agitation  -Continue  lorazepam 1 mg PO Q6H PRN anxiety, restlessness, agitation -Continue lorazepam 2 mg IM BID PRN for severe agitation only if patient unable to take oral PRN medications for agitation  Anxiety -Continue hydroxyzine 25 mg Q6H PRN  Insomnia -Continue gabapentin 300 mg at bedtime for RLS, sleep -Continue doxepin 10 mg at bedtime   Constipation -Continue MiraLAX 17 g daily -Continue MOM 30 mL daily PRN -Encourage intake of p.o. fluids  Positive COVID test on 12/27/2020 -Continue airborne and contact precautions -Patient remains afebrile and is denying symptoms  Discharge planning in progress.    Arthor Captain, MD 01/04/2021, 5:34 PM

## 2021-01-04 NOTE — Group Note (Signed)
Recreation Therapy Group Note   Group Topic:Goal Setting  Group Date: 01/04/2021 Start Time: 1030 End Time: 1100 Facilitators: Caroll Rancher, LRT/CTRS Location: [address] Dayroom  Goal Area(s) Addresses:  Patient will identify long term goals. Patient will identify short term goals. Patient will identify barriers to achieving goals. Patient will identify what they can do to work towards goals post d/c.  Group Description: Goal Planning.  Patients and LRT discussed what goals were and what makes a goal achievable.  Patients were given a worksheet where they were to identify goals they wish to accomplish in a week, month, year and five years.  Patients would then identify obstacles to reaching those goals, what would be needed to be successful and what they can start to do immediately to work towards goals.    Affect/Mood: Appropriate   Participation Level: Engaged   Modes of Intervention: 1:1   Patient Response to Interventions:  Engaged    Clinical Observations/Individualized Feedback: LRT did 1:1 interactions with patients.  Pt was appropriate but sarcastic at times. Pt answered questions asked of him.  Pt expressed wanting to become a Curator by "applying for a job".  Pt later expressed wanting to do a fast track program to learn about mechanics.  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   Pt also expressed wanting to work for only [employer].  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  Pt wants sister-in law to be the accountant for his business and wants to name it "[employer] and [employer]".  Pt explained nothing would stop him from reaching goals and he would need to be consistent to reach goals.  Pt also stated he start working towards goals by "stop complaining" and have mental toughness.   Plan: Continue to engage patient in RT group  sessions 2-3x/week.   Caroll Rancher, LRT/CTRS 01/04/2021 12:08 PM

## 2021-01-04 NOTE — Progress Notes (Signed)
Pt continues to refuse his Bp medication

## 2021-01-04 NOTE — Progress Notes (Signed)
Progress note  Pt continues to refuse bp medication. Education provided. Will continue to monitor.     01/04/21 0900  Psych Admission Type (Psych Patients Only)  Admission Status Involuntary  Psychosocial Assessment  Patient Complaints None  Eye Contact Fair  Facial Expression Animated  Affect Appropriate to circumstance  Speech Logical/coherent  Interaction Assertive  Motor Activity Slow  Appearance/Hygiene Unremarkable  Behavior Characteristics Cooperative;Appropriate to situation  Mood Pleasant  Thought Process  Coherency Concrete thinking  Content Magical thinking  Delusions None reported or observed  Perception Hallucinations  Hallucination Auditory  Judgment Limited  Confusion None  Danger to Self  Current suicidal ideation? Denies  Danger to Others  Danger to Others None reported or observed

## 2021-01-05 ENCOUNTER — Encounter (HOSPITAL_COMMUNITY): Payer: Self-pay

## 2021-01-05 MED ORDER — RISPERIDONE 2 MG PO TABS: 2.0000 mg | ORAL_TABLET | Freq: Two times a day (BID) | ORAL | 0 refills | Status: DC

## 2021-01-05 MED ORDER — PALIPERIDONE PALMITATE ER 234 MG/1.5ML IM SUSY: 234.0000 mg | PREFILLED_SYRINGE | INTRAMUSCULAR | 0 refills | Status: DC

## 2021-01-05 MED ORDER — DOXEPIN HCL 10 MG PO CAPS: 10.0000 mg | ORAL_CAPSULE | Freq: Every day | ORAL | 0 refills | Status: DC

## 2021-01-05 MED ORDER — GABAPENTIN 300 MG PO CAPS: 300.0000 mg | ORAL_CAPSULE | Freq: Every day | ORAL | 0 refills | Status: DC

## 2021-01-05 MED ORDER — POLYETHYLENE GLYCOL 3350 17 G PO PACK: 17.0000 g | PACK | Freq: Every day | ORAL | 0 refills | Status: DC

## 2021-01-05 MED ORDER — RISPERIDONE 2 MG PO TABS
2.0000 mg | ORAL_TABLET | Freq: Two times a day (BID) | ORAL | Status: DC
  Filled 2021-01-05 (×2): qty 1

## 2021-01-05 MED ORDER — PROPRANOLOL HCL 40 MG PO TABS: 40.0000 mg | ORAL_TABLET | Freq: Three times a day (TID) | ORAL | 0 refills | Status: DC

## 2021-01-05 MED ORDER — BENZTROPINE MESYLATE 0.5 MG PO TABS: 0.5000 mg | ORAL_TABLET | Freq: Four times a day (QID) | ORAL | 0 refills | Status: DC | PRN

## 2021-01-05 MED ORDER — METFORMIN HCL 500 MG PO TABS: 500.0000 mg | ORAL_TABLET | Freq: Two times a day (BID) | ORAL | 0 refills | Status: DC

## 2021-01-05 MED ORDER — HYDROXYZINE HCL 25 MG PO TABS: 25.0000 mg | ORAL_TABLET | Freq: Four times a day (QID) | ORAL | 0 refills | Status: DC | PRN

## 2021-01-05 NOTE — [facility] Group Notes (Signed)
[facility] Group Notes:  (Nursing/MHT/Case Management/Adjunct)  Date:  01/05/2021  Time:  11:47 AM  Type of Therapy:   1:1 Orientation/Goals Group  Participation Level:  Active  Participation Quality:  Appropriate  Affect:  Appropriate  Cognitive:  Appropriate  Insight:  Good and Improving  Engagement in Group:  Engaged and Improving  Modes of Intervention:  Orientation  Summary of Progress/Problems: Pt goal for today is to exercise and talk to his mother about going home.   Adem Costlow J Hosey Burmester 01/05/2021, 11:47 AM

## 2021-01-05 NOTE — [facility] Suicide Risk Assessment (Signed)
[facility] Discharge Suicide Risk Assessment   Principal Problem: Schizoaffective disorder [facility]) Discharge Diagnoses: Principal Problem:   Schizoaffective disorder (HCC)   Total Time spent with patient: 20 minutes  Musculoskeletal: Strength & Muscle Tone: within normal limits Gait & Station: normal Patient leans: N/A  Psychiatric Specialty Exam  Presentation  General Appearance: Appropriate for Environment; Fairly Groomed  Eye Contact:Good  Speech:Clear and Coherent  Speech Volume:Normal  Handedness:Right   Mood and Affect  Mood:Euthymic  Duration of Depression Symptoms: No data recorded Affect:Constricted   Thought Process  Thought Processes:Coherent  Descriptions of Associations:Tangential  Orientation:Full (Time, Place and Person)  Thought Content:Delusions  History of Schizophrenia/Schizoaffective disorder:Yes  Duration of Psychotic Symptoms:Greater than six months  Hallucinations:Hallucinations: Other (comment) (Denies hallucinations)  Ideas of Reference:Delusions  Suicidal Thoughts:Suicidal Thoughts: No  Homicidal Thoughts:Homicidal Thoughts: No   Sensorium  Memory:Immediate Fair; Recent Fair  Judgment:Impaired  Insight:Shallow   Executive Functions  Concentration:Fair  Attention Span:Fair  Recall:Fair  Fund of Knowledge:Fair  Language:Good   Psychomotor Activity  Psychomotor Activity: Psychomotor Activity: Normal  Assets  Assets:Communication Skills; Desire for Improvement; Housing; Social Support; Leisure Time   Sleep  Sleep:Sleep: Good Number of Hours of Sleep: 5.75   Physical Exam: Physical Exam Vitals and nursing note reviewed.  Constitutional:      General: He is not in acute distress.    Appearance: Normal appearance. He is not diaphoretic.  HENT:     Head: Normocephalic and atraumatic.  Cardiovascular:     Rate and Rhythm: Tachycardia present.  Pulmonary:     Effort: Pulmonary effort is normal.   Neurological:     General: No focal deficit present.     Mental Status: He is alert and oriented to person, place, and time.   Review of Systems  Constitutional: Negative.   HENT: Negative.    Respiratory: Negative.    Cardiovascular: Negative.   Gastrointestinal: Negative.   Musculoskeletal: Negative.   Neurological: Negative.   Psychiatric/Behavioral:  Negative for depression, hallucinations, substance abuse and suicidal ideas. The patient is not nervous/anxious and does not have insomnia.   All other systems reviewed and are negative.  Vital signs at 6 AM on 01/05/2021: BP 120/62 sitting and 135/64 standing; pulse 109 sitting and 129 standing; O2 sat 100% on room air and temperature of 97.7.  (Refused this morning's dose of propranolol) Blood pressure 135/64, pulse (!) 129, temperature 97.7 F (36.5 C), temperature source Oral, resp. rate 16, height 5\' 6"  (1.676 m), weight 134.3 kg, SpO2 100 %. Body mass index is 47.78 kg/m.  Mental Status Per Nursing Assessment::   On Admission:  NA  Demographic Factors:  Male, Adolescent or young adult, and Unemployed  Loss Factors: NA  Historical Factors: Family history of mental illness or substance abuse and Impulsivity  Risk Reduction Factors:   Sense of responsibility to family, Living with another person, especially a relative, Future oriented outlook, Positive life satisfaction, and Positive social support  Continued Clinical Symptoms:  Schizophrenia:   Less than 25 years old Paranoid or undifferentiated type  Cognitive Features That Contribute To Risk:  Thought constriction (tunnel vision)    Suicide Risk:  Minimal acute risk: No identifiable suicidal ideation.  Patients presenting with no risk factors but with morbid ruminations; may be classified as minimal risk based on the severity of the depressive symptoms   Follow-up Information     [facility] Go on 01/06/2021.   Why: You have an appointment for medication  management services on Saturday,  01/06/21 at 1:10 pm.  This appointment will be held in person. When injection is due will need to pick up from pharmacy prior to appointments. Contact information: [address] [place] [address] [phone]         [facility] Follow up.   Why: A referral has been made on your behalf for ACTT services. Contact information: [address] [place] [address] [phone]         [facility] Follow up on 01/08/2021.   Why: You have an intake appointment for CST (community support team) on 01/08/2021 at 10:30 am. This appointment will be held in person.  Bring photo ID, and insurance card. Contact information: [address] [place] [address]  P: [phone] F: [phone]        [facility] Go on 01/08/2021.   Why: You have an intake assessment appointment on 01/08/21 at 11:00 am for [facility] community support services,  This appointment will be held in person. Contact information: [address] [place] [address] [phone]                 Plan Of Care/Follow-up recommendations:  Activity:  as tolerated  Tests:  You will periodically need to have blood drawn for lab work to monitor your cholesterol and blood sugar while you are taking your current medications.  Your outpatient doctor will let you know when lab work needs to be performed.  Other:   -Take medications as prescribed.   -Your next dose of [place] (paliperidone) long-acting injectable medication is due approximately 01/21/2021. -Do not drink alcohol.  Do not use marijuana/cannabis or other drugs.   -Keep outpatient mental health follow-up appointments with community support team (CST), therapist and psychiatrist.   -See your primary care provider for treatment of medical conditions.  Claudie Revering, MD 01/05/2021, 10:09 AM

## 2021-01-05 NOTE — BH IP Treatment Plan (Signed)
Interdisciplinary Treatment and Diagnostic Plan Update  01/05/2021 Time of Session:  Noah Ramsey MRN: [id number]  Principal Diagnosis: Schizoaffective disorder [facility])  Secondary Diagnoses: Principal Problem:   Schizoaffective disorder (HCC)   Current Medications:  Current Facility-Administered Medications  Medication Dose Route Frequency Provider Last Rate Last Admin   acetaminophen (TYLENOL) tablet 650 mg  650 mg Oral Q6H PRN Rankin, Shuvon B, NP   650 mg at 12/14/20 0203   alum & mag hydroxide-simeth (MAALOX/MYLANTA) 200-200-20 MG/5ML suspension 30 mL  30 mL Oral Q4H PRN Rankin, Shuvon B, NP       benztropine (COGENTIN) tablet 0.5 mg  0.5 mg Oral Q6H PRN Claudie Revering, MD       diphenhydrAMINE (BENADRYL) injection 50 mg  50 mg Intramuscular Q6H PRN Claudie Revering, MD       doxepin (SINEQUAN) capsule 10 mg  10 mg Oral QHS Mariel Craft, MD   10 mg at 01/04/21 2031   gabapentin (NEURONTIN) capsule 300 mg  300 mg Oral QHS Claudie Revering, MD   300 mg at 01/04/21 2031   hydrOXYzine (ATARAX/VISTARIL) tablet 25 mg  25 mg Oral Q6H PRN Claudie Revering, MD   25 mg at 12/25/20 2126   LORazepam (ATIVAN) injection 2 mg  2 mg Intramuscular BID PRN Claudie Revering, MD       LORazepam (ATIVAN) tablet 1 mg  1 mg Oral Q6H PRN Claudie Revering, MD   1 mg at 12/27/20 0951   magnesium hydroxide (MILK OF MAGNESIA) suspension 30 mL  30 mL Oral Daily PRN Rankin, Shuvon B, NP       metFORMIN (GLUCOPHAGE) tablet 500 mg  500 mg Oral BID WC Rankin, Shuvon B, NP   500 mg at 01/05/21 0814   ondansetron (ZOFRAN) tablet 4 mg  4 mg Oral Q8H PRN Claudie Revering, MD       [START ON 01/21/2021] paliperidone (INVEGA SUSTENNA) injection 234 mg  234 mg Intramuscular Q28 days Claudie Revering, MD       polyethylene glycol (MIRALAX / GLYCOLAX) packet 17 g  17 g Oral Daily Claudie Revering, MD   17 g at 01/05/21 0814   propranolol (INDERAL) tablet 40 mg  40 mg Oral Q8H Claudie Revering, MD   40 mg at 01/03/21 1443    risperiDONE (RISPERDAL) tablet 2 mg  2 mg Oral BID Claudie Revering, MD       PTA Medications: Medications Prior to Admission  Medication Sig Dispense Refill Last Dose   diazepam (VALIUM) 5 MG tablet Take 5 mg by mouth 2 (two) times daily as needed for anxiety.      haloperidol (HALDOL) 10 MG tablet Take 10 mg by mouth at bedtime. (Patient not taking: No sig reported)      haloperidol decanoate (HALDOL DECANOATE) 100 MG/ML injection Inject 100 mg into the muscle every 28 (twenty-eight) days. (Patient not taking: No sig reported)      LATUDA 40 MG TABS tablet Take 40 mg by mouth at bedtime.      metFORMIN (GLUCOPHAGE) 500 MG tablet Take 1 tablet (500 mg total) by mouth 2 (two) times daily with a meal. (Patient not taking: No sig reported) 180 tablet 3    OVER THE COUNTER MEDICATION Ripped Fat Burner (diet pill) (Patient not taking: Reported on 12/06/2020)   Not Taking   OVER THE COUNTER MEDICATION Yohimbine (diet pill) (Patient not taking: Reported on 12/06/2020)   Not Taking  propranolol (INDERAL) 40 MG tablet Take 40 mg by mouth 3 (three) times daily.       Patient Stressors: Financial difficulties Medication change or noncompliance  Patient Strengths: Manufacturing systems engineer Supportive family/friends  Treatment Modalities: Medication Management, Group therapy, Case management,  1 to 1 session with clinician, Psychoeducation, Recreational therapy.   Physician Treatment Plan for Primary Diagnosis: Schizoaffective disorder (HCC) Long Term Goal(s): Improvement in symptoms so as ready for discharge   Short Term Goals: Ability to identify changes in lifestyle to reduce recurrence of condition will improve Ability to verbalize feelings will improve Ability to disclose and discuss suicidal ideas Ability to demonstrate self-control will improve Ability to identify and develop effective coping behaviors will improve Ability to maintain clinical measurements within normal limits will  improve Compliance with prescribed medications will improve  Medication Management: Evaluate patient's response, side effects, and tolerance of medication regimen.  Therapeutic Interventions: 1 to 1 sessions, Unit Group sessions and Medication administration.  Evaluation of Outcomes: Adequate for Discharge  Physician Treatment Plan for Secondary Diagnosis: Principal Problem:   Schizoaffective disorder (HCC)  Long Term Goal(s): Improvement in symptoms so as ready for discharge   Short Term Goals: Ability to identify changes in lifestyle to reduce recurrence of condition will improve Ability to verbalize feelings will improve Ability to disclose and discuss suicidal ideas Ability to demonstrate self-control will improve Ability to identify and develop effective coping behaviors will improve Ability to maintain clinical measurements within normal limits will improve Compliance with prescribed medications will improve     Medication Management: Evaluate patient's response, side effects, and tolerance of medication regimen.  Therapeutic Interventions: 1 to 1 sessions, Unit Group sessions and Medication administration.  Evaluation of Outcomes: Adequate for Discharge   RN Treatment Plan for Primary Diagnosis: Schizoaffective disorder (HCC) Long Term Goal(s): Knowledge of disease and therapeutic regimen to maintain health will improve  Short Term Goals: Ability to demonstrate self-control, Ability to participate in decision making will improve, and Ability to verbalize feelings will improve  Medication Management: RN will administer medications as ordered by provider, will assess and evaluate patient's response and provide education to patient for prescribed medication. RN will report any adverse and/or side effects to prescribing provider.  Therapeutic Interventions: 1 on 1 counseling sessions, Psychoeducation, Medication administration, Evaluate responses to treatment, Monitor vital  signs and CBGs as ordered, Perform/monitor CIWA, COWS, AIMS and Fall Risk screenings as ordered, Perform wound care treatments as ordered.  Evaluation of Outcomes: Adequate for Discharge   LCSW Treatment Plan for Primary Diagnosis: Schizoaffective disorder [facility]) Long Term Goal(s): Safe transition to appropriate next level of care at discharge, Engage patient in therapeutic group addressing interpersonal concerns.  Short Term Goals: Engage patient in aftercare planning with referrals and resources, Increase social support, and Increase ability to appropriately verbalize feelings  Therapeutic Interventions: Assess for all discharge needs, 1 to 1 time with Social worker, Explore available resources and support systems, Assess for adequacy in community support network, Educate family and significant other(s) on suicide prevention, Complete Psychosocial Assessment, Interpersonal group therapy.  Evaluation of Outcomes: Adequate for Discharge   Progress in Treatment: Attending groups: Yes. Participating in groups: Yes. Taking medication as prescribed: Yes. Toleration medication: Yes. Family/Significant other contact made: Yes, individual(s) contacted:  mother Patient understands diagnosis: No. Discussing patient identified problems/goals with staff: No. Medical problems stabilized or resolved: Yes. Denies suicidal/homicidal ideation: Yes. Issues/concerns per patient self-inventory: No. Other: None  New problem(s) identified: No, Describe:  None  New Short  Term/Long Term Goal(s):medication stabilization, elimination of SI thoughts, development of comprehensive mental wellness plan.   Patient Goals:  Did Not Attend  Discharge Plan or Barriers: Pt has been referred to ACT services. Pt will f/u at [facility] for medication management. Pt has been referred to the [facility] program at [facility].   Reason for Continuation of Hospitalization: Medication stabilization  Estimated Length of  Stay: 3-5 days   Scribe for Treatment Team: Chrys Racer 01/05/2021 10:17 AM

## 2021-01-05 NOTE — Progress Notes (Signed)
D: Pt A & O X 2. Denies SI, HI, AVH and pain at this time. D/C home as ordered. Picked up in lobby by his mother. A: D/C instructions reviewed with pt and mother including prescriptions and follow up appointments; compliance encouraged. All belongings from locker 7 given to pt at time of departure. Scheduled medications given with verbal education and effects monitored. Safety checks maintained without incident till time of d/c.  R: Pt receptive to care. Compliant with medications when offered. Denies adverse drug reactions when assessed. Pt and mother verbalized understanding related to d/c instructions. Signed belonging sheet in agreement with item received from locker. Ambulatory with a steady gait. Appears to be in no physical distress at time of departure.

## 2021-01-05 NOTE — Discharge Summary (Signed)
Physician Discharge Summary Note  Patient:  [place] [facility] is an 25 y.o., male MRN:  [id number] DOB:  [birth year] Patient phone:  [phone] (home)  Patient address:   [address] [place] [address],  Total Time spent with patient:  Greater than 30 minutes  Date of Admission:  12/03/2020 Date of Discharge: 01-05-21  Reason for Admission: Worsening suicidal ideation, homicidal ideation, auditory hallucinations & insomnia..    Principal Problem: Schizoaffective disorder [place] [facility])  Discharge Diagnoses: Principal Problem:   Schizoaffective disorder [facility])  Past Psychiatric History: Schizoaffective disorder.  Past Medical History:  Past Medical History:  Diagnosis Date   Elevated CPK    per patient   Schizophrenia [facility])    History reviewed. No pertinent surgical history. Family History:  Family History  Problem Relation Age of Onset   Mental illness Brother    Family Psychiatric  History: See H&P  Social History:  Social History   Substance and Sexual Activity  Alcohol Use Never     Social History   Substance and Sexual Activity  Drug Use Never    Social History   Socioeconomic History   Marital status: Single    Spouse name: Not on file   Number of children: Not on file   Years of education: Not on file   Highest education level: Not on file  Occupational History   Not on file  Tobacco Use   Smoking status: Never   Smokeless tobacco: Never  Substance and Sexual Activity   Alcohol use: Never   Drug use: Never   Sexual activity: Not on file  Other Topics Concern   Not on file  Social History Narrative   Not on file   Social Determinants of Health   Financial Resource Strain: Not on file  Food Insecurity: Not on file  Transportation Needs: Not on file  Physical Activity: Not on file  Stress: Not on file  Social Connections: Not on file   [facility] Course: (Per Md's admission evaluation notes): Patient is a 25 year old male with a  reported past psychiatric history significant for schizoaffective disorder; bipolar type who originally presented to the [facility] emergency department on 11/29/2020 secondary to suicidal ideation, homicidal ideation, auditory hallucinations and not sleeping.  The patient had been recently discharged from the [place] for similar complaints.  Apparently he had been given propranolol, diazepam and Latuda in an attempt to control his psychotic symptoms after he had been discharged from [facility].  The patient told me that he had been discharged on Abilify and the long-acting Abilify injection but that that had not worked well.  He is followed by [place] [facility] as an outpatient and had been previously given the [place] [facility].  The patient admitted that he has a longstanding history of noncompliance.  He admitted to auditory hallucinations to me today.  He did deny suicidal or homicidal ideation.  He stated that he has been admitted to the psychiatric [facility] between 10 and 15 times since he was diagnosed with schizophrenia versus schizoaffective disorder at age 79 or 49.  Collateral information obtained from his mother stated the patient had not been sleeping for 5 days, had increased aggression, and threats towards himself and others.  The patient stated "I feel like I am being harassed by my so-called stepfather, he thinks I had sex with my mom.  The patient stated that he had previously lived in [place] and moved here somewhere between 2018 and 2019.  He  is currently not working.  He did admit to previous use of substances including marijuana.  He was admitted to the [facility] for evaluation and stabilization.  He stated he had been on long-acting injectable medication, and the review of the limited information that we have in the electronic medical record revealed that he had been on Haldol decanoate in the past.  He stated he prefer not to take that because it made him "stiff and cause  problems with my bones".  He admitted to having previously been treated with the Latuda, Haldol, Abilify and others which he was unclear of.  On admission his EKG showed an incomplete right bundle branch block, but normal QTc interval and sinus rate.  No major abnormality secondary to the beta-blocker overdose.   Prior to this discharge, [facility] was seen & evaluated for mental health stability. The current laboratory findings were reviewed (stable), nurses notes & vital signs were reviewed as well. There are no current mental health or medical issues that should prevent this discharge at this time. Patient is being discharged to continue mental health care & medication management as noted below.   After evaluation of his presenting symptoms as noted above, [facility] was recommended for mood stabilization treatments. The medication regimen for his presenting symptoms were discussed & with his consent initiated. He received, stabilized & was discharged on the medications as listed below on his discharge medication lists. He was also enrolled & participated in the group counseling sessions being offered & held on this unit. He learned coping skills. He presented on this admission, no other chronic medical conditions that required treatment & monitoring. He was however treated/discharged on metformin 500 mg for the prevention of antipsychotic induced metabolic syndrome such as weight gain. He tolerated his treatment regimen without any adverse effects or reactions reported.   And because of the chronic nature of his psychiatric symptoms & their resistance to the treatment regimen, [facility] has been treated, stabilized & discharged on two separate antipsychotic medications (Invega injectable & Risperdal tabs). This is because he has not been able to achieve symptoms control under an antipsychotic monotherapy. The combination of these two antipsychotic therapies has proved effective is stabilizing his symptoms at  this time warranting this discharge. It will benefit [place] to continue on these combination therapies as recommended at this time. However, if his symptoms stabilize in the future, he medications may be titrated down to an antipsychotic monotherapy to help decrease the chances for the development of metabolic syndrome. This has to be done within the discretion & proper judgement of his outpatient provided.   During the course of his hospitalization, the 15-minute checks were adequate to ensure Boysie's safety.  Patient did not display any dangerous, violent or suicidal behavior on the unit.  He interacted with patients & staff appropriately, participated appropriately in the group sessions/therapies. His medications were addressed & adjusted to meet his needs. He was recommended for outpatient follow-up care & medication management upon discharge to assure his continuity of care.  At the time of this [facility] discharge, patient is not reporting any acute suicidal/homicidal ideations. He currently denies any [place] issues or concerns. Education and supportive counseling provided throughout his [facility] stay & upon discharge.   Today upon his discharge evaluation with the attending psychiatrist, Deano shares he is doing well. He denies any other specific concerns. He is sleeping well. His appetite is good. He denies other physical complaints. He denies AH/VH, delusional thoughts or paranoia. He does not appear  to be responding to any internal stimuli. He feels that his medications have been helpful & is in agreement to continue his current treatment regimen as recommended. He was able to engage in safety planning including plan to return to [facility] or contact emergency services if he feels unable to maintain his own safety or the safety of others. Pt had no further questions, comments, or concerns. He left [facility] with all personal belongings in no apparent distress. Transportation per his mother.    Physical  Findings: AIMS: Facial and Oral Movements Muscles of Facial Expression: None, normal Lips and Perioral Area: None, normal Jaw: None, normal Tongue: None, normal,Extremity Movements Upper (arms, wrists, hands, fingers): None, normal Lower (legs, knees, ankles, toes): None, normal, Trunk Movements Neck, shoulders, hips: None, normal, Overall Severity Severity of abnormal movements (highest score from questions above): None, normal Incapacitation due to abnormal movements: None, normal Patient's awareness of abnormal movements (rate only patient's report): No Awareness, Dental Status Current problems with teeth and/or dentures?: No Does patient usually wear dentures?: No  CIWA:    COWS:     Musculoskeletal: Strength & Muscle Tone: within normal limits Gait & Station: normal Patient leans: N/A  Psychiatric Specialty Exam:  Presentation  General Appearance: Appropriate for Environment; Fairly Groomed  Eye Contact:Good  Speech:Clear and Coherent  Speech Volume:Normal  Handedness:Right  Mood and Affect  Mood:Euthymic  Affect:Constricted  Thought Process  Thought Processes:Coherent  Descriptions of Associations:Tangential  Orientation:Full (Time, Place and Person)  Thought Content:Delusions  History of Schizophrenia/Schizoaffective disorder:Yes  Duration of Psychotic Symptoms:Greater than six months  Hallucinations:Hallucinations: Other (comment) (Denies hallucinations)  Ideas of Reference:Delusions  Suicidal Thoughts:Suicidal Thoughts: No  Homicidal Thoughts:Homicidal Thoughts: No  Sensorium  Memory:Immediate Fair; Recent Fair  Judgment:Impaired  Insight:Shallow  Executive Functions  Concentration:Fair  Attention Span:Fair  Recall:Fair  Fund of Knowledge:Fair  Language:Good  Psychomotor Activity  Psychomotor Activity:Psychomotor Activity: Normal  Assets  Assets:Communication Skills; Desire for Improvement; Housing; Social Support; Leisure  Time  Sleep  Sleep:Sleep: Good Number of Hours of Sleep: 5.75  Physical Exam: Physical Exam Vitals and nursing note reviewed.  HENT:     Head: Atraumatic.     Nose: Nose normal.     Mouth/Throat:     Pharynx: Oropharynx is clear.  Eyes:     Pupils: Pupils are equal, round, and reactive to light.  Cardiovascular:     Rate and Rhythm: Normal rate.     Pulses: Normal pulses.  Pulmonary:     Effort: Pulmonary effort is normal.  Genitourinary:    Comments: Deferred Musculoskeletal:        General: Normal range of motion.     Cervical back: Normal range of motion.  Skin:    General: Skin is warm and dry.  Neurological:     General: No focal deficit present.     Mental Status: He is alert and oriented to person, place, and time.   Review of Systems  Constitutional:  Negative for chills and fever.  HENT:  Negative for congestion and sore throat.   Eyes:  Negative for blurred vision.  Respiratory:  Negative for cough, shortness of breath and wheezing.   Cardiovascular:  Negative for chest pain and palpitations.  Gastrointestinal:  Negative for abdominal pain, constipation, diarrhea, heartburn, nausea and vomiting.  Genitourinary:  Negative for dysuria.  Musculoskeletal:  Negative for myalgias.  Neurological:  Negative for dizziness, tingling, tremors, sensory change, speech change, focal weakness, seizures, loss of consciousness, weakness and headaches.  Endo/Heme/Allergies:  Negative for environmental allergies and polydipsia. Does not bruise/bleed easily.       Allergies: NKDA  Psychiatric/Behavioral:  Positive for hallucinations (Hx. psychosis (stable on medication).). Negative for depression, memory loss, substance abuse and suicidal ideas. The patient is not nervous/anxious (Stable upon discharge) and does not have insomnia.   Blood pressure 135/64, pulse (!) 129, temperature 97.7 F (36.5 C), temperature source Oral, resp. rate 16, height 5\' 6"  (1.676 m), weight 134.3 kg,  SpO2 100 %. Body mass index is 47.78 kg/m.   Social History   Tobacco Use  Smoking Status Never  Smokeless Tobacco Never   Tobacco Cessation:  N/A, patient does not currently use tobacco products  Blood Alcohol level:  Lab Results  Component Value Date   ETH <10 11/29/2020   ETH <10 11/07/2020    Metabolic Disorder Labs:  Lab Results  Component Value Date   HGBA1C 5.5 12/04/2020   MPG 111.15 12/04/2020   No results found for: PROLACTIN Lab Results  Component Value Date   CHOL 123 12/02/2020   TRIG 74 12/02/2020   HDL 35 (L) 12/02/2020   CHOLHDL 3.5 12/02/2020   VLDL 15 12/02/2020   LDLCALC 73 12/02/2020   LDLCALC 101 (H) 06/03/2019    See Psychiatric Specialty Exam and Suicide Risk Assessment completed by Attending Physician prior to discharge.  Discharge destination:  Home  Is patient on multiple antipsychotic therapies at discharge:  Yes,   Do you recommend tapering to monotherapy for antipsychotics?  Yes   Has Patient had three or more failed trials of antipsychotic monotherapy by history:  Yes,   Antipsychotic medications that previously failed include:   1.  Haldol,., 2.  08/01/2019., and 3.  Invega.  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   Allergies as of 01/05/2021   No Known Allergies      Medication List     STOP taking these medications    diazepam 5 MG tablet Commonly known as: VALIUM   haloperidol 10 MG tablet Commonly known as: HALDOL   haloperidol decanoate 100 MG/ML injection Commonly known as: HALDOL DECANOATE   Latuda 40 MG Tabs tablet Generic drug: lurasidone   OVER THE COUNTER MEDICATION   OVER THE COUNTER MEDICATION       TAKE these medications      Indication  benztropine 0.5 MG tablet Commonly known as: COGENTIN Take 1 tablet (0.5 mg total) by mouth  every 6 (six) hours as needed for tremors.  Indication: Extrapyramidal Reaction caused by Medications   doxepin 10 MG capsule Commonly known as: SINEQUAN Take 1 capsule (10 mg total) by mouth at bedtime. For sleep/anxiety  Indication: Feeling Anxious, Sleep   gabapentin 300 MG capsule Commonly known as: NEURONTIN Take 1 capsule (300 mg total) by mouth at bedtime. For agitation  Indication: Agitation   hydrOXYzine 25 MG tablet Commonly known as: ATARAX/VISTARIL Take 1 tablet (25 mg total) by mouth every 6 (six) hours as needed for anxiety.  Indication: Feeling Anxious   metFORMIN 500 MG tablet Commonly known as: GLUCOPHAGE Take 1 tablet (500 mg total) by mouth 2 (two) times daily with a meal. :Medication induced wt management What changed: additional instructions  Indication: Antipsychotic Therapy-Induced Weight Gain   paliperidone 234 MG/1.5ML Susy injection Commonly known as: INVEGA SUSTENNA Inject 234 mg into the muscle every 28 (twenty-eight) days. Due on 01-21-21: For mood control Start taking  on: January 21, 2021  Indication: Mood control   polyethylene glycol 17 g packet Commonly known as: MIRALAX / GLYCOLAX Take 17 g by mouth daily. For constipation Start taking on: January 06, 2021  Indication: Constipation   propranolol 40 MG tablet Commonly known as: INDERAL Take 1 tablet (40 mg total) by mouth every 8 (eight) hours. For anxiety What changed:  when to take this additional instructions  Indication: Feeling Anxious   risperiDONE 2 MG tablet Commonly known as: RISPERDAL Take 1 tablet (2 mg total) by mouth 2 (two) times daily. For mood control  Indication: Mood control        Follow-up Information     [facility] Go on 01/06/2021.   Why: You have an appointment for medication management services on Saturday, 01/06/21 at 1:10 pm.  This appointment will be held in person. When injection is due will need to pick up from pharmacy prior to  appointments. Contact information: [address] [place] [place] [address] [phone]         [facility] Follow up.   Why: A referral has been made on your behalf for ACTT services. Contact information: [address] [place] [address] [phone]         [facility] Follow up on 01/08/2021.   Why: You have an intake appointment for CST (community support team) on 01/08/2021 at 10:30 am. This appointment will be held in person.  Bring photo ID, and insurance card. Contact information: [address] [place] [address]  P: [phone] F: [phone]        [facility] Go on 01/08/2021.   Why: You have an intake assessment appointment on 01/08/21 at 11:00 am for [facility] community support services,  This appointment will be held in person. Contact information: [address] [place] [address] [phone]                Follow-up recommendations: Activity:  As tolerated Diet: As recommended by your primary care doctor. Keep all scheduled follow-up appointments as recommended.   Comments: Prescriptions given at discharge.  Patient agreeable to plan.  Given opportunity to ask questions.  Appears to feel comfortable with discharge denies any current suicidal or homicidal thought. Patient is also instructed prior to discharge to: Take all medications as prescribed by his/her mental healthcare provider. Report any adverse effects and or reactions from the medicines to his/her outpatient provider promptly. Patient has been instructed & cautioned: To not engage in alcohol and or illegal drug use while on prescription medicines. In the event of worsening symptoms, patient is instructed to call the crisis hotline, 911 and or go to the nearest ED for appropriate evaluation and treatment of symptoms. To follow-up with his/her primary care provider for your other medical issues, concerns and or health care needs.    Signed: Armandina Stammer, NP, pmhnp, fnp-bc 01/05/2021, 3:05 PM

## 2021-01-05 NOTE — Progress Notes (Signed)
  [facility] Adult Case Management Discharge Plan :  Will you be returning to the same living situation after discharge:  Yes,  home with mother At discharge, do you have transportation home?: Yes,  mother to pick this pt up Do you have the ability to pay for your medications: Yes,  has insurance  Release of information consent forms completed and in the chart;  Patient's signature needed at discharge.  Patient to Follow up at:  Follow-up Information     [facility] Go on 01/06/2021.   Why: You have an appointment for medication management services on Saturday, 01/06/21 at 1:10 pm.  This appointment will be held in person. When injection is due will need to pick up from pharmacy prior to appointments. Contact information: [address] [place] [address] [phone]         [facility] Follow up.   Why: A referral has been made on your behalf for ACTT services. Contact information: [address] [place] [address] [phone]         [facility] Follow up on 01/08/2021.   Why: You have an intake appointment for CST (community support team) on 01/08/2021 at 10:30 am. This appointment will be held in person.  Bring photo ID, and insurance card. Contact information: [address] [place] [address]  P: [phone] F: [phone]        [facility] Go on 01/08/2021.   Why: You have an intake assessment appointment on 01/08/21 at 11:00 am for [facility] community support services,  This appointment will be held in person. Contact information: [address] [place] [address] [phone]                 Next level of care provider has access to [facility] Link:no  Safety Planning and Suicide Prevention discussed: Yes,  with mother     Has patient been referred to the Quitline?: N/A patient is not a smoker  Patient has been referred for addiction treatment: N/A  Otelia Santee,  LCSW 01/05/2021, 9:31 AM

## 2021-01-21 ENCOUNTER — Ambulatory Visit (HOSPITAL_COMMUNITY)
Admission: RE | Admit: 2021-01-21 | Discharge: 2021-01-21 | Disposition: A | Discharge status: Home / Self Care | Payer: 59 | Source: Home / Self Care | Attending: Emergency Medicine | Admitting: Emergency Medicine

## 2021-01-21 ENCOUNTER — Emergency Department (HOSPITAL_COMMUNITY)
Admission: EM | Admit: 2021-01-21 | Discharge: 2021-01-22 | Disposition: A | Discharge status: Home / Self Care | Payer: 59 | Attending: Emergency Medicine | Admitting: Emergency Medicine

## 2021-01-21 ENCOUNTER — Other Ambulatory Visit: Payer: Self-pay

## 2021-01-21 DIAGNOSIS — Z79899 Other long term (current) drug therapy: Secondary | ICD-10-CM | POA: Insufficient documentation

## 2021-01-21 DIAGNOSIS — Y900 Blood alcohol level of less than 20 mg/100 ml: Secondary | ICD-10-CM | POA: Diagnosis not present

## 2021-01-21 DIAGNOSIS — F3481 Disruptive mood dysregulation disorder: Secondary | ICD-10-CM | POA: Insufficient documentation

## 2021-01-21 DIAGNOSIS — F209 Schizophrenia, unspecified: Secondary | ICD-10-CM | POA: Insufficient documentation

## 2021-01-21 DIAGNOSIS — R4689 Other symptoms and signs involving appearance and behavior: Secondary | ICD-10-CM

## 2021-01-21 DIAGNOSIS — F22 Delusional disorders: Secondary | ICD-10-CM | POA: Diagnosis not present

## 2021-01-21 DIAGNOSIS — R456 Violent behavior: Secondary | ICD-10-CM | POA: Insufficient documentation

## 2021-01-21 LAB — URINALYSIS, ROUTINE W REFLEX MICROSCOPIC
Bilirubin Urine: NEGATIVE
Bilirubin Urine: NEGATIVE
Glucose, UA: NEGATIVE mg/dL
Glucose, UA: NEGATIVE mg/dL
Hgb urine dipstick: NEGATIVE
Hgb urine dipstick: NEGATIVE
Ketones, ur: NEGATIVE mg/dL
Ketones, ur: NEGATIVE mg/dL
Leukocytes,Ua: NEGATIVE
Leukocytes,Ua: NEGATIVE
Nitrite: NEGATIVE
Nitrite: NEGATIVE
Protein, ur: NEGATIVE mg/dL
Protein, ur: NEGATIVE mg/dL
Specific Gravity, Urine: 1.001 — ABNORMAL LOW (ref 1.005–1.030)
Specific Gravity, Urine: 1.011 (ref 1.005–1.030)
pH: 7 (ref 5.0–8.0)
pH: 7 (ref 5.0–8.0)

## 2021-01-21 LAB — RAPID URINE DRUG SCREEN, HOSP PERFORMED
Amphetamines: NOT DETECTED
Barbiturates: NOT DETECTED
Benzodiazepines: POSITIVE — AB
Cocaine: NOT DETECTED
Opiates: NOT DETECTED
Tetrahydrocannabinol: NOT DETECTED

## 2021-01-21 LAB — CBC WITH DIFFERENTIAL/PLATELET
Abs Immature Granulocytes: 0.01 10*3/uL (ref 0.00–0.07)
Basophils Absolute: 0 10*3/uL (ref 0.0–0.1)
Basophils Relative: 0 %
Eosinophils Absolute: 0.1 10*3/uL (ref 0.0–0.5)
Eosinophils Relative: 2 %
HCT: 39.7 % (ref 39.0–52.0)
Hemoglobin: 13.5 g/dL (ref 13.0–17.0)
Immature Granulocytes: 0 %
Lymphocytes Relative: 36 %
Lymphs Abs: 1.8 10*3/uL (ref 0.7–4.0)
MCH: 31.8 pg (ref 26.0–34.0)
MCHC: 34 g/dL (ref 30.0–36.0)
MCV: 93.6 fL (ref 80.0–100.0)
Monocytes Absolute: 0.5 10*3/uL (ref 0.1–1.0)
Monocytes Relative: 10 %
Neutro Abs: 2.6 10*3/uL (ref 1.7–7.7)
Neutrophils Relative %: 52 %
Platelets: 213 10*3/uL (ref 150–400)
RBC: 4.24 MIL/uL (ref 4.22–5.81)
RDW: 14.7 % (ref 11.5–15.5)
WBC: 5 10*3/uL (ref 4.0–10.5)
nRBC: 0 % (ref 0.0–0.2)

## 2021-01-21 LAB — COMPREHENSIVE METABOLIC PANEL
ALT: 43 U/L (ref 0–44)
AST: 51 U/L — ABNORMAL HIGH (ref 15–41)
Albumin: 4.1 g/dL (ref 3.5–5.0)
Alkaline Phosphatase: 61 U/L (ref 38–126)
Anion gap: 7 (ref 5–15)
BUN: 15 mg/dL (ref 6–20)
CO2: 26 mmol/L (ref 22–32)
Calcium: 9.6 mg/dL (ref 8.9–10.3)
Chloride: 107 mmol/L (ref 98–111)
Creatinine, Ser: 0.89 mg/dL (ref 0.61–1.24)
GFR, Estimated: >60 mL/min (ref >60)
Glucose, Bld: 80 mg/dL (ref 70–99)
Potassium: 4 mmol/L (ref 3.5–5.1)
Sodium: 140 mmol/L (ref 135–145)
Total Bilirubin: 0.8 mg/dL (ref 0.3–1.2)
Total Protein: 8 g/dL (ref 6.5–8.1)

## 2021-01-21 LAB — SALICYLATE LEVEL: Salicylate Lvl: <7 mg/dL — ABNORMAL LOW (ref 7.0–30.0)

## 2021-01-21 LAB — ETHANOL: Alcohol, Ethyl (B): <10 mg/dL (ref <10)

## 2021-01-21 LAB — ACETAMINOPHEN LEVEL: Acetaminophen (Tylenol), Serum: <10 ug/mL — ABNORMAL LOW (ref 10–30)

## 2021-01-21 MED ORDER — ACETAMINOPHEN 325 MG PO TABS: 650.0000 mg | ORAL_TABLET | ORAL | Status: DC | PRN

## 2021-01-21 MED ORDER — METFORMIN HCL 500 MG PO TABS
500.0000 mg | ORAL_TABLET | Freq: Two times a day (BID) | ORAL | Status: DC
  Administered 2021-01-21: 500 mg via ORAL
  Filled 2021-01-21: qty 1

## 2021-01-21 NOTE — ED Notes (Signed)
Pt phone was given to mother per michelle

## 2021-01-21 NOTE — ED Provider Notes (Signed)
[facility]-EMERGENCY DEPT Provider Note   CSN: [id number] Arrival date & time: 01/21/21  1534     History Chief Complaint  Patient presents with   Medical Clearance    Noah Ramsey is a 25 y.o. male with a past medical history significant for schizoaffective disorder who presents to the ED from [facility] due to aggressive behavior.  Per [facility] note, patient has been demonstrating aggressive behavior at home with his stepfather.  Patient has been breaking numerous things.  Chart reviewed.  Patient recently admitted to the hospital on 8/14-9/16 due to suicidal/homicidal ideations and auditory hallucinations.Patient was recently started on Ivega Sustenna IM during his recent admission. Next dose 10/2. Difficult to obtain HPI due to tangential speech. Patient notes he is here due to frequent urination for 7 years because he is "overweight". 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 Patient was sent to the ED from [facility] for medical clearance to re-assessment in the morning by psychiatry.   Level 5 caveat secondary to psychiatric illness  History obtained from patient, [facility], and past medical records. No interpreter used during encounter.       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     Family History  Problem Relation Age of Onset   Mental illness Brother     Social History   Tobacco Use   Smoking status: Never    Smokeless tobacco: Never  Substance Use Topics   Alcohol use: Never   Drug use: Never    Home Medications Prior to Admission medications   Medication Sig Start Date End Date Taking? Authorizing Provider  benztropine (COGENTIN) 0.5 MG tablet Take 1 tablet (0.5 mg total) by mouth every 6 (six) hours as needed for tremors. 01/05/21   Armandina Stammer I, NP  doxepin (SINEQUAN) 10 MG capsule Take 1 capsule (10 mg total) by mouth at bedtime. For sleep/anxiety 01/05/21   Armandina Stammer I, NP  gabapentin (NEURONTIN) 300 MG capsule Take 1 capsule (300 mg total) by mouth at bedtime. For agitation 01/05/21   Armandina Stammer I, NP  hydrOXYzine (ATARAX/VISTARIL) 25 MG tablet Take 1 tablet (25 mg total) by mouth every 6 (six) hours as needed for anxiety. 01/05/21   Armandina Stammer I, NP  metFORMIN (GLUCOPHAGE) 500 MG tablet Take 1 tablet (500 mg total) by mouth 2 (two) times daily with a meal. :Medication induced wt management 01/05/21   Armandina Stammer I, NP  paliperidone (INVEGA SUSTENNA) 234 MG/1.5ML SUSY injection Inject 234 mg into the muscle every 28 (twenty-eight) days. Due on 01-21-21: For mood control 01/21/21   Nwoko, Nicole Kindred I, NP  polyethylene glycol (MIRALAX / GLYCOLAX) 17 g packet Take 17 g by mouth daily. For constipation 01/06/21   Armandina Stammer I, NP  propranolol (INDERAL) 40 MG tablet Take 1 tablet (40 mg total) by mouth every 8 (eight) hours. For anxiety 01/05/21   Sanjuana Kava, NP  risperiDONE (RISPERDAL) 2 MG tablet Take 1 tablet (2 mg total) by mouth 2 (two) times daily. For mood control 01/05/21   Sanjuana Kava, NP    Allergies    Patient has no known allergies.  Review of Systems   Review of Systems  Genitourinary:  Positive for frequency.  Psychiatric/Behavioral:  Positive for behavioral problems and hallucinations.   All other systems reviewed and are negative.  Physical Exam Updated Vital Signs BP 116/84 (BP Location: Right Arm)   Pulse 85   Temp 98.5 F (36.9 C) (Oral)   Resp 18   Ht 5\' 7"   (1.702 m)   Wt (!) 142.9 kg   SpO2 99%   BMI 49.34 kg/m   Physical Exam Vitals and nursing note reviewed.  Constitutional:      General: He is not in acute distress.    Appearance: He is not ill-appearing.  HENT:     Head: Normocephalic.  Eyes:     Pupils: Pupils are equal, round, and reactive to light.  Cardiovascular:     Rate and Rhythm: Normal rate and regular rhythm.     Pulses: Normal pulses.     Heart sounds: Normal heart sounds. No murmur heard.   No friction rub. No gallop.  Pulmonary:     Effort: Pulmonary effort is normal.     Breath sounds: Normal breath sounds.  Abdominal:     General: Abdomen is flat. There is no distension.     Palpations: Abdomen is soft.     Tenderness: There is no abdominal tenderness. There is no guarding or rebound.  Musculoskeletal:        General: Normal range of motion.     Cervical back: Neck supple.  Skin:    General: Skin is warm and dry.  Neurological:     General: No focal deficit present.     Mental Status: He is alert.  Psychiatric:        Mood and Affect: Mood normal.        Speech: Speech is tangential.        Behavior: Behavior is withdrawn.        Thought Content: Thought content is delusional. Thought content does not include suicidal ideation. Thought content does not include suicidal plan.    ED Results / Procedures / Treatments   Labs (all labs ordered are listed, but only abnormal results are displayed) Labs Reviewed  COMPREHENSIVE METABOLIC PANEL - Abnormal; Notable for the following components:      Result Value   AST 51 (*)    All other components within normal limits  ACETAMINOPHEN LEVEL - Abnormal; Notable for the following components:   Acetaminophen (Tylenol), Serum <10 (*)    All other components within normal limits  SALICYLATE LEVEL - Abnormal; Notable for the following components:   Salicylate Lvl <7.0 (*)    All other components within normal limits  URINALYSIS, ROUTINE W REFLEX MICROSCOPIC -  Abnormal; Notable for the following components:   Color, Urine COLORLESS (*)    Specific Gravity, Urine 1.001 (*)    All other components within normal limits  ETHANOL  CBC WITH DIFFERENTIAL/PLATELET  RAPID URINE DRUG SCREEN, HOSP PERFORMED    EKG None  Radiology No results found.  Procedures Procedures   Medications Ordered in ED Medications  acetaminophen (TYLENOL) tablet 650 mg (has no administration in time range)  metFORMIN (GLUCOPHAGE) tablet 500 mg (has no administration in time range)    ED Course  I  have reviewed the triage vital signs and the nursing notes.  Pertinent labs & imaging results that were available during my care of the patient were reviewed by me and considered in my medical decision making (see chart for details).  Clinical Course as of 01/21/21 1723  Sun Jan 21, 2021  1717 Salicylate Lvl(!): <7.0 [CA]  1717 Alcohol, Ethyl (B): <10 [CA]  1717 Acetaminophen (Tylenol), S(!): <10 [CA]    Clinical Course User Index [CA] Mannie Stabile, PA-C   MDM Rules/Calculators/A&P                          25 year old male with history of schizoaffective disorder presents to the ED from [facility] for medical clearance.  Per mother, patient has been demonstrating aggressive behavior.  Had a recent admission for over a month her medications were changed.  Patient has tangential speech during initial evaluation and hard to obtain HPI.  Patient denies HI and SI.  He admits to visual hallucinations due to his "astigmatism".  Upon arrival, vitals all within normal limits.  Patient in no acute distress.  Patient also endorses frequent urination "for 7 years".  Medical clearance labs ordered.  UA to rule out UTI.   CBC unremarkable no leukocytosis and normal hemoglobin.  UA negative for signs of infection.  CMP reassuring.  Normal renal function.  Mild elevation AST of 51.  No major electrolyte derangements.  Ethanol, salicylate, acetaminophen level within normal limits.   Patient has been medically cleared for TTS evaluation.  The patient has been placed in psychiatric observation due to the need to provide a safe environment for the patient while obtaining psychiatric consultation and evaluation, as well as ongoing medical and medication management to treat the patient's condition.  The patient has not been placed under full IVC at this time. Final Clinical Impression(s) / ED Diagnoses Final diagnoses:  Aggressive behavior    Rx / DC Orders ED Discharge Orders     None        Jesusita Oka 01/21/21 1723    Terald Sleeper, MD 01/21/21 [phone]

## 2021-01-21 NOTE — H&P (Signed)
Behavioral Health Medical Screening Exam  Noah Ramsey is an 25 y.o. male. Patient presented to [facility] as a voluntary walk-in accompanied by his mother. Patient is diagnosed with Schizophrenia. His mother stated he has been aggressive at home with his step-father and has been breaking things. Patient was recently hospitalized at [facility] from 8-14 to 39-16 and was started on [place] IM long acting injectable, his next dose was due on 10/2.  He was referred to [facility] for medication management, first appointment was on 9/17. His mother stated he has seen Dr Maggie Schwalbe 2 times since he was discharged form [facility].  He was seen there on Friday 9/30. According to his mother, while at [facility], [place] received Haldol Decanoate  100 mg LAI on Friday and was given haldol tablets. Patient was referred to [facility] and according to his mother they have been in touch with them, she is unsure when services will start. She stated they called on Friday and spoke with Imer but she was not looped into the conversation. Patient's mother was strongly encouraged to seek guardianship for her son so she will not be left out of these type of conversations in the future.  Patient is tangential with loose associations. He has many delusions and will say he is a drug addict, a steroid addict, and "[place]". He often requires redirection to stay on topic and can be very grandiose. He does not like to take pills and will opt for injections, the reason he was started on Invega LAI. He can have a a tendency to be sexually inappropriate. He was sent to [facility] for medical clearance and re-assessment in the morning by psychiatry for appropriate placement. There are no appropriate beds at [facility].    Total Time spent with patient: 30 minutes  Psychiatric Specialty Exam: Physical Exam Vitals reviewed.  Constitutional:      Appearance: Normal appearance.  HENT:     Head: Normocephalic and atraumatic.  Pulmonary:      Effort: Pulmonary effort is normal.  Musculoskeletal:        General: Normal range of motion.     Cervical back: Normal range of motion.  Neurological:     General: No focal deficit present.     Mental Status: He is alert and oriented to person, place, and time.  Psychiatric:        Attention and Perception: Attention normal.        Mood and Affect: Mood normal.        Speech: Speech is tangential.        Behavior: Behavior normal.        Thought Content: Thought content is delusional. Thought content does not include homicidal or suicidal ideation. Thought content does not include homicidal or suicidal plan.        Cognition and Memory: Cognition normal.        Judgment: Judgment is impulsive.   Review of Systems  Constitutional: Negative.   HENT:  Negative for congestion and sore throat.   Respiratory: Negative.  Negative for cough.   Cardiovascular: Negative.   Gastrointestinal: Negative.   Genitourinary: Negative.   Musculoskeletal: Negative.   Neurological: Negative.    Blood pressure 120/72, pulse 88, temperature 98.1 F (36.7 C), temperature source Oral, resp. rate 18, SpO2 100 %.There is no height or weight on file to calculate BMI. General Appearance: Casual and Fairly Groomed Eye Contact:  Fair Speech:  Clear and Coherent Volume:  Normal Mood:  Euthymic  Affect:  Constricted Thought Process:  Descriptions of Associations: Loose Orientation:  Full (Time, Place, and Person) Thought Content:  Tangential Suicidal Thoughts:  No Homicidal Thoughts:  No Memory:  Immediate;   Fair Recent;   Fair Remote;   Fair Judgement:  Impaired Insight:  Shallow Psychomotor Activity:  Normal Concentration: Concentration: Fair and Attention Span: Fair Recall:  [employer] of Knowledge:Fair Language: Good Akathisia:  No Handed:  Right AIMS (if indicated):    Assets:  Architect Housing Resilience Social Support Sleep:      Musculoskeletal: Strength & Muscle Tone: within normal limits Gait & Station: normal Patient leans: N/A  Blood pressure 120/72, pulse 88, temperature 98.1 F (36.7 C), temperature source Oral, resp. rate 18, SpO2 100 %.  Recommendations: Based on my evaluation the patient does not appear to have an emergency medical condition.  Laveda Abbe, NP 01/21/2021, 4:14 PM

## 2021-01-21 NOTE — ED Notes (Signed)
Pt dressed in purple scrubs and belongings are in pt belonging bags and labeled
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
## 2021-01-21 NOTE — ED Triage Notes (Addendum)
Pt sent here from [facility] for medical clearance. Pt states he feels like he is blacking out, having frequent urination and sleeping a lot.States he is not sure why his mom took him to [facility]. States these symptoms have been happening since he was 16.

## 2021-01-22 ENCOUNTER — Emergency Department (HOSPITAL_COMMUNITY)
Admission: EM | Admit: 2021-01-22 | Discharge: 2021-01-22 | Disposition: A | Discharge status: Home / Self Care | Payer: 59 | Attending: Emergency Medicine | Admitting: Emergency Medicine

## 2021-01-22 ENCOUNTER — Emergency Department (HOSPITAL_COMMUNITY)
Admission: EM | Admit: 2021-01-22 | Discharge: 2021-01-22 | Discharge status: Against Medical Advice | Payer: 59 | Source: Home / Self Care | Attending: Emergency Medicine | Admitting: Emergency Medicine

## 2021-01-22 ENCOUNTER — Encounter (HOSPITAL_COMMUNITY): Payer: Self-pay | Admitting: *Deleted

## 2021-01-22 ENCOUNTER — Emergency Department (HOSPITAL_COMMUNITY)
Admission: EM | Admit: 2021-01-22 | Discharge: 2021-01-23 | Disposition: A | Discharge status: Home / Self Care | Payer: 59 | Source: Home / Self Care

## 2021-01-22 ENCOUNTER — Other Ambulatory Visit: Payer: Self-pay

## 2021-01-22 ENCOUNTER — Encounter (HOSPITAL_COMMUNITY): Payer: Self-pay

## 2021-01-22 DIAGNOSIS — R440 Auditory hallucinations: Secondary | ICD-10-CM | POA: Insufficient documentation

## 2021-01-22 DIAGNOSIS — Z79899 Other long term (current) drug therapy: Secondary | ICD-10-CM | POA: Diagnosis not present

## 2021-01-22 DIAGNOSIS — R441 Visual hallucinations: Secondary | ICD-10-CM | POA: Insufficient documentation

## 2021-01-22 DIAGNOSIS — Z0000 Encounter for general adult medical examination without abnormal findings: Secondary | ICD-10-CM | POA: Insufficient documentation

## 2021-01-22 DIAGNOSIS — Z5321 Procedure and treatment not carried out due to patient leaving prior to being seen by health care provider: Secondary | ICD-10-CM | POA: Insufficient documentation

## 2021-01-22 DIAGNOSIS — M79604 Pain in right leg: Secondary | ICD-10-CM | POA: Insufficient documentation

## 2021-01-22 DIAGNOSIS — R456 Violent behavior: Secondary | ICD-10-CM | POA: Insufficient documentation

## 2021-01-22 DIAGNOSIS — F22 Delusional disorders: Secondary | ICD-10-CM

## 2021-01-22 DIAGNOSIS — M79605 Pain in left leg: Secondary | ICD-10-CM | POA: Insufficient documentation

## 2021-01-22 LAB — CBC
HCT: 42.7 % (ref 39.0–52.0)
Hemoglobin: 14.6 g/dL (ref 13.0–17.0)
MCH: 31.7 pg (ref 26.0–34.0)
MCHC: 34.2 g/dL (ref 30.0–36.0)
MCV: 92.6 fL (ref 80.0–100.0)
Platelets: 223 10*3/uL (ref 150–400)
RBC: 4.61 MIL/uL (ref 4.22–5.81)
RDW: 14.4 % (ref 11.5–15.5)
WBC: 6.2 10*3/uL (ref 4.0–10.5)
nRBC: 0 % (ref 0.0–0.2)

## 2021-01-22 NOTE — ED Notes (Signed)
Pt eating meal tray 

## 2021-01-22 NOTE — ED Provider Notes (Addendum)
Patient is asking to be discharged.  He is not acutely agitated, suicidal, homicidal.  He does have some chronic psychiatric issues but he has been calm since being in the emergency department.  According to psychiatric note, he was psychiatrically cleared but recommended to be observed overnight.  However at this point he states he wants to go home.  He has called his mom and talk to his mom who is willing to take him home tonight.  At this point I do not think we can keep him against as well as there is no criteria for involuntary commitment at this point.  He is alert and oriented to person, place, time, situation. Will discharge home with return precautions.   Pricilla Loveless, MD 01/22/21 [id number]    Pricilla Loveless, MD 01/22/21 [phone]

## 2021-01-22 NOTE — ED Notes (Signed)
Pt asking for a sandwich again. I told him we are not doing anymore sandwiches and the dinner trays will be around shortly. Pt given water. Denies further needs.

## 2021-01-22 NOTE — ED Notes (Signed)
Pt eloped through [facility]. Dr. Stevie Kern aware. Pt not SI or HI and isn't IVC.

## 2021-01-22 NOTE — ED Notes (Signed)
I introduced myself to pt. Pt oriented x4. Pt given a sandwich. Denies pain. NAD noted. Pt ambulatory around stretcher bed.

## 2021-01-22 NOTE — ED Triage Notes (Signed)
Patient here after leaving [facility] twice earlier today, once at 0200 and once at 0500. Patient now states he "walked 30 miles today and now I think I have rhabdomyolysis". Patient denies SI/HI and is calm and cooperative at this time.

## 2021-01-22 NOTE — ED Provider Notes (Signed)
Brief update note  Patient was seen by PA Smoot earlier today with concern for delusions.  Denied SI HI.  I evaluated patient while he was in ER and appeared calm.  No threatening towards others or himself.  Given the delusions, TTS was consulted.  While awaiting TTS consult, patient grew impatient and reported that he was unwilling to wait any longer.  As patient has not exhibited clear danger to himself or others, not acutely agitated, do not believe we have reason to IVC patient at this time.  Patient allowed to leave before treatment complete at his choice.  Similar conversation and experience by Dr. Criss Alvine earlier today.    Milagros Loll, MD 01/22/21 1958

## 2021-01-22 NOTE — ED Triage Notes (Signed)
Pt from lobby, states he concerned he has been "acting up" at home requesting help. Denies any SI/HI at this time. Hx of schizophrenia. Was just discharged.

## 2021-01-22 NOTE — ED Notes (Signed)
Pt asking for another sandwich, but I told him he's already had 3 and dinner will be arriving soon so he needs to wait for the dinner tray. Pt said he is on a "special diet of just bread and sandwich meat". I asked him why and he said it's so "I don't get too big". I told pt he can pick and choose what he wants on the dinner tray, but we aren't doing anymore sandwiches right now. Pt agreeable to plan. Otherwise denies further needs.

## 2021-01-22 NOTE — ED Notes (Signed)
Pt coming back and still asking me to lie in my charting. Says he wants to go to the mental hospital because it's "basically a five start hotel".

## 2021-01-22 NOTE — ED Notes (Signed)
Patient requesting discharge. MD Criss Alvine made aware and to speak with patient

## 2021-01-22 NOTE — ED Notes (Signed)
Patient called and spoke to mother. Per patient, mother is going to come and get him

## 2021-01-22 NOTE — ED Provider Notes (Signed)
[facility] EMERGENCY DEPARTMENT Provider Note   CSN: [id number] Arrival date & time: 01/22/21  3710     History Chief Complaint  Patient presents with   Delusional    Noah Ramsey is a 25 y.o. male.  Patient with history of schizoaffective disorder presents today with delusions.  States that "goblins and war lords are on your shoulder and are telling me secrets." Patient is unable to elaborate. Of note, this is the patients third ER visit in the past 24 hours. In triage he was complaining of symptoms related to rhabdomyolysis, denies symptoms now. Denies SI/HI.  The history is provided by the patient. No language interpreter was used.      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     Family History  Problem Relation Age of Onset   Mental illness Brother     Social History   Tobacco Use   Smoking status: Never   Smokeless tobacco: Never  Substance Use Topics   Alcohol use: Never   Drug use: Never    Home Medications Prior to Admission medications   Medication Sig Start Date End Date Taking? Authorizing Provider  benztropine (COGENTIN) 0.5 MG tablet Take 1 tablet (0.5 mg total) by mouth every 6 (six) hours as needed for tremors. Patient not taking: No sig reported 01/05/21   Armandina Stammer I, NP  diazepam (VALIUM) 5 MG tablet Take 5 mg by mouth every morning.    [provider]  doxepin (SINEQUAN) 10 MG capsule Take 1 capsule (10 mg  total) by mouth at bedtime. For sleep/anxiety Patient not taking: No sig reported 01/05/21   Armandina Stammer I, NP  gabapentin (NEURONTIN) 300 MG capsule Take 1 capsule (300 mg total) by mouth at bedtime. For agitation Patient not taking: No sig reported 01/05/21   Armandina Stammer I, NP  haloperidol (HALDOL) 10 MG tablet Take 10 mg by mouth See admin instructions. Take one tablet (10 mg) by mouth nightly until 01/26/21 to give injection time to take effect 01/12/21   [provider]  haloperidol decanoate (HALDOL DECANOATE) 100 MG/ML injection Inject 100 mg into the muscle every 30 (thirty) days. 01/15/21   [provider]  hydrOXYzine (ATARAX/VISTARIL) 25 MG tablet Take 1 tablet (25 mg total) by mouth every 6 (six) hours as needed for anxiety. Patient not taking: No sig reported 01/05/21   Armandina Stammer I, NP  metFORMIN (GLUCOPHAGE) 500 MG tablet Take 1 tablet (500 mg total) by mouth 2 (two) times daily with a meal. :Medication induced wt management 01/05/21   Armandina Stammer I, NP  paliperidone (INVEGA SUSTENNA) 234 MG/1.5ML SUSY injection Inject 234 mg into the muscle every 28 (twenty-eight) days. Due on 01-21-21: For mood control Patient not taking: No sig reported 01/21/21   Armandina Stammer I, NP  polyethylene glycol (MIRALAX / GLYCOLAX) 17 g packet Take 17 g by mouth daily. For constipation Patient not taking: No sig reported 01/06/21  Armandina Stammer I, NP  propranolol (INDERAL) 40 MG tablet Take 1 tablet (40 mg total) by mouth every 8 (eight) hours. For anxiety Patient taking differently: Take 40 mg by mouth 2 (two) times daily. For anxiety 01/05/21   Armandina Stammer I, NP  risperiDONE (RISPERDAL) 2 MG tablet Take 1 tablet (2 mg total) by mouth 2 (two) times daily. For mood control Patient not taking: Reported on 01/21/2021 01/05/21   Armandina Stammer I, NP    Allergies    Patient has no known allergies.  Review of Systems   Review of Systems  Constitutional:  Negative for chills, fatigue and fever.   Respiratory:  Negative for cough and shortness of breath.   Cardiovascular:  Negative for chest pain and palpitations.  Gastrointestinal:  Negative for abdominal distention, abdominal pain, diarrhea, nausea and vomiting.  Musculoskeletal:  Negative for gait problem.  Neurological:  Negative for dizziness, tremors, seizures, syncope, facial asymmetry, speech difficulty, light-headedness, numbness and headaches.  Psychiatric/Behavioral:  Positive for hallucinations. Negative for confusion, decreased concentration and suicidal ideas.   All other systems reviewed and are negative.  Physical Exam Updated Vital Signs BP 122/77 (BP Location: Right Arm)   Pulse 89   Temp 98.6 F (37 C) (Oral)   Resp 16   SpO2 100%   Physical Exam Vitals and nursing note reviewed.  Constitutional:      General: He is not in acute distress.    Appearance: Normal appearance. He is normal weight. He is not ill-appearing, toxic-appearing or diaphoretic.  HENT:     Head: Normocephalic and atraumatic.  Cardiovascular:     Rate and Rhythm: Normal rate.  Pulmonary:     Effort: Pulmonary effort is normal. No respiratory distress.  Musculoskeletal:        General: Normal range of motion.     Cervical back: Normal range of motion.  Skin:    General: Skin is warm and dry.  Neurological:     Mental Status: He is alert. Mental status is at baseline.  Psychiatric:        Attention and Perception: He perceives auditory and visual hallucinations.        Thought Content: Thought content is paranoid and delusional. Thought content does not include homicidal or suicidal ideation. Thought content does not include homicidal or suicidal plan.        Judgment: Judgment is inappropriate.    ED Results / Procedures / Treatments   Labs (all labs ordered are listed, but only abnormal results are displayed) Labs Reviewed - No data to display  EKG None  Radiology No results found.  Procedures Procedures    Medications Ordered in ED Medications - No data to display  ED Course  I have reviewed the triage vital signs and the nursing notes.  Pertinent labs & imaging results that were available during my care of the patient were reviewed by me and considered in my medical decision making (see chart for details).    MDM Rules/Calculators/A&P                         Patient presents with visual and auditory hallucinations.  Has undergone initial ER evaluation and left without being seen several times in the last 24 hours.  Full set of medical clearance labs drawn less than 24 hours ago.  Patient's status has been unchanged during that time, therefore patient is medically cleared for TTS consult.  Patient routinely requesting to be discharged.  I have so far successfully been able to redirect the patient to stay, he would like to be placed in inpatient facility for routine monitoring of his psychiatric condition.  He is not acutely agitated, suicidal, or homicidal.  Does have chronic psychiatric issues, but has been calm since being in the emergency department this afternoon.  I do not feel that he meets criteria for involuntary commitment at this time.  We will continue to redirect in hopes that TTS can see him for assessment of further management of his condition.  Care assumed by Dr. Stevie Kern at shift change, awaiting TTS consult for further management of care at this time.    Final Clinical Impression(s) / ED Diagnoses Final diagnoses:  None    Rx / DC Orders ED Discharge Orders     None        [name] 01/22/21 Townsend Roger, MD 01/27/21 1155

## 2021-01-22 NOTE — ED Provider Notes (Signed)
Emergency Medicine Provider Triage Evaluation Note  Noah Ramsey , a 25 y.o. male  was evaluated in triage.  Pt complains of delusions.  States that he has been walking however [place] since he left [facility] this morning.  States he has been seeing "goblins and war lords."  Is concerned that he has rhabdomyolysis, however he is unable to explain why he feels this way and is ambulatory without difficulty and has no musculoskeletal complaints.  Denies SI/HI.   Review of Systems  Positive: Visual hallucinations Negative: Fevers, chills, muscle pain, weakness  Physical Exam  BP 139/88 (BP Location: Right Arm)   Pulse (!) 103   Temp 98.6 F (37 C) (Oral)   Resp 17   SpO2 99%  Gen:   Awake, no distress   Resp:  Normal effort  MSK:   Moves extremities without difficulty    Medical Decision Making  Medically screening exam initiated at 9:39 AM.  Appropriate orders placed.  Noah Ramsey was informed that the remainder of the evaluation will be completed by another provider, this initial triage assessment does not replace that evaluation, and the importance of remaining in the ED until their evaluation is complete.  Medical clearance labs ordered less than 24 hours ago without significant findings.  UDS positive for benzos.   [name] 01/22/21 1610    Bethann Berkshire, MD 01/27/21 1154

## 2021-01-22 NOTE — ED Notes (Signed)
Pt given a sandwich bag  

## 2021-01-22 NOTE — ED Notes (Signed)
Pt asking for a shower now. I told him we don't have that capability where we are currently in the ER. He said he's interested in being d/c'd so he can go to another facility. I told him I can speak to the ED about d/c'ing him (pt going AMA). He said he wants to check into a mental facility. I told him that's why we're waiting on TTS to evaluate him because they are the ones that decide what's going to happen with him. Pt then repeatedly asked me to lie and chart that he tried to punch me (he didn't) and chart that he tried to kill himself (he didn't). I told him that's against the law and I absolutely will not do that. ED doctor made aware.

## 2021-01-22 NOTE — ED Notes (Signed)
Pt appears to be asleep. NAD noted. 

## 2021-01-22 NOTE — ED Notes (Signed)
[place], MD at bedside.  

## 2021-01-22 NOTE — ED Triage Notes (Signed)
Pt reports auditory and visual hallucinations. Pt c/o bilateral leg pain. Currently asking what year it is, denies SI. Pt recently treated and discharged from [facility]

## 2021-01-23 LAB — COMPREHENSIVE METABOLIC PANEL
ALT: 42 U/L (ref 0–44)
AST: 52 U/L — ABNORMAL HIGH (ref 15–41)
Albumin: 3.9 g/dL (ref 3.5–5.0)
Alkaline Phosphatase: 59 U/L (ref 38–126)
Anion gap: 10 (ref 5–15)
BUN: 16 mg/dL (ref 6–20)
CO2: 23 mmol/L (ref 22–32)
Calcium: 9.8 mg/dL (ref 8.9–10.3)
Chloride: 103 mmol/L (ref 98–111)
Creatinine, Ser: 0.95 mg/dL (ref 0.61–1.24)
GFR, Estimated: >60 mL/min (ref >60)
Glucose, Bld: 94 mg/dL (ref 70–99)
Potassium: 3.6 mmol/L (ref 3.5–5.1)
Sodium: 136 mmol/L (ref 135–145)
Total Bilirubin: 0.9 mg/dL (ref 0.3–1.2)
Total Protein: 7.7 g/dL (ref 6.5–8.1)

## 2021-01-23 LAB — ETHANOL: Alcohol, Ethyl (B): <10 mg/dL (ref <10)

## 2021-01-23 LAB — RAPID URINE DRUG SCREEN, HOSP PERFORMED
Amphetamines: NOT DETECTED
Barbiturates: NOT DETECTED
Benzodiazepines: POSITIVE — AB
Cocaine: NOT DETECTED
Opiates: NOT DETECTED
Tetrahydrocannabinol: NOT DETECTED

## 2021-01-23 NOTE — ED Notes (Signed)
Patient on the phone trying to call his mother for a ride home

## 2021-01-23 NOTE — ED Notes (Signed)
Called pt 3x no response °

## 2021-01-23 NOTE — ED Notes (Signed)
Pt seen wondering the hallways. Brought back by security. Patient not in lobby anymore at this time. Security updated and aware

## 2021-01-24 ENCOUNTER — Telehealth: Payer: Self-pay

## 2021-01-24 NOTE — Telephone Encounter (Signed)
Transition Care Management Follow-up Telephone Call Date of discharge and from where: 01/23/2021[facility] How have you been since you were released from the hospital? Patient [facility] but said he's doing fine. Any questions or concerns? No

## 2021-01-26 ENCOUNTER — Other Ambulatory Visit: Payer: Self-pay

## 2021-01-26 ENCOUNTER — Encounter (HOSPITAL_COMMUNITY): Payer: Self-pay | Admitting: *Deleted

## 2021-01-26 ENCOUNTER — Emergency Department (HOSPITAL_COMMUNITY)
Admission: EM | Admit: 2021-01-26 | Discharge: 2021-01-29 | Disposition: A | Discharge status: Home / Self Care | Payer: 59 | Attending: Emergency Medicine | Admitting: Emergency Medicine

## 2021-01-26 DIAGNOSIS — F29 Unspecified psychosis not due to a substance or known physiological condition: Secondary | ICD-10-CM | POA: Diagnosis not present

## 2021-01-26 DIAGNOSIS — Z1339 Encounter for screening examination for other mental health and behavioral disorders: Secondary | ICD-10-CM | POA: Insufficient documentation

## 2021-01-26 DIAGNOSIS — F259 Schizoaffective disorder, unspecified: Secondary | ICD-10-CM | POA: Diagnosis not present

## 2021-01-26 DIAGNOSIS — Z8616 Personal history of COVID-19: Secondary | ICD-10-CM | POA: Insufficient documentation

## 2021-01-26 DIAGNOSIS — Z7984 Long term (current) use of oral hypoglycemic drugs: Secondary | ICD-10-CM | POA: Diagnosis not present

## 2021-01-26 DIAGNOSIS — R000 Tachycardia, unspecified: Secondary | ICD-10-CM | POA: Diagnosis not present

## 2021-01-26 DIAGNOSIS — F209 Schizophrenia, unspecified: Secondary | ICD-10-CM | POA: Insufficient documentation

## 2021-01-26 DIAGNOSIS — Z20822 Contact with and (suspected) exposure to covid-19: Secondary | ICD-10-CM | POA: Insufficient documentation

## 2021-01-26 DIAGNOSIS — Z79899 Other long term (current) drug therapy: Secondary | ICD-10-CM | POA: Diagnosis not present

## 2021-01-26 DIAGNOSIS — R0682 Tachypnea, not elsewhere classified: Secondary | ICD-10-CM | POA: Diagnosis not present

## 2021-01-26 LAB — COMPREHENSIVE METABOLIC PANEL
ALT: 35 U/L (ref 0–44)
AST: 53 U/L — ABNORMAL HIGH (ref 15–41)
Albumin: 3.9 g/dL (ref 3.5–5.0)
Alkaline Phosphatase: 63 U/L (ref 38–126)
Anion gap: 9 (ref 5–15)
BUN: 11 mg/dL (ref 6–20)
CO2: 24 mmol/L (ref 22–32)
Calcium: 9.2 mg/dL (ref 8.9–10.3)
Chloride: 105 mmol/L (ref 98–111)
Creatinine, Ser: 0.94 mg/dL (ref 0.61–1.24)
GFR, Estimated: >60 mL/min (ref >60)
Glucose, Bld: 104 mg/dL — ABNORMAL HIGH (ref 70–99)
Potassium: 3.9 mmol/L (ref 3.5–5.1)
Sodium: 138 mmol/L (ref 135–145)
Total Bilirubin: 0.7 mg/dL (ref 0.3–1.2)
Total Protein: 7.6 g/dL (ref 6.5–8.1)

## 2021-01-26 LAB — CBC WITH DIFFERENTIAL/PLATELET
Abs Immature Granulocytes: 0.02 10*3/uL (ref 0.00–0.07)
Basophils Absolute: 0 10*3/uL (ref 0.0–0.1)
Basophils Relative: 0 %
Eosinophils Absolute: 0.1 10*3/uL (ref 0.0–0.5)
Eosinophils Relative: 2 %
HCT: 41.2 % (ref 39.0–52.0)
Hemoglobin: 13.9 g/dL (ref 13.0–17.0)
Immature Granulocytes: 0 %
Lymphocytes Relative: 20 %
Lymphs Abs: 1.2 10*3/uL (ref 0.7–4.0)
MCH: 31.2 pg (ref 26.0–34.0)
MCHC: 33.7 g/dL (ref 30.0–36.0)
MCV: 92.6 fL (ref 80.0–100.0)
Monocytes Absolute: 0.5 10*3/uL (ref 0.1–1.0)
Monocytes Relative: 9 %
Neutro Abs: 4.1 10*3/uL (ref 1.7–7.7)
Neutrophils Relative %: 69 %
Platelets: 202 10*3/uL (ref 150–400)
RBC: 4.45 MIL/uL (ref 4.22–5.81)
RDW: 14 % (ref 11.5–15.5)
WBC: 5.9 10*3/uL (ref 4.0–10.5)
nRBC: 0 % (ref 0.0–0.2)

## 2021-01-26 LAB — RAPID URINE DRUG SCREEN, HOSP PERFORMED
Amphetamines: NOT DETECTED
Barbiturates: NOT DETECTED
Benzodiazepines: POSITIVE — AB
Cocaine: NOT DETECTED
Opiates: NOT DETECTED
Tetrahydrocannabinol: NOT DETECTED

## 2021-01-26 LAB — ETHANOL: Alcohol, Ethyl (B): <10 mg/dL (ref <10)

## 2021-01-26 MED ORDER — NICOTINE 21 MG/24HR TD PT24
21.0000 mg | MEDICATED_PATCH | Freq: Every day | TRANSDERMAL | Status: DC
Start: 2021-01-26 — End: 2021-01-30
  Administered 2021-01-27 – 2021-01-29 (×3): 21 mg via TRANSDERMAL
  Filled 2021-01-26 (×3): qty 1

## 2021-01-26 MED ORDER — ZIPRASIDONE MESYLATE 20 MG IM SOLR
20.0000 mg | INTRAMUSCULAR | Status: AC | PRN
  Administered 2021-01-27: 20 mg via INTRAMUSCULAR
  Filled 2021-01-26: qty 20

## 2021-01-26 MED ORDER — ALUM & MAG HYDROXIDE-SIMETH 200-200-20 MG/5ML PO SUSP: 30.0000 mL | Freq: Four times a day (QID) | ORAL | Status: DC | PRN

## 2021-01-26 MED ORDER — RISPERIDONE 1 MG PO TBDP
2.0000 mg | ORAL_TABLET | Freq: Three times a day (TID) | ORAL | Status: DC | PRN
  Filled 2021-01-26: qty 2

## 2021-01-26 MED ORDER — ZOLPIDEM TARTRATE 5 MG PO TABS: 5.0000 mg | ORAL_TABLET | Freq: Every evening | ORAL | Status: DC | PRN

## 2021-01-26 MED ORDER — ACETAMINOPHEN 325 MG PO TABS
650.0000 mg | ORAL_TABLET | ORAL | Status: DC | PRN
Start: 2021-01-26 — End: 2021-01-30

## 2021-01-26 MED ORDER — LORAZEPAM 1 MG PO TABS
1.0000 mg | ORAL_TABLET | ORAL | Status: DC | PRN
Start: 2021-01-26 — End: 2021-01-27

## 2021-01-26 NOTE — ED Provider Notes (Signed)
Emergency Medicine Provider Triage Evaluation Note  Noah Ramsey , a 25 y.o. male  was evaluated in triage.  Pt complains of psychiatric illness.  Review of Systems  Positive: Violent behaviors, non cooperative Negative: Level V caveats  Physical Exam  Ht 5\' 7"  (1.702 m)   Wt (!) 143.8 kg   BMI 49.65 kg/m  Gen:   Awake, no distress   Resp:  Normal effort  MSK:   Moves extremities without difficulty  Other:    Medical Decision Making  Medically screening exam initiated at 4:58 PM.  Appropriate orders placed.  Noah Ramsey was informed that the remainder of the evaluation will be completed by another provider, this initial triage assessment does not replace that evaluation, and the importance of remaining in the ED until their evaluation is complete.  Mom brought pt in because he has been behaving strangely, can be aggressive at time and she felt he is a threat to himself and to his family members.  He haven't been taking his psych meds.  Pt denies SI/HI.     [name], PA-C 01/26/21 1700    03/28/21, MD 01/26/21 [phone]

## 2021-01-26 NOTE — ED Triage Notes (Signed)
The pt has been having strnage behavior  he has been acting out for one week  he will not take his meds he has had some violent behavior   his mother has brought him in   he just went to [place]   Tuesday  his mother thinks he needs to be committed for his behavior

## 2021-01-27 ENCOUNTER — Emergency Department (HOSPITAL_COMMUNITY): Payer: 59

## 2021-01-27 LAB — URINALYSIS, ROUTINE W REFLEX MICROSCOPIC
Bacteria, UA: NONE SEEN
Bilirubin Urine: NEGATIVE
Glucose, UA: NEGATIVE mg/dL
Ketones, ur: NEGATIVE mg/dL
Leukocytes,Ua: NEGATIVE
Nitrite: NEGATIVE
Protein, ur: NEGATIVE mg/dL
Specific Gravity, Urine: 1.008 (ref 1.005–1.030)
pH: 5 (ref 5.0–8.0)

## 2021-01-27 LAB — CBG MONITORING, ED: Glucose-Capillary: 158 mg/dL — ABNORMAL HIGH (ref 70–99)

## 2021-01-27 MED ORDER — HALOPERIDOL 5 MG PO TABS
10.0000 mg | ORAL_TABLET | Freq: Every day | ORAL | Status: DC
  Administered 2021-01-27 – 2021-01-29 (×2): 10 mg via ORAL
  Filled 2021-01-27 (×2): qty 2

## 2021-01-27 MED ORDER — DIPHENHYDRAMINE HCL 50 MG/ML IJ SOLN: 50.0000 mg | Freq: Three times a day (TID) | INTRAMUSCULAR | Status: DC | PRN

## 2021-01-27 MED ORDER — SODIUM CHLORIDE 0.9 % IV BOLUS
1000.0000 mL | Freq: Once | INTRAVENOUS | Status: AC
  Administered 2021-01-27: 1000 mL via INTRAVENOUS

## 2021-01-27 MED ORDER — METFORMIN HCL 500 MG PO TABS: 500.0000 mg | ORAL_TABLET | Freq: Two times a day (BID) | ORAL | Status: DC

## 2021-01-27 MED ORDER — IOHEXOL 350 MG/ML SOLN
100.0000 mL | Freq: Once | INTRAVENOUS | Status: AC | PRN
  Administered 2021-01-27: 100 mL via INTRAVENOUS

## 2021-01-27 MED ORDER — DIAZEPAM 5 MG PO TABS
5.0000 mg | ORAL_TABLET | Freq: Two times a day (BID) | ORAL | Status: DC | PRN
  Administered 2021-01-29: 5 mg via ORAL
  Filled 2021-01-27: qty 1

## 2021-01-27 MED ORDER — STERILE WATER FOR INJECTION IJ SOLN
INTRAMUSCULAR | Status: AC
  Filled 2021-01-27: qty 10

## 2021-01-27 MED ORDER — HALOPERIDOL LACTATE 5 MG/ML IJ SOLN: 5.0000 mg | Freq: Three times a day (TID) | INTRAMUSCULAR | Status: DC | PRN

## 2021-01-27 MED ORDER — METFORMIN HCL 500 MG PO TABS
500.0000 mg | ORAL_TABLET | Freq: Two times a day (BID) | ORAL | Status: DC
  Administered 2021-01-29: 500 mg via ORAL
  Filled 2021-01-27: qty 1

## 2021-01-27 MED ORDER — BENZTROPINE MESYLATE 0.5 MG PO TABS
0.5000 mg | ORAL_TABLET | Freq: Four times a day (QID) | ORAL | Status: DC | PRN
  Filled 2021-01-27: qty 1

## 2021-01-27 NOTE — BH Assessment (Signed)
Pt is currently in waiting area and unable to participate in tele-assessment. TTS will complete assessment when Pt is in private area.   Pamalee Leyden, [facility], [facility] Triage Specialist [phone]

## 2021-01-27 NOTE — ED Notes (Signed)
Pt to shower, sitter present.

## 2021-01-27 NOTE — ED Provider Notes (Signed)
[facility] EMERGENCY DEPARTMENT Provider Note   CSN: [id number] Arrival date & time: 01/26/21  1500     History Chief Complaint  Patient presents with   Psychiatric Evaluation    Noah Ramsey is a 25 y.o. male.  25 year old male presents with complaint of feeling sore, reports difficulty walking since the age of 52. Denies any other complaints. History of schizoaffective disorder, triage complaint of not taking meds and violent behavior per mother (mother not present at time of exam). Level 5 caveat applies. Difficult historian, specifically denies chest pain, abdominal pain, shortness of breath, SI, HI.      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 No pertinent surgical history.     Family History  Problem Relation Age of Onset   Mental illness Brother     Social History   Tobacco Use   Smoking status: Never   Smokeless tobacco: Never  Substance Use Topics   Alcohol use: Never   Drug use: Never    Home Medications Prior to Admission medications   Medication Sig Start Date End Date Taking? Authorizing Provider  diazepam (VALIUM) 5 MG tablet Take 5 mg by mouth 2 (two) times daily as needed for anxiety.   Yes [provider]  haloperidol (HALDOL) 10 MG tablet Take 10 mg by mouth See admin instructions. Take one tablet (10 mg) by mouth nightly until 01/26/21 to give injection time to take effect 01/12/21  Yes [provider]  haloperidol decanoate  (HALDOL DECANOATE) 100 MG/ML injection Inject 100 mg into the muscle every 30 (thirty) days. 01/15/21  Yes [provider]  metFORMIN (GLUCOPHAGE) 500 MG tablet Take 1 tablet (500 mg total) by mouth 2 (two) times daily with a meal. :Medication induced wt management 01/05/21  Yes Armandina Stammer I, NP  benztropine (COGENTIN) 0.5 MG tablet Take 1 tablet (0.5 mg total) by mouth every 6 (six) hours as needed for tremors. Patient not taking: No sig reported 01/05/21   Armandina Stammer I, NP  doxepin (SINEQUAN) 10 MG capsule Take 1 capsule (10 mg total) by mouth at bedtime. For sleep/anxiety Patient not taking: No sig reported 01/05/21   Armandina Stammer I, NP  gabapentin (NEURONTIN) 300 MG capsule Take 1 capsule (300 mg total) by mouth at bedtime. For agitation Patient not taking: No sig reported 01/05/21   Armandina Stammer I, NP  hydrOXYzine (ATARAX/VISTARIL) 25 MG tablet Take 1 tablet (25 mg total) by mouth every 6 (six) hours as needed for anxiety. Patient not taking: No sig reported 01/05/21   Armandina Stammer I, NP  paliperidone (INVEGA SUSTENNA) 234 MG/1.5ML SUSY injection Inject 234 mg into the muscle every 28 (twenty-eight) days. Due on 01-21-21: For mood control Patient not taking: No sig reported 01/21/21   Armandina Stammer I, NP  polyethylene glycol (MIRALAX / GLYCOLAX) 17 g packet Take 17 g by mouth daily. For constipation Patient not taking: No sig reported 01/06/21   Sanjuana Kava, NP  propranolol (INDERAL) 40 MG tablet Take 1 tablet (40 mg total) by mouth every 8 (eight) hours. For anxiety Patient not taking: Reported on 01/26/2021 01/05/21   Armandina Stammer I, NP  risperiDONE (RISPERDAL) 2 MG tablet Take 1 tablet (2 mg total) by mouth 2 (two) times daily. For mood control Patient not taking: No sig reported 01/05/21   Sanjuana Kava, NP    Allergies    Patient has no known allergies.  Review of Systems   Review of Systems  Unable to perform ROS: Psychiatric disorder   Physical Exam Updated Vital  Signs BP (!) 153/88 (BP Location: Left Arm)   Pulse (!) 111   Temp 98.7 F (37.1 C) (Oral)   Resp 18   Ht 5\' 7"  (1.702 m)   Wt (!) 143.8 kg   SpO2 98%   BMI 49.65 kg/m   Physical Exam Vitals and nursing note reviewed.  Constitutional:      Appearance: He is obese.  HENT:     Head: Normocephalic and atraumatic.     Mouth/Throat:     Mouth: Mucous membranes are moist.  Eyes:     Extraocular Movements: Extraocular movements intact.     Pupils: Pupils are equal, round, and reactive to light.  Cardiovascular:     Rate and Rhythm: Regular rhythm. Tachycardia present.     Pulses: Normal pulses.     Heart sounds: Normal heart sounds.  Pulmonary:     Effort: Tachypnea present.     Breath sounds: Normal breath sounds.  Abdominal:     Palpations: Abdomen is soft.     Tenderness: There is no abdominal tenderness.  Musculoskeletal:     Cervical back: Neck supple.     Right lower leg: No edema.     Left lower leg: No edema.  Skin:    General: Skin is warm and dry.  Neurological:     General: No focal deficit present.     Mental Status: He is alert.     Motor: No weakness.  Psychiatric:        Mood and Affect: Mood normal. Affect is flat.        Behavior: Behavior is slowed. Behavior is not agitated or aggressive.        Thought Content: Thought content does not include homicidal or suicidal ideation.    ED Results / Procedures / Treatments   Labs (all labs ordered are listed, but only abnormal results are displayed) Labs Reviewed  COMPREHENSIVE METABOLIC PANEL - Abnormal; Notable for the following components:      Result Value   Glucose, Bld 104 (*)    AST 53 (*)    All other components within normal limits  RAPID URINE DRUG SCREEN, HOSP PERFORMED - Abnormal; Notable for the following components:   Benzodiazepines POSITIVE (*)    All other components within normal limits  URINALYSIS, ROUTINE W REFLEX MICROSCOPIC - Abnormal; Notable for the following components:    Color, Urine STRAW (*)    Hgb urine dipstick MODERATE (*)    All other components within normal limits  CBG MONITORING, ED - Abnormal; Notable for the following components:   Glucose-Capillary 158 (*)    All other components within normal limits  RESP PANEL BY RT-PCR (FLU A&B, COVID) ARPGX2  ETHANOL  CBC WITH DIFFERENTIAL/PLATELET    EKG None  Radiology DG Chest 2 View  Result Date: 01/27/2021 CLINICAL DATA:  25 year old male with shortness of breath and tachycardia. EXAM: CHEST - 2  VIEW COMPARISON:  None. FINDINGS: PA and lateral views. Low lung volumes, especially on the lateral where there is also respiratory motion artifact. Mediastinal contours are within normal limits. Visualized tracheal air column is within normal limits. No pneumothorax, pulmonary edema, pleural effusion or confluent pulmonary opacity. No osseous abnormality identified.  Negative visible bowel gas. IMPRESSION: Low lung volumes.  No cardiopulmonary abnormality. Electronically Signed   By: Odessa Fleming M.D.   On: 01/27/2021 07:29   CT Angio Chest PE W/Cm &/Or Wo Cm  Result Date: 01/27/2021 CLINICAL DATA:  Concern for pulmonary embolism. EXAM: CT ANGIOGRAPHY CHEST WITH CONTRAST TECHNIQUE: Multidetector CT imaging of the chest was performed using the standard protocol during bolus administration of intravenous contrast. Multiplanar CT image reconstructions and MIPs were obtained to evaluate the vascular anatomy. CONTRAST:  OMNIPAQUE IOHEXOL 350 MG/ML SOLN COMPARISON:  None. FINDINGS: Examination is degraded due to patient body habitus, associated quantum mottle artifact, as well as patient respiratory artifact. Vascular Findings: There is adequate opacification of the pulmonary arterial system with the main pulmonary artery measuring 311 Hounsfield units. There are no discrete filling defects within the pulmonary arterial tree to the level of the bilateral segmental pulmonary arteries. Evaluation of the segmental pulmonary  arteries is degraded secondary to a combination of patient respiratory artifact as well as quantum mottle artifact due to patient body habitus. Normal caliber of the main pulmonary artery. Normal heart size. No pericardial effusion. No evidence of thoracic aortic aneurysm or dissection on this nongated examination. Conventional configuration of the aortic arch. Review of the MIP images confirms the above findings. ---------------------------------------------------------------------------------- Nonvascular Findings: Mediastinum/Lymph Nodes: No bulky mediastinal, hilar or axillary lymphadenopathy. There is a minimal amount of ill-defined tissue within the anterior mediastinum which is without associated mass effect, favored to represent thymic tissue. Lungs/Pleura: Evaluation the pulmonary parenchyma is degraded secondary to patient respiratory artifact. No discrete focal airspace opacities. No pleural effusion or pneumothorax. The central pulmonary airways appear widely patent No discrete pulmonary nodules. Upper abdomen: Limited early arterial phase evaluation the upper abdomen demonstrates an approximately 1.6 cm cyst arising from the superior pole the right kidney (image 122, series 5) Musculoskeletal: No acute or aggressive osseous abnormalities. Bilateral gynecomastia. IMPRESSION: No acute cardiopulmonary disease on this body habitus and respiratory artifact degraded examination. Specifically, no evidence of pulmonary embolism the level of bilateral segmental pulmonary arteries. Electronically Signed   By: Simonne Come M.D.   On: 01/27/2021 09:14    Procedures Procedures   Medications Ordered in ED Medications  acetaminophen (TYLENOL) tablet 650 mg (has no administration in time range)  alum & mag hydroxide-simeth (MAALOX/MYLANTA) 200-200-20 MG/5ML suspension 30 mL (has no administration in time range)  nicotine (NICODERM CQ - dosed in mg/24 hours) patch 21 mg (21 mg Transdermal Patch Applied 01/27/21  0925)  diazepam (VALIUM) tablet 5 mg (has no administration in time range)  haloperidol (HALDOL) tablet 10 mg (has no administration in time range)  haloperidol lactate (HALDOL) injection 5 mg (has no administration in time range)    And  diphenhydrAMINE (BENADRYL) injection 50 mg (has no administration in time range)  metFORMIN (GLUCOPHAGE) tablet 500 mg (has no administration in time range)  ziprasidone (GEODON) injection 20 mg (20 mg Intramuscular Given 01/27/21 0401)  sterile water (preservative free) injection (  Given 01/27/21 0401)  sodium chloride 0.9 % bolus 1,000 mL (0 mLs Intravenous Stopped 01/27/21 0924)  iohexol (OMNIPAQUE) 350 MG/ML injection 100 mL (100 mLs Intravenous Contrast Given 01/27/21 0856)  ED Course  I have reviewed the triage vital signs and the nursing notes.  Pertinent labs & imaging results that were available during my care of the patient were reviewed by me and considered in my medical decision making (see chart for details).  Clinical Course as of 01/27/21 1406  Sat Jan 27, 2021  1780 24 year old male brought in by mom for medication non compliance. Mom is no longer in the ER, I attempted to call who did not answer. Patient is tachycardic, no complaints of CP or SHOB. UDS positive for benzos, was given Geodon 3 hours ago.  HR 140s, temp 99.5. Plan is to check EKG, CXR, IV fluid bolus, CBG. Reassess, unable to medically clear at this time. CBC, CMP without significant findings to explain patient's tachycardia.  Discussed with Dr. Jeraldine Loots, ER attending. [LM]  1117 CTA negative for PE. HR is 113. [LM]  1402 Patient is seen by behavioral health who recommends inpatient treatment.  Patient is medically cleared for behavioral health management. [LM]  1403 Patient's COVID/flu swab was collected however never resulted.  He was COVID-positive last month, does not require repeat test today. [LM]    Clinical Course User Index [LM] Alden Hipp   MDM  Rules/Calculators/A&P                           Final Clinical Impression(s) / ED Diagnoses Final diagnoses:  Psychosis, unspecified psychosis type [facility])    Rx / DC Orders ED Discharge Orders     None        Jeannie Fend, PA-C 01/27/21 1406    Gerhard Munch, MD 01/27/21 1559

## 2021-01-27 NOTE — ED Notes (Signed)
Checked patient cbg it was 158

## 2021-01-27 NOTE — ED Notes (Signed)
Pt placed on cardiac monitor 
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
## 2021-01-28 LAB — RESP PANEL BY RT-PCR (FLU A&B, COVID) ARPGX2
Influenza A by PCR: NEGATIVE
Influenza B by PCR: NEGATIVE
SARS Coronavirus 2 by RT PCR: NEGATIVE

## 2021-01-28 NOTE — ED Provider Notes (Signed)
Emergency Medicine Observation Re-evaluation Note  Noah Ramsey is a 25 y.o. male, seen on rounds today.  Pt initially presented to the ED for complaints of Psychiatric Evaluation Currently, the patient is 11:02 AM, alert, ambulatory, withdrawn. Marland Kitchen  Physical Exam  BP (!) 127/98 (BP Location: Right Arm)   Pulse (!) 112   Temp 98.3 F (36.8 C)   Resp 20   Ht 5\' 7"  (1.702 m)   Wt (!) 143.8 kg   SpO2 99%   BMI 49.65 kg/m  Physical Exam General: In no distress, awake and alert Cardiac: mild tachycardia occasionally Lungs: No increased work of breathing Psych: Withdrawn, but pleasant  ED Course / MDM  EKG:   I have reviewed the labs performed to date as well as medications administered while in observation.  Recent changes in the last 24 hours include none.  Plan  Current plan is for behavioral health placement.  Ganon Demasi is not under involuntary commitment.     [name], MD 01/28/21 [phone]

## 2021-01-28 NOTE — ED Notes (Signed)
Lunch ordered 

## 2021-01-28 NOTE — ED Notes (Signed)
Dinner Ordered 

## 2021-01-28 NOTE — Progress Notes (Signed)
Per Vivien Presto, patient meets criteria for inpatient treatment. There are no available or appropriate beds at [facility] today. CSW faxed referrals to the following facilities for review:  [facility]  Pending - No Request Sent N/A [address], [place] [address] [phone] --  [facility]  Pending - No Request Sent N/A [address], [place] [address] [phone] --  [facility]  Pending - No Request [address] [place] [address] [phone] --  [facility]  Pending - No Request Sent N/A [address] [place] [address] [phone] --  [facility]  Pending - No Request Sent N/A [address] [place] [address] [phone] --  [facility]  Pending - No Request Sent N/A [address] [place], [place] [address] [phone] --  [facility]  Pending - No Request Sent N/A [address] [place] [address] [phone] --  [facility]  Pending - No Request Sent N/A [address], [place] [address] [phone] --  [facility]  Pending - No Request Sent N/A [address] [place] [address] [phone] --  [facility]  Pending - No Request Sent N/A [address] [place] [address] [phone] --  [facility]  Pending - No Request Sent N/A [address], [place] [address] [phone] --  [facility]  Pending - No Request Sent N/A [address] [place] [address] [phone] --  [facility]  Pending - No Request Sent N/A [address] [name] [place] [address] [phone] --   [facility]  Pending - No Request Sent N/A [address], [place] [address] [phone] --  [facility]  Pending - No Request Sent N/A [address] [place] [address] [phone] --  [facility]  Pending - No Request Sent N/A [address] [place] [address] [phone] --  [facility]  Pending - No Request Sent N/A [address] [place] [address] [phone] --  [facility]  Pending - No Request Sent N/A [address] [place], [place] [address] [phone] --  [facility]  Pending - No Request Sent N/A [address] [place] [address] [phone] --  [facility]  Pending - No Request Sent N/A [address] [place], [place] [address] [phone] --   TTS will continue to seek bed placement.  Crissie Reese, MSW, LCSW-A, LCAS-A Phone: [phone] Disposition/TOC

## 2021-01-28 NOTE — ED Notes (Signed)
Patient spilled water on clothing and requested more clothes. As this nurse was off unit to get more clothes for patient, patient ran through unit doors to main hospital per the sitter with another patient. Security notified and responded. MD notified about patient, no new orders. Patient had on wet burgundy scrubs without an IV.

## 2021-01-28 NOTE — ED Notes (Signed)
Patient returned to unit. Provided new scrubs and wash cloths to was up at sink. Patient states he is ready to go home. MD notified

## 2021-01-28 NOTE — ED Notes (Signed)
Repeat covid swab collected and sent to lab  

## 2021-01-29 LAB — CBG MONITORING, ED: Glucose-Capillary: 153 mg/dL — ABNORMAL HIGH (ref 70–99)

## 2021-01-29 NOTE — ED Notes (Signed)
This RN received call from security that pt mother is here, this RN asked security to have mother wait in lobby while discharge paperwork was being completed and that pt would be out to lobby soon.

## 2021-01-29 NOTE — ED Notes (Signed)
Patient ambulatory with sitter to purple zone for shower.

## 2021-01-29 NOTE — ED Notes (Signed)
Patient pacing the floors and has left unit once. States he is ready to go home. Patient also states he needs attention. Patient sat down at computer on wheels and seems content. Due to no sitter available and other patients in my care, I have allowed patient this entertainment

## 2021-01-29 NOTE — ED Notes (Signed)
Pt asking for food denied not sleep all night

## 2021-01-29 NOTE — ED Notes (Signed)
Pt appears to be sleeping at this time.

## 2021-01-29 NOTE — ED Notes (Signed)
Tia NT to escort pt to lobby and ensure pt discharged home with mother per this RN request, Tia NT back to unit with pt and informs this RN that pt mother was not in lobby. Tia NT also informs this RN that she called pt mother while in lobby with pt to try and locate her to ensure discharge ride home and was told by pt mother that she had left the hospital premises and was not aware pt was up for discharge,.

## 2021-01-29 NOTE — ED Notes (Signed)
Pt not sleeping med given

## 2021-01-29 NOTE — ED Notes (Signed)
Security with the pt

## 2021-01-29 NOTE — ED Notes (Signed)
The pt is not staying in his room security was called as the pt walked into the hallway  the pt came back into the pts room  security came but was conserned that  the pt was not ivcd    wanting to know what they could do .  Charge nurse notrified.    Security left   the pt continues to walk around the yellow pod  sitter with the pt

## 2021-01-29 NOTE — Progress Notes (Signed)
Patient denied by [facility] AC at this time.   Continued recommendation of inpatient psych per Ophelia Shoulder, NP. Patient referred out again at this time. See original note written by Crissie Reese, LCSWA for full list. Situation ongoing, CSW will continue to monitor and update note as more information becomes available.   Signed:  Corky Crafts, MSW, [place], LCASA 01/29/2021 9:31 AM

## 2021-01-29 NOTE — Discharge Instructions (Addendum)
OUTPATIENT PROGRAMS:       [facility]      [address]      [place], [place] [address]      [phone]      Ask about their Substance Abuse Intensive Outpatient Program.  They also offer psychiatry/medication management and therapy.  New patients are being seen in their walk-in clinic.  Walk-in hours are Monday - Thursday from 8:00 am - 11:00 am for psychiatry, and Friday    [facility] [address] [place],  [place], [place] [address]  [phone] [facility] [phone] [facility] help line is available day or night to connect people with providers in central [place]. The have licensed clinicians on-call who will assess your situation and help you find affordable treatment options in your area.   [facility] [phone] This is a local organization that provides 24/7 mobile crisis response services, providing immediate evaluation, triage, and access to emergency mental health services, treatment and support. Their services are free of charge, and their crisis teams are able to respond face to face within 2 hours of contact.   [employer] 2-1-1 from any phone in [place]. Information referral service that helps locate local community resources and social services for those in need, from housing to clothing to medical treatment.

## 2021-01-29 NOTE — ED Notes (Signed)
Pt walked out into the hallway again away from his room. Sitter and EMT walked the pt back to his room.

## 2021-01-29 NOTE — ED Notes (Signed)
Pt d/c home per MD order. Pt mother here for discharge ride home. Discharge summary reviewed, verbalize understanding. Ambulatory off unit. No s/s of acute distress noted.

## 2021-01-29 NOTE — ED Notes (Signed)
Pt back up at the nurses desk

## 2021-01-29 NOTE — Progress Notes (Signed)
CSW reached Pt's mother via phone @[phone]. Mother states that she will come at pick up Pt at 8:15. CSW added packet of outpatient mental health resources to paperwork.

## 2021-01-29 NOTE — ED Notes (Signed)
This NT was escorting pt out to lobby per RN request for discharge. NT to locate pt mother in lobby .  NT and patient  waiting and looking for pt mother for awhile, unable to locate, this NT then called pt mother to figure out location .Pt mother then stated that she left , this NT back to unit with pt to inform RN.

## 2021-01-29 NOTE — ED Notes (Signed)
The pt attempted to walk out  he was in the hallway and returned with sitter and nursing personnel

## 2021-01-29 NOTE — ED Notes (Addendum)
Noted pt is psychiatrically cleared. Pt tells this nurse that his mother will be his discharge ride home , this RN attempted to contact pt mother, pt mother not answring phone. [facility] NP Takia made aware, reports will place order for social work consult to ensure discharge plan and resources.

## 2021-01-29 NOTE — Consult Note (Signed)
[facility] Face to Face Consultation   Reason for Consult:  IVC, aggression, psychosis Referring Physician:  EDP  Patient Identification: Noah Ramsey MRN:  [id number] Principal Diagnosis: <principal problem not specified> Diagnosis:  Active Problems:   Schizoaffective disorder (HCC)   Total Time spent with patient: 30 minutes  Subjective:   Noah Ramsey is a 25 y.o. male patient admitted to [facility] ED brought in by his mother with complaints the patient had not slept in 5 days, increased aggression, threats towards himself and others.  Patient also having delusional thoughts of being an [place] got or in vampire.  Psychiatric Consult Reassessment 01/29/2021 [name], 25 y.o., male patient seen face-to-face by this provider, chart reviewed and consulted with Dr. Lucianne Muss.  On initial evaluation patient is observed to be sitting at a computer on wheels (Cow ), when seeking clarification as to why he was sitting and using a computer in his room .  Patient is observed to stand up from the computer, and go live back in bed.  In which she appropriately begins psychiatric reassessment, reporting that he likes computers.  He then begins to have a linear conversation, while stating he is having hallucinations.  Patient reports "I have a police GPS tracker in my brain.  I am mentally faster and stronger today, I was able to sleep good.  "  As noted above is able to have a linear conversation, answers all questions appropriately, and does not exhibit any clinical signs of acute psychosis, internal stimuli response, and or external stimuli response.  He is able to smell accordingly, and shows improvement in his judgment.  He reports he has been sleeping and eating without any disturbances and or difficulty.  Patient continues to endorse some auditory hallucinations, although he denies any acute psychosis, paranoia, and or delusional thinking.  He denies any suicidal ideations and or homicidal  ideations.  He does not appear to be in any acute distress at this time, furthermore he is alert and oriented x3, calm and cooperative throughout the assessment.  Patient is also organized, and does not appear to be presenting with any disorientation, confusion, and altered mental status.  Patient has continued to cooperate while in the emergency department, and has not exhibited any disruptive behaviors, psychosis, and or mania since admission.  At this time will psychiatrically clear patient to discharge, with outpatient psychiatric services.  Patient is currently receiving outpatient help at [facility] [place], also referral was placed for strategic interventions at the time of his last discharge on September 17.  Patient will continue to benefit from ACT team, as he continues to present to the emergency room with ongoing delusions, hallucinations, and aggressive behavior periodically.  Past Psychiatric History: Schizophrenia  Risk to Self: Denies Risk to Others:   Prior Inpatient Therapy: Yes Prior Outpatient Therapy: Yes  Past Medical History:  Past Medical History:  Diagnosis Date   Elevated CPK    per patient   Schizophrenia (HCC)    History reviewed. No pertinent surgical history. Family History:  Family History  Problem Relation Age of Onset   Mental illness Brother    Family Psychiatric  History: Patient unaware records indicate a brother with mental health issues Social History:  Social History   Substance and Sexual Activity  Alcohol Use Never     Social History   Substance and Sexual Activity  Drug Use Never    Social History   Socioeconomic History   Marital status: Single    Spouse name: Not  on file   Number of children: Not on file   Years of education: Not on file   Highest education level: Not on file  Occupational History   Not on file  Tobacco Use   Smoking status: Never   Smokeless tobacco: Never  Substance and Sexual Activity   Alcohol use: Never   Drug  use: Never   Sexual activity: Not on file  Other Topics Concern   Not on file  Social History Narrative   Not on file   Social Determinants of Health   Financial Resource Strain: Not on file  Food Insecurity: Not on file  Transportation Needs: Not on file  Physical Activity: Not on file  Stress: Not on file  Social Connections: Not on file   Additional Social History:    Allergies:  No Known Allergies  Labs:  Results for orders placed or performed during the hospital encounter of 01/26/21 (from the past 48 hour(s))  CBG monitoring, ED     Status: Abnormal   Collection Time: 01/29/21  8:24 AM  Result Value Ref Range   Glucose-Capillary 153 (H) 70 - 99 mg/dL    Comment: Glucose reference range applies only to samples taken after fasting for at least 8 hours.    Medications:  Current Facility-Administered Medications  Medication Dose Route Frequency Provider Last Rate Last Admin   acetaminophen (TYLENOL) tablet 650 mg  650 mg Oral Q4H PRN Fayrene Helper, PA-C       alum & mag hydroxide-simeth (MAALOX/MYLANTA) 200-200-20 MG/5ML suspension 30 mL  30 mL Oral Q6H PRN Fayrene Helper, PA-C       diazepam (VALIUM) tablet 5 mg  5 mg Oral BID PRN Chales Abrahams, NP   5 mg at 01/29/21 0101   haloperidol lactate (HALDOL) injection 5 mg  5 mg Intramuscular TID PRN Chales Abrahams, NP       And   diphenhydrAMINE (BENADRYL) injection 50 mg  50 mg Intramuscular TID PRN Chales Abrahams, NP       haloperidol (HALDOL) tablet 10 mg  10 mg Oral QHS Ophelia Shoulder E, NP   10 mg at 01/29/21 0100   metFORMIN (GLUCOPHAGE) tablet 500 mg  500 mg Oral BID WC Cathie Hoops, RPH   500 mg at 01/29/21 0827   nicotine (NICODERM CQ - dosed in mg/24 hours) patch 21 mg  21 mg Transdermal Daily Fayrene Helper, PA-C   21 mg at 01/29/21 8295   Current Outpatient Medications  Medication Sig Dispense Refill   diazepam (VALIUM) 5 MG tablet Take 5 mg by mouth 2 (two) times daily as needed for anxiety.     haloperidol  (HALDOL) 10 MG tablet Take 10 mg by mouth See admin instructions. Take one tablet (10 mg) by mouth nightly until 01/26/21 to give injection time to take effect     haloperidol decanoate (HALDOL DECANOATE) 100 MG/ML injection Inject 100 mg into the muscle every 30 (thirty) days.     metFORMIN (GLUCOPHAGE) 500 MG tablet Take 1 tablet (500 mg total) by mouth 2 (two) times daily with a meal. :Medication induced wt management 60 tablet 0   benztropine (COGENTIN) 0.5 MG tablet Take 1 tablet (0.5 mg total) by mouth every 6 (six) hours as needed for tremors. (Patient not taking: No sig reported) 60 tablet 0   doxepin (SINEQUAN) 10 MG capsule Take 1 capsule (10 mg total) by mouth at bedtime. For sleep/anxiety (Patient not taking: No sig reported) 30 capsule 0  gabapentin (NEURONTIN) 300 MG capsule Take 1 capsule (300 mg total) by mouth at bedtime. For agitation (Patient not taking: No sig reported) 30 capsule 0   hydrOXYzine (ATARAX/VISTARIL) 25 MG tablet Take 1 tablet (25 mg total) by mouth every 6 (six) hours as needed for anxiety. (Patient not taking: No sig reported) 75 tablet 0   paliperidone (INVEGA SUSTENNA) 234 MG/1.5ML SUSY injection Inject 234 mg into the muscle every 28 (twenty-eight) days. Due on 01-21-21: For mood control (Patient not taking: No sig reported) 1.8 mL 0   polyethylene glycol (MIRALAX / GLYCOLAX) 17 g packet Take 17 g by mouth daily. For constipation (Patient not taking: No sig reported) 14 each 0   propranolol (INDERAL) 40 MG tablet Take 1 tablet (40 mg total) by mouth every 8 (eight) hours. For anxiety (Patient not taking: Reported on 01/26/2021) 90 tablet 0   risperiDONE (RISPERDAL) 2 MG tablet Take 1 tablet (2 mg total) by mouth 2 (two) times daily. For mood control (Patient not taking: No sig reported) 60 tablet 0    Musculoskeletal: Strength & Muscle Tone: within normal limits Gait & Station: normal Patient leans: N/A  Psychiatric Specialty Exam:  Presentation  General  Appearance: Appropriate for Environment; Fairly Groomed  Eye Contact:Good  Speech:Clear and Coherent  Speech Volume:Normal  Handedness:Right   Mood and Affect  Mood:Euthymic  Affect:Constricted   Thought Process  Thought Processes:Coherent  Descriptions of Associations:Tangential  Orientation:Full (Time, Place and Person)  Thought Content:Delusions  History of Schizophrenia/Schizoaffective disorder:Yes  Duration of Psychotic Symptoms:Greater than six months  Hallucinations:No data recorded  Ideas of Reference:Delusions  Suicidal Thoughts:No data recorded  Homicidal Thoughts:No data recorded   Sensorium  Memory:Immediate Fair; Recent Fair  Judgment:Impaired  Insight:Shallow   Executive Functions  Concentration:Fair  Attention Span:Fair  Recall:Fair  Fund of Knowledge:Fair  Language:Good   Psychomotor Activity  Psychomotor Activity:No data recorded   Assets  Assets:Communication Skills; Desire for Improvement; Housing; Social Support; Leisure Time   Sleep  Sleep:No data recorded    Physical Exam: Physical Exam Vitals and nursing note reviewed. Exam conducted with a chaperone present.  Constitutional:      General: He is not in acute distress.    Appearance: Normal appearance. He is obese. He is not ill-appearing.  HENT:     Head: Normocephalic.  Cardiovascular:     Rate and Rhythm: Normal rate.  Pulmonary:     Effort: Pulmonary effort is normal.  Musculoskeletal:        General: Normal range of motion.     Cervical back: Normal range of motion.  Neurological:     General: No focal deficit present.     Mental Status: He is alert and oriented to person, place, and time. Mental status is at baseline.     Comments: Oriented to self and place   Psychiatric:        Attention and Perception: Attention normal.        Mood and Affect: Mood normal. Affect is labile.        Speech: Delayed: Slow.        Behavior: Behavior normal.  Behavior is cooperative.        Thought Content: Thought content is paranoid and delusional. Thought content does not include homicidal or suicidal ideation.        Judgment: Judgment normal.   Review of Systems  Constitutional: Negative.   HENT: Negative.    Eyes: Negative.   Respiratory: Negative.    Cardiovascular: Negative.  Gastrointestinal: Negative.   Genitourinary: Negative.   Musculoskeletal: Negative.        Patient states he is not having any muscle aches or pains  Skin: Negative.   Neurological: Negative.   Endo/Heme/Allergies: Negative.   Psychiatric/Behavioral:  Positive for hallucinations. Negative for depression (Denies). Suicidal ideas: Denies at this time.The patient is nervous/anxious. Insomnia: States he slept well but the loud noises wakes him up..  All other systems reviewed and are negative. Blood pressure 123/73, pulse (!) 109, temperature (!) 97.3 F (36.3 C), temperature source Oral, resp. rate 16, height 5\' 7"  (1.702 m), weight (!) 143.8 kg, SpO2 99 %. Body mass index is 49.65 kg/m.  Treatment Plan Summary: Daily contact with patient to assess and evaluate symptoms and progress in treatment, Medication management, and Plan inpatient psychiatric treatment.   Recommendations: Patient will greatly benefit from ACT team placement, recommend working closely with social work to facilitate placement and not just a referral.  Patient does need adequate follow-up appointment is recommended. -Will psych clear patient at this time, will need to follow-up with outpatient psychiatric services at [facility]. -Patient with known diagnosis schizoaffective disorder, has notable chronic hallucinations, that tend to wax and wane as a result of medication noncompliance.  [facility] (01/06/2021).   Medication Management:   Continue Haldol 10 mg p.o. nightly, diazepam 5 mg p.o. twice daily as  needed.  Disposition: No evidence of imminent risk to self or others at present.   Patient does not meet criteria for psychiatric inpatient admission. Supportive therapy provided about ongoing stressors. Refer to IOP. Discussed crisis plan, support from social network, calling 911, coming to the Emergency Department, and calling Suicide Hotline. Refer to ACT team    01/08/2021, FNP 01/29/2021 12:31 PM

## 2021-01-29 NOTE — ED Notes (Signed)
This RN called pt mother after informed by Tia NT that mother had left lobby, This RN apologetic to pt mother for the aprox 20 min delay, but informed her that pt was ready now and had discharge papers. Pt mother then informs this nurse it will be "hours" before she is able to come and pick up patient. This RN notified ED social work of current situation.

## 2021-01-29 NOTE — Social Work (Signed)
CSW received consult for substance abuse education at 4:44 pm.  CSW began reviewing chart and read notes pertaining to need for mother to be called regarding d/c and transportation home. CSW was continuing to review chart when a call was received from floor RN stating that Pt's mother was at ED and discharge paperwork was missing ACCT team referral. Floor RN following up on [facility] notes in chart stated that an ACTT team was recommended. CSW informed RN that no ACTT team had been set up, but that CSW would put resources on AVS as it was already past 5pm and no set up could occur at that point.

## 2021-01-29 NOTE — ED Provider Notes (Signed)
  Physical Exam  BP 129/76   Pulse (!) 106   Temp 98 F (36.7 C)   Resp 16   Ht 5\' 7"  (1.702 m)   Wt (!) 143.8 kg   SpO2 100%   BMI 49.65 kg/m   Physical Exam  ED Course/Procedures   Clinical Course as of 01/29/21 1718  Sat Jan 27, 2021  5988 25 year old male brought in by mom for medication non compliance. Mom is no longer in the ER, I attempted to call who did not answer. Patient is tachycardic, no complaints of CP or SHOB. UDS positive for benzos, was given Geodon 3 hours ago.  HR 140s, temp 99.5. Plan is to check EKG, CXR, IV fluid bolus, CBG. Reassess, unable to medically clear at this time. CBC, CMP without significant findings to explain patient's tachycardia.  Discussed with Dr. 22, ER attending. [LM]  1117 CTA negative for PE. HR is 113. [LM]  1402 Patient is seen by behavioral health who recommends inpatient treatment.  Patient is medically cleared for behavioral health management. [LM]  1403 Patient's COVID/flu swab was collected however never resulted.  He was COVID-positive last month, does not require repeat test today. [LM]    Clinical Course User Index [LM] Jeraldine Loots, PA-C    Procedures  MDM  Patient is now been seen by psychiatry and cleared for discharge.  Recommended ACT team follow-up and setting appointment.  However unable to do at this time.  Patient's mother is here to pick him up.  Will discharge and more resources given.       Jeannie Fend, MD 01/29/21 1719

## 2021-01-30 ENCOUNTER — Ambulatory Visit (HOSPITAL_COMMUNITY)
Admission: EM | Admit: 2021-01-30 | Discharge: 2021-01-30 | Disposition: A | Discharge status: Home / Self Care | Payer: 59 | Attending: Student | Admitting: Student

## 2021-01-30 ENCOUNTER — Other Ambulatory Visit (HOSPITAL_COMMUNITY): Payer: Self-pay

## 2021-01-30 ENCOUNTER — Encounter (HOSPITAL_COMMUNITY): Payer: Self-pay | Admitting: Registered Nurse

## 2021-01-30 DIAGNOSIS — Z20822 Contact with and (suspected) exposure to covid-19: Secondary | ICD-10-CM | POA: Diagnosis not present

## 2021-01-30 DIAGNOSIS — M6282 Rhabdomyolysis: Secondary | ICD-10-CM | POA: Insufficient documentation

## 2021-01-30 DIAGNOSIS — E669 Obesity, unspecified: Secondary | ICD-10-CM | POA: Insufficient documentation

## 2021-01-30 DIAGNOSIS — Z7984 Long term (current) use of oral hypoglycemic drugs: Secondary | ICD-10-CM | POA: Diagnosis not present

## 2021-01-30 DIAGNOSIS — F202 Catatonic schizophrenia: Secondary | ICD-10-CM | POA: Insufficient documentation

## 2021-01-30 DIAGNOSIS — R4689 Other symptoms and signs involving appearance and behavior: Secondary | ICD-10-CM

## 2021-01-30 DIAGNOSIS — F259 Schizoaffective disorder, unspecified: Secondary | ICD-10-CM | POA: Diagnosis present

## 2021-01-30 DIAGNOSIS — Z79899 Other long term (current) drug therapy: Secondary | ICD-10-CM | POA: Insufficient documentation

## 2021-01-30 LAB — RESP PANEL BY RT-PCR (FLU A&B, COVID) ARPGX2
Influenza A by PCR: NEGATIVE
Influenza B by PCR: NEGATIVE
SARS Coronavirus 2 by RT PCR: NEGATIVE

## 2021-01-30 LAB — POCT URINE DRUG SCREEN - MANUAL ENTRY (I-SCREEN)
POC Amphetamine UR: NOT DETECTED
POC Buprenorphine (BUP): POSITIVE — AB
POC Cocaine UR: NOT DETECTED
POC Marijuana UR: NOT DETECTED
POC Methadone UR: NOT DETECTED
POC Methamphetamine UR: NOT DETECTED
POC Morphine: NOT DETECTED
POC Oxazepam (BZO): NOT DETECTED
POC Oxycodone UR: NOT DETECTED
POC Secobarbital (BAR): NOT DETECTED

## 2021-01-30 MED ORDER — HYDROXYZINE HCL 25 MG PO TABS
25.0000 mg | ORAL_TABLET | Freq: Three times a day (TID) | ORAL | Status: DC | PRN
Start: 2021-01-30 — End: 2021-01-30
  Administered 2021-01-30: 25 mg via ORAL
  Filled 2021-01-30: qty 10
  Filled 2021-01-30: qty 1

## 2021-01-30 MED ORDER — LORAZEPAM 1 MG PO TABS
2.0000 mg | ORAL_TABLET | Freq: Once | ORAL | Status: AC
  Administered 2021-01-30: 2 mg via ORAL
  Filled 2021-01-30: qty 2

## 2021-01-30 MED ORDER — RISPERIDONE 2 MG PO TBDP
2.0000 mg | ORAL_TABLET | Freq: Two times a day (BID) | ORAL | Status: DC
  Administered 2021-01-30: 2 mg via ORAL
  Filled 2021-01-30: qty 1
  Filled 2021-01-30: qty 14

## 2021-01-30 MED ORDER — MAGNESIUM HYDROXIDE 400 MG/5ML PO SUSP
30.0000 mL | Freq: Every day | ORAL | Status: DC | PRN
Start: 2021-01-30 — End: 2021-01-30

## 2021-01-30 MED ORDER — ACETAMINOPHEN 325 MG PO TABS: 650.0000 mg | ORAL_TABLET | Freq: Four times a day (QID) | ORAL | Status: DC | PRN

## 2021-01-30 MED ORDER — PROPRANOLOL HCL 40 MG PO TABS
40.0000 mg | ORAL_TABLET | Freq: Two times a day (BID) | ORAL | Status: DC
  Administered 2021-01-30: 40 mg via ORAL
  Filled 2021-01-30 (×4): qty 14
  Filled 2021-01-30: qty 2

## 2021-01-30 MED ORDER — ALUM & MAG HYDROXIDE-SIMETH 200-200-20 MG/5ML PO SUSP: 30.0000 mL | ORAL | Status: DC | PRN

## 2021-01-30 MED ORDER — METFORMIN HCL 500 MG PO TABS
500.0000 mg | ORAL_TABLET | Freq: Two times a day (BID) | ORAL | Status: DC
  Administered 2021-01-30: 500 mg via ORAL
  Filled 2021-01-30: qty 1
  Filled 2021-01-30: qty 14

## 2021-01-30 MED ORDER — GABAPENTIN 300 MG PO CAPS
300.0000 mg | ORAL_CAPSULE | Freq: Every day | ORAL | Status: DC
  Filled 2021-01-30: qty 7

## 2021-01-30 MED ORDER — DOXEPIN HCL 10 MG PO CAPS
10.0000 mg | ORAL_CAPSULE | Freq: Every day | ORAL | Status: DC
  Filled 2021-01-30: qty 7

## 2021-01-30 NOTE — ED Provider Notes (Addendum)
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 01/21/2021 80  70 - 99 mg/dL Final   Glucose reference range applies only to samples taken after fasting for at least 8 hours.   BUN 01/21/2021 15  6 - 20 mg/dL Final   Creatinine, Ser 01/21/2021 0.89  0.61 - 1.24 mg/dL Final   Calcium 78/29/5621 9.6  8.9 - 10.3 mg/dL Final   Total Protein 30/86/5784 8.0  6.5 - 8.1 g/dL Final   Albumin 69/62/9528 4.1  3.5 - 5.0 g/dL Final   AST 41/32/4401 51 (A) 15 - 41 U/L Final   ALT 01/21/2021 43  0 - 44 U/L Final   Alkaline Phosphatase 01/21/2021 61  38 - 126 U/L Final   Total  Bilirubin 01/21/2021 0.8  0.3 - 1.2 mg/dL Final   GFR, Estimated 01/21/2021 >60  >60 mL/min Final   Comment: (NOTE) Calculated using the CKD-EPI Creatinine Equation (2021)    Anion gap 01/21/2021 7  5 - 15 Final   Performed at [facility], [address], [place], [place] [address]   Alcohol, Ethyl (B) 01/21/2021 <10  <10 mg/dL Final   Comment: (NOTE) Lowest detectable limit for serum alcohol is 10 mg/dL.  For medical purposes only. Performed at [facility], [address], [place], [place] [address]    WBC 01/21/2021 5.0  4.0 - 10.5 K/uL Final   RBC  01/21/2021 4.24  4.22 - 5.81 MIL/uL Final   Hemoglobin 01/21/2021 13.5  13.0 - 17.0 g/dL Final   HCT 16/01/9603 39.7  39.0 - 52.0 % Final   MCV 01/21/2021 93.6  80.0 - 100.0 fL Final   MCH 01/21/2021 31.8  26.0 - 34.0 pg Final   MCHC 01/21/2021 34.0  30.0 - 36.0 g/dL Final   RDW 54/12/8117 14.7  11.5 - 15.5 % Final   Platelets 01/21/2021 213  150 - 400 K/uL Final   nRBC 01/21/2021 0.0  0.0 - 0.2 % Final   Neutrophils Relative % 01/21/2021 52  % Final   Neutro Abs 01/21/2021 2.6  1.7 - 7.7 K/uL Final   Lymphocytes Relative 01/21/2021 36  % Final   Lymphs Abs 01/21/2021 1.8  0.7 - 4.0 K/uL Final   Monocytes Relative 01/21/2021 10  % Final   Monocytes Absolute 01/21/2021 0.5  0.1 - 1.0 K/uL Final   Eosinophils Relative 01/21/2021 2  % Final   Eosinophils Absolute 01/21/2021 0.1  0.0 - 0.5 K/uL Final   Basophils Relative 01/21/2021 0  % Final   Basophils Absolute 01/21/2021 0.0  0.0 - 0.1 K/uL Final   Immature Granulocytes 01/21/2021 0  % Final   Abs Immature Granulocytes 01/21/2021 0.01  0.00 - 0.07 K/uL Final   Performed at [facility], [address], [place], [place] [address]   Acetaminophen (Tylenol), Serum 01/21/2021 <10 (A) 10 - 30 ug/mL Final   Comment: (NOTE) Therapeutic concentrations vary significantly. A range of 10-30 ug/mL  may be an effective concentration  for many patients. However, some  are best treated at concentrations outside of this range. Acetaminophen concentrations >150 ug/mL at 4 hours after ingestion  and >50 ug/mL at 12 hours after ingestion are often associated with  toxic reactions.  Performed at [facility], [address], [place], [place] [address]    Salicylate Lvl 01/21/2021 <7.0 (A) 7.0 - 30.0 mg/dL Final   Performed at [facility], [address] [place], [place] [address]   Color, Urine 01/21/2021 COLORLESS (A) YELLOW Final   APPearance 01/21/2021 CLEAR  CLEAR Final   Specific Gravity, Urine 01/21/2021 1.001 (A) 1.005 - 1.030 Final   pH 01/21/2021 7.0  5.0 - 8.0 Final   Glucose, UA 01/21/2021 NEGATIVE  NEGATIVE mg/dL Final   Hgb urine dipstick 01/21/2021 NEGATIVE  NEGATIVE Final   Bilirubin Urine 01/21/2021 NEGATIVE  NEGATIVE Final   Ketones, ur 01/21/2021 NEGATIVE  NEGATIVE mg/dL Final   Protein, ur 78/46/9629 NEGATIVE  NEGATIVE mg/dL Final   Nitrite 52/84/1324 NEGATIVE  NEGATIVE Final   Leukocytes,Ua 01/21/2021 NEGATIVE  NEGATIVE Final   Performed at [facility], [address] [place], [place] [address]   Color, Urine 01/21/2021 STRAW (A) YELLOW Final   APPearance 01/21/2021 CLEAR  CLEAR Final   Specific Gravity, Urine 01/21/2021 1.011  1.005 - 1.030 Final   pH 01/21/2021 7.0  5.0 - 8.0 Final   Glucose, UA 01/21/2021 NEGATIVE  NEGATIVE mg/dL Final   Hgb urine dipstick 01/21/2021 NEGATIVE  NEGATIVE Final   Bilirubin Urine 01/21/2021 NEGATIVE  NEGATIVE Final   Ketones, ur 01/21/2021 NEGATIVE  NEGATIVE mg/dL Final   Protein, ur 72/53/6644 NEGATIVE  NEGATIVE mg/dL Final   Nitrite 03/47/4259 NEGATIVE  NEGATIVE Final   Leukocytes,Ua 01/21/2021 NEGATIVE  NEGATIVE Final   Performed at [facility], [address] [place], [place] [address]   Opiates 01/21/2021 NONE DETECTED  NONE  DETECTED Final   Cocaine 01/21/2021 NONE DETECTED  NONE  DETECTED Final   Benzodiazepines 01/21/2021 POSITIVE (A) NONE DETECTED Final   Amphetamines 01/21/2021 NONE DETECTED  NONE DETECTED Final   Tetrahydrocannabinol 01/21/2021 NONE DETECTED  NONE DETECTED Final   Barbiturates 01/21/2021 NONE DETECTED  NONE DETECTED Final   Comment: (NOTE) DRUG SCREEN FOR MEDICAL PURPOSES ONLY.  IF CONFIRMATION IS NEEDED FOR ANY PURPOSE, NOTIFY LAB WITHIN 5 DAYS.  LOWEST DETECTABLE LIMITS FOR URINE DRUG SCREEN Drug Class                     Cutoff (ng/mL) Amphetamine and metabolites    1000 Barbiturate and metabolites    200 Benzodiazepine                 200 Tricyclics and metabolites     300 Opiates and metabolites        300 Cocaine and metabolites        300 THC                            50 Performed at [facility], [address], [place], [place] [address]   Admission on 12/03/2020, Discharged on 01/05/2021  Component Date Value Ref Range Status   Hgb A1c MFr Bld 12/04/2020 5.5  4.8 - 5.6 % Final   Comment: (NOTE) Pre diabetes:          5.7%-6.4%  Diabetes:              >6.4%  Glycemic control for   <7.0% adults with diabetes    Mean Plasma Glucose 12/04/2020 111.15  mg/dL Final   Performed at [facility], [address], [place], [place] [address]   Sodium 12/05/2020 141  135 - 145 mmol/L Final   Potassium 12/05/2020 4.0  3.5 - 5.1 mmol/L Final   Chloride 12/05/2020 107  98 - 111 mmol/L Final   CO2 12/05/2020 25  22 - 32 mmol/L Final   Glucose, Bld 12/05/2020 94  70 - 99 mg/dL Final   Glucose reference range applies only to samples taken after fasting for at least 8 hours.   BUN 12/05/2020 11  6 - 20 mg/dL Final   Creatinine, Ser 12/05/2020 0.74  0.61 - 1.24 mg/dL Final   Calcium 09/81/1914 9.2  8.9 - 10.3 mg/dL Final   Total Protein 78/29/5621 7.4  6.5 - 8.1 g/dL Final   Albumin 30/86/5784 3.8  3.5 - 5.0 g/dL Final   AST 69/62/9528 42 (A) 15 - 41 U/L Final   ALT 12/05/2020 58 (A) 0 - 44 U/L Final    Alkaline Phosphatase 12/05/2020 88  38 - 126 U/L Final   Total Bilirubin 12/05/2020 0.5  0.3 - 1.2 mg/dL Final   GFR, Estimated 12/05/2020 >60  >60 mL/min Final   Comment: (NOTE) Calculated using the CKD-EPI Creatinine Equation (2021)    Anion gap 12/05/2020 9  5 - 15 Final   Performed at [facility], [address], [place], [place] [address]   Color, Urine 12/05/2020 YELLOW  YELLOW Final   APPearance 12/05/2020 CLEAR  CLEAR Final   Specific Gravity, Urine 12/05/2020 1.020  1.005 - 1.030 Final   pH 12/05/2020 5.0  5.0 - 8.0 Final   Glucose, UA 12/05/2020 NEGATIVE  NEGATIVE mg/dL Final   Hgb urine dipstick 12/05/2020 NEGATIVE  NEGATIVE Final   Bilirubin Urine 12/05/2020 NEGATIVE  NEGATIVE  Final   Ketones, ur 12/05/2020 NEGATIVE  NEGATIVE mg/dL Final   Protein, ur 16/01/9603 NEGATIVE  NEGATIVE mg/dL Final   Nitrite 54/12/8117 NEGATIVE  NEGATIVE Final   Leukocytes,Ua 12/05/2020 NEGATIVE  NEGATIVE Final   RBC / HPF 12/05/2020 0-5  0 - 5 RBC/hpf Final   WBC, UA 12/05/2020 0-5  0 - 5 WBC/hpf Final   Bacteria, UA 12/05/2020 NONE SEEN  NONE SEEN Final   Squamous Epithelial / LPF 12/05/2020 0-5  0 - 5 Final   Performed at [facility], [address] [place], [place] [address]   TSH 12/06/2020 5.767 (A) 0.350 - 4.500 uIU/mL Final   Comment: Performed by a 3rd Generation assay with a functional sensitivity of <=0.01 uIU/mL. Performed at [facility], [address], [place], [place] [address]    Color, Urine 12/11/2020 YELLOW  YELLOW Final   APPearance 12/11/2020 CLEAR  CLEAR Final   Specific Gravity, Urine 12/11/2020 1.018  1.005 - 1.030 Final   pH 12/11/2020 7.0  5.0 - 8.0 Final   Glucose, UA 12/11/2020 NEGATIVE  NEGATIVE mg/dL Final   Hgb urine dipstick 12/11/2020 NEGATIVE  NEGATIVE Final   Bilirubin Urine 12/11/2020 NEGATIVE  NEGATIVE Final   Ketones, ur 12/11/2020 NEGATIVE  NEGATIVE mg/dL Final   Protein, ur 30/86/5784  NEGATIVE  NEGATIVE mg/dL Final   Nitrite 69/62/9528 NEGATIVE  NEGATIVE Final   Leukocytes,Ua 12/11/2020 NEGATIVE  NEGATIVE Final   RBC / HPF 12/11/2020 0-5  0 - 5 RBC/hpf Final   WBC, UA 12/11/2020 0-5  0 - 5 WBC/hpf Final   Bacteria, UA 12/11/2020 NONE SEEN  NONE SEEN Final   Performed at [facility], [address] [place], [place] [address]   SARSCOV2ONAVIRUS 2 AG 12/22/2020 NEGATIVE  NEGATIVE Final   Comment: (NOTE) SARS-CoV-2 antigen NOT DETECTED.   Negative results are presumptive.  Negative results do not preclude SARS-CoV-2 infection and should not be used as the sole basis for treatment or other patient management decisions, including infection  control decisions, particularly in the presence of clinical signs and  symptoms consistent with COVID-19, or in those who have been in contact with the virus.  Negative results must be combined with clinical observations, patient history, and epidemiological information. The expected result is Negative.  Fact Sheet for Patients: https://www.jennings-kim.com/  Fact Sheet for Healthcare Providers: https://alexander-rogers.biz/  This test is not yet approved or cleared by the [place] FDA and  has been authorized for detection and/or diagnosis of SARS-CoV-2 by FDA under an Emergency Use Authorization (EUA).  This EUA will remain in effect (meaning this test can be used) for the duration of  the COV                          ID-19 declaration under Section 564(b)(1) of the Act, 21 U.S.C. section 360bbb-3(b)(1), unless the authorization is terminated or revoked sooner.     SARS Coronavirus 2 by RT PCR 12/25/2020 NEGATIVE  NEGATIVE Final   Comment: (NOTE) SARS-CoV-2 target nucleic acids are NOT DETECTED.  The SARS-CoV-2 RNA is generally detectable in upper respiratory specimens during the acute phase of infection. The lowest concentration of SARS-CoV-2 viral copies this assay can  detect is 138 copies/mL. A negative result does not preclude SARS-Cov-2 infection and should not be used as the sole basis for treatment or other patient management decisions. A negative result may occur with  improper specimen collection/handling, submission of specimen  other than nasopharyngeal swab, presence of viral mutation(s) within the areas targeted by this assay, and inadequate number of viral copies(<138 copies/mL). A negative result must be combined with clinical observations, patient history, and epidemiological information. The expected result is Negative.  Fact Sheet for Patients:  BloggerCourse.com  Fact Sheet for Healthcare Providers:  SeriousBroker.it  This test is no                          t yet approved or cleared by the [place] FDA and  has been authorized for detection and/or diagnosis of SARS-CoV-2 by FDA under an Emergency Use Authorization (EUA). This EUA will remain  in effect (meaning this test can be used) for the duration of the COVID-19 declaration under Section 564(b)(1) of the Act, 21 U.S.C.section 360bbb-3(b)(1), unless the authorization is terminated  or revoked sooner.       Influenza A by PCR 12/25/2020 NEGATIVE  NEGATIVE Final   Influenza B by PCR 12/25/2020 NEGATIVE  NEGATIVE Final   Comment: (NOTE) The Xpert Xpress SARS-CoV-2/FLU/RSV plus assay is intended as an aid in the diagnosis of influenza from Nasopharyngeal swab specimens and should not be used as a sole basis for treatment. Nasal washings and aspirates are unacceptable for Xpert Xpress SARS-CoV-2/FLU/RSV testing.  Fact Sheet for Patients: BloggerCourse.com  Fact Sheet for Healthcare Providers: SeriousBroker.it  This test is not yet approved or cleared by the [place] FDA and has been authorized for detection and/or diagnosis of SARS-CoV-2 by FDA under an Emergency  Use Authorization (EUA). This EUA will remain in effect (meaning this test can be used) for the duration of the COVID-19 declaration under Section 564(b)(1) of the Act, 21 U.S.C. section 360bbb-3(b)(1), unless the authorization is terminated or revoked.  Performed at [facility], [address] [place], [place] [address]    Sodium 12/26/2020 133 (A) 135 - 145 mmol/L Final   Potassium 12/26/2020 3.8  3.5 - 5.1 mmol/L Final   Chloride 12/26/2020 99  98 - 111 mmol/L Final   CO2 12/26/2020 22  22 - 32 mmol/L Final   Glucose, Bld 12/26/2020 114 (A) 70 - 99 mg/dL Final   Glucose reference range applies only to samples taken after fasting for at least 8 hours.   BUN 12/26/2020 13  6 - 20 mg/dL Final   Creatinine, Ser 12/26/2020 0.93  0.61 - 1.24 mg/dL Final   Calcium 60/45/4098 8.9  8.9 - 10.3 mg/dL Final   Total Protein 11/91/4782 7.5  6.5 - 8.1 g/dL Final   Albumin 95/62/1308 3.8  3.5 - 5.0 g/dL Final   AST 65/78/4696 40  15 - 41 U/L Final   ALT 12/26/2020 32  0 - 44 U/L Final   Alkaline Phosphatase 12/26/2020 83  38 - 126 U/L Final   Total Bilirubin 12/26/2020 0.6  0.3 - 1.2 mg/dL Final   GFR, Estimated 12/26/2020 >60  >60 mL/min Final   Comment: (NOTE) Calculated using the CKD-EPI Creatinine Equation (2021)    Anion gap 12/26/2020 12  5 - 15 Final   Performed at [facility], [address] [place], [place] [address]   Free T4 12/26/2020 0.77  0.61 - 1.12 ng/dL Final   Comment: (NOTE) Biotin ingestion may interfere with free T4 tests. If the results are inconsistent with the TSH level, previous test results, or the clinical presentation, then consider biotin interference. If needed, order repeat testing after stopping biotin. Performed at  [facility], [address], [place], [place] [address]    TSH 12/26/2020 2.679  0.350 - 4.500 uIU/mL Final   Comment: Performed by a 3rd Generation assay with a functional sensitivity of <=0.01  uIU/mL. Performed at [facility], [address], [place], [place] [address]    SARS Coronavirus 2 by RT PCR 12/27/2020 POSITIVE (A) NEGATIVE Final   Comment: RESULT CALLED TO, READ BACK BY AND VERIFIED WITH: Vivia Birmingham. RN ON 12/27/2020 @ 0905 BY MECIAL J. (NOTE) SARS-CoV-2 target nucleic acids are DETECTED.  The SARS-CoV-2 RNA is generally detectable in upper respiratory specimens during the acute phase of infection. Positive results are indicative of the presence of the identified virus, but do not rule out bacterial infection or co-infection with other pathogens not detected by the test. Clinical correlation with patient history and other diagnostic information is necessary to determine patient infection status. The expected result is Negative.  Fact Sheet for Patients: BloggerCourse.com  Fact Sheet for Healthcare Providers: SeriousBroker.it  This test is not yet approved or cleared by the [place] FDA and  has been authorized for detection and/or diagnosis of SARS-CoV-2 by FDA under an Emergency Use Authorization (EUA).  This EUA will remain in effect (meaning thi                          s test can be used) for the duration of  the COVID-19 declaration under Section 564(b)(1) of the Act, 21 U.S.C. section 360bbb-3(b)(1), unless the authorization is terminated or revoked sooner.     Influenza A by PCR 12/27/2020 NEGATIVE  NEGATIVE Final   Influenza B by PCR 12/27/2020 NEGATIVE  NEGATIVE Final   Comment: (NOTE) The Xpert Xpress SARS-CoV-2/FLU/RSV plus assay is intended as an aid in the diagnosis of influenza from Nasopharyngeal swab specimens and should not be used as a sole basis for treatment. Nasal washings and aspirates are unacceptable for Xpert Xpress SARS-CoV-2/FLU/RSV testing.  Fact Sheet for Patients: BloggerCourse.com  Fact Sheet for Healthcare  Providers: SeriousBroker.it  This test is not yet approved or cleared by the [place] FDA and has been authorized for detection and/or diagnosis of SARS-CoV-2 by FDA under an Emergency Use Authorization (EUA). This EUA will remain in effect (meaning this test can be used) for the duration of the COVID-19 declaration under Section 564(b)(1) of the Act, 21 U.S.C. section 360bbb-3(b)(1), unless the authorization is terminated or revoked.  Performed at [facility], [address], [place], [place] [address]    Sodium 01/04/2021 138  135 - 145 mmol/L Final   Potassium 01/04/2021 3.8  3.5 - 5.1 mmol/L Final   Chloride 01/04/2021 102  98 - 111 mmol/L Final   CO2 01/04/2021 27  22 - 32 mmol/L Final   Glucose, Bld 01/04/2021 107 (A) 70 - 99 mg/dL Final   Glucose reference range applies only to samples taken after fasting for at least 8 hours.   BUN 01/04/2021 16  6 - 20 mg/dL Final   Creatinine, Ser 01/04/2021 0.98  0.61 - 1.24 mg/dL Final   Calcium 91/47/8295 9.5  8.9 - 10.3 mg/dL Final   GFR, Estimated 01/04/2021 >60  >60 mL/min Final   Comment: (NOTE) Calculated using the CKD-EPI Creatinine Equation (2021)    Anion gap 01/04/2021 9  5 - 15 Final   Performed at [facility], [address] [place], [place] [address]  Admission on 11/29/2020, Discharged  on 12/03/2020  Component Date Value Ref Range Status   Sodium 11/29/2020 136  135 - 145 mmol/L Final   Potassium 11/29/2020 4.0  3.5 - 5.1 mmol/L Final   Chloride 11/29/2020 105  98 - 111 mmol/L Final   CO2 11/29/2020 22  22 - 32 mmol/L Final   Glucose, Bld 11/29/2020 85  70 - 99 mg/dL Final   Glucose reference range applies only to samples taken after fasting for at least 8 hours.   BUN 11/29/2020 14  6 - 20 mg/dL Final   Creatinine, Ser 11/29/2020 0.99  0.61 - 1.24 mg/dL Final   Calcium 56/21/3086 9.6  8.9 - 10.3 mg/dL Final   Total Protein 57/84/6962 7.7  6.5 -  8.1 g/dL Final   Albumin 95/28/4132 4.0  3.5 - 5.0 g/dL Final   AST 44/04/270 57 (A) 15 - 41 U/L Final   ALT 11/29/2020 54 (A) 0 - 44 U/L Final   Alkaline Phosphatase 11/29/2020 101  38 - 126 U/L Final   Total Bilirubin 11/29/2020 0.5  0.3 - 1.2 mg/dL Final   GFR, Estimated 11/29/2020 >60  >60 mL/min Final   Comment: (NOTE) Calculated using the CKD-EPI Creatinine Equation (2021)    Anion gap 11/29/2020 9  5 - 15 Final   Performed at [facility], [address], [place], [place] [address]   WBC 11/29/2020 5.5  4.0 - 10.5 K/uL Final   RBC 11/29/2020 4.59  4.22 - 5.81 MIL/uL Final   Hemoglobin 11/29/2020 14.4  13.0 - 17.0 g/dL Final   HCT 40/34/7425 42.2  39.0 - 52.0 % Final   MCV 11/29/2020 91.9  80.0 - 100.0 fL Final   MCH 11/29/2020 31.4  26.0 - 34.0 pg Final   MCHC 11/29/2020 34.1  30.0 - 36.0 g/dL Final   RDW 95/63/8756 13.8  11.5 - 15.5 % Final   Platelets 11/29/2020 319  150 - 400 K/uL Final   nRBC 11/29/2020 0.0  0.0 - 0.2 % Final   Neutrophils Relative % 11/29/2020 54  % Final   Neutro Abs 11/29/2020 3.0  1.7 - 7.7 K/uL Final   Lymphocytes Relative 11/29/2020 36  % Final   Lymphs Abs 11/29/2020 2.0  0.7 - 4.0 K/uL Final   Monocytes Relative 11/29/2020 8  % Final   Monocytes Absolute 11/29/2020 0.4  0.1 - 1.0 K/uL Final   Eosinophils Relative 11/29/2020 2  % Final   Eosinophils Absolute 11/29/2020 0.1  0.0 - 0.5 K/uL Final   Basophils Relative 11/29/2020 0  % Final   Basophils Absolute 11/29/2020 0.0  0.0 - 0.1 K/uL Final   Immature Granulocytes 11/29/2020 0  % Final   Abs Immature Granulocytes 11/29/2020 0.01  0.00 - 0.07 K/uL Final   Performed at [facility], [address], [place], [place] [address]   Opiates 11/29/2020 NONE DETECTED  NONE DETECTED Final   Cocaine 11/29/2020 NONE DETECTED  NONE DETECTED Final   Benzodiazepines 11/29/2020 NONE DETECTED  NONE DETECTED Final   Amphetamines 11/29/2020 NONE DETECTED  NONE DETECTED Final   Tetrahydrocannabinol  11/29/2020 NONE DETECTED  NONE DETECTED Final   Barbiturates 11/29/2020 NONE DETECTED  NONE DETECTED Final   Comment: (NOTE) DRUG SCREEN FOR MEDICAL PURPOSES ONLY.  IF CONFIRMATION IS NEEDED FOR ANY PURPOSE, NOTIFY LAB WITHIN 5 DAYS.  LOWEST DETECTABLE LIMITS FOR URINE DRUG SCREEN Drug Class  Cutoff (ng/mL) Amphetamine and metabolites    1000 Barbiturate and metabolites    200 Benzodiazepine                 200 Tricyclics and metabolites     300 Opiates and metabolites        300 Cocaine and metabolites        300 THC                            50 Performed at [facility], [address] [place], [place] [address]    Alcohol, Ethyl (B) 11/29/2020 <10  <10 mg/dL Final   Comment: (NOTE) Lowest detectable limit for serum alcohol is 10 mg/dL.  For medical purposes only. Performed at [facility], [address] [place], [place] [address]    Acetaminophen (Tylenol), Serum 11/29/2020 <10 (A) 10 - 30 ug/mL Final   Comment: (NOTE) Therapeutic concentrations vary significantly. A range of 10-30 ug/mL  may be an effective concentration for many patients. However, some  are best treated at concentrations outside of this range. Acetaminophen concentrations >150 ug/mL at 4 hours after ingestion  and >50 ug/mL at 12 hours after ingestion are often associated with  toxic reactions.  Performed at [facility], [address] [place], [place] [address]    Salicylate Lvl 11/29/2020 <7.0 (A) 7.0 - 30.0 mg/dL Final   Performed at [facility], [address] [place], [place] [address]   SARS Coronavirus 2 by RT PCR 11/29/2020 NEGATIVE  NEGATIVE Final   Comment: (NOTE) SARS-CoV-2 target nucleic acids are NOT DETECTED.  The SARS-CoV-2 RNA is generally detectable in upper respiratory specimens during the acute phase of infection. The lowest concentration of SARS-CoV-2 viral copies this assay can detect is 138 copies/mL. A negative result does  not preclude SARS-Cov-2 infection and should not be used as the sole basis for treatment or other patient management decisions. A negative result may occur with  improper specimen collection/handling, submission of specimen other than nasopharyngeal swab, presence of viral mutation(s) within the areas targeted by this assay, and inadequate number of viral copies(<138 copies/mL). A negative result must be combined with clinical observations, patient history, and epidemiological information. The expected result is Negative.  Fact Sheet for Patients:  BloggerCourse.com  Fact Sheet for Healthcare Providers:  SeriousBroker.it  This test is no                          t yet approved or cleared by the [place] FDA and  has been authorized for detection and/or diagnosis of SARS-CoV-2 by FDA under an Emergency Use Authorization (EUA). This EUA will remain  in effect (meaning this test can be used) for the duration of the COVID-19 declaration under Section 564(b)(1) of the Act, 21 U.S.C.section 360bbb-3(b)(1), unless the authorization is terminated  or revoked sooner.       Influenza A by PCR 11/29/2020 NEGATIVE  NEGATIVE Final   Influenza B by PCR 11/29/2020 NEGATIVE  NEGATIVE Final   Comment: (NOTE) The Xpert Xpress SARS-CoV-2/FLU/RSV plus assay is intended as an aid in the diagnosis of influenza from Nasopharyngeal swab specimens and should not be used as a sole basis for treatment. Nasal washings and aspirates are unacceptable for Xpert Xpress SARS-CoV-2/FLU/RSV testing.  Fact Sheet for Patients: BloggerCourse.com  Fact Sheet for Healthcare Providers: SeriousBroker.it  This test is not  yet approved or cleared by the [place] and has been authorized for detection and/or diagnosis of SARS-CoV-2 by FDA under an Emergency Use Authorization (EUA). This EUA will remain in  effect (meaning this test can be used) for the duration of the COVID-19 declaration under Section 564(b)(1) of the Act, 21 U.S.C. section 360bbb-3(b)(1), unless the authorization is terminated or revoked.  Performed at [facility], [address], [place], [place] [address]    Total CK 12/01/2020 1,423 (A) 49 - 397 U/L Final   Performed at [facility], [address], [place], [place] [address]   WBC 12/02/2020 5.8  4.0 - 10.5 K/uL Final   RBC 12/02/2020 4.59  4.22 - 5.81 MIL/uL Final   Hemoglobin 12/02/2020 14.6  13.0 - 17.0 g/dL Final   HCT 42/35/3614 42.7  39.0 - 52.0 % Final   MCV 12/02/2020 93.0  80.0 - 100.0 fL Final   MCH 12/02/2020 31.8  26.0 - 34.0 pg Final   MCHC 12/02/2020 34.2  30.0 - 36.0 g/dL Final   RDW 43/15/4008 14.2  11.5 - 15.5 % Final   Platelets 12/02/2020 300  150 - 400 K/uL Final   nRBC 12/02/2020 0.0  0.0 - 0.2 % Final   Neutrophils Relative % 12/02/2020 51  % Final   Neutro Abs 12/02/2020 2.9  1.7 - 7.7 K/uL Final   Lymphocytes Relative 12/02/2020 37  % Final   Lymphs Abs 12/02/2020 2.2  0.7 - 4.0 K/uL Final   Monocytes Relative 12/02/2020 9  % Final   Monocytes Absolute 12/02/2020 0.5  0.1 - 1.0 K/uL Final   Eosinophils Relative 12/02/2020 3  % Final   Eosinophils Absolute 12/02/2020 0.2  0.0 - 0.5 K/uL Final   Basophils Relative 12/02/2020 0  % Final   Basophils Absolute 12/02/2020 0.0  0.0 - 0.1 K/uL Final   Immature Granulocytes 12/02/2020 0  % Final   Abs Immature Granulocytes 12/02/2020 0.01  0.00 - 0.07 K/uL Final   Performed at [facility], [address], [place], [place] [address]   Sodium 12/02/2020 138  135 - 145 mmol/L Final   Potassium 12/02/2020 4.1  3.5 - 5.1 mmol/L Final   Chloride 12/02/2020 107  98 - 111 mmol/L Final   CO2 12/02/2020 23  22 - 32 mmol/L Final   Glucose, Bld 12/02/2020 105 (A) 70 - 99 mg/dL Final   Glucose reference range applies only to samples taken after fasting for at least 8 hours.   BUN  12/02/2020 12  6 - 20 mg/dL Final   Creatinine, Ser 12/02/2020 1.10  0.61 - 1.24 mg/dL Final   Calcium 50/93/2671 9.5  8.9 - 10.3 mg/dL Final   Total Protein 24/58/0998 7.7  6.5 - 8.1 g/dL Final   Albumin 33/82/5053 4.0  3.5 - 5.0 g/dL Final   AST 97/67/3419 70 (A) 15 - 41 U/L Final   ALT 12/02/2020 85 (A) 0 - 44 U/L Final   Alkaline Phosphatase 12/02/2020 97  38 - 126 U/L Final   Total Bilirubin 12/02/2020 0.9  0.3 - 1.2 mg/dL Final   GFR, Estimated 12/02/2020 >60  >60 mL/min Final   Comment: (NOTE) Calculated using the CKD-EPI Creatinine Equation (2021)    Anion gap 12/02/2020 8  5 - 15 Final   Performed at [facility], [address], [place], [place] [address]   Total CK 12/02/2020 1,646 (A) 49 - 397 U/L Final   Performed at  [facility], [address] [place] [place], [place] [address]   Cholesterol 12/02/2020 123  0 - 200 mg/dL Final   Triglycerides 34/28/7681 74  <150 mg/dL Final   HDL 15/72/6203 35 (A) >40 mg/dL Final   Total CHOL/HDL Ratio 12/02/2020 3.5  RATIO Final   VLDL 12/02/2020 15  0 - 40 mg/dL Final   LDL Cholesterol 12/02/2020 73  0 - 99 mg/dL Final   Comment:        Total Cholesterol/HDL:CHD Risk Coronary Heart Disease Risk Table                     Men   Women  1/2 Average Risk   3.4   3.3  Average Risk       5.0   4.4  2 X Average Risk   9.6   7.1  3 X Average Risk  23.4   11.0        Use the calculated Patient Ratio above and the CHD Risk Table to determine the patient's CHD Risk.        ATP III CLASSIFICATION (LDL):  <100     mg/dL   Optimal  559-741  mg/dL   Near or Above                    Optimal  130-159  mg/dL   Borderline  638-453  mg/dL   High  >646     mg/dL   Very High Performed at [facility], [address], [place], [place] [address]    Troponin I (High Sensitivity) 12/02/2020 5  <18 ng/L Final   Comment: (NOTE) Elevated high sensitivity troponin I (hsTnI) values and significant  changes across serial measurements may  suggest ACS but many other  chronic and acute conditions are known to elevate hsTnI results.  Refer to the "Links" section for chest pain algorithms and additional  guidance. Performed at [facility], [address] [place], [place] [address]    SARS Coronavirus 2 by RT PCR 12/03/2020 NEGATIVE  NEGATIVE Final   Comment: (NOTE) SARS-CoV-2 target nucleic acids are NOT DETECTED.  The SARS-CoV-2 RNA is generally detectable in upper respiratory specimens during the acute phase of infection. The lowest concentration of SARS-CoV-2 viral copies this assay can detect is 138 copies/mL. A negative result does not preclude SARS-Cov-2 infection and should not be used as the sole basis for treatment or other patient management decisions. A negative result may occur with  improper specimen collection/handling, submission of specimen other than nasopharyngeal swab, presence of viral mutation(s) within the areas targeted by this assay, and inadequate number of viral copies(<138 copies/mL). A negative result must be combined with clinical observations, patient history, and epidemiological information. The expected result is Negative.  Fact Sheet for Patients:  BloggerCourse.com  Fact Sheet for Healthcare Providers:  SeriousBroker.it  This test is no                          t yet approved or cleared by the [place] FDA and  has been authorized for detection and/or diagnosis of SARS-CoV-2 by FDA under an Emergency Use Authorization (EUA). This EUA will remain  in effect (meaning this test can be used) for the duration of the COVID-19 declaration under Section 564(b)(1) of the Act, 21 U.S.C.section 360bbb-3(b)(1), unless the authorization is terminated  or revoked sooner.       Influenza  A by PCR 12/03/2020 NEGATIVE  NEGATIVE Final   Influenza B by PCR 12/03/2020 NEGATIVE  NEGATIVE Final   Comment: (NOTE) The Xpert Xpress  SARS-CoV-2/FLU/RSV plus assay is intended as an aid in the diagnosis of influenza from Nasopharyngeal swab specimens and should not be used as a sole basis for treatment. Nasal washings and aspirates are unacceptable for Xpert Xpress SARS-CoV-2/FLU/RSV testing.  Fact Sheet for Patients: BloggerCourse.com  Fact Sheet for Healthcare Providers: SeriousBroker.it  This test is not yet approved or cleared by the [place] FDA and has been authorized for detection and/or diagnosis of SARS-CoV-2 by FDA under an Emergency Use Authorization (EUA). This EUA will remain in effect (meaning this test can be used) for the duration of the COVID-19 declaration under Section 564(b)(1) of the Act, 21 U.S.C. section 360bbb-3(b)(1), unless the authorization is terminated or revoked.  Performed at [facility], [address], [place], [place] [address]    Glucose-Capillary 12/03/2020 96  70 - 99 mg/dL Final   Glucose reference range applies only to samples taken after fasting for at least 8 hours.  Admission on 11/07/2020, Discharged on 11/10/2020  Component Date Value Ref Range Status   SARS Coronavirus 2 by RT PCR 11/07/2020 NEGATIVE  NEGATIVE Final   Comment: (NOTE) SARS-CoV-2 target nucleic acids are NOT DETECTED.  The SARS-CoV-2 RNA is generally detectable in upper respiratory specimens during the acute phase of infection. The lowest concentration of SARS-CoV-2 viral copies this assay can detect is 138 copies/mL. A negative result does not preclude SARS-Cov-2 infection and should not be used as the sole basis for treatment or other patient management decisions. A negative result may occur with  improper specimen collection/handling, submission of specimen other than nasopharyngeal swab, presence of viral mutation(s) within the areas targeted by this assay, and inadequate number of viral copies(<138 copies/mL). A negative result must  be combined with clinical observations, patient history, and epidemiological information. The expected result is Negative.  Fact Sheet for Patients:  BloggerCourse.com  Fact Sheet for Healthcare Providers:  SeriousBroker.it  This test is no                          t yet approved or cleared by the [place] FDA and  has been authorized for detection and/or diagnosis of SARS-CoV-2 by FDA under an Emergency Use Authorization (EUA). This EUA will remain  in effect (meaning this test can be used) for the duration of the COVID-19 declaration under Section 564(b)(1) of the Act, 21 U.S.C.section 360bbb-3(b)(1), unless the authorization is terminated  or revoked sooner.       Influenza A by PCR 11/07/2020 NEGATIVE  NEGATIVE Final   Influenza B by PCR 11/07/2020 NEGATIVE  NEGATIVE Final   Comment: (NOTE) The Xpert Xpress SARS-CoV-2/FLU/RSV plus assay is intended as an aid in the diagnosis of influenza from Nasopharyngeal swab specimens and should not be used as a sole basis for treatment. Nasal washings and aspirates are unacceptable for Xpert Xpress SARS-CoV-2/FLU/RSV testing.  Fact Sheet for Patients: BloggerCourse.com  Fact Sheet for Healthcare Providers: SeriousBroker.it  This test is not yet approved or cleared by the [place] FDA and has been authorized for detection and/or diagnosis of SARS-CoV-2 by FDA under an Emergency Use Authorization (EUA). This EUA will remain in effect (meaning this test can be used) for the duration of the COVID-19 declaration under Section 564(b)(1) of the Act, 21 U.S.C. section 360bbb-3(b)(1), unless the authorization is  terminated or revoked.  Performed at [facility], [address] [place], [place] [address]    Sodium 11/07/2020 142  135 - 145 mmol/L Final   Potassium 11/07/2020 4.2  3.5 - 5.1 mmol/L Final   Chloride  11/07/2020 106  98 - 111 mmol/L Final   CO2 11/07/2020 18 (A) 22 - 32 mmol/L Final   Glucose, Bld 11/07/2020 94  70 - 99 mg/dL Final   Glucose reference range applies only to samples taken after fasting for at least 8 hours.   BUN 11/07/2020 20  6 - 20 mg/dL Final   Creatinine, Ser 11/07/2020 2.65 (A) 0.61 - 1.24 mg/dL Final   Calcium 60/45/4098 10.0  8.9 - 10.3 mg/dL Final   Total Protein 11/91/4782 8.6 (A) 6.5 - 8.1 g/dL Final   Albumin 95/62/1308 4.7  3.5 - 5.0 g/dL Final   AST 65/78/4696 181 (A) 15 - 41 U/L Final   ALT 11/07/2020 77 (A) 0 - 44 U/L Final   Alkaline Phosphatase 11/07/2020 66  38 - 126 U/L Final   Total Bilirubin 11/07/2020 2.0 (A) 0.3 - 1.2 mg/dL Final   GFR, Estimated 11/07/2020 33 (A) >60 mL/min Final   Comment: (NOTE) Calculated using the CKD-EPI Creatinine Equation (2021)    Anion gap 11/07/2020 18 (A) 5 - 15 Final   Performed at [facility], [address], [place], [place] [address]   Alcohol, Ethyl (B) 11/07/2020 <10  <10 mg/dL Final   Comment: (NOTE) Lowest detectable limit for serum alcohol is 10 mg/dL.  For medical purposes only. Performed at [facility], [address] [place], [place] [address]    Opiates 11/07/2020 NONE DETECTED  NONE DETECTED Final   Cocaine 11/07/2020 NONE DETECTED  NONE DETECTED Final   Benzodiazepines 11/07/2020 NONE DETECTED  NONE DETECTED Final   Amphetamines 11/07/2020 NONE DETECTED  NONE DETECTED Final   Tetrahydrocannabinol 11/07/2020 NONE DETECTED  NONE DETECTED Final   Barbiturates 11/07/2020 NONE DETECTED  NONE DETECTED Final   Comment: (NOTE) DRUG SCREEN FOR MEDICAL PURPOSES ONLY.  IF CONFIRMATION IS NEEDED FOR ANY PURPOSE, NOTIFY LAB WITHIN 5 DAYS.  LOWEST DETECTABLE LIMITS FOR URINE DRUG SCREEN Drug Class                     Cutoff (ng/mL) Amphetamine and metabolites    1000 Barbiturate and metabolites    200 Benzodiazepine                 200 Tricyclics and metabolites     300 Opiates and  metabolites        300 Cocaine and metabolites        300 THC                            50 Performed at [facility], [address], [place], [place] [address]    WBC 11/07/2020 11.9 (A) 4.0 - 10.5 K/uL Final   RBC 11/07/2020 4.66  4.22 - 5.81 MIL/uL Final   Hemoglobin 11/07/2020 14.6  13.0 - 17.0 g/dL Final   HCT 72/53/6644 43.2  39.0 - 52.0 % Final   MCV 11/07/2020 92.7  80.0 - 100.0 fL Final   MCH 11/07/2020 31.3  26.0 - 34.0 pg Final   MCHC 11/07/2020 33.8  30.0 - 36.0 g/dL Final   RDW 03/47/4259 14.0  11.5 - 15.5 % Final   Platelets 11/07/2020 254  150 - 400 K/uL Final   nRBC 11/07/2020 0.0  0.0 - 0.2 % Final   Neutrophils Relative % 11/07/2020 80  % Final   Neutro Abs 11/07/2020 9.6 (A) 1.7 - 7.7 K/uL Final   Lymphocytes Relative 11/07/2020 11  % Final   Lymphs Abs 11/07/2020 1.3  0.7 - 4.0 K/uL Final   Monocytes Relative 11/07/2020 9  % Final   Monocytes Absolute 11/07/2020 1.1 (A) 0.1 - 1.0 K/uL Final   Eosinophils Relative 11/07/2020 0  % Final   Eosinophils Absolute 11/07/2020 0.0  0.0 - 0.5 K/uL Final   Basophils Relative 11/07/2020 0  % Final   Basophils Absolute 11/07/2020 0.0  0.0 - 0.1 K/uL Final   Immature Granulocytes 11/07/2020 0  % Final   Abs Immature Granulocytes 11/07/2020 0.04  0.00 - 0.07 K/uL Final   Performed at [facility], [address], [place], [place] [address]   Acetaminophen (Tylenol), Serum 11/07/2020 <10 (A) 10 - 30 ug/mL Final   Comment: (NOTE) Therapeutic concentrations vary significantly. A range of 10-30 ug/mL  may be an effective concentration for many patients. However, some  are best treated at concentrations outside of this range. Acetaminophen concentrations >150 ug/mL at 4 hours after ingestion  and >50 ug/mL at 12 hours after ingestion are often associated with  toxic reactions.  Performed at [facility], [address], [place], [place] [address]    Salicylate Lvl 11/07/2020 <7.0 (A) 7.0 - 30.0 mg/dL Final    Performed at [facility], [address] [place], [place] [address]   Total CK 11/07/2020 10,304 (A) 49 - 397 U/L Final   Comment: RESULTS CONFIRMED BY MANUAL DILUTION Performed at [facility], [address] [place], [place] [address]    Sodium 11/08/2020 134 (A) 135 - 145 mmol/L Final   Potassium 11/08/2020 3.4 (A) 3.5 - 5.1 mmol/L Final   Chloride 11/08/2020 103  98 - 111 mmol/L Final   CO2 11/08/2020 20 (A) 22 - 32 mmol/L Final   Glucose, Bld 11/08/2020 87  70 - 99 mg/dL Final   Glucose reference range applies only to samples taken after fasting for at least 8 hours.   BUN 11/08/2020 19  6 - 20 mg/dL Final   Creatinine, Ser 11/08/2020 1.61 (A) 0.61 - 1.24 mg/dL Final   DELTA CHECK NOTED   Calcium 11/08/2020 8.9  8.9 - 10.3 mg/dL Final   GFR, Estimated 11/08/2020 >60  >60 mL/min Final   Comment: (NOTE) Calculated using the CKD-EPI Creatinine Equation (2021)    Anion gap 11/08/2020 11  5 - 15 Final   Performed at [facility], [address], [place], [place] [address]   Total CK 11/08/2020 [phone] (A) 49 - 397 U/L Final   Comment: RESULTS CONFIRMED BY MANUAL DILUTION Performed at [facility], [address] [place], [place] [address]    Color, Urine 11/08/2020 YELLOW  YELLOW Final   APPearance 11/08/2020 CLEAR  CLEAR Final   Specific Gravity, Urine 11/08/2020 1.011  1.005 - 1.030 Final   pH 11/08/2020 6.0  5.0 - 8.0 Final   Glucose, UA 11/08/2020 NEGATIVE  NEGATIVE mg/dL Final   Hgb urine dipstick 11/08/2020 MODERATE (A) NEGATIVE Final   Bilirubin Urine 11/08/2020 NEGATIVE  NEGATIVE Final   Ketones, ur 11/08/2020 NEGATIVE  NEGATIVE mg/dL Final   Protein, ur 84/16/6063 NEGATIVE  NEGATIVE mg/dL Final   Nitrite 01/60/1093 NEGATIVE  NEGATIVE Final  Leukocytes,Ua 11/08/2020 NEGATIVE  NEGATIVE Final   RBC / HPF 11/08/2020 0-5  0 - 5 RBC/hpf Final   WBC, UA 11/08/2020 0-5  0 - 5 WBC/hpf Final   Bacteria, UA 11/08/2020 RARE (A) NONE SEEN Final    Squamous Epithelial / LPF 11/08/2020 0-5  0 - 5 Final   Hyaline Casts, UA 11/08/2020 PRESENT   Final   Performed at [facility], [address] [place], [place] [address]    Allergies: Patient has no known allergies.  PTA Medications: (Not in a hospital admission)   Medical Decision Making  Patient is a 25 year old male with past psychiatric and medical history as stated above who presents to the [facility] behavioral health urgent care via law enforcement under IVC (see HPI for details).  Based on patient's history, patient's current presentation and information in patient's IVC paperwork regarding safety concerns concerning the patient, believe that the patient is a potential threat to himself and others at this time and recommend continuous assessment for the patient at this time.    Recommendations  Based on my evaluation the patient does not appear to have an emergency medical condition.  Although patient has made statements about overdose (see HPI for details), based on patient's current presentation and lack of physical symptoms, I do not suspect that the patient has actually overdosed on any medications within the past 24 hours and do not believe that the patient is experiencing an emergency medical condition that requires further work-up at this time. Patient will be placed in [facility] continuous assessment for further crisis stabilization and treatment.  Patient will be reevaluated by the treatment team on 01/30/2021 and disposition to be determined at that time.  First exam for patient's IVC paperwork to be completed at that time of 01/30/2021 reevaluation as well.  Labs ordered and reviewed:  -PCR flu A &B, COVID: Collected, results pending  -UDS: Patient unable to provide urine specimen at this time.  Recommend that patient be encouraged by nursing staff during the day to provide urine specimen.  Patient's most recent 01/26/2021 UDS reviewed which was positive for  benzodiazepines.   -CBC with differential, CMP, valproic acid level (valproic acid level ordered due to patient stating that he takes Depakote although I do not see evidence of Depakote prescription per my chart review): Nursing staff unable to obtain blood draw on patient at this time.  Recommend that nursing staff attempt to obtain blood draw on the patient during the day on 01/30/2021 if possible.  Patient's 01/26/2021 CBC and CMP were reviewed.  01/26/2021 CBC with differential within normal limits.  01/26/2021 CMP showed slight elevation of glucose at 104 mg/dL and elevation of AST at 53 U/L.  Compared to previous lab values, patient's AST appears to be similar compared to levels from 8 to 9 days ago.  CMP otherwise unremarkable.   12/26/2020 TSH value reviewed which was within normal limits at 2.679 uIU/mL.  12/04/2020 hemoglobin A1c value reviewed which was within normal limits at 5.5%. 11/1320 lipid panel reviewed which showed slightly reduced HDL cholesterol at 35 mg/dL.  Patient's 01/27/21 EKG reviewed due to patient's history of taking antipsychotic medications, which showed no acute/concerning findings with QT/QTC of 310/434 ms, which is appropriate for continuation of antipsychotic medication at this time.  We will continue patient's home medication of metformin 500 mg p.o. twice daily with meals for antipsychotic therapy induced weight gain  Patient is a very poor historian regarding his psychotropic medications.  Due  to patient being a poor historian regarding his medications, apparent discrepancies in patient's medications per my chart review (see HPI for details), and inability to get in contact with patient's mother to obtain information about patient's medications/to properly verify patient's home medications, will not initiate home psychotropic medications for the patient at this time.  Recommend that dayshift treatment team/pharmacy contact patient's mother during the day today on 01/30/2021  in order to properly reconcile patient's home medication regimen.  One-time dose of Ativan 2 mg p.o. ordered and administered to the patient for agitation and anxiety without complication.  Vistaril 25 mg p.o. 3 times daily as needed for anxiety.  Jaclyn Shaggy, PA-C 01/30/21  3:37 AM

## 2021-01-30 NOTE — ED Notes (Signed)
Pt has been brought unto the unit. Pt is lying in bed with eyes closed, respirations are even/unlabored, environment check complete, will continue to monitor patient for safety

## 2021-01-30 NOTE — ED Notes (Signed)
MAKING UNCOMFORTABLE EXPLICIT STATEMENTS TOWARD STAFF. REDIRECTED.

## 2021-01-30 NOTE — Progress Notes (Signed)
[employer] arrived to this facility and patient placed under IVC papers secured by his mother.
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
## 2021-01-30 NOTE — Progress Notes (Signed)
Pt cooperative with all requests.  This writer attempted to draw blood labs and was unsuccessful.  COVID swabs obtained with POC result of negative.

## 2021-01-30 NOTE — ED Notes (Signed)
Pt ambulatory, alert, and oriented X4 on and off the unit. Education, support, and encouragement provided. Discharge summary/AVS, prescriptions, medications, and follow up appointments reviewed with pt and given to pt. Suicide prevention resources provided. Pt's belongings in locker returned and belongings sheet signed. Pt denies SI/HI, A/VH, pain, or any concerns at this time. Pt discharged to lobby to Safe Transport to be transported home.

## 2021-01-30 NOTE — ED Provider Notes (Signed)
FBC/OBS ASAP Discharge Summary  Date and Time: 01/30/2021 12:36 PM  Name: Noah Ramsey  MRN:  [id number]   Discharge Diagnoses:  Final diagnoses:  Schizophrenia, schizoaffective, chronic with acute exacerbation (HCC)  Aggressive behavior of adult    Subjective: "My moms' boy friend started fighting for nothing.  I had to call the police."   Noah Ramsey, 25 y.o., male patient was admitted to [facility] after presenting via law enforcement under IVC petition by his mother.  Per IVC " respondent has been diagnosed with schizophrenia and is medicated with diazepam and Haldol.  Mother administers medication to respond to daily and he is current.  He is not sleeping though and mother states that since he got out of the hospital last month (for IVC) his behavior has become increasingly worse and more violent.  A few days ago responded show mother a pitcher of rat poison and told her he wanted to drink it.  Responded has been talking to himself, laughing and pacing back-and-forth all hours of the day and night.  He has been trying to fight family members, throwing things at them and breaking personal property.  Tonight he called the police twice on his family members for no reason.  Family is very fearful of respondent and what he may do next are due to them."  Noah Ramsey seen face to face by this provider, consulted with Dr. Earlene Plater; and chart reviewed on 01/30/21.  On evaluation Noah Ramsey reports he feels fine.  Patient reports he was brought back to the hospital by police after calling the police himself related to an altercation with his mother's boyfriend.  "He went out for no reason and said I should be in prison.  He was acting irate and cantankerous that means really angry.  He was threatening.  My mom was really paranoid so I called the police."  Patient denies suicidal/self-harm/homicidal ideations, psychosis, paranoia.  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  Patient also stated that he and his mother are planning to move to [place]. During evaluation Noah Ramsey is sitting on side of bed in no acute distress.  He is alert, oriented x 3, calm, cooperative and attentive.  His mood is euthymic with congruent affect.  He has normal speech, and behavior.  Objectively there is no evidence of psychosis/mania or delusional thinking.  Patient is able to converse coherently, goal directed thoughts, no distractibility, or pre-occupation.  He also denies suicidal/self-harm/homicidal ideation, psychosis, and paranoia.  Patient answered question appropriately.   There have been no behavioral outburst and patient has acted appropriately with staff and peers during his admission.  Slept without difficulty.  Spoke with receptionist at Dr. Maggie Schwalbe office (patients' psychiatrist) Reports that they were unaware that patient had been switched to [facility] while in hospital.  Patient was given Gean Birchwood while at [facility] first loading dose of 234 mg IM on 12/20/20, second loading on 12/24/20 156 mg IM.  The next scheduled dose (maintenance dose) of Invega Sustenna 156 mg was due on 01/21/21.  Patient was given Haldol Decanoate 100 mg on 01/19/21 by his primary psychiatrist (Dr. Maggie Schwalbe).  Informed the receptionist at Dr. Theophilus Kinds off would send over patient's prior discharge hospital instruction and he could decide if he wanted to continue with Haldol Deacon ate or if wanted to switch to the [place]. Patient next  appointment with Dr. Maggie Schwalbe is 02/05/21.   Patient has also been referred to [facility] for ACTT services.  Patient's mother is follow up with [facility].  Patient also had referral to [employer] and [employer] for community support.   Stay Summary: Noah Ramsey was admitted to [facility] continuous  assessment unit for Schizophrenia, schizoaffective, chronic with acute exacerbation (HCC) and crisis management.  He was treated with the following medications Vistaril 25 mg, Risperdal, and Propanol during admission.  Patient is to restart home medications once discharged  Will have social work fax Dr. Maggie Schwalbe current list of patient medications.      Medications patient discharged home with after last psychiatric hospitalization 01/05/21 are the following. Cogentin 0.5 mg Q 6 hrs prn tremor Doxepin 10 mg Q hs for sleep Neurontin 300 mg Q hs for agitation Miralax 17 gm daily Risperdal 2 mg Bid for mood control Propranolol 40 mg Q 8 hrs for anxiety Invega Sustenna 156 mg was due on 01/21/21 when follow up with Dr. Maggie Schwalbe but was given Haldol Decanoate 100 mg on 01/19/21 during psychiatric follow up visit.   Medications that were discontinued 01/05/21 Diazepam 5 mg Haloperidol 10 mg Haloperidol Decanoate 100 mg Latuda 40 mg  Current medications tolerated with no adverse reaction.  Noah Ramsey was discharged with current medication and was instructed on how to take medications as prescribed; (details listed below under Medication List).   Improvement of symptoms, emotional and mental status were monitored with continuous assessment/observation by staff and patients report of symptom reduction; and his statement of feeling better.   Edmundo Tedesco was evaluated for stability and plans for continued recovery upon discharge.  Noah Ramsey motivation was an integral factor for scheduling further treatment.  Employment, transportation, bed availability, health status, family support, and any pending legal issues were also considered during his during the 24 hours of continuous assessment/observation.  He was offered further treatment options upon discharge including but not limited to Residential, Outpatient psychiatric services, [facility] (for assistance with food, shelter/housing,  paying bills, social work needs).  Wenceslaus Gist will follow up with the services as listed below under Follow up Information.    Upon completion of this admission the Tymier Lindholm was both mentally and medically stable for discharge denying suicidal/homicidal ideation, auditory/visual/tactile hallucinations, delusional thoughts and paranoia.       After thorough evaluation and review of information currently presented on assessment of Deundra Bard, there is insufficient findings to indicate patient meets criteria for involuntary commitment or require an inpatient level of care. Avaneesh Pepitone is alert/oriented x 4, organized; mood congruent with affect; and denies suicidal/self-harm/homicidal ideation, psychosis, and paranoia.  Patient is at psychiatric baseline at this time he is not significantly impaired, psychotic, or manic on exam.    Total Time spent with patient: 45 minutes  Past Psychiatric History: Schizophrenia, schizoaffective disorder, hallucinations, aggressive behavior Past Medical History:  Past Medical History:  Diagnosis Date   Elevated CPK    per patient   Schizophrenia [facility])    History reviewed. No pertinent surgical history. Family History:  Family History  Problem Relation Age of Onset   Mental illness Brother    Family Psychiatric History: Patient unaware Social History:  Social History   Substance and Sexual Activity  Alcohol Use Never     Social History   Substance and Sexual Activity  Drug Use Never    Social History   Socioeconomic History   Marital status: Single  Spouse name: Not on file   Number of children: Not on file   Years of education: Not on file   Highest education level: Not on file  Occupational History   Not on file  Tobacco Use   Smoking status: Never   Smokeless tobacco: Never  Substance and Sexual Activity   Alcohol use: Never   Drug use: Never   Sexual activity: Not on file  Other Topics Concern    Not on file  Social History Narrative   Not on file   Social Determinants of Health   Financial Resource Strain: Not on file  Food Insecurity: Not on file  Transportation Needs: Not on file  Physical Activity: Not on file  Stress: Not on file  Social Connections: Not on file   SDOH:  SDOH Screenings   Alcohol Screen: Low Risk    Last Alcohol Screening Score (AUDIT): 0  Depression (PHQ2-9): Not on file  Financial Resource Strain: Not on file  Food Insecurity: Not on file  Housing: Not on file  Physical Activity: Not on file  Social Connections: Not on file  Stress: Not on file  Tobacco Use: Low Risk    Smoking Tobacco Use: Never   Smokeless Tobacco Use: Never  Transportation Needs: Not on file    Tobacco Cessation:  N/A, patient does not currently use tobacco products  Current Medications:  Current Facility-Administered Medications  Medication Dose Route Frequency Provider Last Rate Last Admin   acetaminophen (TYLENOL) tablet 650 mg  650 mg Oral Q6H PRN Jaclyn Shaggy, PA-C       alum & mag hydroxide-simeth (MAALOX/MYLANTA) 200-200-20 MG/5ML suspension 30 mL  30 mL Oral Q4H PRN [name] W, PA-C       hydrOXYzine (ATARAX/VISTARIL) tablet 25 mg  25 mg Oral TID PRN Jaclyn Shaggy, PA-C   25 mg at 01/30/21 0747   magnesium hydroxide (MILK OF MAGNESIA) suspension 30 mL  30 mL Oral Daily PRN [name] W, PA-C       metFORMIN (GLUCOPHAGE) tablet 500 mg  500 mg Oral BID WC [name] W, PA-C   500 mg at 01/30/21 9024   Current Outpatient Medications  Medication Sig Dispense Refill   diazepam (VALIUM) 5 MG tablet Take 5 mg by mouth 2 (two) times daily as needed for anxiety.     haloperidol decanoate (HALDOL DECANOATE) 100 MG/ML injection Inject 100 mg into the muscle every 30 (thirty) days.     metFORMIN (GLUCOPHAGE) 500 MG tablet Take 1 tablet (500 mg total) by mouth 2 (two) times daily with a meal. :Medication induced wt management 60 tablet 0    PTA Medications:  (Not in a hospital admission)   Musculoskeletal  Strength & Muscle Tone: within normal limits Gait & Station: normal Patient leans: N/A  Psychiatric Specialty Exam  Presentation  General Appearance: Appropriate for Environment; Casual  Eye Contact:Good  Speech:Clear and Coherent; Normal Rate  Speech Volume:Normal  Handedness:Right   Mood and Affect  Mood:Euthymic  Affect:Congruent   Thought Process  Thought Processes:Coherent  Descriptions of Associations:Circumstantial  Orientation:Full (Time, Place and Person)  Thought Content:WDL  Diagnosis of Schizophrenia or Schizoaffective disorder in past: Yes  Duration of Psychotic Symptoms: Greater than six months   Hallucinations:Hallucinations: None  Ideas of Reference:None (Chronic history of hallucinations that wax and wane)  Suicidal Thoughts:Suicidal Thoughts: No  Homicidal Thoughts:Homicidal Thoughts: No   Sensorium  Memory:Immediate Fair; Recent Fair  Judgment:Fair  Insight:Fair   Executive Functions  Concentration:Fair  Attention Span:Fair  Recall:Fair  Fund of Knowledge:Good  Language:Good   Psychomotor Activity  Psychomotor Activity:Psychomotor Activity: Normal   Assets  Assets:Communication Skills; Desire for Improvement; Financial Resources/Insurance; Housing; Leisure Time; Social Support   Sleep  Sleep:Sleep: Fair Number of Hours of Sleep: 2   Nutritional Assessment (For OBS and FBC admissions only) Has the patient had a weight loss or gain of 10 pounds or more in the last 3 months?: No Has the patient had a decrease in food intake/or appetite?: No Does the patient have dental problems?: No Does the patient have eating habits or behaviors that may be indicators of an eating disorder including binging or inducing vomiting?: No Has the patient recently lost weight without trying?: 0 Has the patient been eating poorly because of a decreased appetite?: 0 Malnutrition Screening  Tool Score: 0 Nutritional Assessment Referrals: Refer to Primary Care Provider    Physical Exam  Physical Exam Vitals and nursing note reviewed. Exam conducted with a chaperone present.  Constitutional:      General: He is not in acute distress.    Appearance: Normal appearance. He is not ill-appearing.  Cardiovascular:     Rate and Rhythm: Normal rate.  Pulmonary:     Effort: Pulmonary effort is normal.  Musculoskeletal:        General: Normal range of motion.     Cervical back: Normal range of motion.  Skin:    General: Skin is warm and dry.  Neurological:     Mental Status: He is alert and oriented to person, place, and time.  Psychiatric:        Attention and Perception: Attention and perception normal. He does not perceive auditory or visual hallucinations.        Mood and Affect: Mood and affect normal.        Speech: Speech normal.        Behavior: Behavior normal. Behavior is cooperative.        Thought Content: Thought content is not paranoid. Delusional: Baseline.Thought content does not include homicidal or suicidal ideation.        Cognition and Memory: Cognition and memory normal.        Judgment: Judgment is impulsive.   Review of Systems  Constitutional: Negative.        Patient reporting chronic history of rhabdomyolysis but no complaints of muscle pain  HENT: Negative.    Eyes: Negative.   Respiratory: Negative.    Cardiovascular: Negative.   Gastrointestinal: Negative.   Genitourinary: Negative.   Musculoskeletal: Negative.   Skin: Negative.   Neurological: Negative.   Endo/Heme/Allergies: Negative.   Psychiatric/Behavioral:  Depression: Stable. Hallucinations: Denies. Substance abuse: Denies. Suicidal ideas: Denies. Nervous/anxious: Denies. Insomnia: Denies.   Blood pressure 130/73, pulse (!) 114, temperature 98.1 F (36.7 C), temperature source Oral, resp. rate 16, SpO2 98 %. There is no height or weight on file to calculate BMI.  Demographic  Factors:  Male, Unemployed, and Black  Loss Factors: NA  Historical Factors: Family history of mental illness or substance abuse and Impulsivity  Risk Reduction Factors:   Sense of responsibility to family, Religious beliefs about death, Living with another person, especially a relative, and Positive social support  Continued Clinical Symptoms:  Schizophrenia:   Less than 58 years old Paranoid or undifferentiated type  Cognitive Features That Contribute To Risk:  Thought constriction (tunnel vision)    Suicide Risk:  Minimal: No identifiable suicidal ideation.  Patients presenting with no risk factors but with  morbid ruminations; may be classified as minimal risk based on the severity of the depressive symptoms  Plan Of Care/Follow-up recommendations:  Activity:  Activity as tolerated  Disposition: Psychiatrically cleared.  IVC rescinded   Follow-up Information     [facility] On 02/05/2021.   Why: Keep scheduled appointment 02/05/21 at 11 AM.  Please give copy of  discharged papers for doctor to go over Contact information: [address] [place] [address] [phone]         [facility]   Why: Call to check on ACTT services referral Contact information: [address] [place] [address] [phone]                  Assunta Found, NP 01/30/2021, 12:36 PM

## 2021-01-30 NOTE — ED Notes (Signed)
Pt sleeping@this time. Breathign even and unlabored. Will continue to monitor for safety 

## 2021-02-01 ENCOUNTER — Ambulatory Visit (HOSPITAL_COMMUNITY)
Admission: EM | Admit: 2021-02-01 | Discharge: 2021-02-02 | Disposition: A | Discharge status: Other Acute Inpatient Hospital | Payer: 59 | Attending: Behavioral Health | Admitting: Behavioral Health

## 2021-02-01 ENCOUNTER — Other Ambulatory Visit: Payer: Self-pay

## 2021-02-01 DIAGNOSIS — Z20822 Contact with and (suspected) exposure to covid-19: Secondary | ICD-10-CM | POA: Insufficient documentation

## 2021-02-01 DIAGNOSIS — F209 Schizophrenia, unspecified: Secondary | ICD-10-CM

## 2021-02-01 DIAGNOSIS — F259 Schizoaffective disorder, unspecified: Secondary | ICD-10-CM | POA: Diagnosis not present

## 2021-02-01 DIAGNOSIS — F419 Anxiety disorder, unspecified: Secondary | ICD-10-CM | POA: Insufficient documentation

## 2021-02-01 DIAGNOSIS — Z79899 Other long term (current) drug therapy: Secondary | ICD-10-CM | POA: Diagnosis not present

## 2021-02-01 LAB — CBC WITH DIFFERENTIAL/PLATELET
Abs Immature Granulocytes: 0.01 10*3/uL (ref 0.00–0.07)
Basophils Absolute: 0 10*3/uL (ref 0.0–0.1)
Basophils Relative: 0 %
Eosinophils Absolute: 0.2 10*3/uL (ref 0.0–0.5)
Eosinophils Relative: 4 %
HCT: 42.9 % (ref 39.0–52.0)
Hemoglobin: 15 g/dL (ref 13.0–17.0)
Immature Granulocytes: 0 %
Lymphocytes Relative: 45 %
Lymphs Abs: 2.6 10*3/uL (ref 0.7–4.0)
MCH: 31.4 pg (ref 26.0–34.0)
MCHC: 35 g/dL (ref 30.0–36.0)
MCV: 89.7 fL (ref 80.0–100.0)
Monocytes Absolute: 0.3 10*3/uL (ref 0.1–1.0)
Monocytes Relative: 6 %
Neutro Abs: 2.7 10*3/uL (ref 1.7–7.7)
Neutrophils Relative %: 45 %
Platelets: 258 10*3/uL (ref 150–400)
RBC: 4.78 MIL/uL (ref 4.22–5.81)
RDW: 13.2 % (ref 11.5–15.5)
WBC: 5.9 10*3/uL (ref 4.0–10.5)
nRBC: 0 % (ref 0.0–0.2)

## 2021-02-01 LAB — COMPREHENSIVE METABOLIC PANEL
ALT: 35 U/L (ref 0–44)
AST: 57 U/L — ABNORMAL HIGH (ref 15–41)
Albumin: 3.9 g/dL (ref 3.5–5.0)
Alkaline Phosphatase: 64 U/L (ref 38–126)
Anion gap: 11 (ref 5–15)
BUN: 16 mg/dL (ref 6–20)
CO2: 23 mmol/L (ref 22–32)
Calcium: 9.9 mg/dL (ref 8.9–10.3)
Chloride: 103 mmol/L (ref 98–111)
Creatinine, Ser: 0.96 mg/dL (ref 0.61–1.24)
GFR, Estimated: >60 mL/min (ref >60)
Glucose, Bld: 92 mg/dL (ref 70–99)
Potassium: 4 mmol/L (ref 3.5–5.1)
Sodium: 137 mmol/L (ref 135–145)
Total Bilirubin: 0.9 mg/dL (ref 0.3–1.2)
Total Protein: 7.7 g/dL (ref 6.5–8.1)

## 2021-02-01 LAB — ETHANOL: Alcohol, Ethyl (B): <10 mg/dL (ref <10)

## 2021-02-01 LAB — RESP PANEL BY RT-PCR (FLU A&B, COVID) ARPGX2
Influenza A by PCR: NEGATIVE
Influenza B by PCR: NEGATIVE
SARS Coronavirus 2 by RT PCR: NEGATIVE

## 2021-02-01 LAB — POC SARS CORONAVIRUS 2 AG: SARSCOV2ONAVIRUS 2 AG: NEGATIVE

## 2021-02-01 LAB — POC SARS CORONAVIRUS 2 AG -  ED: SARS Coronavirus 2 Ag: NEGATIVE

## 2021-02-01 MED ORDER — GABAPENTIN 300 MG PO CAPS
300.0000 mg | ORAL_CAPSULE | Freq: Every day | ORAL | Status: DC
  Administered 2021-02-01: 300 mg via ORAL
  Filled 2021-02-01: qty 1

## 2021-02-01 MED ORDER — DIPHENHYDRAMINE HCL 50 MG PO CAPS: 50.0000 mg | ORAL_CAPSULE | Freq: Once | ORAL | Status: AC | PRN

## 2021-02-01 MED ORDER — TEMAZEPAM 15 MG PO CAPS
30.0000 mg | ORAL_CAPSULE | Freq: Once | ORAL | Status: AC
  Administered 2021-02-01: 30 mg via ORAL
  Filled 2021-02-01: qty 2

## 2021-02-01 MED ORDER — HALOPERIDOL LACTATE 5 MG/ML IJ SOLN
10.0000 mg | Freq: Once | INTRAMUSCULAR | Status: AC | PRN
  Administered 2021-02-02: 10 mg via INTRAMUSCULAR
  Filled 2021-02-01: qty 2

## 2021-02-01 MED ORDER — DOXEPIN HCL 10 MG PO CAPS: 10.0000 mg | ORAL_CAPSULE | Freq: Every day | ORAL | Status: DC

## 2021-02-01 MED ORDER — HALOPERIDOL 5 MG PO TABS: 10.0000 mg | ORAL_TABLET | Freq: Once | ORAL | Status: AC | PRN

## 2021-02-01 MED ORDER — ALUM & MAG HYDROXIDE-SIMETH 200-200-20 MG/5ML PO SUSP
30.0000 mL | ORAL | Status: DC | PRN
Start: 2021-02-01 — End: 2021-02-02

## 2021-02-01 MED ORDER — ACETAMINOPHEN 325 MG PO TABS
650.0000 mg | ORAL_TABLET | Freq: Four times a day (QID) | ORAL | Status: DC | PRN
Start: 2021-02-01 — End: 2021-02-02

## 2021-02-01 MED ORDER — PROPRANOLOL HCL 20 MG PO TABS: 40.0000 mg | ORAL_TABLET | Freq: Three times a day (TID) | ORAL | Status: DC | PRN

## 2021-02-01 MED ORDER — DIPHENHYDRAMINE HCL 50 MG/ML IJ SOLN
50.0000 mg | Freq: Once | INTRAMUSCULAR | Status: AC | PRN
  Administered 2021-02-02: 50 mg via INTRAMUSCULAR
  Filled 2021-02-01: qty 1

## 2021-02-01 MED ORDER — HYDROXYZINE HCL 25 MG PO TABS
25.0000 mg | ORAL_TABLET | Freq: Four times a day (QID) | ORAL | Status: DC | PRN
  Administered 2021-02-01: 25 mg via ORAL
  Filled 2021-02-01: qty 1

## 2021-02-01 MED ORDER — MAGNESIUM HYDROXIDE 400 MG/5ML PO SUSP: 30.0000 mL | Freq: Every day | ORAL | Status: DC | PRN

## 2021-02-01 MED ORDER — METFORMIN HCL 500 MG PO TABS
500.0000 mg | ORAL_TABLET | Freq: Two times a day (BID) | ORAL | Status: DC
  Administered 2021-02-02: 500 mg via ORAL
  Filled 2021-02-01: qty 1

## 2021-02-01 NOTE — ED Provider Notes (Signed)
Behavioral Health Admission H&P [facility] & [facility]BS)  Date: 02/01/21 Patient Name: Noah Ramsey MRN: [id number] Chief Complaint: Patient brought in under IVC by [employer].     Diagnoses:  Final diagnoses:  Schizophrenia, unspecified type (HCC)    Per Cranston Neighbor, NP initial assessment: History of Present illness: Noah Ramsey is a 25 y.o. male. Patient presented to [facility] as a walk in with complaints of "I needed to clear my head".  Noah Ramsey, 25 y.o., male patient seen face to face by this provider, consulted with Dr. Lucianne Muss; and chart reviewed on 02/01/21.  On evaluation Noah Ramsey reports "I need resourcees in [place]". Patient reports that his Ramsey is going to drive him to [place] to live. Writer called and spoke with patient's Ramsey and she informed that she had no plans to take him to [place]. Noah Ramsey stated that "it's a delusion".  During evaluation Milbern Doescher is standing and pacing in no acute distress.  He is alert/oriented; cooperative; and mood congruent with affect.  He is speaking in a clear tone at moderate volume, and normal pace; with good eye contact.  He is ruminating on driving down to [place]; There is no indication that he is currently responding to internal/external stimuli or experiencing delusional thought content; and he has denied suicidal/self-harm/homicidal ideation, psychosis, and paranoia.   Patient has remained calm throughout assessment and has answered questions appropriately.     Psychiatric Specialty Exam   Presentation  General Appearance:Bizarre; Fairly Groomed   Eye Contact:Good   Speech:Clear and Coherent   Speech Volume:Normal   Handedness:Right     Mood and Affect  Mood:Anxious   Affect:Flat     Thought Process  Thought Processes:Disorganized   Descriptions of Associations:Loose   Orientation:Full (Time, Place and Person)   Thought Content:Rumination  Diagnosis of Schizophrenia or Schizoaffective  disorder in past: Yes  Duration of Psychotic Symptoms: Greater than six months  Hallucinations:None denies today None mentioned today   Ideas of Reference:None   Suicidal Thoughts:No   Homicidal Thoughts:No Without Intent; Without Plan; Without Means to Carry Out; Without Access to Means     Sensorium  Memory:Immediate Poor   Judgment:Fair   Insight:Poor     Executive Functions  Concentration:Good   Attention Span:Good   Recall:Good   Fund of Knowledge:Good   Language:Good     Psychomotor Activity  Psychomotor Activity:Normal     Assets  Assets:Communication Skills; Housing     Sleep  Sleep:Good   Number of hours: 2     No data recorded   Physical Exam: Physical Exam Constitutional:      Appearance: Normal appearance.  HENT:     Nose: Nose normal.  Eyes:     General:        Right eye: No discharge.        Left eye: No discharge.  Musculoskeletal:     Cervical back: Normal range of motion.  Neurological:     General: No focal deficit present.     Mental Status: He is alert.  Psychiatric:        Attention and Perception: Attention normal.        Mood and Affect: Mood normal.        Speech: Speech normal.        Behavior: Behavior is hyperactive. Behavior is cooperative.        Thought Content: Thought content is not paranoid. Thought content does not include homicidal or suicidal ideation.    Review of Systems  Psychiatric/Behavioral:  Negative for substance abuse and suicidal ideas. The patient is nervous/anxious.   All other systems reviewed and are negative. Pulse 92, temperature 97.6 F (36.4 C), temperature source Oral, resp. rate 16, SpO2 100 %. There is no height or weight on file to calculate BMI.   Musculoskeletal: Strength & Muscle Tone: within normal limits Gait & Station: normal Patient leans: N/A      PHQ 2-9:   Flowsheet Row ED from 01/30/2021 in [facility] ED from 01/26/2021 in [facility] EMERGENCY DEPARTMENT ED from 01/22/2021 in [facility] EMERGENCY DEPARTMENT  C-SSRS RISK CATEGORY Low Risk No Risk No Risk        Total Time spent with patient: 15 minutes  Musculoskeletal  Strength & Muscle Tone: within normal limits Gait & Station: normal Patient leans: N/A  Psychiatric Specialty Exam  Presentation General Appearance: Bizarre; Fairly Groomed  Eye Contact:Good  Speech:Clear and Coherent  Speech Volume:Normal  Handedness:Right   Mood and Affect  Mood:Anxious  Affect:Flat   Thought Process  Thought Processes:Disorganized  Descriptions of Associations:Loose  Orientation:Full (Time, Place and Person)  Thought Content:Rumination  Diagnosis of Schizophrenia or Schizoaffective disorder in past: Yes  Duration of Psychotic Symptoms: Greater than six months  Hallucinations:Hallucinations: None  Ideas of Reference:None  Suicidal Thoughts:Suicidal Thoughts: No  Homicidal Thoughts:Homicidal Thoughts: No   Sensorium  Memory:Immediate Poor  Judgment:Fair  Insight:Poor   Executive Functions  Concentration:Good  Attention Span:Good  Recall:Good  Fund of Knowledge:Good  Language:Good   Psychomotor Activity  Psychomotor Activity:No data recorded  Assets  Assets:Communication Skills; Housing   Sleep  Sleep:Sleep: Good     Pulse 92, temperature 97.6 F (36.4 C), temperature source Oral, resp. rate 16, SpO2 100 %. There is no height or weight on file to calculate BMI.  Past Psychiatric History: Hx of Schizophrenia   Past Medical History:  Past Medical History:  Diagnosis Date   Elevated CPK    per patient   Schizophrenia (HCC)    No past surgical history on file.  Family History:  Family History  Problem Relation Age of Onset   Mental illness Brother     Social History:  Social History   Socioeconomic History   Marital status: Single    Spouse name: Not on file   Number of  children: Not on file   Years of education: Not on file   Highest education level: Not on file  Occupational History   Not on file  Tobacco Use   Smoking status: Never   Smokeless tobacco: Never  Substance and Sexual Activity   Alcohol use: Never   Drug use: Never   Sexual activity: Not on file  Other Topics Concern   Not on file  Social History Narrative   Not on file   Social Determinants of Health   Financial Resource Strain: Not on file  Food Insecurity: Not on file  Transportation Needs: Not on file  Physical Activity: Not on file  Stress: Not on file  Social Connections: Not on file  Intimate Partner Violence: Not on file    SDOH:  SDOH Screenings   Alcohol Screen: Low Risk    Last Alcohol Screening Score (AUDIT): 0  Depression (PHQ2-9): Not on file  Financial Resource Strain: Not on file  Food Insecurity: Not on file  Housing: Not on file  Physical Activity: Not on file  Social Connections: Not on file  Stress: Not on file  Tobacco  Use: Low Risk    Smoking Tobacco Use: Never   Smokeless Tobacco Use: Never  Transportation Needs: Not on file    Last Labs:  Admission on 01/30/2021, Discharged on 01/30/2021  Component Date Value Ref Range Status   SARS Coronavirus 2 by RT PCR 01/30/2021 NEGATIVE  NEGATIVE Final   Comment: (NOTE) SARS-CoV-2 target nucleic acids are NOT DETECTED.  The SARS-CoV-2 RNA is generally detectable in upper respiratory specimens during the acute phase of infection. The lowest concentration of SARS-CoV-2 viral copies this assay can detect is 138 copies/mL. A negative result does not preclude SARS-Cov-2 infection and should not be used as the sole basis for treatment or other patient management decisions. A negative result may occur with  improper specimen collection/handling, submission of specimen other than nasopharyngeal swab, presence of viral mutation(s) within the areas targeted by this assay, and inadequate number of  viral copies(<138 copies/mL). A negative result must be combined with clinical observations, patient history, and epidemiological information. The expected result is Negative.  Fact Sheet for Patients:  BloggerCourse.com  Fact Sheet for Healthcare Providers:  SeriousBroker.it  This test is no                          t yet approved or cleared by the [place] FDA and  has been authorized for detection and/or diagnosis of SARS-CoV-2 by FDA under an Emergency Use Authorization (EUA). This EUA will remain  in effect (meaning this test can be used) for the duration of the COVID-19 declaration under Section 564(b)(1) of the Act, 21 U.S.C.section 360bbb-3(b)(1), unless the authorization is terminated  or revoked sooner.       Influenza A by PCR 01/30/2021 NEGATIVE  NEGATIVE Final   Influenza B by PCR 01/30/2021 NEGATIVE  NEGATIVE Final   Comment: (NOTE) The Xpert Xpress SARS-CoV-2/FLU/RSV plus assay is intended as an aid in the diagnosis of influenza from Nasopharyngeal swab specimens and should not be used as a sole basis for treatment. Nasal washings and aspirates are unacceptable for Xpert Xpress SARS-CoV-2/FLU/RSV testing.  Fact Sheet for Patients: BloggerCourse.com  Fact Sheet for Healthcare Providers: SeriousBroker.it  This test is not yet approved or cleared by the [place] FDA and has been authorized for detection and/or diagnosis of SARS-CoV-2 by FDA under an Emergency Use Authorization (EUA). This EUA will remain in effect (meaning this test can be used) for the duration of the COVID-19 declaration under Section 564(b)(1) of the Act, 21 U.S.C. section 360bbb-3(b)(1), unless the authorization is terminated or revoked.  Performed at [facility], [address], [place], [place] [address]    POC Amphetamine UR 01/30/2021 None Detected  NONE DETECTED (Cut  Off Level 1000 ng/mL) Preliminary   POC Secobarbital (BAR) 01/30/2021 None Detected  NONE DETECTED (Cut Off Level 300 ng/mL) Preliminary   POC Buprenorphine (BUP) 01/30/2021 Positive (A) NONE DETECTED (Cut Off Level 10 ng/mL) Preliminary   POC Oxazepam (BZO) 01/30/2021 None Detected  NONE DETECTED (Cut Off Level 300 ng/mL) Preliminary   POC Cocaine UR 01/30/2021 None Detected  NONE DETECTED (Cut Off Level 300 ng/mL) Preliminary   POC Methamphetamine UR 01/30/2021 None Detected  NONE DETECTED (Cut Off Level 1000 ng/mL) Preliminary   POC Morphine 01/30/2021 None Detected  NONE DETECTED (Cut Off Level 300 ng/mL) Preliminary   POC Oxycodone UR 01/30/2021 None Detected  NONE DETECTED (Cut Off Level 100 ng/mL) Preliminary   POC Methadone UR 01/30/2021 None Detected  NONE  DETECTED (Cut Off Level 300 ng/mL) Preliminary   POC Marijuana UR 01/30/2021 None Detected  NONE DETECTED (Cut Off Level 50 ng/mL) Preliminary  Admission on 01/26/2021, Discharged on 01/29/2021  Component Date Value Ref Range Status   Sodium 01/26/2021 138  135 - 145 mmol/L Final   Potassium 01/26/2021 3.9  3.5 - 5.1 mmol/L Final   Chloride 01/26/2021 105  98 - 111 mmol/L Final   CO2 01/26/2021 24  22 - 32 mmol/L Final   Glucose, Bld 01/26/2021 104 (A) 70 - 99 mg/dL Final   Glucose reference range applies only to samples taken after fasting for at least 8 hours.   BUN 01/26/2021 11  6 - 20 mg/dL Final   Creatinine, Ser 01/26/2021 0.94  0.61 - 1.24 mg/dL Final   Calcium 16/01/9603 9.2  8.9 - 10.3 mg/dL Final   Total Protein 54/12/8117 7.6  6.5 - 8.1 g/dL Final   Albumin 14/78/2956 3.9  3.5 - 5.0 g/dL Final   AST 21/30/8657 53 (A) 15 - 41 U/L Final   ALT 01/26/2021 35  0 - 44 U/L Final   Alkaline Phosphatase 01/26/2021 63  38 - 126 U/L Final   Total Bilirubin 01/26/2021 0.7  0.3 - 1.2 mg/dL Final   GFR, Estimated 01/26/2021 >60  >60 mL/min Final   Comment: (NOTE) Calculated using the CKD-EPI Creatinine Equation (2021)    Anion  gap 01/26/2021 9  5 - 15 Final   Performed at [facility], [address], [place], [place] [address]   Alcohol, Ethyl (B) 01/26/2021 <10  <10 mg/dL Final   Comment: (NOTE) Lowest detectable limit for serum alcohol is 10 mg/dL.  For medical purposes only. Performed at [facility], [address], [place], [place] [address]    WBC 01/26/2021 5.9  4.0 - 10.5 K/uL Final   RBC 01/26/2021 4.45  4.22 - 5.81 MIL/uL Final   Hemoglobin 01/26/2021 13.9  13.0 - 17.0 g/dL Final   HCT 41/32/4401 41.2  39.0 - 52.0 % Final   MCV 01/26/2021 92.6  80.0 - 100.0 fL Final   MCH 01/26/2021 31.2  26.0 - 34.0 pg Final   MCHC 01/26/2021 33.7  30.0 - 36.0 g/dL Final   RDW 02/72/5366 14.0  11.5 - 15.5 % Final   Platelets 01/26/2021 202  150 - 400 K/uL Final   nRBC 01/26/2021 0.0  0.0 - 0.2 % Final   Neutrophils Relative % 01/26/2021 69  % Final   Neutro Abs 01/26/2021 4.1  1.7 - 7.7 K/uL Final   Lymphocytes Relative 01/26/2021 20  % Final   Lymphs Abs 01/26/2021 1.2  0.7 - 4.0 K/uL Final   Monocytes Relative 01/26/2021 9  % Final   Monocytes Absolute 01/26/2021 0.5  0.1 - 1.0 K/uL Final   Eosinophils Relative 01/26/2021 2  % Final   Eosinophils Absolute 01/26/2021 0.1  0.0 - 0.5 K/uL Final   Basophils Relative 01/26/2021 0  % Final   Basophils Absolute 01/26/2021 0.0  0.0 - 0.1 K/uL Final   Immature Granulocytes 01/26/2021 0  % Final   Abs Immature Granulocytes 01/26/2021 0.02  0.00 - 0.07 K/uL Final   Performed at [facility], [address] [place], [place] [address]   Opiates 01/26/2021 NONE DETECTED  NONE DETECTED Final   Cocaine 01/26/2021 NONE DETECTED  NONE DETECTED Final   Benzodiazepines 01/26/2021 POSITIVE (A) NONE DETECTED Final   Amphetamines 01/26/2021 NONE DETECTED  NONE DETECTED Final  Tetrahydrocannabinol 01/26/2021 NONE DETECTED  NONE DETECTED Final   Barbiturates 01/26/2021 NONE DETECTED  NONE DETECTED Final   Comment: (NOTE) DRUG SCREEN FOR MEDICAL  PURPOSES ONLY.  IF CONFIRMATION IS NEEDED FOR ANY PURPOSE, NOTIFY LAB WITHIN 5 DAYS.  LOWEST DETECTABLE LIMITS FOR URINE DRUG SCREEN Drug Class                     Cutoff (ng/mL) Amphetamine and metabolites    1000 Barbiturate and metabolites    200 Benzodiazepine                 200 Tricyclics and metabolites     300 Opiates and metabolites        300 Cocaine and metabolites        300 THC                            50 Performed at [facility], [address] [place], [place] [address]    SARS Coronavirus 2 by RT PCR 01/27/2021 NEGATIVE  NEGATIVE Final   Comment: (NOTE) SARS-CoV-2 target nucleic acids are NOT DETECTED.  The SARS-CoV-2 RNA is generally detectable in upper respiratory specimens during the acute phase of infection. The lowest concentration of SARS-CoV-2 viral copies this assay can detect is 138 copies/mL. A negative result does not preclude SARS-Cov-2 infection and should not be used as the sole basis for treatment or other patient management decisions. A negative result may occur with  improper specimen collection/handling, submission of specimen other than nasopharyngeal swab, presence of viral mutation(s) within the areas targeted by this assay, and inadequate number of viral copies(<138 copies/mL). A negative result must be combined with clinical observations, patient history, and epidemiological information. The expected result is Negative.  Fact Sheet for Patients:  BloggerCourse.com  Fact Sheet for Healthcare Providers:  SeriousBroker.it  This test is no                          t yet approved or cleared by the [place] FDA and  has been authorized for detection and/or diagnosis of SARS-CoV-2 by FDA under an Emergency Use Authorization (EUA). This EUA will remain  in effect (meaning this test can be used) for the duration of the COVID-19 declaration under Section 564(b)(1) of the Act,  21 U.S.C.section 360bbb-3(b)(1), unless the authorization is terminated  or revoked sooner.       Influenza A by PCR 01/27/2021 NEGATIVE  NEGATIVE Final   Influenza B by PCR 01/27/2021 NEGATIVE  NEGATIVE Final   Comment: (NOTE) The Xpert Xpress SARS-CoV-2/FLU/RSV plus assay is intended as an aid in the diagnosis of influenza from Nasopharyngeal swab specimens and should not be used as a sole basis for treatment. Nasal washings and aspirates are unacceptable for Xpert Xpress SARS-CoV-2/FLU/RSV testing.  Fact Sheet for Patients: BloggerCourse.com  Fact Sheet for Healthcare Providers: SeriousBroker.it  This test is not yet approved or cleared by the [place] FDA and has been authorized for detection and/or diagnosis of SARS-CoV-2 by FDA under an Emergency Use Authorization (EUA). This EUA will remain in effect (meaning this test can be used) for the duration of the COVID-19 declaration under Section 564(b)(1) of the Act, 21 U.S.C. section 360bbb-3(b)(1), unless the authorization is terminated or revoked.  Performed at [facility], [address], [place], [place] [address]    Color, Urine 01/27/2021 STRAW (A)  YELLOW Final   APPearance 01/27/2021 CLEAR  CLEAR Final   Specific Gravity, Urine 01/27/2021 1.008  1.005 - 1.030 Final   pH 01/27/2021 5.0  5.0 - 8.0 Final   Glucose, UA 01/27/2021 NEGATIVE  NEGATIVE mg/dL Final   Hgb urine dipstick 01/27/2021 MODERATE (A) NEGATIVE Final   Bilirubin Urine 01/27/2021 NEGATIVE  NEGATIVE Final   Ketones, ur 01/27/2021 NEGATIVE  NEGATIVE mg/dL Final   Protein, ur 16/01/9603 NEGATIVE  NEGATIVE mg/dL Final   Nitrite 54/12/8117 NEGATIVE  NEGATIVE Final   Leukocytes,Ua 01/27/2021 NEGATIVE  NEGATIVE Final   RBC / HPF 01/27/2021 0-5  0 - 5 RBC/hpf Final   WBC, UA 01/27/2021 0-5  0 - 5 WBC/hpf Final   Bacteria, UA 01/27/2021 NONE SEEN  NONE SEEN Final   Performed at [facility], [address], [place], [place] [address]   Glucose-Capillary 01/27/2021 158 (A) 70 - 99 mg/dL Final   Glucose reference range applies only to samples taken after fasting for at least 8 hours.   Glucose-Capillary 01/29/2021 153 (A) 70 - 99 mg/dL Final   Glucose reference range applies only to samples taken after fasting for at least 8 hours.  Admission on 01/22/2021, Discharged on 01/23/2021  Component Date Value Ref Range Status   Sodium 01/22/2021 136  135 - 145 mmol/L Final   Potassium 01/22/2021 3.6  3.5 - 5.1 mmol/L Final   Chloride 01/22/2021 103  98 - 111 mmol/L Final   CO2 01/22/2021 23  22 - 32 mmol/L Final   Glucose, Bld 01/22/2021 94  70 - 99 mg/dL Final   Glucose reference range applies only to samples taken after fasting for at least 8 hours.   BUN 01/22/2021 16  6 - 20 mg/dL Final   Creatinine, Ser 01/22/2021 0.95  0.61 - 1.24 mg/dL Final   Calcium 95/62/1308 9.8  8.9 - 10.3 mg/dL Final   Total Protein 65/78/4696 7.7  6.5 - 8.1 g/dL Final   Albumin 29/52/8413 3.9  3.5 - 5.0 g/dL Final   AST 24/40/1027 52 (A) 15 - 41 U/L Final   ALT 01/22/2021 42  0 - 44 U/L Final   Alkaline Phosphatase 01/22/2021 59  38 - 126 U/L Final   Total Bilirubin 01/22/2021 0.9  0.3 - 1.2 mg/dL Final   GFR, Estimated 01/22/2021 >60  >60 mL/min Final   Comment: (NOTE) Calculated using the CKD-EPI Creatinine Equation (2021)    Anion gap 01/22/2021 10  5 - 15 Final   Performed at [facility], [address] [place], [place] [address]   Alcohol, Ethyl (B) 01/22/2021 <10  <10 mg/dL Final   Comment: (NOTE) Lowest detectable limit for serum alcohol is 10 mg/dL.  For medical purposes only. Performed at [facility], [address] [place], [place] [address]    WBC 01/22/2021 6.2  4.0 - 10.5 K/uL Final   RBC 01/22/2021 4.61  4.22 - 5.81 MIL/uL Final   Hemoglobin 01/22/2021 14.6  13.0 - 17.0 g/dL Final   HCT 74/25/9563 42.7  39.0 - 52.0 % Final   MCV 01/22/2021 92.6  80.0 - 100.0 fL  Final   MCH 01/22/2021 31.7  26.0 - 34.0 pg Final   MCHC 01/22/2021 34.2  30.0 - 36.0 g/dL Final   RDW 87/56/4332 14.4  11.5 - 15.5 % Final   Platelets 01/22/2021 223  150 - 400 K/uL Final   nRBC 01/22/2021 0.0  0.0 - 0.2 % Final   Performed  at [facility], [address] [place], [place] [address]   Opiates 01/22/2021 NONE DETECTED  NONE DETECTED Final   Cocaine 01/22/2021 NONE DETECTED  NONE DETECTED Final   Benzodiazepines 01/22/2021 POSITIVE (A) NONE DETECTED Final   Amphetamines 01/22/2021 NONE DETECTED  NONE DETECTED Final   Tetrahydrocannabinol 01/22/2021 NONE DETECTED  NONE DETECTED Final   Barbiturates 01/22/2021 NONE DETECTED  NONE DETECTED Final   Comment: (NOTE) DRUG SCREEN FOR MEDICAL PURPOSES ONLY.  IF CONFIRMATION IS NEEDED FOR ANY PURPOSE, NOTIFY LAB WITHIN 5 DAYS.  LOWEST DETECTABLE LIMITS FOR URINE DRUG SCREEN Drug Class                     Cutoff (ng/mL) Amphetamine and metabolites    1000 Barbiturate and metabolites    200 Benzodiazepine                 200 Tricyclics and metabolites     300 Opiates and metabolites        300 Cocaine and metabolites        300 THC                            50 Performed at [facility], [address], [place], [place] [address]   Admission on 01/21/2021, Discharged on 01/22/2021  Component Date Value Ref Range Status   Sodium 01/21/2021 140  135 - 145 mmol/L Final   Potassium 01/21/2021 4.0  3.5 - 5.1 mmol/L Final   Chloride 01/21/2021 107  98 - 111 mmol/L Final   CO2 01/21/2021 26  22 - 32 mmol/L Final   Glucose, Bld 01/21/2021 80  70 - 99 mg/dL Final   Glucose reference range applies only to samples taken after fasting for at least 8 hours.   BUN 01/21/2021 15  6 - 20 mg/dL Final   Creatinine, Ser 01/21/2021 0.89  0.61 - 1.24 mg/dL Final   Calcium 09/81/1914 9.6  8.9 - 10.3 mg/dL Final   Total Protein 78/29/5621 8.0  6.5 - 8.1 g/dL Final   Albumin 30/86/5784 4.1  3.5 - 5.0 g/dL Final   AST 69/62/9528  51 (A) 15 - 41 U/L Final   ALT 01/21/2021 43  0 - 44 U/L Final   Alkaline Phosphatase 01/21/2021 61  38 - 126 U/L Final   Total Bilirubin 01/21/2021 0.8  0.3 - 1.2 mg/dL Final   GFR, Estimated 01/21/2021 >60  >60 mL/min Final   Comment: (NOTE) Calculated using the CKD-EPI Creatinine Equation (2021)    Anion gap 01/21/2021 7  5 - 15 Final   Performed at [facility], [address], [place], [place] [address]   Alcohol, Ethyl (B) 01/21/2021 <10  <10 mg/dL Final   Comment: (NOTE) Lowest detectable limit for serum alcohol is 10 mg/dL.  For medical purposes only. Performed at [facility], [address] [place], [place] [address]    WBC 01/21/2021 5.0  4.0 - 10.5 K/uL Final   RBC 01/21/2021 4.24  4.22 - 5.81 MIL/uL Final   Hemoglobin 01/21/2021 13.5  13.0 - 17.0 g/dL Final   HCT 72/53/6644 39.7  39.0 - 52.0 % Final   MCV 01/21/2021 93.6  80.0 - 100.0 fL Final   MCH 01/21/2021 31.8  26.0 - 34.0 pg Final   MCHC 01/21/2021 34.0  30.0 - 36.0 g/dL Final   RDW 03/47/4259 14.7  11.5 - 15.5 %  Final   Platelets 01/21/2021 213  150 - 400 K/uL Final   nRBC 01/21/2021 0.0  0.0 - 0.2 % Final   Neutrophils Relative % 01/21/2021 52  % Final   Neutro Abs 01/21/2021 2.6  1.7 - 7.7 K/uL Final   Lymphocytes Relative 01/21/2021 36  % Final   Lymphs Abs 01/21/2021 1.8  0.7 - 4.0 K/uL Final   Monocytes Relative 01/21/2021 10  % Final   Monocytes Absolute 01/21/2021 0.5  0.1 - 1.0 K/uL Final   Eosinophils Relative 01/21/2021 2  % Final   Eosinophils Absolute 01/21/2021 0.1  0.0 - 0.5 K/uL Final   Basophils Relative 01/21/2021 0  % Final   Basophils Absolute 01/21/2021 0.0  0.0 - 0.1 K/uL Final   Immature Granulocytes 01/21/2021 0  % Final   Abs Immature Granulocytes 01/21/2021 0.01  0.00 - 0.07 K/uL Final   Performed at [facility], [address] [place], [place] [address]   Acetaminophen (Tylenol), Serum 01/21/2021 <10 (A) 10 - 30 ug/mL  Final   Comment: (NOTE) Therapeutic concentrations vary significantly. A range of 10-30 ug/mL  may be an effective concentration for many patients. However, some  are best treated at concentrations outside of this range. Acetaminophen concentrations >150 ug/mL at 4 hours after ingestion  and >50 ug/mL at 12 hours after ingestion are often associated with  toxic reactions.  Performed at [facility], [address], [place], [place] [address]    Salicylate Lvl 01/21/2021 <7.0 (A) 7.0 - 30.0 mg/dL Final   Performed at [facility], [address] [place], [place] [address]   Color, Urine 01/21/2021 COLORLESS (A) YELLOW Final   APPearance 01/21/2021 CLEAR  CLEAR Final   Specific Gravity, Urine 01/21/2021 1.001 (A) 1.005 - 1.030 Final   pH 01/21/2021 7.0  5.0 - 8.0 Final   Glucose, UA 01/21/2021 NEGATIVE  NEGATIVE mg/dL Final   Hgb urine dipstick 01/21/2021 NEGATIVE  NEGATIVE Final   Bilirubin Urine 01/21/2021 NEGATIVE  NEGATIVE Final   Ketones, ur 01/21/2021 NEGATIVE  NEGATIVE mg/dL Final   Protein, ur 09/98/3382 NEGATIVE  NEGATIVE mg/dL Final   Nitrite 50/53/9767 NEGATIVE  NEGATIVE Final   Leukocytes,Ua 01/21/2021 NEGATIVE  NEGATIVE Final   Performed at [facility], [address] [place], [place] [address]   Color, Urine 01/21/2021 STRAW (A) YELLOW Final   APPearance 01/21/2021 CLEAR  CLEAR Final   Specific Gravity, Urine 01/21/2021 1.011  1.005 - 1.030 Final   pH 01/21/2021 7.0  5.0 - 8.0 Final   Glucose, UA 01/21/2021 NEGATIVE  NEGATIVE mg/dL Final   Hgb urine dipstick 01/21/2021 NEGATIVE  NEGATIVE Final   Bilirubin Urine 01/21/2021 NEGATIVE  NEGATIVE Final   Ketones, ur 01/21/2021 NEGATIVE  NEGATIVE mg/dL Final   Protein, ur 79/05/4095 NEGATIVE  NEGATIVE mg/dL Final   Nitrite 35/32/9924 NEGATIVE  NEGATIVE Final   Leukocytes,Ua 01/21/2021 NEGATIVE  NEGATIVE Final   Performed at [facility], [address] [place], [place] [address]   Opiates 01/21/2021 NONE DETECTED  NONE DETECTED Final   Cocaine 01/21/2021 NONE DETECTED  NONE DETECTED Final   Benzodiazepines 01/21/2021 POSITIVE (A) NONE DETECTED Final   Amphetamines 01/21/2021 NONE DETECTED  NONE DETECTED Final   Tetrahydrocannabinol 01/21/2021 NONE DETECTED  NONE DETECTED Final   Barbiturates 01/21/2021 NONE DETECTED  NONE DETECTED Final   Comment: (NOTE) DRUG SCREEN FOR MEDICAL PURPOSES ONLY.  IF CONFIRMATION IS NEEDED FOR ANY PURPOSE, NOTIFY LAB WITHIN  5 DAYS.  LOWEST DETECTABLE LIMITS FOR URINE DRUG SCREEN Drug Class                     Cutoff (ng/mL) Amphetamine and metabolites    1000 Barbiturate and metabolites    200 Benzodiazepine                 200 Tricyclics and metabolites     300 Opiates and metabolites        300 Cocaine and metabolites        300 THC                            50 Performed at [facility], [address], [place], [place] [address]   Admission on 12/03/2020, Discharged on 01/05/2021  Component Date Value Ref Range Status   Hgb A1c MFr Bld 12/04/2020 5.5  4.8 - 5.6 % Final   Comment: (NOTE) Pre diabetes:          5.7%-6.4%  Diabetes:              >6.4%  Glycemic control for   <7.0% adults with diabetes    Mean Plasma Glucose 12/04/2020 111.15  mg/dL Final   Performed at [facility], [address], [place], [place] [address]   Sodium 12/05/2020 141  135 - 145 mmol/L Final   Potassium 12/05/2020 4.0  3.5 - 5.1 mmol/L Final   Chloride 12/05/2020 107  98 - 111 mmol/L Final   CO2 12/05/2020 25  22 - 32 mmol/L Final   Glucose, Bld 12/05/2020 94  70 - 99 mg/dL Final   Glucose reference range applies only to samples taken after fasting for at least 8 hours.   BUN 12/05/2020 11  6 - 20 mg/dL Final   Creatinine, Ser 12/05/2020 0.74  0.61 - 1.24 mg/dL Final   Calcium 84/69/6295 9.2  8.9 - 10.3 mg/dL Final   Total Protein 28/41/3244 7.4  6.5 - 8.1 g/dL Final    Albumin 04/24/7251 3.8  3.5 - 5.0 g/dL Final   AST 66/44/0347 42 (A) 15 - 41 U/L Final   ALT 12/05/2020 58 (A) 0 - 44 U/L Final   Alkaline Phosphatase 12/05/2020 88  38 - 126 U/L Final   Total Bilirubin 12/05/2020 0.5  0.3 - 1.2 mg/dL Final   GFR, Estimated 12/05/2020 >60  >60 mL/min Final   Comment: (NOTE) Calculated using the CKD-EPI Creatinine Equation (2021)    Anion gap 12/05/2020 9  5 - 15 Final   Performed at [facility], [address], [place], [place] [address]   Color, Urine 12/05/2020 YELLOW  YELLOW Final   APPearance 12/05/2020 CLEAR  CLEAR Final   Specific Gravity, Urine 12/05/2020 1.020  1.005 - 1.030 Final   pH 12/05/2020 5.0  5.0 - 8.0 Final   Glucose, UA 12/05/2020 NEGATIVE  NEGATIVE mg/dL Final   Hgb urine dipstick 12/05/2020 NEGATIVE  NEGATIVE Final   Bilirubin Urine 12/05/2020 NEGATIVE  NEGATIVE Final   Ketones, ur 12/05/2020 NEGATIVE  NEGATIVE mg/dL Final   Protein, ur 63/87/5643 NEGATIVE  NEGATIVE mg/dL Final   Nitrite 32/95/1884 NEGATIVE  NEGATIVE Final   Leukocytes,Ua 12/05/2020 NEGATIVE  NEGATIVE Final   RBC / HPF 12/05/2020 0-5  0 - 5 RBC/hpf Final   WBC, UA 12/05/2020 0-5  0 - 5 WBC/hpf Final   Bacteria, UA 12/05/2020 NONE SEEN  NONE SEEN Final  Squamous Epithelial / LPF 12/05/2020 0-5  0 - 5 Final   Performed at [facility], [address], [place], [place] [address]   TSH 12/06/2020 5.767 (A) 0.350 - 4.500 uIU/mL Final   Comment: Performed by a 3rd Generation assay with a functional sensitivity of <=0.01 uIU/mL. Performed at [facility], [address], [place], [place] [address]    Color, Urine 12/11/2020 YELLOW  YELLOW Final   APPearance 12/11/2020 CLEAR  CLEAR Final   Specific Gravity, Urine 12/11/2020 1.018  1.005 - 1.030 Final   pH 12/11/2020 7.0  5.0 - 8.0 Final   Glucose, UA 12/11/2020 NEGATIVE  NEGATIVE mg/dL Final   Hgb urine dipstick 12/11/2020 NEGATIVE  NEGATIVE Final   Bilirubin  Urine 12/11/2020 NEGATIVE  NEGATIVE Final   Ketones, ur 12/11/2020 NEGATIVE  NEGATIVE mg/dL Final   Protein, ur 09/81/1914 NEGATIVE  NEGATIVE mg/dL Final   Nitrite 78/29/5621 NEGATIVE  NEGATIVE Final   Leukocytes,Ua 12/11/2020 NEGATIVE  NEGATIVE Final   RBC / HPF 12/11/2020 0-5  0 - 5 RBC/hpf Final   WBC, UA 12/11/2020 0-5  0 - 5 WBC/hpf Final   Bacteria, UA 12/11/2020 NONE SEEN  NONE SEEN Final   Performed at [facility], [address], [place], [place] [address]   SARSCOV2ONAVIRUS 2 AG 12/22/2020 NEGATIVE  NEGATIVE Final   Comment: (NOTE) SARS-CoV-2 antigen NOT DETECTED.   Negative results are presumptive.  Negative results do not preclude SARS-CoV-2 infection and should not be used as the sole basis for treatment or other patient management decisions, including infection  control decisions, particularly in the presence of clinical signs and  symptoms consistent with COVID-19, or in those who have been in contact with the virus.  Negative results must be combined with clinical observations, patient history, and epidemiological information. The expected result is Negative.  Fact Sheet for Patients: https://www.jennings-kim.com/  Fact Sheet for Healthcare Providers: https://alexander-rogers.biz/  This test is not yet approved or cleared by the [place] FDA and  has been authorized for detection and/or diagnosis of SARS-CoV-2 by FDA under an Emergency Use Authorization (EUA).  This EUA will remain in effect (meaning this test can be used) for the duration of  the COV                          ID-19 declaration under Section 564(b)(1) of the Act, 21 U.S.C. section 360bbb-3(b)(1), unless the authorization is terminated or revoked sooner.     SARS Coronavirus 2 by RT PCR 12/25/2020 NEGATIVE  NEGATIVE Final   Comment: (NOTE) SARS-CoV-2 target nucleic acids are NOT DETECTED.  The SARS-CoV-2 RNA is generally detectable in upper  respiratory specimens during the acute phase of infection. The lowest concentration of SARS-CoV-2 viral copies this assay can detect is 138 copies/mL. A negative result does not preclude SARS-Cov-2 infection and should not be used as the sole basis for treatment or other patient management decisions. A negative result may occur with  improper specimen collection/handling, submission of specimen other than nasopharyngeal swab, presence of viral mutation(s) within the areas targeted by this assay, and inadequate number of viral copies(<138 copies/mL). A negative result must be combined with clinical observations, patient history, and epidemiological information. The expected result is Negative.  Fact Sheet for Patients:  BloggerCourse.com  Fact Sheet for Healthcare Providers:  SeriousBroker.it  This test is no  t yet approved or cleared by the [place] and  has been authorized for detection and/or diagnosis of SARS-CoV-2 by FDA under an Emergency Use Authorization (EUA). This EUA will remain  in effect (meaning this test can be used) for the duration of the COVID-19 declaration under Section 564(b)(1) of the Act, 21 U.S.C.section 360bbb-3(b)(1), unless the authorization is terminated  or revoked sooner.       Influenza A by PCR 12/25/2020 NEGATIVE  NEGATIVE Final   Influenza B by PCR 12/25/2020 NEGATIVE  NEGATIVE Final   Comment: (NOTE) The Xpert Xpress SARS-CoV-2/FLU/RSV plus assay is intended as an aid in the diagnosis of influenza from Nasopharyngeal swab specimens and should not be used as a sole basis for treatment. Nasal washings and aspirates are unacceptable for Xpert Xpress SARS-CoV-2/FLU/RSV testing.  Fact Sheet for Patients: BloggerCourse.com  Fact Sheet for Healthcare Providers: SeriousBroker.it  This test is not yet approved or  cleared by the [place] FDA and has been authorized for detection and/or diagnosis of SARS-CoV-2 by FDA under an Emergency Use Authorization (EUA). This EUA will remain in effect (meaning this test can be used) for the duration of the COVID-19 declaration under Section 564(b)(1) of the Act, 21 U.S.C. section 360bbb-3(b)(1), unless the authorization is terminated or revoked.  Performed at [facility], [address] [place], [place] [address]    Sodium 12/26/2020 133 (A) 135 - 145 mmol/L Final   Potassium 12/26/2020 3.8  3.5 - 5.1 mmol/L Final   Chloride 12/26/2020 99  98 - 111 mmol/L Final   CO2 12/26/2020 22  22 - 32 mmol/L Final   Glucose, Bld 12/26/2020 114 (A) 70 - 99 mg/dL Final   Glucose reference range applies only to samples taken after fasting for at least 8 hours.   BUN 12/26/2020 13  6 - 20 mg/dL Final   Creatinine, Ser 12/26/2020 0.93  0.61 - 1.24 mg/dL Final   Calcium 27/06/5007 8.9  8.9 - 10.3 mg/dL Final   Total Protein 38/18/2993 7.5  6.5 - 8.1 g/dL Final   Albumin 71/69/6789 3.8  3.5 - 5.0 g/dL Final   AST 38/01/1750 40  15 - 41 U/L Final   ALT 12/26/2020 32  0 - 44 U/L Final   Alkaline Phosphatase 12/26/2020 83  38 - 126 U/L Final   Total Bilirubin 12/26/2020 0.6  0.3 - 1.2 mg/dL Final   GFR, Estimated 12/26/2020 >60  >60 mL/min Final   Comment: (NOTE) Calculated using the CKD-EPI Creatinine Equation (2021)    Anion gap 12/26/2020 12  5 - 15 Final   Performed at [facility], [address], [place], [place] [address]   Free T4 12/26/2020 0.77  0.61 - 1.12 ng/dL Final   Comment: (NOTE) Biotin ingestion may interfere with free T4 tests. If the results are inconsistent with the TSH level, previous test results, or the clinical presentation, then consider biotin interference. If needed, order repeat testing after stopping biotin. Performed at [facility], [address], [place], [place] [address]    TSH  12/26/2020 2.679  0.350 - 4.500 uIU/mL Final   Comment: Performed by a 3rd Generation assay with a functional sensitivity of <=0.01 uIU/mL. Performed at [facility], [address], [place], [place] [address]    SARS Coronavirus 2 by RT PCR 12/27/2020 POSITIVE (A) NEGATIVE Final   Comment: RESULT CALLED TO, READ BACK BY AND VERIFIED WITH: Vivia Birmingham. RN ON 12/27/2020 @ [phone]  BY MECIAL J. (NOTE) SARS-CoV-2 target nucleic acids are DETECTED.  The SARS-CoV-2 RNA is generally detectable in upper respiratory specimens during the acute phase of infection. Positive results are indicative of the presence of the identified virus, but do not rule out bacterial infection or co-infection with other pathogens not detected by the test. Clinical correlation with patient history and other diagnostic information is necessary to determine patient infection status. The expected result is Negative.  Fact Sheet for Patients: BloggerCourse.com  Fact Sheet for Healthcare Providers: SeriousBroker.it  This test is not yet approved or cleared by the [place] FDA and  has been authorized for detection and/or diagnosis of SARS-CoV-2 by FDA under an Emergency Use Authorization (EUA).  This EUA will remain in effect (meaning thi                          s test can be used) for the duration of  the COVID-19 declaration under Section 564(b)(1) of the Act, 21 U.S.C. section 360bbb-3(b)(1), unless the authorization is terminated or revoked sooner.     Influenza A by PCR 12/27/2020 NEGATIVE  NEGATIVE Final   Influenza B by PCR 12/27/2020 NEGATIVE  NEGATIVE Final   Comment: (NOTE) The Xpert Xpress SARS-CoV-2/FLU/RSV plus assay is intended as an aid in the diagnosis of influenza from Nasopharyngeal swab specimens and should not be used as a sole basis for treatment. Nasal washings and aspirates are unacceptable for Xpert Xpress  SARS-CoV-2/FLU/RSV testing.  Fact Sheet for Patients: BloggerCourse.com  Fact Sheet for Healthcare Providers: SeriousBroker.it  This test is not yet approved or cleared by the [place] FDA and has been authorized for detection and/or diagnosis of SARS-CoV-2 by FDA under an Emergency Use Authorization (EUA). This EUA will remain in effect (meaning this test can be used) for the duration of the COVID-19 declaration under Section 564(b)(1) of the Act, 21 U.S.C. section 360bbb-3(b)(1), unless the authorization is terminated or revoked.  Performed at [facility], [address] [place], [place] [address]    Sodium 01/04/2021 138  135 - 145 mmol/L Final   Potassium 01/04/2021 3.8  3.5 - 5.1 mmol/L Final   Chloride 01/04/2021 102  98 - 111 mmol/L Final   CO2 01/04/2021 27  22 - 32 mmol/L Final   Glucose, Bld 01/04/2021 107 (A) 70 - 99 mg/dL Final   Glucose reference range applies only to samples taken after fasting for at least 8 hours.   BUN 01/04/2021 16  6 - 20 mg/dL Final   Creatinine, Ser 01/04/2021 0.98  0.61 - 1.24 mg/dL Final   Calcium 19/14/7829 9.5  8.9 - 10.3 mg/dL Final   GFR, Estimated 01/04/2021 >60  >60 mL/min Final   Comment: (NOTE) Calculated using the CKD-EPI Creatinine Equation (2021)    Anion gap 01/04/2021 9  5 - 15 Final   Performed at [facility], [address] [place], [place] [address]  Admission on 11/29/2020, Discharged on 12/03/2020  Component Date Value Ref Range Status   Sodium 11/29/2020 136  135 - 145 mmol/L Final   Potassium 11/29/2020 4.0  3.5 - 5.1 mmol/L Final   Chloride 11/29/2020 105  98 - 111 mmol/L Final   CO2 11/29/2020 22  22 - 32 mmol/L Final   Glucose, Bld 11/29/2020 85  70 - 99 mg/dL Final   Glucose reference range applies only to samples taken after fasting for at least 8 hours.   BUN 11/29/2020  14  6 - 20 mg/dL Final   Creatinine, Ser  11/29/2020 0.99  0.61 - 1.24 mg/dL Final   Calcium 16/01/9603 9.6  8.9 - 10.3 mg/dL Final   Total Protein 54/12/8117 7.7  6.5 - 8.1 g/dL Final   Albumin 14/78/2956 4.0  3.5 - 5.0 g/dL Final   AST 21/30/8657 57 (A) 15 - 41 U/L Final   ALT 11/29/2020 54 (A) 0 - 44 U/L Final   Alkaline Phosphatase 11/29/2020 101  38 - 126 U/L Final   Total Bilirubin 11/29/2020 0.5  0.3 - 1.2 mg/dL Final   GFR, Estimated 11/29/2020 >60  >60 mL/min Final   Comment: (NOTE) Calculated using the CKD-EPI Creatinine Equation (2021)    Anion gap 11/29/2020 9  5 - 15 Final   Performed at [facility], [address] [place], [place] [address]   WBC 11/29/2020 5.5  4.0 - 10.5 K/uL Final   RBC 11/29/2020 4.59  4.22 - 5.81 MIL/uL Final   Hemoglobin 11/29/2020 14.4  13.0 - 17.0 g/dL Final   HCT 29/52/8413 42.2  39.0 - 52.0 % Final   MCV 11/29/2020 91.9  80.0 - 100.0 fL Final   MCH 11/29/2020 31.4  26.0 - 34.0 pg Final   MCHC 11/29/2020 34.1  30.0 - 36.0 g/dL Final   RDW 24/40/1027 13.8  11.5 - 15.5 % Final   Platelets 11/29/2020 319  150 - 400 K/uL Final   nRBC 11/29/2020 0.0  0.0 - 0.2 % Final   Neutrophils Relative % 11/29/2020 54  % Final   Neutro Abs 11/29/2020 3.0  1.7 - 7.7 K/uL Final   Lymphocytes Relative 11/29/2020 36  % Final   Lymphs Abs 11/29/2020 2.0  0.7 - 4.0 K/uL Final   Monocytes Relative 11/29/2020 8  % Final   Monocytes Absolute 11/29/2020 0.4  0.1 - 1.0 K/uL Final   Eosinophils Relative 11/29/2020 2  % Final   Eosinophils Absolute 11/29/2020 0.1  0.0 - 0.5 K/uL Final   Basophils Relative 11/29/2020 0  % Final   Basophils Absolute 11/29/2020 0.0  0.0 - 0.1 K/uL Final   Immature Granulocytes 11/29/2020 0  % Final   Abs Immature Granulocytes 11/29/2020 0.01  0.00 - 0.07 K/uL Final   Performed at [facility], [address] [place], [place] [address]   Opiates 11/29/2020 NONE DETECTED  NONE DETECTED Final   Cocaine 11/29/2020 NONE DETECTED  NONE DETECTED Final   Benzodiazepines  11/29/2020 NONE DETECTED  NONE DETECTED Final   Amphetamines 11/29/2020 NONE DETECTED  NONE DETECTED Final   Tetrahydrocannabinol 11/29/2020 NONE DETECTED  NONE DETECTED Final   Barbiturates 11/29/2020 NONE DETECTED  NONE DETECTED Final   Comment: (NOTE) DRUG SCREEN FOR MEDICAL PURPOSES ONLY.  IF CONFIRMATION IS NEEDED FOR ANY PURPOSE, NOTIFY LAB WITHIN 5 DAYS.  LOWEST DETECTABLE LIMITS FOR URINE DRUG SCREEN Drug Class                     Cutoff (ng/mL) Amphetamine and metabolites    1000 Barbiturate and metabolites    200 Benzodiazepine                 200 Tricyclics and metabolites     300 Opiates and metabolites        300 Cocaine and metabolites        300 THC  50 Performed at [facility], [address], [place], [place] [address]    Alcohol, Ethyl (B) 11/29/2020 <10  <10 mg/dL Final   Comment: (NOTE) Lowest detectable limit for serum alcohol is 10 mg/dL.  For medical purposes only. Performed at [facility], [address] [place], [place] [address]    Acetaminophen (Tylenol), Serum 11/29/2020 <10 (A) 10 - 30 ug/mL Final   Comment: (NOTE) Therapeutic concentrations vary significantly. A range of 10-30 ug/mL  may be an effective concentration for many patients. However, some  are best treated at concentrations outside of this range. Acetaminophen concentrations >150 ug/mL at 4 hours after ingestion  and >50 ug/mL at 12 hours after ingestion are often associated with  toxic reactions.  Performed at [facility], [address] [place], [place] [address]    Salicylate Lvl 11/29/2020 <7.0 (A) 7.0 - 30.0 mg/dL Final   Performed at [facility], [address], [place], [place] [address]   SARS Coronavirus 2 by RT PCR 11/29/2020 NEGATIVE  NEGATIVE Final   Comment: (NOTE) SARS-CoV-2 target nucleic acids are NOT DETECTED.  The SARS-CoV-2 RNA is generally detectable in upper respiratory specimens during the acute  phase of infection. The lowest concentration of SARS-CoV-2 viral copies this assay can detect is 138 copies/mL. A negative result does not preclude SARS-Cov-2 infection and should not be used as the sole basis for treatment or other patient management decisions. A negative result may occur with  improper specimen collection/handling, submission of specimen other than nasopharyngeal swab, presence of viral mutation(s) within the areas targeted by this assay, and inadequate number of viral copies(<138 copies/mL). A negative result must be combined with clinical observations, patient history, and epidemiological information. The expected result is Negative.  Fact Sheet for Patients:  BloggerCourse.com  Fact Sheet for Healthcare Providers:  SeriousBroker.it  This test is no                          t yet approved or cleared by the [place] FDA and  has been authorized for detection and/or diagnosis of SARS-CoV-2 by FDA under an Emergency Use Authorization (EUA). This EUA will remain  in effect (meaning this test can be used) for the duration of the COVID-19 declaration under Section 564(b)(1) of the Act, 21 U.S.C.section 360bbb-3(b)(1), unless the authorization is terminated  or revoked sooner.       Influenza A by PCR 11/29/2020 NEGATIVE  NEGATIVE Final   Influenza B by PCR 11/29/2020 NEGATIVE  NEGATIVE Final   Comment: (NOTE) The Xpert Xpress SARS-CoV-2/FLU/RSV plus assay is intended as an aid in the diagnosis of influenza from Nasopharyngeal swab specimens and should not be used as a sole basis for treatment. Nasal washings and aspirates are unacceptable for Xpert Xpress SARS-CoV-2/FLU/RSV testing.  Fact Sheet for Patients: BloggerCourse.com  Fact Sheet for Healthcare Providers: SeriousBroker.it  This test is not yet approved or cleared by the [place] FDA and has  been authorized for detection and/or diagnosis of SARS-CoV-2 by FDA under an Emergency Use Authorization (EUA). This EUA will remain in effect (meaning this test can be used) for the duration of the COVID-19 declaration under Section 564(b)(1) of the Act, 21 U.S.C. section 360bbb-3(b)(1), unless the authorization is terminated or revoked.  Performed at [facility], [address] [place], [place] [address]    Total CK 12/01/2020 1,423 (A) 49 - 397 U/L Final  Performed at [facility], [address], [place], [place] [address]   WBC 12/02/2020 5.8  4.0 - 10.5 K/uL Final   RBC 12/02/2020 4.59  4.22 - 5.81 MIL/uL Final   Hemoglobin 12/02/2020 14.6  13.0 - 17.0 g/dL Final   HCT 26/94/8546 42.7  39.0 - 52.0 % Final   MCV 12/02/2020 93.0  80.0 - 100.0 fL Final   MCH 12/02/2020 31.8  26.0 - 34.0 pg Final   MCHC 12/02/2020 34.2  30.0 - 36.0 g/dL Final   RDW 27/06/5007 14.2  11.5 - 15.5 % Final   Platelets 12/02/2020 300  150 - 400 K/uL Final   nRBC 12/02/2020 0.0  0.0 - 0.2 % Final   Neutrophils Relative % 12/02/2020 51  % Final   Neutro Abs 12/02/2020 2.9  1.7 - 7.7 K/uL Final   Lymphocytes Relative 12/02/2020 37  % Final   Lymphs Abs 12/02/2020 2.2  0.7 - 4.0 K/uL Final   Monocytes Relative 12/02/2020 9  % Final   Monocytes Absolute 12/02/2020 0.5  0.1 - 1.0 K/uL Final   Eosinophils Relative 12/02/2020 3  % Final   Eosinophils Absolute 12/02/2020 0.2  0.0 - 0.5 K/uL Final   Basophils Relative 12/02/2020 0  % Final   Basophils Absolute 12/02/2020 0.0  0.0 - 0.1 K/uL Final   Immature Granulocytes 12/02/2020 0  % Final   Abs Immature Granulocytes 12/02/2020 0.01  0.00 - 0.07 K/uL Final   Performed at [facility], [address] [place], [place] [address]   Sodium 12/02/2020 138  135 - 145 mmol/L Final   Potassium 12/02/2020 4.1  3.5 - 5.1 mmol/L Final   Chloride 12/02/2020 107  98 - 111 mmol/L Final   CO2 12/02/2020 23  22 - 32 mmol/L Final   Glucose, Bld  12/02/2020 105 (A) 70 - 99 mg/dL Final   Glucose reference range applies only to samples taken after fasting for at least 8 hours.   BUN 12/02/2020 12  6 - 20 mg/dL Final   Creatinine, Ser 12/02/2020 1.10  0.61 - 1.24 mg/dL Final   Calcium 99/37/1696 9.5  8.9 - 10.3 mg/dL Final   Total Protein 78/93/8101 7.7  6.5 - 8.1 g/dL Final   Albumin 75/01/2584 4.0  3.5 - 5.0 g/dL Final   AST 27/78/2423 70 (A) 15 - 41 U/L Final   ALT 12/02/2020 85 (A) 0 - 44 U/L Final   Alkaline Phosphatase 12/02/2020 97  38 - 126 U/L Final   Total Bilirubin 12/02/2020 0.9  0.3 - 1.2 mg/dL Final   GFR, Estimated 12/02/2020 >60  >60 mL/min Final   Comment: (NOTE) Calculated using the CKD-EPI Creatinine Equation (2021)    Anion gap 12/02/2020 8  5 - 15 Final   Performed at [facility], [address], [place], [place] [address]   Total CK 12/02/2020 1,646 (A) 49 - 397 U/L Final   Performed at [facility], [address], [place], [place] [address]   Cholesterol 12/02/2020 123  0 - 200 mg/dL Final   Triglycerides 00/86/7619 74  <150 mg/dL Final   HDL 50/93/2671 35 (A) >40 mg/dL Final   Total CHOL/HDL Ratio 12/02/2020 3.5  RATIO Final   VLDL 12/02/2020 15  0 - 40 mg/dL Final   LDL Cholesterol 12/02/2020 73  0 - 99 mg/dL Final   Comment:        Total Cholesterol/HDL:CHD Risk Coronary Heart Disease Risk Table  Men   Women  1/2 Average Risk   3.4   3.3  Average Risk       5.0   4.4  2 X Average Risk   9.6   7.1  3 X Average Risk  23.4   11.0        Use the calculated Patient Ratio above and the CHD Risk Table to determine the patient's CHD Risk.        ATP III CLASSIFICATION (LDL):  <100     mg/dL   Optimal  811-914  mg/dL   Near or Above                    Optimal  130-159  mg/dL   Borderline  782-956  mg/dL   High  >213     mg/dL   Very High Performed at [facility], [address] [address] [place], [place] [address]    Troponin I (High Sensitivity) 12/02/2020 5  <18  ng/L Final   Comment: (NOTE) Elevated high sensitivity troponin I (hsTnI) values and significant  changes across serial measurements may suggest ACS but many other  chronic and acute conditions are known to elevate hsTnI results.  Refer to the "Links" section for chest pain algorithms and additional  guidance. Performed at [facility], [address], [place], [place] [address]    SARS Coronavirus 2 by RT PCR 12/03/2020 NEGATIVE  NEGATIVE Final   Comment: (NOTE) SARS-CoV-2 target nucleic acids are NOT DETECTED.  The SARS-CoV-2 RNA is generally detectable in upper respiratory specimens during the acute phase of infection. The lowest concentration of SARS-CoV-2 viral copies this assay can detect is 138 copies/mL. A negative result does not preclude SARS-Cov-2 infection and should not be used as the sole basis for treatment or other patient management decisions. A negative result may occur with  improper specimen collection/handling, submission of specimen other than nasopharyngeal swab, presence of viral mutation(s) within the areas targeted by this assay, and inadequate number of viral copies(<138 copies/mL). A negative result must be combined with clinical observations, patient history, and epidemiological information. The expected result is Negative.  Fact Sheet for Patients:  BloggerCourse.com  Fact Sheet for Healthcare Providers:  SeriousBroker.it  This test is no                          t yet approved or cleared by the [place] FDA and  has been authorized for detection and/or diagnosis of SARS-CoV-2 by FDA under an Emergency Use Authorization (EUA). This EUA will remain  in effect (meaning this test can be used) for the duration of the COVID-19 declaration under Section 564(b)(1) of the Act, 21 U.S.C.section 360bbb-3(b)(1), unless the authorization is terminated  or revoked sooner.       Influenza A by PCR  12/03/2020 NEGATIVE  NEGATIVE Final   Influenza B by PCR 12/03/2020 NEGATIVE  NEGATIVE Final   Comment: (NOTE) The Xpert Xpress SARS-CoV-2/FLU/RSV plus assay is intended as an aid in the diagnosis of influenza from Nasopharyngeal swab specimens and should not be used as a sole basis for treatment. Nasal washings and aspirates are unacceptable for Xpert Xpress SARS-CoV-2/FLU/RSV testing.  Fact Sheet for Patients: BloggerCourse.com  Fact Sheet for Healthcare Providers: SeriousBroker.it  This test is not yet approved or cleared by the [place] FDA and has been authorized for detection and/or diagnosis of SARS-CoV-2 by FDA under an Emergency Use Authorization (EUA).  This EUA will remain in effect (meaning this test can be used) for the duration of the COVID-19 declaration under Section 564(b)(1) of the Act, 21 U.S.C. section 360bbb-3(b)(1), unless the authorization is terminated or revoked.  Performed at [facility], [address], [place], [place] [address]    Glucose-Capillary 12/03/2020 96  70 - 99 mg/dL Final   Glucose reference range applies only to samples taken after fasting for at least 8 hours.  Admission on 11/07/2020, Discharged on 11/10/2020  Component Date Value Ref Range Status   SARS Coronavirus 2 by RT PCR 11/07/2020 NEGATIVE  NEGATIVE Final   Comment: (NOTE) SARS-CoV-2 target nucleic acids are NOT DETECTED.  The SARS-CoV-2 RNA is generally detectable in upper respiratory specimens during the acute phase of infection. The lowest concentration of SARS-CoV-2 viral copies this assay can detect is 138 copies/mL. A negative result does not preclude SARS-Cov-2 infection and should not be used as the sole basis for treatment or other patient management decisions. A negative result may occur with  improper specimen collection/handling, submission of specimen other than nasopharyngeal swab, presence of viral  mutation(s) within the areas targeted by this assay, and inadequate number of viral copies(<138 copies/mL). A negative result must be combined with clinical observations, patient history, and epidemiological information. The expected result is Negative.  Fact Sheet for Patients:  BloggerCourse.com  Fact Sheet for Healthcare Providers:  SeriousBroker.it  This test is no                          t yet approved or cleared by the [place] FDA and  has been authorized for detection and/or diagnosis of SARS-CoV-2 by FDA under an Emergency Use Authorization (EUA). This EUA will remain  in effect (meaning this test can be used) for the duration of the COVID-19 declaration under Section 564(b)(1) of the Act, 21 U.S.C.section 360bbb-3(b)(1), unless the authorization is terminated  or revoked sooner.       Influenza A by PCR 11/07/2020 NEGATIVE  NEGATIVE Final   Influenza B by PCR 11/07/2020 NEGATIVE  NEGATIVE Final   Comment: (NOTE) The Xpert Xpress SARS-CoV-2/FLU/RSV plus assay is intended as an aid in the diagnosis of influenza from Nasopharyngeal swab specimens and should not be used as a sole basis for treatment. Nasal washings and aspirates are unacceptable for Xpert Xpress SARS-CoV-2/FLU/RSV testing.  Fact Sheet for Patients: BloggerCourse.com  Fact Sheet for Healthcare Providers: SeriousBroker.it  This test is not yet approved or cleared by the [place] FDA and has been authorized for detection and/or diagnosis of SARS-CoV-2 by FDA under an Emergency Use Authorization (EUA). This EUA will remain in effect (meaning this test can be used) for the duration of the COVID-19 declaration under Section 564(b)(1) of the Act, 21 U.S.C. section 360bbb-3(b)(1), unless the authorization is terminated or revoked.  Performed at [facility], [address], [place],  [place] [address]    Sodium 11/07/2020 142  135 - 145 mmol/L Final   Potassium 11/07/2020 4.2  3.5 - 5.1 mmol/L Final   Chloride 11/07/2020 106  98 - 111 mmol/L Final   CO2 11/07/2020 18 (A) 22 - 32 mmol/L Final   Glucose, Bld 11/07/2020 94  70 - 99 mg/dL Final   Glucose reference range applies only to samples taken after fasting for at least 8 hours.   BUN 11/07/2020 20  6 - 20 mg/dL Final   Creatinine, Ser 11/07/2020 2.65 (A) 0.61 -  1.24 mg/dL Final   Calcium 16/01/9603 10.0  8.9 - 10.3 mg/dL Final   Total Protein 54/12/8117 8.6 (A) 6.5 - 8.1 g/dL Final   Albumin 14/78/2956 4.7  3.5 - 5.0 g/dL Final   AST 21/30/8657 181 (A) 15 - 41 U/L Final   ALT 11/07/2020 77 (A) 0 - 44 U/L Final   Alkaline Phosphatase 11/07/2020 66  38 - 126 U/L Final   Total Bilirubin 11/07/2020 2.0 (A) 0.3 - 1.2 mg/dL Final   GFR, Estimated 11/07/2020 33 (A) >60 mL/min Final   Comment: (NOTE) Calculated using the CKD-EPI Creatinine Equation (2021)    Anion gap 11/07/2020 18 (A) 5 - 15 Final   Performed at [facility], [address], [place], [place] [address]   Alcohol, Ethyl (B) 11/07/2020 <10  <10 mg/dL Final   Comment: (NOTE) Lowest detectable limit for serum alcohol is 10 mg/dL.  For medical purposes only. Performed at [facility], [address], [place], [place] [address]    Opiates 11/07/2020 NONE DETECTED  NONE DETECTED Final   Cocaine 11/07/2020 NONE DETECTED  NONE DETECTED Final   Benzodiazepines 11/07/2020 NONE DETECTED  NONE DETECTED Final   Amphetamines 11/07/2020 NONE DETECTED  NONE DETECTED Final   Tetrahydrocannabinol 11/07/2020 NONE DETECTED  NONE DETECTED Final   Barbiturates 11/07/2020 NONE DETECTED  NONE DETECTED Final   Comment: (NOTE) DRUG SCREEN FOR MEDICAL PURPOSES ONLY.  IF CONFIRMATION IS NEEDED FOR ANY PURPOSE, NOTIFY LAB WITHIN 5 DAYS.  LOWEST DETECTABLE LIMITS FOR URINE DRUG SCREEN Drug Class                     Cutoff (ng/mL) Amphetamine and metabolites     1000 Barbiturate and metabolites    200 Benzodiazepine                 200 Tricyclics and metabolites     300 Opiates and metabolites        300 Cocaine and metabolites        300 THC                            50 Performed at [facility], [address], [place], [place] [address]    WBC 11/07/2020 11.9 (A) 4.0 - 10.5 K/uL Final   RBC 11/07/2020 4.66  4.22 - 5.81 MIL/uL Final   Hemoglobin 11/07/2020 14.6  13.0 - 17.0 g/dL Final   HCT 40/01/2724 43.2  39.0 - 52.0 % Final   MCV 11/07/2020 92.7  80.0 - 100.0 fL Final   MCH 11/07/2020 31.3  26.0 - 34.0 pg Final   MCHC 11/07/2020 33.8  30.0 - 36.0 g/dL Final   RDW 36/64/4034 14.0  11.5 - 15.5 % Final   Platelets 11/07/2020 254  150 - 400 K/uL Final   nRBC 11/07/2020 0.0  0.0 - 0.2 % Final   Neutrophils Relative % 11/07/2020 80  % Final   Neutro Abs 11/07/2020 9.6 (A) 1.7 - 7.7 K/uL Final   Lymphocytes Relative 11/07/2020 11  % Final   Lymphs Abs 11/07/2020 1.3  0.7 - 4.0 K/uL Final   Monocytes Relative 11/07/2020 9  % Final   Monocytes Absolute 11/07/2020 1.1 (A) 0.1 - 1.0 K/uL Final   Eosinophils Relative 11/07/2020 0  % Final   Eosinophils Absolute 11/07/2020 0.0  0.0 - 0.5 K/uL Final   Basophils Relative 11/07/2020 0  % Final  Basophils Absolute 11/07/2020 0.0  0.0 - 0.1 K/uL Final   Immature Granulocytes 11/07/2020 0  % Final   Abs Immature Granulocytes 11/07/2020 0.04  0.00 - 0.07 K/uL Final   Performed at [facility], [address], [place], [place] [address]   Acetaminophen (Tylenol), Serum 11/07/2020 <10 (A) 10 - 30 ug/mL Final   Comment: (NOTE) Therapeutic concentrations vary significantly. A range of 10-30 ug/mL  may be an effective concentration for many patients. However, some  are best treated at concentrations outside of this range. Acetaminophen concentrations >150 ug/mL at 4 hours after ingestion  and >50 ug/mL at 12 hours after ingestion are often associated with  toxic reactions.  Performed at  [facility], [address], [place], [place] [address]    Salicylate Lvl 11/07/2020 <7.0 (A) 7.0 - 30.0 mg/dL Final   Performed at [facility], [address], [place], [place] [address]   Total CK 11/07/2020 10,304 (A) 49 - 397 U/L Final   Comment: RESULTS CONFIRMED BY MANUAL DILUTION Performed at [facility], [address] [place], [place] [address]    Sodium 11/08/2020 134 (A) 135 - 145 mmol/L Final   Potassium 11/08/2020 3.4 (A) 3.5 - 5.1 mmol/L Final   Chloride 11/08/2020 103  98 - 111 mmol/L Final   CO2 11/08/2020 20 (A) 22 - 32 mmol/L Final   Glucose, Bld 11/08/2020 87  70 - 99 mg/dL Final   Glucose reference range applies only to samples taken after fasting for at least 8 hours.   BUN 11/08/2020 19  6 - 20 mg/dL Final   Creatinine, Ser 11/08/2020 1.61 (A) 0.61 - 1.24 mg/dL Final   DELTA CHECK NOTED   Calcium 11/08/2020 8.9  8.9 - 10.3 mg/dL Final   GFR, Estimated 11/08/2020 >60  >60 mL/min Final   Comment: (NOTE) Calculated using the CKD-EPI Creatinine Equation (2021)    Anion gap 11/08/2020 11  5 - 15 Final   Performed at [facility], [address] [place], [place] [address]   Total CK 11/08/2020 [phone] (A) 49 - 397 U/L Final   Comment: RESULTS CONFIRMED BY MANUAL DILUTION Performed at [facility], [address], [place], [place] [address]    Color, Urine 11/08/2020 YELLOW  YELLOW Final   APPearance 11/08/2020 CLEAR  CLEAR Final   Specific Gravity, Urine 11/08/2020 1.011  1.005 - 1.030 Final   pH 11/08/2020 6.0  5.0 - 8.0 Final   Glucose, UA 11/08/2020 NEGATIVE  NEGATIVE mg/dL Final   Hgb urine dipstick 11/08/2020 MODERATE (A) NEGATIVE Final   Bilirubin Urine 11/08/2020 NEGATIVE  NEGATIVE Final   Ketones, ur 11/08/2020 NEGATIVE  NEGATIVE mg/dL Final   Protein, ur 13/24/4010 NEGATIVE  NEGATIVE mg/dL Final   Nitrite 27/25/3664 NEGATIVE  NEGATIVE Final   Leukocytes,Ua 11/08/2020 NEGATIVE  NEGATIVE Final   RBC / HPF 11/08/2020 0-5   0 - 5 RBC/hpf Final   WBC, UA 11/08/2020 0-5  0 - 5 WBC/hpf Final   Bacteria, UA 11/08/2020 RARE (A) NONE SEEN Final   Squamous Epithelial / LPF 11/08/2020 0-5  0 - 5 Final   Hyaline Casts, UA 11/08/2020 PRESENT   Final   Performed at [facility], [address] [place], [place] [address]    Allergies: Patient has no known allergies.    Medical Decision Making  Per Dr. Lucianne Muss who recommends the patient be admitted to the [facility] continuous assessment for mood  stabilization, and safety.  Lab Orders         Resp Panel by RT-PCR (Flu A&B, Covid) Nasopharyngeal Swab         CBC with Differential/Platelet         Comprehensive metabolic panel         Ethanol         POCT Urine Drug Screen - (ICup)         POC SARS Coronavirus 2 Ag-ED - Nasal Swab      Recommendations  Based on my evaluation the patient does not appear to have an emergency medical condition.  Layla Barter, NP 02/01/21  7:04 PM

## 2021-02-01 NOTE — ED Notes (Signed)
Patient hitting the glass and banging on doors. Medications ordered

## 2021-02-01 NOTE — ED Notes (Signed)
Patient admitted to [facility]. Patient denies SI, HI, AVH. Patient was cooperative during the admission assessment. Skin assessment complete. Belongings inventoried. Patient oriented to unit and unit rules. Meal and drinks offered to patient.  Patient verbalized agreement to treatment plans. Patient verbally contracts for safety while hospitalized. Will monitor for safety.

## 2021-02-01 NOTE — ED Provider Notes (Signed)
Behavioral [facility] Urgent Care Medical Screening Exam  Patient Name: Noah Ramsey MRN: [id number] Date of Evaluation: 02/01/21 Chief Complaint:   Diagnosis:  Final diagnoses:  Schizophrenia, unspecified type (HCC)    History of Present illness: Noah Ramsey is a 25 y.o. male. Patient presented to [facility] as a walk in with complaints of "I needed to clear my head". Noah Ramsey, 25 y.o., male patient seen face to face by this provider, consulted with Dr. Lucianne Muss; and chart reviewed on 02/01/21.  On evaluation Noah Ramsey reports "I need resourcees in [place]". Patient reports that his mother is going to drive him to [place] to live. Writer called and spoke with patient's mother and she informed that she had no plans to take him to [place]. Noah Ramsey's mother stated that "it's a delusion".  During evaluation Bradshaw Minihan is standing and pacing in no acute distress.  He is alert/oriented; cooperative; and mood congruent with affect.  He is speaking in a clear tone at moderate volume, and normal pace; with good eye contact.  He is ruminating on driving down to [place]; There is no indication that he is currently responding to internal/external stimuli or experiencing delusional thought content; and he has denied suicidal/self-harm/homicidal ideation, psychosis, and paranoia.   Patient has remained calm throughout assessment and has answered questions appropriately.   At this time Noah Ramsey is educated and verbalizes understanding of mental health resources and other crisis services in the community. He is instructed to call 911 and present to the nearest emergency room should he experience any suicidal/homicidal ideation, auditory/visual/hallucinations, or detrimental worsening of his mental health condition.     Psychiatric Specialty Exam  Presentation  General Appearance:Bizarre; Fairly Groomed  Eye Contact:Good  Speech:Clear and Coherent  Speech  Volume:Normal  Handedness:Right   Mood and Affect  Mood:Anxious  Affect:Flat   Thought Process  Thought Processes:Disorganized  Descriptions of Associations:Loose  Orientation:Full (Time, Place and Person)  Thought Content:Rumination  Diagnosis of Schizophrenia or Schizoaffective disorder in past: Yes  Duration of Psychotic Symptoms: Greater than six months  Hallucinations:None denies today None mentioned today  Ideas of Reference:None  Suicidal Thoughts:No  Homicidal Thoughts:No Without Intent; Without Plan; Without Means to Carry Out; Without Access to Means   Sensorium  Memory:Immediate Poor  Judgment:Fair  Insight:Poor   Executive Functions  Concentration:Good  Attention Span:Good  Recall:Good  Fund of Knowledge:Good  Language:Good   Psychomotor Activity  Psychomotor Activity:Normal   Assets  Assets:Communication Skills; Housing   Sleep  Sleep:Good  Number of hours: 2   No data recorded  Physical Exam: Physical Exam Constitutional:      Appearance: Normal appearance.  HENT:     Nose: Nose normal.  Eyes:     General:        Right eye: No discharge.        Left eye: No discharge.  Musculoskeletal:     Cervical back: Normal range of motion.  Neurological:     General: No focal deficit present.     Mental Status: He is alert.  Psychiatric:        Attention and Perception: Attention normal.        Mood and Affect: Mood normal.        Speech: Speech normal.        Behavior: Behavior is hyperactive. Behavior is cooperative.        Thought Content: Thought content is not paranoid. Thought content does not include homicidal or suicidal ideation.   Review of Systems  Psychiatric/Behavioral:  Negative for substance abuse and suicidal ideas. The patient is nervous/anxious.   All other systems reviewed and are negative. Pulse 92, temperature 97.6 F (36.4 C), temperature source Oral, resp. rate 16, SpO2 100 %. There is no height or  weight on file to calculate BMI.  Musculoskeletal: Strength & Muscle Tone: within normal limits Gait & Station: normal Patient leans: N/A   [facility] MSE Discharge Disposition for Follow up and Recommendations: Based on my evaluation the patient does not appear to have an emergency medical condition and can be discharged with resources and follow up care in outpatient services for Individual Therapy   Mcneil Sober, NP 02/01/2021, 5:31 PM

## 2021-02-01 NOTE — ED Notes (Signed)
P has benny up at the nurses station staring at the woman nurses

## 2021-02-01 NOTE — ED Notes (Signed)
Pt came on unit and we explained to him that he will be on the adult side with the males, due to [place] having a adult male in the Flex area. Pt then said " where the women at"? We explained again he the said " oh hell nawl I am out of here"

## 2021-02-01 NOTE — ED Notes (Signed)
Patient pacing back and fourth on unit. Pt talking with another patient on unit . Pt has eaten his dinner. Will continue to monitor for safety

## 2021-02-01 NOTE — BH Assessment (Addendum)
Comprehensive Clinical Assessment (CCA) Screening, Triage and Referral Note  02/01/2021 Noah Ramsey [id number] Disposition:  Per Dr. Lucianne Muss, pt to be continuously assessed at [facility] overnight.    Pt has an anxious, tense demeanor.  He appears to be responding to internal stimuli in that he does say he hears voices.  His mother said that he will talk to himself.  Patient also talks about being a "black aryan" and how he can hear "voices from different timelines."  Pt has a good appetite per mother.  He has not been sleeping well however.  Per mother, she and patient went to [facility] for intake for ACTT services.  Patient has had multiple psychiatric hospitalizations over the last three months per mother.  He was at [facility] from 08/14 to 09/16, 2022.  Mother says he needs long term services.     Chief Complaint: No chief complaint on file.  Visit Diagnosis: Schizoaffective d/o  Patient Reported Information How did you hear about [place]? Legal System (Pt brought to [facility] on IVC.)  What Is the Reason for Your Visit/Call Today? Pt was brought to [facility] on IVC initiated by family.  According to petition, patient was talking about suicide earler today and pouring bleach around the apartment.  He went to the apartment manager's office and made her uncomfortable.  Per IVC patient has made it to where the family will not be able to get their lease renewed.  Patient reportedly has been acting aggressively and violently and has been breaking things in the house.  Pt had reportedly called 911 today to report a murder of a family member.  During assessment, patient talks about how he did feel like he was suicidal earlier today but that after "working out" he felt better.  Pt said that he felt the same regarding HI.  Pt was asked about hallucinations and he says that he hears voices in [place] and that he is a "black aryan" and has a chip in his brain.  He says "I can hear voices from different timelines."  Pt  perceverates on taking steroids and weight loss supplements to lose weight and build muscle.  Clinician called [name] (petitioner/mother).  Pt has been referred to an ACTT team "[facility]" but they have not started services.  Pt sees Dr. Maggie Schwalbe for medications.  Mother said that she gives his medications and he does take them.  She says that he talks to himself, laughs to himself, tries to fight others.  Pt has not been this way before, he is unpredictable.  Pt has been hospitalized.  several times in the last three months.  Mother said that patient has not been stable since July of this year.  Mother said that "I don't know this patient."  Mother said he has been calling 911 multiple times a day and it has gotten to the point the police will call mother to make sure that everything is okay.  Mother said "it is way bigger than me what is going on here."  Mother said that "this is not him."  Mother said that she did take him to [facility] today for intake.  Mother said that his memory is not good.  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  Mother said that he eats a lot.  He sweats profusely.  How Long Has This Been Causing You Problems? > than 6 months  What Do You Feel Would Help You the Most Today? Treatment for Depression or other  mood problem   Have You Recently Had Any Thoughts About Hurting Yourself? Yes (Earlier today was saying he wanted to kill himself.)  Are You Planning to Commit Suicide/Harm Yourself At This time? No   Have you Recently Had Thoughts About Hurting Someone Noah Ramsey? No  Are You Planning to Harm Someone at This Time? No  Explanation: No data recorded  Have You Used Any Alcohol or Drugs in the Past 24 Hours? No  How Long Ago Did You Use Drugs or Alcohol? No data recorded What Did You Use and How Much? No data recorded  Do You Currently Have a Therapist/Psychiatrist? Yes  Name of Therapist/Psychiatrist: Dr. Maggie Schwalbe   Have You Been Recently  Discharged From Any Office Practice or Programs? No  Explanation of Discharge From Practice/Program: NA    CCA Screening Triage Referral Assessment Type of Contact: Face-to-Face  Telemedicine Service Delivery:   Is this Initial or Reassessment? Initial Assessment  Date Telepsych consult ordered in [facility]:  01/27/21  Time Telepsych consult ordered in [facility]:  2204  Location of Assessment: [facility] Assessment Services  Provider Location: [facility] Assessment Services   Collateral Involvement: mother Noah Ramsey [phone]   Does Patient Have a Court Appointed Legal Guardian? No data recorded Name and Contact of Legal Guardian: No data recorded If Minor and Not Living with Parent(s), Who has Custody? NA  Is CPS involved or ever been involved? Never  Is APS involved or ever been involved? Never   Patient Determined To Be At Risk for Harm To Self or Others Based on Review of Patient Reported Information or Presenting Complaint? Yes, for Self-Harm  Method: No Plan  Availability of Means: No access or NA  Intent: Vague intent or NA  Notification Required: No need or identified person  Additional Information for Danger to Others Potential: Active psychosis  Additional Comments for Danger to Others Potential: NA  Are There Guns or Other Weapons in Your Home? No  Types of Guns/Weapons: No data recorded Are These Weapons Safely Secured?                            -- (No guns in the house)  Who Could Verify You Are Able To Have These Secured: Pt mom reports no guns in the house  Do You Have any Outstanding Charges, Pending Court Dates, Parole/Probation? none  Contacted To Inform of Risk of Harm To Self or Others: Other: Comment (NA)   Does Patient Present under Involuntary Commitment? Yes  IVC Papers Initial File Date: 02/01/21   [place] of Residence: [place]   Patient Currently Receiving the Following Services: Medication Management   Determination of Need:  Urgent (48 hours)   Options For Referral: Other: Comment (Pt to be continuously assessed over night in [facility].)   Discharge Disposition:     [place], LCAS

## 2021-02-01 NOTE — ED Notes (Signed)
Pt has taken off his shirt along with another patient and they bumped chest even though staff was constantly asking him and  the other patient to keep their clothes on. MHT male went out on unit and they they proceeded to laugh and then they put there clothes back on

## 2021-02-01 NOTE — Discharge Instructions (Addendum)
Transfer patient to [facility] to await inpatient treatment.

## 2021-02-02 ENCOUNTER — Other Ambulatory Visit: Payer: Self-pay

## 2021-02-02 ENCOUNTER — Emergency Department (HOSPITAL_COMMUNITY)
Admission: EM | Admit: 2021-02-02 | Discharge: 2021-02-05 | Disposition: A | Discharge status: Home / Self Care | Payer: 59 | Attending: Emergency Medicine | Admitting: Emergency Medicine

## 2021-02-02 DIAGNOSIS — R456 Violent behavior: Secondary | ICD-10-CM | POA: Diagnosis not present

## 2021-02-02 DIAGNOSIS — R4689 Other symptoms and signs involving appearance and behavior: Secondary | ICD-10-CM | POA: Diagnosis not present

## 2021-02-02 DIAGNOSIS — F309 Manic episode, unspecified: Secondary | ICD-10-CM | POA: Insufficient documentation

## 2021-02-02 DIAGNOSIS — R45851 Suicidal ideations: Secondary | ICD-10-CM | POA: Insufficient documentation

## 2021-02-02 DIAGNOSIS — Z79899 Other long term (current) drug therapy: Secondary | ICD-10-CM | POA: Insufficient documentation

## 2021-02-02 DIAGNOSIS — R45850 Homicidal ideations: Secondary | ICD-10-CM | POA: Insufficient documentation

## 2021-02-02 DIAGNOSIS — F209 Schizophrenia, unspecified: Secondary | ICD-10-CM | POA: Insufficient documentation

## 2021-02-02 DIAGNOSIS — Z8659 Personal history of other mental and behavioral disorders: Secondary | ICD-10-CM

## 2021-02-02 DIAGNOSIS — F259 Schizoaffective disorder, unspecified: Secondary | ICD-10-CM | POA: Diagnosis not present

## 2021-02-02 DIAGNOSIS — R440 Auditory hallucinations: Secondary | ICD-10-CM | POA: Diagnosis not present

## 2021-02-02 LAB — CBC WITH DIFFERENTIAL/PLATELET
Abs Immature Granulocytes: 0.01 10*3/uL (ref 0.00–0.07)
Basophils Absolute: 0 10*3/uL (ref 0.0–0.1)
Basophils Relative: 0 %
Eosinophils Absolute: 0.2 10*3/uL (ref 0.0–0.5)
Eosinophils Relative: 4 %
HCT: 41.5 % (ref 39.0–52.0)
Hemoglobin: 14.4 g/dL (ref 13.0–17.0)
Immature Granulocytes: 0 %
Lymphocytes Relative: 41 %
Lymphs Abs: 2.1 10*3/uL (ref 0.7–4.0)
MCH: 31.5 pg (ref 26.0–34.0)
MCHC: 34.7 g/dL (ref 30.0–36.0)
MCV: 90.8 fL (ref 80.0–100.0)
Monocytes Absolute: 0.6 10*3/uL (ref 0.1–1.0)
Monocytes Relative: 11 %
Neutro Abs: 2.2 10*3/uL (ref 1.7–7.7)
Neutrophils Relative %: 44 %
Platelets: 225 10*3/uL (ref 150–400)
RBC: 4.57 MIL/uL (ref 4.22–5.81)
RDW: 13.1 % (ref 11.5–15.5)
WBC: 5.2 10*3/uL (ref 4.0–10.5)
nRBC: 0 % (ref 0.0–0.2)

## 2021-02-02 LAB — RAPID URINE DRUG SCREEN, HOSP PERFORMED
Amphetamines: NOT DETECTED
Barbiturates: NOT DETECTED
Benzodiazepines: POSITIVE — AB
Cocaine: NOT DETECTED
Opiates: NOT DETECTED
Tetrahydrocannabinol: NOT DETECTED

## 2021-02-02 LAB — COMPREHENSIVE METABOLIC PANEL
ALT: 31 U/L (ref 0–44)
AST: 64 U/L — ABNORMAL HIGH (ref 15–41)
Albumin: 3.6 g/dL (ref 3.5–5.0)
Alkaline Phosphatase: 64 U/L (ref 38–126)
Anion gap: 9 (ref 5–15)
BUN: 16 mg/dL (ref 6–20)
CO2: 26 mmol/L (ref 22–32)
Calcium: 9.4 mg/dL (ref 8.9–10.3)
Chloride: 101 mmol/L (ref 98–111)
Creatinine, Ser: 1.07 mg/dL (ref 0.61–1.24)
GFR, Estimated: >60 mL/min (ref >60)
Glucose, Bld: 105 mg/dL — ABNORMAL HIGH (ref 70–99)
Potassium: 4.2 mmol/L (ref 3.5–5.1)
Sodium: 136 mmol/L (ref 135–145)
Total Bilirubin: 1.2 mg/dL (ref 0.3–1.2)
Total Protein: 7.2 g/dL (ref 6.5–8.1)

## 2021-02-02 LAB — CK: Total CK: 2382 U/L — ABNORMAL HIGH (ref 49–397)

## 2021-02-02 LAB — ETHANOL: Alcohol, Ethyl (B): <10 mg/dL (ref <10)

## 2021-02-02 MED ORDER — RISPERIDONE 1 MG PO TBDP
1.0000 mg | ORAL_TABLET | Freq: Two times a day (BID) | ORAL | Status: DC
  Administered 2021-02-02: 1 mg via ORAL
  Filled 2021-02-02: qty 1

## 2021-02-02 MED ORDER — DIPHENHYDRAMINE HCL 50 MG/ML IJ SOLN: 50.0000 mg | Freq: Once | INTRAMUSCULAR | Status: DC | PRN

## 2021-02-02 MED ORDER — LORAZEPAM 2 MG/ML IJ SOLN
INTRAMUSCULAR | Status: AC
  Administered 2021-02-02: 2 mg via INTRAVENOUS
  Filled 2021-02-02: qty 1

## 2021-02-02 MED ORDER — BENZTROPINE MESYLATE 0.5 MG PO TABS: 0.5000 mg | ORAL_TABLET | Freq: Two times a day (BID) | ORAL | Status: DC | PRN

## 2021-02-02 MED ORDER — LORAZEPAM 2 MG/ML IJ SOLN: 2.0000 mg | Freq: Once | INTRAMUSCULAR | Status: AC

## 2021-02-02 MED ORDER — HALOPERIDOL LACTATE 5 MG/ML IJ SOLN: 5.0000 mg | Freq: Once | INTRAMUSCULAR | Status: DC | PRN

## 2021-02-02 MED ORDER — ZIPRASIDONE MESYLATE 20 MG IM SOLR
INTRAMUSCULAR | Status: AC
  Filled 2021-02-02: qty 20

## 2021-02-02 MED ORDER — LORAZEPAM 2 MG/ML IJ SOLN
2.0000 mg | Freq: Once | INTRAMUSCULAR | Status: AC | PRN
  Administered 2021-02-02: 2 mg via INTRAMUSCULAR
  Filled 2021-02-02: qty 1

## 2021-02-02 MED ORDER — SODIUM CHLORIDE 0.9 % IV BOLUS
1000.0000 mL | Freq: Once | INTRAVENOUS | Status: AC
  Administered 2021-02-02: 1000 mL via INTRAVENOUS

## 2021-02-02 MED ORDER — LORAZEPAM 1 MG PO TABS
1.0000 mg | ORAL_TABLET | Freq: Once | ORAL | Status: AC | PRN
  Filled 2021-02-02: qty 1

## 2021-02-02 MED ORDER — STERILE WATER FOR INJECTION IJ SOLN
INTRAMUSCULAR | Status: AC
  Filled 2021-02-02: qty 10

## 2021-02-02 MED ORDER — DIPHENHYDRAMINE HCL 50 MG PO CAPS
50.0000 mg | ORAL_CAPSULE | Freq: Once | ORAL | Status: DC | PRN
  Filled 2021-02-02: qty 1

## 2021-02-02 MED ORDER — HALOPERIDOL LACTATE 5 MG/ML IJ SOLN
5.0000 mg | Freq: Once | INTRAMUSCULAR | Status: AC
  Administered 2021-02-02: 5 mg via INTRAVENOUS
  Filled 2021-02-02: qty 1

## 2021-02-02 MED ORDER — HALOPERIDOL 5 MG PO TABS
5.0000 mg | ORAL_TABLET | Freq: Once | ORAL | Status: DC | PRN
  Filled 2021-02-02: qty 1

## 2021-02-02 NOTE — ED Provider Notes (Signed)
[facility] EMERGENCY DEPARTMENT Provider Note   CSN: [id number] Arrival date & time: 02/02/21  1319     History Chief Complaint  Patient presents with   Manic Behavior    Noah Ramsey is a 25 y.o. male.  HPI This is a 25 year old patient with history of schizophrenia and prior AKI secondary to rhabdomyolysis who presents from [facility] for elevated CK.  Patient has reportedly been aggressively masturbating and has not been complaining of pain in the bilateral upper and lower extremities.  He is unable to provide any additional history.  He reports normal urine output.  Endorses auditory hallucinations of SI and HI with plan.  Reports that he wants to kill both me and the nurse by stabbing.  Denies any chest pain, shortness of breath, fever, or other infectious symptoms.  Denies any recent falls or traumatic injuries.    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     Family History  Problem Relation Age of Onset   Mental illness Brother     Social History   Tobacco Use   Smoking status: Never   Smokeless tobacco: Never  Substance Use Topics   Alcohol use: Never   Drug use: Never    Home Medications Prior to Admission medications   Medication Sig Start Date End Date Taking? Authorizing Provider  benztropine (COGENTIN) 0.5 MG tablet Take 0.5 mg by mouth every 6  (six) hours as needed for tremors.    [provider]  doxepin (SINEQUAN) 10 MG capsule Take 10 mg by mouth at bedtime.    [provider]  gabapentin (NEURONTIN) 300 MG capsule Take 300 mg by mouth at bedtime.    [provider]  hydrOXYzine (ATARAX/VISTARIL) 25 MG tablet Take 25 mg by mouth every 6 (six) hours as needed for anxiety.    [provider]  metFORMIN (GLUCOPHAGE) 500 MG tablet Take 1 tablet (500 mg total) by mouth 2 (two) times daily with a meal. :Medication induced wt management 01/05/21   Armandina Stammer I, NP  polyethylene glycol (MIRALAX / GLYCOLAX) 17 g packet Take 17 g by mouth daily.    [provider]  propranolol (INDERAL) 40 MG tablet Take 40 mg by mouth every 8 (eight) hours as needed (For anxiety).    [provider]  risperiDONE (RISPERDAL) 2 MG tablet Take 2 mg by mouth 2 (two) times daily.    [provider]    Allergies    Patient has no known allergies.  Review of Systems   Review of Systems  Constitutional:  Negative for chills and fever.  HENT:  Negative for ear pain and sore throat.   Eyes:  Negative for pain and visual disturbance.  Respiratory:  Negative for cough and shortness of breath.   Cardiovascular:  Negative for chest pain and palpitations.  Gastrointestinal:  Negative for abdominal pain and vomiting.  Genitourinary:  Negative for dysuria and hematuria.  Musculoskeletal:  Positive for myalgias. Negative for arthralgias and back pain.  Skin:  Negative for color change and rash.  Neurological:  Negative for seizures and syncope.  Psychiatric/Behavioral:  Positive for dysphoric mood, hallucinations and suicidal ideas.   All other systems reviewed and are negative.  Physical Exam Updated Vital Signs BP 115/77 (BP Location: Left Arm)   Pulse 98   Temp 97.6 F (36.4 C) (Oral)   Resp 14   SpO2 98%   Physical Exam Vitals and nursing note reviewed.  Constitutional:      Appearance: He  is well-developed.  HENT:     Head: Normocephalic and atraumatic.  Eyes:     Conjunctiva/sclera: Conjunctivae normal.  Cardiovascular:     Rate and Rhythm: Normal rate and regular rhythm.     Heart sounds: No murmur heard. Pulmonary:     Effort: Pulmonary effort is normal. No respiratory distress.     Breath sounds: Normal breath sounds.  Abdominal:     Palpations: Abdomen is soft.     Tenderness: There is no abdominal tenderness.  Musculoskeletal:     Cervical back: Neck supple.  Skin:    General: Skin is warm and dry.  Neurological:     Mental Status: He is alert.  Psychiatric:        Attention and Perception: He is inattentive. He perceives auditory hallucinations.        Speech: Speech normal.        Behavior: Behavior is agitated.        Thought Content: Thought content includes homicidal and suicidal ideation. Thought content includes homicidal plan.    ED Results / Procedures / Treatments   Labs (all labs ordered are listed, but only abnormal results are displayed) Labs Reviewed  COMPREHENSIVE METABOLIC PANEL - Abnormal; Notable for the following components:      Result Value   Glucose, Bld 105 (*)    AST 64 (*)    All other components within normal limits  RAPID URINE DRUG SCREEN, HOSP PERFORMED - Abnormal; Notable for the following components:   Benzodiazepines POSITIVE (*)    All other components within normal limits  CK - Abnormal; Notable for the following components:   Total CK 2,382 (*)    All other components within normal limits  ETHANOL  CBC WITH DIFFERENTIAL/PLATELET  CBC WITH DIFFERENTIAL/PLATELET  ETHANOL  URINALYSIS, ROUTINE W REFLEX MICROSCOPIC  CK    EKG None  Radiology No results found.  Procedures Procedures   Medications Ordered in ED Medications  sterile water (preservative free) injection (  Not Given 02/03/21 0004)  ziprasidone (GEODON) 20 MG injection (  Not Given 02/03/21 0005)  sodium chloride 0.9 % bolus 1,000 mL (0 mLs  Intravenous Stopped 02/02/21 2353)  sodium chloride 0.9 % bolus 1,000 mL (0 mLs Intravenous Stopped 02/02/21 2353)  haloperidol lactate (HALDOL) injection 5 mg (5 mg Intravenous Given 02/02/21 2232)  LORazepam (ATIVAN) injection 2 mg (2 mg Intravenous Given 02/02/21 2252)    ED Course  I have reviewed the triage vital signs and the nursing notes.  Pertinent labs & imaging results that were available during my care of the patient were reviewed by me and considered in my medical decision making (see chart for details).    MDM Rules/Calculators/A&P                          On arrival patient  is hemodynamically stable and well-appearing.  Reportedly CK is elevated and patient has a history of rhabdomyolysis.  No sign of any traumatic injury but patient does have reported myalgias.  Could be at elevated risk of rhabdo given use of antipsychotic medications.  CBC and CMP unremarkable, notably with normal renal function.  CK elevated at 2300.  Will give 2 L of fluid and reassess CK in 8 hours, 4 AM. If CK downtrending at that time patient medically cleared.  Will require evaluation by psychiatry given patient's statements of hallucinations and SI and HI with plan.   On reassessment patient is agitated given 5 mg of Haldol and 2 mg of Ativan.  Patient continues to be agitated pulling out IVs and not cooperating with nursing staff.  He is a risk to himself and others.  Given 20 mg of Geodon.  Remains stable.   Final Clinical Impression(s) / ED Diagnoses Final diagnoses:  None    Rx / DC Orders ED Discharge Orders     None        Doran Clay, MD 02/03/21 0036    Tegeler, Canary Brim, MD 02/05/21 1258

## 2021-02-02 NOTE — ED Notes (Signed)
Patient sprinting through the unit at high speed

## 2021-02-02 NOTE — ED Notes (Signed)
Instead of using the toilet pt pees on floor vent. Advised him to use toilet.

## 2021-02-02 NOTE — ED Notes (Signed)
Pt increasingly agitated, demanding to go home.  NP Nira Conn notified, PRN meds given.  Pt punched plexiglass after NP Allyson Sabal explained to pt that he could not go home.  Pt tolerated injections without incident.

## 2021-02-02 NOTE — ED Notes (Signed)
Pt. Received lunch

## 2021-02-02 NOTE — ED Notes (Addendum)
Pt keeps taking off his shirt. have to tell him each time to put it back on. ONLY DOES IT WHEN WOMEN ARE AROUND.

## 2021-02-02 NOTE — ED Triage Notes (Signed)
Pt here from [facility] by [employer] d/t pt acting inappropriately. Pt was taking off clothes, and attempting to touch male and male patients. Pt was masturbating and attempting to ejaculate onto others.  Pt is IVC. Changed into purple scrubs and wanded in triage.  Pt states he is here d/t high CK levels because he can not feel anything.

## 2021-02-02 NOTE — ED Provider Notes (Signed)
Emergency Medicine Provider Triage Evaluation Note  Saad Buhl , a 25 y.o. male  was evaluated in triage.  Pt complains of inappropriate behaviors.  Review of Systems  Positive: Manic, touching other people, mastubation Negative: Fever, pain  Physical Exam  BP 123/70 (BP Location: Right Arm)   Pulse (!) 107   Temp 98.7 F (37.1 C) (Oral)   Resp 18   SpO2 97%  Gen:   Awake, no distress   Resp:  Normal effort  MSK:   Moves extremities without difficulty  Other:    Medical Decision Making  Medically screening exam initiated at 1:38 PM.  Appropriate orders placed.  Daundre Biel was informed that the remainder of the evaluation will be completed by another provider, this initial triage assessment does not replace that evaluation, and the importance of remaining in the ED until their evaluation is complete.  Was IVC and at [facility] yesterday but sent here due to being manic, harrassing people around him, mastubating, and touching other people inappropriately. PT denies SI/HI/AVH   Fayrene Helper, PA-C 02/02/21 1342    Blane Ohara, MD 02/08/21 1101

## 2021-02-02 NOTE — ED Notes (Signed)
Pt trying to wrestle with another pt. Redirected and separated them.

## 2021-02-02 NOTE — ED Provider Notes (Signed)
FBC/OBS ASAP Discharge Summary  Date and Time: 02/02/2021 10:01 AM  Name: Noah Ramsey  MRN:  [id number]   Discharge Diagnoses:  Final diagnoses:  Schizophrenia, unspecified type (HCC)    Subjective: Pt was brought to [facility] on IVC initiated by family.  According to petition, patient was talking about suicide earler today and pouring bleach around the apartment.  He went to the apartment manager's office and made her uncomfortable.  Per IVC patient has made it to where the family will not be able to get their lease renewed.  Patient reportedly has been acting aggressively and violently and has been breaking things in the house.  Pt had reportedly called 911 today to report a murder of a family member.  Stay Summary: Patient seen and reevaluated face-to-face by this provider, chart reviewed and case discussed with Dr. Lucianne Muss. On evaluation patient is alert and oriented to person place and time. His thought process is disorganized with delusional thought content. His speech is somewhat garbled and tangential. He is difficult to assess as he rambles, and provides irrelevant information that does not associate with the questions being asked. He denies having thoughts of wanting to hurt himself or others. 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 He reports that he lives with his mother, and states that his mother is his "girlfriend, his daughter, his boo thang" and he just wants to squeeze her. He was observed to be on the unit sticking out his tongue and laughing inappropriately. According to the nursing staff, he has been physically aggressive, banging on the floor, banging on the plexiglass at the nurses station, physically aggressive towards other patients, urinating on the floor, disrobing, and difficult to redirect. Patient has been given IM medications  prn for aggressive behaviors with little effect. Unable to safey manage patient in an open milieu here at the [facility]. Patient will be transported to the [facility] to await inpatient treatment. Report given to Dr. Jodi Mourning. Patient has been faxed out for inpatient psychiatric treatment by the psychiatry team.   Total Time spent with patient: 20 minutes  Past Psychiatric History: Schizophrenia, schizoaffective disorder, hallucinations, aggressive behavior Per Denice Bors, NP., on 01/30/21: Spoke with receptionist at Dr. Maggie Schwalbe office (patients' psychiatrist) Reports that they were unaware that patient had been switched to [facility] while in hospital.  Patient was given Gean Birchwood while at [facility] first loading dose of 234 mg IM on 12/20/20, second loading on 12/24/20 156 mg IM.  The next scheduled dose (maintenance dose) of Invega Sustenna 156 mg was due on 01/21/21.  Patient was given Haldol Decanoate 100 mg on 01/19/21 by his primary psychiatrist (Dr. Maggie Schwalbe).  Informed the receptionist at Dr. Theophilus Kinds off would send over patient's prior discharge hospital instruction and he could decide if he wanted to continue with Haldol Deacon ate or if wanted to switch to the [place]. Patient next appointment with Dr. Maggie Schwalbe is 02/05/21.    Patient has also been referred to [facility] for ACTT services.  Patient's mother is follow up with [facility].  Patient also had referral to [employer] and [employer] for community support.  Medications patient discharged home with after last psychiatric hospitalization 01/05/21 are the following. Cogentin 0.5 mg Q 6 hrs prn tremor Doxepin 10 mg Q hs for sleep Neurontin 300 mg Q hs for agitation  Miralax 17 gm daily Risperdal 2 mg Bid for mood control Propranolol 40 mg Q 8 hrs for anxiety Invega Sustenna 156 mg was due on 01/21/21 when follow up with Dr. Maggie Schwalbe but was given Haldol Decanoate 100 mg on 01/19/21 during psychiatric follow up visit.    Medications  that were discontinued 01/05/21 Diazepam 5 mg Haloperidol 10 mg Haloperidol Decanoate 100 mg Latuda 40 mg   Past Medical History:  Past Medical History:  Diagnosis Date   Elevated CPK    per patient   Schizophrenia (HCC)    No past surgical history on file. Family History:  Family History  Problem Relation Age of Onset   Mental illness Brother    Family Psychiatric History: No hx reported. Social History:  Social History   Substance and Sexual Activity  Alcohol Use Never     Social History   Substance and Sexual Activity  Drug Use Never    Social History   Socioeconomic History   Marital status: Single    Spouse name: Not on file   Number of children: Not on file   Years of education: Not on file   Highest education level: Not on file  Occupational History   Not on file  Tobacco Use   Smoking status: Never   Smokeless tobacco: Never  Substance and Sexual Activity   Alcohol use: Never   Drug use: Never   Sexual activity: Not on file  Other Topics Concern   Not on file  Social History Narrative   Not on file   Social Determinants of Health   Financial Resource Strain: Not on file  Food Insecurity: Not on file  Transportation Needs: Not on file  Physical Activity: Not on file  Stress: Not on file  Social Connections: Not on file   SDOH:  SDOH Screenings   Alcohol Screen: Low Risk    Last Alcohol Screening Score (AUDIT): 0  Depression (PHQ2-9): Not on file  Financial Resource Strain: Not on file  Food Insecurity: Not on file  Housing: Not on file  Physical Activity: Not on file  Social Connections: Not on file  Stress: Not on file  Tobacco Use: Low Risk    Smoking Tobacco Use: Never   Smokeless Tobacco Use: Never  Transportation Needs: Not on file    Current Medications:  Current Facility-Administered Medications  Medication Dose Route Frequency Provider Last Rate Last Admin   acetaminophen (TYLENOL) tablet 650 mg  650 mg Oral Q6H PRN  Shamia Uppal L, NP       alum & mag hydroxide-simeth (MAALOX/MYLANTA) 200-200-20 MG/5ML suspension 30 mL  30 mL Oral Q4H PRN Alexandru Moorer L, NP       diphenhydrAMINE (BENADRYL) capsule 50 mg  50 mg Oral Once PRN Nira Conn A, NP       Or   diphenhydrAMINE (BENADRYL) injection 50 mg  50 mg Intramuscular Once PRN Nira Conn A, NP       gabapentin (NEURONTIN) capsule 300 mg  300 mg Oral QHS Nira Conn A, NP   300 mg at 02/01/21 2220   haloperidol (HALDOL) tablet 5 mg  5 mg Oral Once PRN Nira Conn A, NP       Or   haloperidol lactate (HALDOL) injection 5 mg  5 mg Intramuscular Once PRN Nira Conn A, NP       hydrOXYzine (ATARAX/VISTARIL) tablet 25 mg  25 mg Oral Q6H PRN Nira Conn A, NP   25 mg at 02/01/21 2220  magnesium hydroxide (MILK OF MAGNESIA) suspension 30 mL  30 mL Oral Daily PRN Doloros Kwolek L, NP       metFORMIN (GLUCOPHAGE) tablet 500 mg  500 mg Oral BID WC Nira Conn A, NP   500 mg at 02/02/21 0817   propranolol (INDERAL) tablet 40 mg  40 mg Oral Q8H PRN Jackelyn Poling, NP       Current Outpatient Medications  Medication Sig Dispense Refill   benztropine (COGENTIN) 0.5 MG tablet Take 0.5 mg by mouth every 6 (six) hours as needed for tremors.     doxepin (SINEQUAN) 10 MG capsule Take 10 mg by mouth at bedtime.     gabapentin (NEURONTIN) 300 MG capsule Take 300 mg by mouth at bedtime.     hydrOXYzine (ATARAX/VISTARIL) 25 MG tablet Take 25 mg by mouth every 6 (six) hours as needed for anxiety.     metFORMIN (GLUCOPHAGE) 500 MG tablet Take 1 tablet (500 mg total) by mouth 2 (two) times daily with a meal. :Medication induced wt management 60 tablet 0   paliperidone (INVEGA SUSTENNA) 234 MG/1.5ML SUSY injection Inject 234 mg into the muscle every 28 (twenty-eight) days. See your primary provider to determine next scheduled dose.  Last doses (loading doses given on  12/20/20 (234 mg) IM and 12/24/2020 9156 mg ) IM.  The next dose was scheduled for 01/21/21 which was missed.      polyethylene glycol (MIRALAX / GLYCOLAX) 17 g packet Take 17 g by mouth daily.     propranolol (INDERAL) 40 MG tablet Take 40 mg by mouth every 8 (eight) hours as needed.     risperiDONE (RISPERDAL) 2 MG tablet Take 2 mg by mouth 2 (two) times daily.      PTA Medications: (Not in a hospital admission)   Musculoskeletal  Strength & Muscle Tone: within normal limits Gait & Station: normal Patient leans: N/A  Psychiatric Specialty Exam  Presentation  General Appearance: Bizarre  Eye Contact:Minimal  Speech:Garbled  Speech Volume:Normal  Handedness:Right   Mood and Affect  Mood:Irritable; Anxious  Affect:Congruent   Thought Process  Thought Processes:Disorganized; Irrevelant  Descriptions of Associations:Loose  Orientation:Full (Time, Place and Person)  Thought Content:Delusions; Tangential  Diagnosis of Schizophrenia or Schizoaffective disorder in past: Yes  Duration of Psychotic Symptoms: Greater than six months   Hallucinations:Hallucinations: Auditory  Ideas of Reference:Paranoia  Suicidal Thoughts:Suicidal Thoughts: No  Homicidal Thoughts:Homicidal Thoughts: No   Sensorium  Memory:Immediate Fair; Recent Poor; Remote Poor  Judgment:Poor  Insight:Fair   Executive Functions  Concentration:Fair  Attention Span:Fair  Recall:Fair  Fund of Knowledge:Fair  Language:Fair   Psychomotor Activity  Psychomotor Activity:Psychomotor Activity: Restlessness   Assets  Assets:Housing; Social Support   Sleep  Sleep:Sleep: Poor   No data recorded  Physical Exam  Physical Exam Constitutional:      Appearance: Normal appearance.  Eyes:     Conjunctiva/sclera: Conjunctivae normal.  Cardiovascular:     Rate and Rhythm: Normal rate.  Pulmonary:     Effort: Pulmonary effort is normal.  Musculoskeletal:     Cervical back: Normal range of motion.  Neurological:     Mental Status: He is alert and oriented to person, place, and time.   Review  of Systems  Unable to perform ROS: Psychiatric disorder  Blood pressure (!) 145/76, pulse 93, temperature 98.2 F (36.8 C), temperature source Oral, resp. rate 18, SpO2 99 %. There is no height or weight on file to calculate BMI.  Disposition: Patient is recommended for  inpatient psychiatric treatment. Patient will be transported to the [facility] to await inpatient treatment. Patient is under IVC.  Tamarick Kovalcik L, NP 02/02/2021, 10:01 AM

## 2021-02-02 NOTE — Progress Notes (Signed)
Patient has been denied by [facility] due to no appropriate beds available. Patient meets criteria for [facility] inpatient per Liborio Nixon, NP. Patient has been faxed out to the following facilities:   [facility]  [address], [place] [address] [phone]  [facility]  [address] [place] [address] [phone]  [facility]  [address] [place] [address] [phone]  [facility]  [address] [place] [address] [phone]  [facility]  [address] [place], [place] [address] [phone]  [facility]  [address] [place], [place] [address] [phone]  [facility]  [address] [place] [address] [phone]  [facility]  [address] [place] [address] [phone]  [facility]  [address], [place] [address] [phone]  [facility]  [address], [place] [address] [phone]    Damita Dunnings, MSW, LCSW-A  9:55 AM 02/02/2021

## 2021-02-02 NOTE — ED Notes (Signed)
PT in hallway in bed, sitter at bedside, diet entered and meal tray order placed.

## 2021-02-02 NOTE — ED Notes (Signed)
Pt at window repeating pronto for discharge. Redirected him to sit. Took a while to comply.

## 2021-02-02 NOTE — ED Notes (Signed)
Nurse to nurse report called and [employer] transport arranged to [facility]. Will continue to monitor for safety.

## 2021-02-02 NOTE — ED Notes (Signed)
Pt is awake asking to go home. Staff redirect patient to go back to sleep. Pt is sitting on the bed. No acute distress noted.Will continue to monitor for safety.

## 2021-02-02 NOTE — ED Notes (Addendum)
Patient pacing on unit. Will continue to monitor.

## 2021-02-02 NOTE — ED Notes (Signed)
PT up in hallway saying he is d/c and leaving. Security called and PD in hall to get pt back to his room. Pt medicated with Ativan

## 2021-02-02 NOTE — ED Notes (Signed)
Pt locked self in bathroom security called. PT then changed into purple scrubs and blue pants. PT asking to be d/c or sent to the mental health unit "where it is more fun". Pt asking for meds and not able to be redirected back to room without security. MD notified and Geodon requested. Per security guard he is familiar with pt and states "he always does this and no meds touch him. He tries to fight it and stay up all night doing what he wants to cause a commotion.

## 2021-02-02 NOTE — ED Notes (Signed)
Pt is cursing at staff, taking off clothes.

## 2021-02-02 NOTE — ED Notes (Signed)
Pt is constantly at the nurses station asking for food. Pt has gotten four sandwiches, two bags of pretzels, Nutra grain bar muffins, and five juices. Pt is constantly hitting on glass at nurses station

## 2021-02-02 NOTE — ED Notes (Signed)
Making staff uncomfortable with sexual comments/ statements towards staff

## 2021-02-03 LAB — CK: Total CK: 1808 U/L — ABNORMAL HIGH (ref 49–397)

## 2021-02-03 MED ORDER — ZIPRASIDONE MESYLATE 20 MG IM SOLR
20.0000 mg | Freq: Once | INTRAMUSCULAR | Status: AC
  Administered 2021-02-03: 20 mg via INTRAMUSCULAR
  Filled 2021-02-03: qty 20

## 2021-02-03 MED ORDER — RISPERIDONE 1 MG PO TABS: 2.0000 mg | ORAL_TABLET | Freq: Two times a day (BID) | ORAL | Status: DC

## 2021-02-03 MED ORDER — BENZTROPINE MESYLATE 1 MG PO TABS
0.5000 mg | ORAL_TABLET | Freq: Four times a day (QID) | ORAL | Status: DC | PRN
  Administered 2021-02-03: 0.5 mg via ORAL
  Filled 2021-02-03: qty 1

## 2021-02-03 MED ORDER — DOXEPIN HCL 10 MG PO CAPS
10.0000 mg | ORAL_CAPSULE | Freq: Every day | ORAL | Status: DC
  Administered 2021-02-03 – 2021-02-04 (×2): 10 mg via ORAL
  Filled 2021-02-03 (×3): qty 1

## 2021-02-03 MED ORDER — RISPERIDONE 1 MG PO TABS
2.0000 mg | ORAL_TABLET | Freq: Two times a day (BID) | ORAL | Status: DC
  Administered 2021-02-03 – 2021-02-05 (×4): 2 mg via ORAL
  Filled 2021-02-03 (×4): qty 2

## 2021-02-03 NOTE — BH Assessment (Signed)
Per Remy at [facility], the pt is still under review.    Dannon Nguyenthi D Yehudis Monceaux, MS, LCMHC, CRC Triage Specialist [phone]  

## 2021-02-03 NOTE — ED Notes (Signed)
RN to room to assess patient. Pt noted to be in 4 point restraints due to violent behavior. Pt noted to attempt to elope from unit on multiple occasions. RN informed patient of criteria needed in order to d/c 4 point restraints. Pt not too cooperative. Pt stating he'd behave, but patient proceeded to use swear language at this RN stating, "Get the fuck back here". RN to continue to monitor patient in restraints and to ensure safety of patient and other patients on unit at this time.

## 2021-02-03 NOTE — ED Provider Notes (Addendum)
  Physical Exam  BP (!) 143/84 (BP Location: Left Arm)   Pulse (!) 104   Temp 98.5 F (36.9 C) (Oral)   Resp 18   SpO2 99%   Physical Exam  ED Course/Procedures     Procedures  MDM  Patient became more agitated.  Geodon has been given.  Became more agitated and began to result. 3.  Restraints ordered       Benjiman Core, MD 02/03/21 1212  Patient became belligerent again.  Recently, restraints.  Squaring off and security.  Will be Michael's is only 20 mg of Geodon today.  We will give another 20 mg we will also hold tonight's Risperdal dose    Benjiman Core, MD 02/03/21 1828  Patient more belligerent and wandering again.  Will give PRN of Ativan Haldol and Benadryl  Reviewing records was recently had the Ativan Haldol and Benadryl.  However will do more Ativan and Haldol.  Hold off on the Benadryl for now He is already maxed out on his Geodon today     Benjiman Core, MD 02/04/21 2046    Benjiman Core, MD 02/04/21 2049

## 2021-02-03 NOTE — ED Notes (Signed)
Breakfast Order Placed ?

## 2021-02-03 NOTE — ED Provider Notes (Signed)
Patient care assumed at 2330. Patient here under IVC for agitation. 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 Initial CK is mildly elevated, BMP is within normal limits. Plan to repeat CK after IV fluids.  Repeat CK is down trending. Patient has not needed recurrent sedation. He has been medically cleared for psychiatric evaluation and treatment.   Tilden Fossa, MD 02/03/21 [phone]

## 2021-02-03 NOTE — Progress Notes (Signed)
Per Noah Ramsey, patient meets criteria for inpatient treatment. There are no available or appropriate beds at [facility] today. CSW faxed referrals to the following facilities for review:  [facility]  Pending - No Request Sent N/A [address], [place] [address] [phone] --  [facility]  Pending - No Request Sent N/A [address] [place] [address] [phone] --  [facility]  Pending - No Request [address] [place] [address] [phone] --  [facility]  Pending - No Request Sent N/A [address] [place] [address] [phone] --  [facility]  Pending - No Request Sent N/A [address] [place] [address] [phone] --  [facility]  Pending - No Request Sent N/A [address] [place], [place] [address] [phone] --  [facility]  Pending - No Request Sent N/A [address] [place] [address] [phone] --  [facility]  Pending - No Request Sent N/A [address], [place] [address] [phone] --  [facility]  Pending - No Request Sent N/A [address] [place] [address] [phone] --  [facility]  Pending - No Request Sent N/A [address] [place] [address] [phone] --  [facility]  Pending - No Request Sent N/A [address] [place] [address] [phone] --  [facility]  Pending - No Request Sent N/A [address], [place] [address] [phone] --  [facility]  Pending - No Request Sent N/A [address] [place] [address] [phone] --  [facility]  Pending - No Request Sent N/A [address] [name] [address] [phone] --  [facility]  Pending - No Request Sent N/A [address], [place] [address] [phone] --  [facility]  Pending - No Request Sent N/A [address] [place] [address] [phone] --  [facility]  Pending - No Request Sent N/A [address] [place] [address] [phone] --  [facility]  Pending - No Request Sent N/A [address] [place] [address] [phone] --  [facility]  Pending - No Request Sent N/A [address] [place] [address] [phone] --    TTS will continue to seek bed placement.  Crissie Reese, MSW, LCSW-A, LCAS-A Phone: [phone] Disposition/TOC
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
## 2021-02-04 MED ORDER — ZIPRASIDONE MESYLATE 20 MG IM SOLR
20.0000 mg | Freq: Once | INTRAMUSCULAR | Status: AC
  Administered 2021-02-04: 20 mg via INTRAMUSCULAR
  Filled 2021-02-04: qty 20

## 2021-02-04 MED ORDER — LORAZEPAM 2 MG/ML IJ SOLN
2.0000 mg | Freq: Once | INTRAMUSCULAR | Status: AC
  Administered 2021-02-04: 2 mg via INTRAMUSCULAR
  Filled 2021-02-04: qty 1

## 2021-02-04 MED ORDER — LORAZEPAM 2 MG/ML IJ SOLN
1.0000 mg | Freq: Three times a day (TID) | INTRAMUSCULAR | Status: DC | PRN
  Administered 2021-02-04 – 2021-02-05 (×3): 1 mg via INTRAMUSCULAR
  Filled 2021-02-04 (×4): qty 1

## 2021-02-04 MED ORDER — DIPHENHYDRAMINE HCL 50 MG/ML IJ SOLN
50.0000 mg | Freq: Three times a day (TID) | INTRAMUSCULAR | Status: AC | PRN
Start: 2021-02-04 — End: 2021-02-05
  Administered 2021-02-04 – 2021-02-05 (×3): 50 mg via INTRAMUSCULAR
  Filled 2021-02-04 (×3): qty 1

## 2021-02-04 MED ORDER — HALOPERIDOL LACTATE 5 MG/ML IJ SOLN
5.0000 mg | Freq: Three times a day (TID) | INTRAMUSCULAR | Status: AC | PRN
  Administered 2021-02-04 – 2021-02-05 (×3): 5 mg via INTRAMUSCULAR
  Filled 2021-02-04 (×4): qty 1

## 2021-02-04 NOTE — ED Notes (Signed)
Patient approached this RN, stated "Am I discharged? Or a slave here?" This RN attempted to redirect patient. Patient pushed past this RN and left unit. This RN and [employer] following. Patient eventually agreeable to additional medications and walked back to room.

## 2021-02-04 NOTE — ED Notes (Signed)
Pt came up to the nurse's station stating he is ready for his "injection." This RN asked the pt what he is referring to. The pt replied, "The ativan or whatever." Pt informed that there are no medications due right now and there is nothing indicated at this time. The pt asked when he could have medicine again. Pt informed there are some medications due this evening. Pt offered activities to do; pt declined. Pt asked to return to his room. Pt returned to his room.

## 2021-02-04 NOTE — ED Notes (Signed)
Notified PA Sanders of Pt non compliance at 0315, ordered ativan per mar. Pt at this time still non compliant with staff, will continue to monitor
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
## 2021-02-04 NOTE — ED Notes (Addendum)
Pt came out of the room stating he is ready for discharge. This RN reminded the pt about the plan of care that was spoken of previously and that he is not being discharged. The pt asked, "Is masturbation not allowed?" This RN stated informed the pt that there are other patients here and it is not appropriate to masturbate around staff and other patients. Pt also reminded that we need to be able to see into you room and keep the blinds open. Pt responded, "You need a boyfriend" and returned to his room.

## 2021-02-04 NOTE — ED Notes (Signed)
Received verbal report from Keith RN 

## 2021-02-04 NOTE — ED Notes (Signed)
Patient hitting staff assist button in room. This RN educated patient on proper call system. Patient requested sandwhich and apple juice. This RN gave patient snack along with evening medications. Patient agreeable to vital signs at this time.

## 2021-02-04 NOTE — ED Notes (Addendum)
Pt resting with eyes closed. Chest rise and fall visualized. Restraints are not applied to the pt at this time.

## 2021-02-04 NOTE — ED Notes (Signed)
Pt came to nurse's station asking for a discharge. This RN explained that he is awaiting inpatient treatment at a psych facility. Pt requested he is placed in a facility in [place] or [place]. This RN explained we don't have control over what facility he goes to. Pt requesting a private room at facility he goes to. This RN explained to pt that we don't have control over the bed assignments available and don't know if the facility he goes to will have private or shared rooms. Informed pt that he will be updated when he has an accepting facility.

## 2021-02-04 NOTE — ED Notes (Addendum)
Pt sprayed lotion all over surfaces in room, including the glass on the door. Pt proceeded to urinate in the sink in his room. This RN instructed pt not to urinate in the sink anymore and to use the bathroom for that. Pt given towels to clean his room. Pt stated he wanted the lotion all over his room to make it "smell good." Pt cleaned his room of the lotion and returned to his bed.

## 2021-02-04 NOTE — ED Notes (Signed)
Dinner arrived. 

## 2021-02-04 NOTE — ED Notes (Addendum)
Pt given PO meds earlier than scheduled. Pt behavior ramping up this time. RN has tried multiple times to redirect patient. Pt refusing to follow instructions, as patient is adamant about coming out of room. Patient constantly coming to nurse station and asking if he can "wrestle" with staff. Pt also endorses hearing voices in his head preventing him from resting. See MAR for note.

## 2021-02-04 NOTE — ED Notes (Signed)
Pt attempted to elope. This RN, Consulting civil engineer escorted pt back to room before pt was able to leave unit. Pt states he wants to leave and we can't "hold him hostage here." This RN informed pt about his IVC status and how the process works. This RN asked if the pt is acting like this because there were no medications due for him to get. Pt admitted to acting out to get an injection of medication. Upon further questioning, it was found that the pt was under the impression that the Ativan would help his "muscles move better" and that is why he was requesting it. This RN educated the pt about the medications and how that is unnecessary use of medications. Pt provided with information about exercises and stretches that can be done to help with his muscles. Pt is agreeable at this time. Will continue to monitor for signs of agitation and other changes in behavior. Pt closed door (with blinds open) and sat down on bed.

## 2021-02-04 NOTE — ED Notes (Signed)
Lunch arrived. 

## 2021-02-04 NOTE — ED Notes (Signed)
Pt came up to the nurse's station stating he is ready for his "injection." This RN asked the pt what he is referring to. The pt replied, "The Ativan or whatever." This RN informed the pt that there are no medications due at this time and there is nothing indicated either. Pt offered activities; pt declined. Pt asked to return to his room. Pt returned to his room.

## 2021-02-04 NOTE — ED Notes (Signed)
Pt now awake and eating his lunch.

## 2021-02-04 NOTE — ED Notes (Signed)
Patient showering at this time. 

## 2021-02-04 NOTE — ED Notes (Signed)
Pt running through the department , out the EMS bay with [employer] and security. Pt escorted back to room. Agreeable to receiving medications

## 2021-02-04 NOTE — ED Notes (Signed)
Patient making 2nd phone call for the day.

## 2021-02-04 NOTE — ED Notes (Signed)
[employer] continues to attempt to contain patient. This RN back to zone to assist with different elopement.

## 2021-02-04 NOTE — ED Notes (Addendum)
Pt making 1st phone call for the day.

## 2021-02-04 NOTE — ED Notes (Signed)
Patient aggressive, leaving unit. This RN and [employer] following.

## 2021-02-04 NOTE — ED Notes (Addendum)
Pt pacing around the unit and speaking with vulgar language to staff. Pt told to stay in room. Pt pacing in room, spitting at window of door, and sticking middle fingers at staff and other patients. Provider notified of pt behavior. Medications ordered and administered; see MAR for details.

## 2021-02-04 NOTE — ED Notes (Addendum)
Pt laying in bed resting with eyes closed. Chest rise and fall visualized.

## 2021-02-04 NOTE — ED Notes (Addendum)
Patient laying in bed and intermittently standing to pace in his room. No issues present at this time.

## 2021-02-04 NOTE — ED Notes (Addendum)
Pt came to nurse's station asking for some Benadryl. Informed pt that he already received some and he cannot have more at this time. This RN asked the pt why he wants more Benadryl. Pt responded, "My leg feels heavy." Pt given a game to play with in room.

## 2021-02-04 NOTE — ED Notes (Signed)
Pt refusing to follow commands. Pt still leaving room, playing with hand sanitizer and using it to rub all over body. RN attempted multiple times to redirect patient. MD, Rubin Payor, informed of patient behavior. See [facility]

## 2021-02-04 NOTE — ED Notes (Addendum)
Pt masturbating in room while blinds are open. Pt instructed to stop.

## 2021-02-04 NOTE — ED Notes (Addendum)
Pt attempted to elope. Pt made it out of the unit into a nearby hallway. Security and Patent examiner escorted the pt back to his room. Medications administered. See MAR for details.

## 2021-02-04 NOTE — ED Notes (Addendum)
Pt pacing in room. Pt came to nurse's station and stated, "I am leaving to [place]." This RN explained that he is not leaving at this time and instructed pt to return to room. Pt given snack and pt returned to room.

## 2021-02-04 NOTE — ED Notes (Addendum)
Pt now awake. Pt pleasant and eating breakfast at this time.

## 2021-02-04 NOTE — ED Notes (Signed)
Pt attempting to leave multiple times, pt made it out of purple zone to green zone hallway was escorted back by this rn and [place] NT. Pt still attempting to leave security notified via radio and Pt ran past security and into orange/yellow zone, Verbal order from [place] PA given to [place] RN for 20 mg of geodon, Security brought patient back to unit and geodon given IM as per mar.

## 2021-02-05 ENCOUNTER — Telehealth (HOSPITAL_COMMUNITY): Payer: Self-pay | Admitting: Emergency Medicine

## 2021-02-05 ENCOUNTER — Encounter (HOSPITAL_COMMUNITY): Payer: Self-pay | Admitting: Registered Nurse

## 2021-02-05 DIAGNOSIS — R4689 Other symptoms and signs involving appearance and behavior: Secondary | ICD-10-CM

## 2021-02-05 DIAGNOSIS — F259 Schizoaffective disorder, unspecified: Secondary | ICD-10-CM

## 2021-02-05 MED ORDER — PALIPERIDONE PALMITATE ER 156 MG/ML IM SUSY
156.0000 mg | PREFILLED_SYRINGE | Freq: Once | INTRAMUSCULAR | Status: AC
  Administered 2021-02-05: 156 mg via INTRAMUSCULAR
  Filled 2021-02-05: qty 1

## 2021-02-05 MED ORDER — BENZTROPINE MESYLATE 1 MG PO TABS
1.0000 mg | ORAL_TABLET | Freq: Once | ORAL | Status: AC
  Administered 2021-02-05: 1 mg via ORAL
  Filled 2021-02-05: qty 1

## 2021-02-05 NOTE — ED Notes (Signed)
Patient provided with food and drink with Provider approval. Patient denies any further needs at this time. Stretcher in low and locked position. Side rails up x2. Call bell in reach. Patient verbalizes understanding.   

## 2021-02-05 NOTE — ED Notes (Signed)
Pt wants to talk to the doctor. Will notify MD.

## 2021-02-05 NOTE — ED Notes (Signed)
Patient again becoming increasingly aggressive with staff

## 2021-02-05 NOTE — ED Notes (Signed)
Patient provided with food and drink with Provider approval. Patient denies any further needs at this time. Stretcher in low and locked position. Side rails up x2.

## 2021-02-05 NOTE — ED Notes (Signed)
Pt mom called RN stating she is not willing to accept pt in her home. Pt mom feels like he is not ready to go home especially with his recent anger outbursts. Pt mom also states pt called her a few times today and is upset about mom not wanting him to go home. Pt hung up the phone on mom a few times. Dr.Steinl currently talking to mom on the phone regarding dc plans. Pt cleared by psych at this time and follow up made and medication prescriptions will be provided upon discharge by Dr.Steinl. Pending discharge once MD decides after talking to pt mom. Will continue to monitor.

## 2021-02-05 NOTE — ED Notes (Signed)
Patient again attempting to leave unit. [employer] officer following patient.

## 2021-02-05 NOTE — ED Notes (Signed)
Pt is now calm and cooperative. Keeps on asking staff when he is going home. Redirected by staff that psych is still working on his dc papers and follow ups. Safety precautions maintained.

## 2021-02-05 NOTE — BH Assessment (Signed)
Care Management - Follow Up [facility] Discharges   Writer attempted to make contact with patient today and was unsuccessful.  Writer left a HIPPA compliant voice message.   Per chart review, patient will be following up with his established ACTT Team with [employer].

## 2021-02-05 NOTE — ED Notes (Signed)
Patient called this RN, requested to be discharged. This RN reminded patient that he was not discharged at this time. Patient attempting to leave unit. [employer] and security assisting. . Patient redirected back to room
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
## 2021-02-05 NOTE — ED Notes (Signed)
Pt mom wants to talk to psych as pt is pending dc once follow up appointments set up by psych. Mom states she feels like pt is not ready to go home. Rankin,NP notified of mom's request to contact her. Provider notified.

## 2021-02-05 NOTE — ED Provider Notes (Signed)
Emergency Medicine Observation Re-evaluation Note  Noah Ramsey is a 25 y.o. male, seen on rounds today.  Pt initially presented to the ED for complaints of episodic, recurrent behavior symptoms, and occasional aggressive behavior. Pt currently calm, alert, no distress. No physical c/o.   Physical Exam  BP 132/73 (BP Location: Right Arm)   Pulse 99   Temp 97.8 F (36.6 C) (Oral)   Resp 15   Wt (!) 137.1 kg   SpO2 99%   BMI 47.34 kg/m  Physical Exam General: calm, alert.  Cardiac: regular rate Lungs: breathing comfortably Psych: calm. Pt w normal mood and affect. Pt does not appear to be responding to internal stimuli - no delusions/hallucinantion  ED Course / MDM   I have reviewed the labs performed to date as well as medications administered while in observation.  Recent changes in the last 24 hours include ED observation and bh reassessment.   Plan  [facility] team has reassessed patient and indicates pt psych clear for discharge.  RN indicates pt has received his meds including monthly invega.   I spoke with mom at some length, including discussion BH plan and recommendations. Also discussed should she wish to pursue ALF or other long term residential plan, that best option would be to work w pcp, Therapist, sports, and community act team.  She indicates recently has established with ACT and had one prior meeting.   Vitals normal, no distress, calm/cooperative. Pt currently appears stable for d/c.  Will also ask TOC team if patient qualifies for any home health services, aid, etc, that may provide caregivers additional support.   Return precautions provided.       Cathren Laine, MD 02/05/21 [phone]

## 2021-02-05 NOTE — ED Notes (Signed)
Cleared to go home by Dr.Steinl. Taxi voucher provided to pt. Pt mother called and notified by this RN that pt is on the way home. Pt belongings returned to pt. No security or pharmacy locked belongings. Confirmed with pt. Cleared to go home. Escorted to the waiting room by sitter and [employer] taxi is on the way in less than 10 minutes.

## 2021-02-05 NOTE — BH Assessment (Addendum)
BHH Assessment Progress Note  Per Shuvon Rankin, NP this pt is to receive injectable medication today, after which he will be psychiatrically cleared.  Pt reportedly has had recent connections with Neila Gear, MD, with [facility], and possibly with [facility] for outpatient services.  Shuvon reports that pt claims that his mother is his legal guardian, despite no letter of guardianship being found in his EPIC record.  She asks that I call to clarify details, noting that pt has given verbal consent.  At 11:15 I called the mother, Matson Welch ([phone]).   She reports that pt does not have a legal guardian, but affirms that pt lives with her and that she is involved in his behavioral health needs.  She reports that pt very recently started receiving ACT Team services from [facility], and that he will no longer be seening Dr Jackquline Berlin.  He will, however, continue with programming from [employer].  When I mentioned Shuvon's intent to administer an injectable medication, she became very concerned based on pt's history and recent administration record.  I agreed to ask Shuvon to call her to clarify these details, and I have since reached out to [facility] with return call pending.  Discharge instructions advise pt to continue treatment with the [facility] and with [facility].  Pt's nurse, Ma Katrina, has been notified.  Doylene Canning, [place] Behavioral Health Coordinator [phone]   Addendum:  Denice Bors reports that she has spoken to pt's mother.  Plan to administer medication is unchanged.  I called [facility] to notify them, but they report that pt is not their client.  I then called [facility], who report that pt is a new client of theirs.  I have informed them of injectable medication and have updated discharge instructions to include [facility] contact information.  Shuvon and Ma Katrina have been  notified.  Doylene Canning, [place] Behavioral Health Coordinator [phone]

## 2021-02-05 NOTE — ED Notes (Signed)
Patient approached this RN and stated "Fuck you. You are not my biological father and I want to talk to me biological father"

## 2021-02-05 NOTE — ED Notes (Signed)
Patient resting in stretcher comfortably. Eyes closed, Equal chest rise and fall. Patient alert to verbal stimuli. Call bell in reach, Stretcher in low and locked position. Side rails up x2.   

## 2021-02-05 NOTE — ED Notes (Signed)
Pt walking in the room some and has went to the bathroom a few times since shift change. Pt then took a shower. Cooperative with VS taking. Safety precautions maintained. Pt appears suspicious and stares at the walls sometimes. Ate breakfast. Currently in his room. Will continue to monitor.

## 2021-02-05 NOTE — Progress Notes (Signed)
Patient has been faxed out due to a lack of appropriate beds at [facility]. Patient meets criteria for [facility] inpatient per Liborio Nixon, NP. Patient has been faxed out to the following facilities:   [facility]  [address] [place] [address] [phone]  [facility] [address] [place]  [address], [place] [address] [phone]  [facility]  [address] [place] [address] [phone]  [facility]  [address], [place] [address] [phone]  [facility]  [address] [place] [address] [phone]  [facility]  [address] [place], [place] [address] [phone]  [facility]  [address] [place] [address] [phone]  [facility]  [address] [place] [address] [phone]  [facility]  [address] [place] [address] [phone]  [facility]  [address], [place] [address] [phone]  [facility]  [address], [place] [address] [phone]  [facility]  [address], [place] [address] [phone]  [facility]  [address] [place] [address] [phone]  [facility]  [address] [place] [address] [phone]  [facility]  [address] [place] [address] [phone]  [facility]  [address] [place] [address] [phone]  [facility]  [address], [place] [address] [phone]  [facility]  [address] [place] [address] [phone]  [facility]  [address] [place] [address] [phone]    Damita Dunnings, MSW, LCSW-A  9:46 AM 02/05/2021

## 2021-02-05 NOTE — ED Notes (Signed)
IVC papers rescinded by Dr.Steinl at this time. Copy in medical records.

## 2021-02-05 NOTE — ED Notes (Addendum)
Pt has been walking some in the unit. Ate his lunch. Cooperative at this time.

## 2021-02-05 NOTE — ED Notes (Signed)
Patient redirected by [employer] back to bed.

## 2021-02-05 NOTE — ED Notes (Signed)
Spoke to psych via TTS at this time.

## 2021-02-05 NOTE — Discharge Instructions (Addendum)
It was our pleasure to provide your ER care today - we hope that you feel better. Take meds as prescribed by your behavior health provider - listed on this AVS.  In addition, follow up for your Invega Sustenna 156 mg monthly injection - you received a dose today, so your next dose is due one month from today - discuss with your behavioral health provider at follow up.  Also, should you wish to pursue adult living facility or other longer term residential placement, please pursue with your doctor, psychiatrist, and ACT team/community mental health provider (unfortunately, while we do empathize with our patients and families, the ER is not able to find group home/ALF placement for our patients). Also see resource guide provided for other potential options, help at home, etc.   For your behavioral health needs you are advised to follow up closely with your current outpatient providers in the next 1-2 weeks:       [facility]      [address]      [place], [place] [address]      [phone]      24 hour crisis number: [phone]        [facility]      [address]      [place], [place] [address]      [phone]   Return to ER if worse, new symptoms, fevers, trouble breathing, or other emergency concern.

## 2021-02-05 NOTE — ED Notes (Signed)
Pt has been calm and cooperative today. Had to be redirected some by staff this AM. Pt is compliant with unit rules. Took meds with no complaints. Safety precautions maintained. Will continue to monitor.

## 2021-02-06 ENCOUNTER — Ambulatory Visit (HOSPITAL_COMMUNITY)
Admission: EM | Admit: 2021-02-06 | Discharge: 2021-02-07 | Disposition: A | Discharge status: Home / Self Care | Payer: 59

## 2021-02-06 ENCOUNTER — Telehealth: Payer: Self-pay

## 2021-02-06 DIAGNOSIS — F29 Unspecified psychosis not due to a substance or known physiological condition: Secondary | ICD-10-CM | POA: Diagnosis not present

## 2021-02-06 DIAGNOSIS — F209 Schizophrenia, unspecified: Secondary | ICD-10-CM | POA: Diagnosis not present

## 2021-02-06 DIAGNOSIS — R45851 Suicidal ideations: Secondary | ICD-10-CM | POA: Insufficient documentation
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
## 2021-02-06 NOTE — Telephone Encounter (Signed)
Transition Care Management Follow-up Telephone Call Date of discharge and from where: 02/05/2021-[place]  How have you been since you were released from the hospital? Patient stated he is doing fine.  Any questions or concerns? No  Items Reviewed: Did the pt receive and understand the discharge instructions provided? Yes  Medications obtained and verified?  No medications sent to the pharmacy. Other? No  Any new allergies since your discharge? No  Dietary orders reviewed? No Do you have support at home? Yes   Home Care and Equipment/Supplies: Were home health services ordered? not applicable If so, what is the name of the agency? N/A  Has the agency set up a time to come to the patient's home? not applicable Were any new equipment or medical supplies ordered?  No What is the name of the medical supply agency? N/A Were you able to get the supplies/equipment? not applicable Do you have any questions related to the use of the equipment or supplies? No  Functional Questionnaire: (I = Independent and D = Dependent) ADLs: I  Bathing/Dressing- I  Meal Prep- I  Eating- I  Maintaining continence- I  Transferring/Ambulation- I  Managing Meds- I  Follow up appointments reviewed:  PCP Hospital f/u appt confirmed? No  . Specialist Hospital f/u appt confirmed? No   Are transportation arrangements needed? No  If their condition worsens, is the pt aware to call PCP or go to the Emergency Dept.? Yes Was the patient provided with contact information for the PCP's office or ED? Yes Was to pt encouraged to call back with questions or concerns? Yes

## 2021-02-06 NOTE — Discharge Instructions (Signed)
  Discharge recommendations:  Patient is to take medications as prescribed. Please see information for follow-up appointment with psychiatry and therapy. Please follow up with your primary care provider for all medical related needs.   Therapy: We recommend that patient participate in individual therapy to address mental health concerns.   Atypical antipsychotics: If you are prescribed an atypical antipsychotic, it is recommended that your height, weight, BMI, blood pressure, fasting lipid panel, and fasting blood sugar be monitored by your outpatient providers.  Safety:  The patient should abstain from use of illicit substances/drugs and abuse of any medications. If symptoms worsen or do not continue to improve or if the patient becomes actively suicidal or homicidal then it is recommended that the patient return to the closest hospital emergency department, the [facility], or call 911 for further evaluation and treatment. National Suicide Prevention Lifeline 1-800-SUICIDE or 786-510-2443.  About 988 988 offers 24/7 access to trained crisis counselors who can help people experiencing mental health-related distress. People can call or text 988 or chat 988lifeline.org for themselves or if they are worried about a loved one who may need crisis support.
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
## 2021-02-06 NOTE — Progress Notes (Signed)
   02/06/21 2208  [facility] Triage Screening (Walk-ins at [facility] only)  How Did You Hear About [place]? Legal System  What Is the Reason for Your Visit/Call Today? Noah Ramsey was transported to [facility] by [employer] for evaluation of suicidal ideation. [employer] states that pt has been calling his parents repeatedly telling them that he is having suicidal thoughts and wants to overdose on drugs. Pt initially denied SI but admitted that he is having SI with plans of overdosing on drugs. Pt denies HI. Pt states that he is hearing "whispers" and he has his entire life. Pt is under care of Dr. Maggie Schwalbe and has had multiple ED and [facility] visits in 2022. Pt denies any substance use.  How Long Has This Been Causing You Problems? > than 6 months  Have You Recently Had Any Thoughts About Hurting Yourself? Yes  How long ago did you have thoughts about hurting yourself? today  Are You Planning to Commit Suicide/Harm Yourself At This time? Yes (plan to overdose on drugs)  Have you Recently Had Thoughts About Hurting Someone [name]? No  Are You Planning To Harm Someone At This Time? No  Are you currently experiencing any auditory, visual or other hallucinations? Yes  Please explain the hallucinations you are currently experiencing: auditory: hear whispers  Have You Used Any Alcohol or Drugs in the Past 24 Hours? No  Do you have any current medical co-morbidities that require immediate attention? No  What Do You Feel Would Help You the Most Today? Treatment for Depression or other mood problem  If access to [facility] was not available, would you have sought care in the Emergency Department? Yes  Determination of Need Emergent (2 hours)  Options For Referral Medication Management;Outpatient Therapy;Inpatient Hospitalization;Facility-Based Crisis

## 2021-02-07 ENCOUNTER — Telehealth (HOSPITAL_COMMUNITY): Payer: Self-pay | Admitting: Emergency Medicine

## 2021-02-07 DIAGNOSIS — Z20822 Contact with and (suspected) exposure to covid-19: Secondary | ICD-10-CM | POA: Diagnosis not present

## 2021-02-07 DIAGNOSIS — R456 Violent behavior: Secondary | ICD-10-CM | POA: Diagnosis not present

## 2021-02-07 NOTE — ED Notes (Signed)
Patient given AVS instructions - patient verbalizes understanding - walked patient out and [employer] called for transport

## 2021-02-07 NOTE — BH Assessment (Signed)
Care Management - Follow Up [facility] Discharges   Writer attempted to make contact with patient today and was unsuccessful.  Writer left a HIPPA compliant voice message.   

## 2021-02-08 DIAGNOSIS — R4689 Other symptoms and signs involving appearance and behavior: Secondary | ICD-10-CM | POA: Diagnosis not present

## 2021-02-08 DIAGNOSIS — F209 Schizophrenia, unspecified: Secondary | ICD-10-CM | POA: Diagnosis not present

## 2021-02-09 DIAGNOSIS — F209 Schizophrenia, unspecified: Secondary | ICD-10-CM | POA: Diagnosis not present

## 2021-02-10 DIAGNOSIS — F209 Schizophrenia, unspecified: Secondary | ICD-10-CM | POA: Diagnosis not present

## 2021-02-13 DIAGNOSIS — F918 Other conduct disorders: Secondary | ICD-10-CM | POA: Diagnosis not present

## 2021-02-13 DIAGNOSIS — F209 Schizophrenia, unspecified: Secondary | ICD-10-CM | POA: Diagnosis not present

## 2021-02-14 DIAGNOSIS — F209 Schizophrenia, unspecified: Secondary | ICD-10-CM | POA: Diagnosis not present

## 2021-02-14 DIAGNOSIS — F918 Other conduct disorders: Secondary | ICD-10-CM | POA: Diagnosis not present

## 2021-02-15 DIAGNOSIS — F209 Schizophrenia, unspecified: Secondary | ICD-10-CM | POA: Diagnosis not present

## 2021-02-15 DIAGNOSIS — F918 Other conduct disorders: Secondary | ICD-10-CM | POA: Diagnosis not present

## 2021-02-16 ENCOUNTER — Telehealth: Payer: Self-pay

## 2021-02-16 NOTE — Telephone Encounter (Signed)
Transition Care Management Follow-up Telephone Call Date of discharge and from where: 02/15/2021-[facility]  How have you been since you were released from the hospital? Patient stated he is doing fine.  Any questions or concerns? No  Items Reviewed: Did the pt receive and understand the discharge instructions provided? Yes  Medications obtained and verified? Yes  Other? No  Any new allergies since your discharge? No  Dietary orders reviewed? No Do you have support at home? Yes   Home Care and Equipment/Supplies: Were home health services ordered? not applicable If so, what is the name of the agency? N/A  Has the agency set up a time to come to the patient's home? not applicable Were any new equipment or medical supplies ordered?  No What is the name of the medical supply agency? N/A Were you able to get the supplies/equipment? not applicable Do you have any questions related to the use of the equipment or supplies? No  Functional Questionnaire: (I = Independent and D = Dependent) ADLs: I  Bathing/Dressing- I  Meal Prep- I  Eating- I  Maintaining continence- I  Transferring/Ambulation- I  Managing Meds- I  Follow up appointments reviewed:  PCP Hospital f/u appt confirmed? No   Specialist Hospital f/u appt confirmed? No   Are transportation arrangements needed? No  If their condition worsens, is the pt aware to call PCP or go to the Emergency Dept.? Yes Was the patient provided with contact information for the PCP's office or ED? Yes Was to pt encouraged to call back with questions or concerns? Yes

## 2021-02-17 ENCOUNTER — Other Ambulatory Visit: Payer: Self-pay

## 2021-02-17 ENCOUNTER — Encounter (HOSPITAL_COMMUNITY): Payer: Self-pay | Admitting: Emergency Medicine

## 2021-02-17 ENCOUNTER — Emergency Department (HOSPITAL_COMMUNITY)
Admission: EM | Admit: 2021-02-17 | Discharge: 2021-02-18 | Disposition: A | Discharge status: Home / Self Care | Payer: 59 | Attending: Physician Assistant | Admitting: Physician Assistant

## 2021-02-17 DIAGNOSIS — Z591 Inadequate housing: Secondary | ICD-10-CM | POA: Diagnosis not present

## 2021-02-17 DIAGNOSIS — Z5321 Procedure and treatment not carried out due to patient leaving prior to being seen by health care provider: Secondary | ICD-10-CM | POA: Insufficient documentation

## 2021-02-17 DIAGNOSIS — R61 Generalized hyperhidrosis: Secondary | ICD-10-CM | POA: Insufficient documentation

## 2021-02-17 LAB — COMPREHENSIVE METABOLIC PANEL
ALT: 29 U/L (ref 0–44)
AST: 38 U/L (ref 15–41)
Albumin: 3.5 g/dL (ref 3.5–5.0)
Alkaline Phosphatase: 66 U/L (ref 38–126)
Anion gap: 10 (ref 5–15)
BUN: 14 mg/dL (ref 6–20)
CO2: 23 mmol/L (ref 22–32)
Calcium: 9.4 mg/dL (ref 8.9–10.3)
Chloride: 103 mmol/L (ref 98–111)
Creatinine, Ser: 1 mg/dL (ref 0.61–1.24)
GFR, Estimated: >60 mL/min (ref >60)
Glucose, Bld: 106 mg/dL — ABNORMAL HIGH (ref 70–99)
Potassium: 3.8 mmol/L (ref 3.5–5.1)
Sodium: 136 mmol/L (ref 135–145)
Total Bilirubin: 0.5 mg/dL (ref 0.3–1.2)
Total Protein: 7.5 g/dL (ref 6.5–8.1)

## 2021-02-17 LAB — CBC WITH DIFFERENTIAL/PLATELET
Abs Immature Granulocytes: 0.01 10*3/uL (ref 0.00–0.07)
Basophils Absolute: 0 10*3/uL (ref 0.0–0.1)
Basophils Relative: 0 %
Eosinophils Absolute: 0.2 10*3/uL (ref 0.0–0.5)
Eosinophils Relative: 3 %
HCT: 42.4 % (ref 39.0–52.0)
Hemoglobin: 14.6 g/dL (ref 13.0–17.0)
Immature Granulocytes: 0 %
Lymphocytes Relative: 47 %
Lymphs Abs: 2.3 10*3/uL (ref 0.7–4.0)
MCH: 31.3 pg (ref 26.0–34.0)
MCHC: 34.4 g/dL (ref 30.0–36.0)
MCV: 90.8 fL (ref 80.0–100.0)
Monocytes Absolute: 0.4 10*3/uL (ref 0.1–1.0)
Monocytes Relative: 8 %
Neutro Abs: 2.1 10*3/uL (ref 1.7–7.7)
Neutrophils Relative %: 42 %
Platelets: 266 10*3/uL (ref 150–400)
RBC: 4.67 MIL/uL (ref 4.22–5.81)
RDW: 13.3 % (ref 11.5–15.5)
WBC: 5.1 10*3/uL (ref 4.0–10.5)
nRBC: 0 % (ref 0.0–0.2)

## 2021-02-17 LAB — URINALYSIS, ROUTINE W REFLEX MICROSCOPIC
Bacteria, UA: NONE SEEN
Bilirubin Urine: NEGATIVE
Glucose, UA: NEGATIVE mg/dL
Ketones, ur: NEGATIVE mg/dL
Leukocytes,Ua: NEGATIVE
Nitrite: NEGATIVE
Protein, ur: NEGATIVE mg/dL
Specific Gravity, Urine: 1.008 (ref 1.005–1.030)
pH: 6 (ref 5.0–8.0)

## 2021-02-17 LAB — RAPID URINE DRUG SCREEN, HOSP PERFORMED
Amphetamines: NOT DETECTED
Barbiturates: NOT DETECTED
Benzodiazepines: NOT DETECTED
Cocaine: NOT DETECTED
Opiates: NOT DETECTED
Tetrahydrocannabinol: NOT DETECTED

## 2021-02-17 LAB — CK: Total CK: 1168 U/L — ABNORMAL HIGH (ref 49–397)

## 2021-02-17 LAB — ETHANOL: Alcohol, Ethyl (B): <10 mg/dL (ref <10)

## 2021-02-17 LAB — SALICYLATE LEVEL: Salicylate Lvl: <7 mg/dL — ABNORMAL LOW (ref 7.0–30.0)

## 2021-02-17 LAB — ACETAMINOPHEN LEVEL: Acetaminophen (Tylenol), Serum: <10 ug/mL — ABNORMAL LOW (ref 10–30)

## 2021-02-17 LAB — TROPONIN I (HIGH SENSITIVITY): Troponin I (High Sensitivity): 4 ng/L (ref <18)
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
## 2021-02-18 ENCOUNTER — Ambulatory Visit (HOSPITAL_COMMUNITY)
Admission: EM | Admit: 2021-02-18 | Discharge: 2021-02-18 | Disposition: A | Discharge status: Home / Self Care | Payer: 59

## 2021-02-18 DIAGNOSIS — F209 Schizophrenia, unspecified: Secondary | ICD-10-CM

## 2021-02-18 LAB — TROPONIN I (HIGH SENSITIVITY): Troponin I (High Sensitivity): 4 ng/L (ref <18)
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
## 2021-02-18 NOTE — Discharge Instructions (Addendum)
  Discharge recommendations:  Patient is to take medications as prescribed. Please see information for follow-up appointment with psychiatry and therapy. Please follow up with your primary care provider for all medical related needs.  Follow up with ACT team [facility] for follow up care.  Therapy: We recommend that patient participate in individual therapy to address mental health concerns.  Medications: The parent/guardian is to contact a medical professional and/or outpatient provider to address any new side effects that develop. Parent/guardian should update outpatient providers of any new medications and/or medication changes.   Atypical antipsychotics: If you are prescribed an atypical antipsychotic, it is recommended that your height, weight, BMI, blood pressure, fasting lipid panel, and fasting blood sugar be monitored by your outpatient providers.  Safety:  The patient should abstain from use of illicit substances/drugs and abuse of any medications. If symptoms worsen or do not continue to improve or if the patient becomes actively suicidal or homicidal then it is recommended that the patient return to the closest hospital emergency department, the [facility], or call 911 for further evaluation and treatment. National Suicide Prevention Lifeline 1-800-SUICIDE or 3343793650.  About 988 988 offers 24/7 access to trained crisis counselors who can help people experiencing mental health-related distress. People can call or text 988 or chat 988lifeline.org for themselves or if they are worried about a loved one who may need crisis support.
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
## 2021-02-18 NOTE — ED Notes (Signed)
Discharge instructions provided and Pt stated understanding. Pt alert, orient and ambulatory prior to d/c from facility. Personal belongings returned from the blue locker. Pt escorted to the front lobby to d/c from facility. Safety maintained.

## 2021-02-19 ENCOUNTER — Telehealth: Payer: Self-pay

## 2021-02-19 NOTE — Telephone Encounter (Signed)
Transition Care Management Unsuccessful Follow-up Telephone Call  Date of discharge and from where:  02/18/2021 from [facility]  Attempts:  1st Attempt  Reason for unsuccessful TCM follow-up call:  Unable to reach patient

## 2021-02-20 ENCOUNTER — Telehealth (HOSPITAL_COMMUNITY): Payer: Self-pay | Admitting: Emergency Medicine

## 2021-02-20 NOTE — Telephone Encounter (Signed)
Transition Care Management Unsuccessful Follow-up Telephone Call  Date of discharge and from where:  02/18/2021-[place]  Attempts:  2nd Attempt  Reason for unsuccessful TCM follow-up call:  Unable to reach patient

## 2021-02-20 NOTE — BH Assessment (Signed)
Care Management - Follow Up [facility] Discharges   Writer attempted to make contact with patient today and was unsuccessful.  Writer left a HIPPA compliant voice message.   

## 2021-02-21 NOTE — Telephone Encounter (Signed)
Transition Care Management Unsuccessful Follow-up Telephone Call  Date of discharge and from where:  02/18/2021-[place]   Attempts:  3rd Attempt  Reason for unsuccessful TCM follow-up call:  Unable to reach patient

## 2021-03-20 DIAGNOSIS — R6889 Other general symptoms and signs: Secondary | ICD-10-CM | POA: Diagnosis not present

## 2021-04-16 ENCOUNTER — Emergency Department (HOSPITAL_COMMUNITY): Payer: Medicare Other

## 2021-04-16 ENCOUNTER — Other Ambulatory Visit: Payer: Self-pay

## 2021-04-16 ENCOUNTER — Emergency Department (HOSPITAL_COMMUNITY)
Admission: EM | Admit: 2021-04-16 | Discharge: 2021-04-17 | Disposition: A | Discharge status: Home / Self Care | Payer: Medicare Other | Attending: Emergency Medicine | Admitting: Emergency Medicine

## 2021-04-16 ENCOUNTER — Encounter (HOSPITAL_COMMUNITY): Payer: Self-pay

## 2021-04-16 DIAGNOSIS — R109 Unspecified abdominal pain: Secondary | ICD-10-CM | POA: Diagnosis not present

## 2021-04-16 DIAGNOSIS — R309 Painful micturition, unspecified: Secondary | ICD-10-CM | POA: Diagnosis not present

## 2021-04-16 DIAGNOSIS — R1011 Right upper quadrant pain: Secondary | ICD-10-CM | POA: Diagnosis not present

## 2021-04-16 LAB — COMPREHENSIVE METABOLIC PANEL
ALT: 43 U/L (ref 0–44)
AST: 44 U/L — ABNORMAL HIGH (ref 15–41)
Albumin: 4 g/dL (ref 3.5–5.0)
Alkaline Phosphatase: 53 U/L (ref 38–126)
Anion gap: 9 (ref 5–15)
BUN: 14 mg/dL (ref 6–20)
CO2: 25 mmol/L (ref 22–32)
Calcium: 9.3 mg/dL (ref 8.9–10.3)
Chloride: 104 mmol/L (ref 98–111)
Creatinine, Ser: 1.06 mg/dL (ref 0.61–1.24)
GFR, Estimated: >60 mL/min (ref >60)
Glucose, Bld: 96 mg/dL (ref 70–99)
Potassium: 4 mmol/L (ref 3.5–5.1)
Sodium: 138 mmol/L (ref 135–145)
Total Bilirubin: 0.6 mg/dL (ref 0.3–1.2)
Total Protein: 8.4 g/dL — ABNORMAL HIGH (ref 6.5–8.1)

## 2021-04-16 LAB — CBC WITH DIFFERENTIAL/PLATELET
Abs Immature Granulocytes: 0.01 10*3/uL (ref 0.00–0.07)
Basophils Absolute: 0 10*3/uL (ref 0.0–0.1)
Basophils Relative: 0 %
Eosinophils Absolute: 0.1 10*3/uL (ref 0.0–0.5)
Eosinophils Relative: 2 %
HCT: 41.6 % (ref 39.0–52.0)
Hemoglobin: 13.9 g/dL (ref 13.0–17.0)
Immature Granulocytes: 0 %
Lymphocytes Relative: 46 %
Lymphs Abs: 2.3 10*3/uL (ref 0.7–4.0)
MCH: 30.8 pg (ref 26.0–34.0)
MCHC: 33.4 g/dL (ref 30.0–36.0)
MCV: 92 fL (ref 80.0–100.0)
Monocytes Absolute: 0.6 10*3/uL (ref 0.1–1.0)
Monocytes Relative: 12 %
Neutro Abs: 2 10*3/uL (ref 1.7–7.7)
Neutrophils Relative %: 40 %
Platelets: 216 10*3/uL (ref 150–400)
RBC: 4.52 MIL/uL (ref 4.22–5.81)
RDW: 14 % (ref 11.5–15.5)
WBC: 5 10*3/uL (ref 4.0–10.5)
nRBC: 0 % (ref 0.0–0.2)

## 2021-04-16 LAB — URINALYSIS, ROUTINE W REFLEX MICROSCOPIC
Bilirubin Urine: NEGATIVE
Glucose, UA: NEGATIVE mg/dL
Ketones, ur: NEGATIVE mg/dL
Leukocytes,Ua: NEGATIVE
Nitrite: NEGATIVE
Protein, ur: NEGATIVE mg/dL
Specific Gravity, Urine: 1.02 (ref 1.005–1.030)
pH: 6 (ref 5.0–8.0)

## 2021-04-16 LAB — LIPASE, BLOOD: Lipase: 30 U/L (ref 11–51)

## 2021-04-16 NOTE — ED Triage Notes (Signed)
Pt c/o back pain on the right side starting this morning and remaining constant all day. Pt states pain is worse with movement. Pain 8/10 sitting. Pt denies injuries/trauma.

## 2021-04-16 NOTE — ED Provider Notes (Signed)
[facility] DEPT Provider Note   CSN: [id number] Arrival date & time: 04/16/21  1646     History Chief Complaint  Patient presents with   Back Pain    Rudolfo Ramsey is a 25 y.o. male with a history of schizophrenia presents to the emergency department with right upper quadrant abdominal pain that radiates into the right back and flank.  Patient reports immediate pain started this morning.  Initially it was more in his back but is now specifically in the right upper quadrant.  He reports that moving, sitting and walking make his symptoms worse.  He denies nausea, vomiting or diarrhea.  No previous abdominal surgeries.  Patient denies falls or known injuries.  Denies numbness, tingling or weakness in his back.  Denies loss of bowel or bladder control.  Patient reports that the pain did seem to get worse when eating earlier today.  No alleviating factors.  No treatments prior to arrival.  No diarrhea or known constipation.  Patient reports pain is a 7/10 and is sharp in nature.  Patient accompanied by his mother to the emergency department.  She reports that he does not normally complain of pain like this.  The history is provided by the patient, a parent and medical records. No language interpreter was used.      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 No pertinent surgical  history.     Family History  Problem Relation Age of Onset   Mental illness Brother     Social History   Tobacco Use   Smoking status: Never   Smokeless tobacco: Never  Substance Use Topics   Alcohol use: Never   Drug use: Never    Home Medications Prior to Admission medications   Medication Sig Start Date End Date Taking? Authorizing Provider  pantoprazole (PROTONIX) 20 MG tablet Take 1 tablet (20 mg total) by mouth daily. 04/17/21  Yes Eisha Chatterjee, Jarrett Soho, PA-C  benztropine (COGENTIN) 0.5 MG tablet Take 0.5 mg by mouth every 6 (six) hours as needed for tremors.    [provider]  doxepin (SINEQUAN) 10 MG capsule Take 10 mg by mouth at bedtime.    [provider]  gabapentin (NEURONTIN) 300 MG capsule Take 300 mg by mouth at bedtime.    [provider]  hydrOXYzine (ATARAX/VISTARIL) 25 MG tablet Take 25 mg by mouth every 6 (six) hours as needed for anxiety.    [provider]  metFORMIN (GLUCOPHAGE) 500 MG tablet Take 1 tablet (500 mg total) by mouth 2 (two) times daily with a meal. :Medication induced wt management 01/05/21   Lindell Spar I, NP  polyethylene glycol (MIRALAX / GLYCOLAX) 17 g packet Take 17 g by mouth daily.    [provider]  propranolol (INDERAL) 40 MG tablet Take 40 mg by mouth every 8 (eight) hours as needed (For anxiety).  [provider]  risperiDONE (RISPERDAL) 2 MG tablet Take 2 mg by mouth 2 (two) times daily.    [provider]    Allergies    Patient has no known allergies.  Review of Systems   Review of Systems  Constitutional:  Negative for appetite change, diaphoresis, fatigue, fever and unexpected weight change.  HENT:  Negative for mouth sores.   Eyes:  Negative for visual disturbance.  Respiratory:  Negative for cough, chest tightness, shortness of breath and wheezing.   Cardiovascular:  Negative for chest pain.  Gastrointestinal:  Positive for abdominal pain. Negative for  constipation, diarrhea, nausea and vomiting.  Endocrine: Negative for polydipsia, polyphagia and polyuria.  Genitourinary:  Positive for flank pain. Negative for dysuria, frequency, hematuria and urgency.  Musculoskeletal:  Positive for back pain. Negative for neck stiffness.  Skin:  Negative for rash.  Allergic/Immunologic: Negative for immunocompromised state.  Neurological:  Negative for syncope, light-headedness and headaches.  Hematological:  Does not bruise/bleed easily.  Psychiatric/Behavioral:  Negative for sleep disturbance. The patient is not nervous/anxious.    Physical Exam Updated Vital Signs BP 118/87    Pulse 99    Temp 98.5 F (36.9 C) (Oral)    Resp 18    SpO2 98%   Physical Exam Vitals and nursing note reviewed.  Constitutional:      General: He is not in acute distress.    Appearance: He is not diaphoretic.  HENT:     Head: Normocephalic.  Eyes:     General: No scleral icterus.    Conjunctiva/sclera: Conjunctivae normal.  Cardiovascular:     Rate and Rhythm: Normal rate and regular rhythm.     Pulses: Normal pulses.          Radial pulses are 2+ on the right side and 2+ on the left side.  Pulmonary:     Effort: Pulmonary effort is normal. No tachypnea, accessory muscle usage, prolonged expiration, respiratory distress or retractions.     Breath sounds: Normal breath sounds. No stridor.     Comments: Equal chest rise. No increased work of breathing. Abdominal:     General: Bowel sounds are normal. There is no distension.     Palpations: Abdomen is soft.     Tenderness: There is abdominal tenderness in the right upper quadrant. There is no right CVA tenderness, left CVA tenderness or rebound. Positive signs include Murphy's sign.  Musculoskeletal:     Cervical back: Normal range of motion.     Comments: Moves all extremities equally and without difficulty.  Skin:    General: Skin is warm and dry.     Capillary Refill: Capillary refill takes less than 2  seconds.  Neurological:     Mental Status: He is alert.     GCS: GCS eye subscore is 4. GCS verbal subscore is 5. GCS motor subscore is 6.     Comments: Speech is clear and goal oriented.  Psychiatric:        Mood and Affect: Mood normal.    ED Results / Procedures / Treatments   Labs (all labs ordered are listed, but only abnormal results are displayed) Labs Reviewed  COMPREHENSIVE METABOLIC PANEL - Abnormal; Notable for the following components:      Result Value   Total Protein 8.4 (*)    AST 44 (*)    All other components within normal limits  URINALYSIS, ROUTINE W REFLEX MICROSCOPIC - Abnormal; Notable for the following components:   Color, Urine  YELLOW (*)    APPearance CLEAR (*)    Hgb urine dipstick SMALL (*)    Bacteria, UA RARE (*)    Non Squamous Epithelial 0-5 (*)    All other components within normal limits  URINE CULTURE  CBC WITH DIFFERENTIAL/PLATELET  LIPASE, BLOOD    EKG None  Radiology DG Chest 2 View  Result Date: 04/16/2021 CLINICAL DATA:  Right upper quadrant pain and right back pain for 24 hours. EXAM: CHEST - 2 VIEW COMPARISON:  January 27, 2021 FINDINGS: The heart size and mediastinal contours are within normal limits. Decreased lung volumes are noted. Both lungs are clear. The visualized skeletal structures are unremarkable. IMPRESSION: No active cardiopulmonary disease. Electronically Signed   By: Virgina Norfolk M.D.   On: 04/16/2021 23:23   US Abdomen Limited  Result Date: 04/17/2021 CLINICAL DATA:  Right upper quadrant pain for 1 day EXAM: ULTRASOUND ABDOMEN LIMITED RIGHT UPPER QUADRANT COMPARISON:  CT from earlier in the same day. FINDINGS: Gallbladder: No gallstones or wall thickening visualized. No sonographic Murphy sign noted by sonographer. Common bile duct: Diameter: 4.3 mm. Liver: No focal lesion identified. Within normal limits in parenchymal echogenicity. Portal vein is patent on color Doppler imaging with normal direction of blood  flow towards the liver. Other: None. IMPRESSION: Unremarkable right upper quadrant ultrasound. Electronically Signed   By: Inez Catalina M.D.   On: 04/17/2021 00:07   CT Renal Stone Study  Result Date: 04/16/2021 CLINICAL DATA:  Right flank pain.  Pain is worse with urination. EXAM: CT ABDOMEN AND PELVIS WITHOUT CONTRAST TECHNIQUE: Multidetector CT imaging of the abdomen and pelvis was performed following the standard protocol without IV contrast. COMPARISON:  CT chest 01/27/2021 FINDINGS: Lower chest: Lung bases are clear. Hepatobiliary: No focal liver abnormality is seen. No gallstones, gallbladder wall thickening, or biliary dilatation. Pancreas: Unremarkable. No pancreatic ductal dilatation or surrounding inflammatory changes. Spleen: Normal in size without focal abnormality. Adrenals/Urinary Tract: Adrenal glands are unremarkable. Kidneys are normal, without renal calculi, focal lesion, or hydronephrosis. Bladder is decompressed, limiting evaluation for wall thickness. No stones are identified. Stomach/Bowel: Stomach, small bowel, and colon are mostly decompressed. Scattered stool in the colon. No wall thickening or inflammatory changes appreciated. Appendix is not identified. Vascular/Lymphatic: No significant vascular findings are present. No enlarged abdominal or pelvic lymph nodes. Reproductive: Prostate is unremarkable. Other: No abdominal wall hernia or abnormality. No abdominopelvic ascites. Musculoskeletal: No acute or significant osseous findings. IMPRESSION: No renal or ureteral stone or obstruction identified. Electronically Signed   By: Lucienne Capers M.D.   On: 04/16/2021 21:32    Procedures Procedures   Medications Ordered in ED Medications  oxyCODONE-acetaminophen (PERCOCET/ROXICET) 5-325 MG per tablet 2 tablet (2 tablets Oral Given 04/17/21 0021)    ED Course  I have reviewed the triage vital signs and the nursing notes.  Pertinent labs & imaging results that were available  during my care of the patient were reviewed by me and considered in my medical decision making (see chart for details).    MDM Rules/Calculators/A&P                          Patient presents to the emergency department with right upper quadrant and right flank pain.  Patient seen for MSE earlier in the evening.  At that time he had more flank pain and right CVA tenderness.  CT renal without evidence of renal ureteral stone.  Gallbladder did appear normal on that  CT.  Labs are overall reassuring.  Minimal elevation in AST at 44.  No leukocytosis.  Urinalysis with small amount of hemoglobin and rare bacteria.  Urine culture is processing.  Given persistent right upper quadrant abdominal pain and positive Murphy sign on my exam will obtain ultrasound and chest x-ray.  12:35 AM Chest x-ray without evidence of pneumonia, pulmonary edema or pneumothorax.  Patient without pleuritic pain.  No free air under the diaphragm.  Ultrasound without evidence of cholecystitis.  Patient pain improved with medication.  Will give Protonix and have patient follow-up with primary care and gastroenterology.  Discussed with patient and mother who state understanding and are in agreement with the plan. .     Final Clinical Impression(s) / ED Diagnoses Final diagnoses:  RUQ abdominal pain    Rx / DC Orders ED Discharge Orders          Ordered    pantoprazole (PROTONIX) 20 MG tablet  Daily        04/17/21 0028             Kalin Amrhein, Dahlia Client, PA-C 04/17/21 0036    Sloan Leiter, DO 04/19/21 [phone]

## 2021-04-16 NOTE — ED Provider Notes (Signed)
Emergency Medicine Provider Triage Evaluation Note  Noah Ramsey , a 25 y.o. male  was evaluated in triage.  Pt complains of pain to right back/flank.  States that pain started this morning upon waking up.  Pain is worse when moving from sitting to standing position.  Patient also reports that pain is worse with urinating.  Denies any recent falls or heavy lifting.  Review of Systems  Positive: Back pain, flank pain Negative: Fever, chills, dysuria, hematuria, urinary urgency, decreased urinary output, numbness, weakness, saddle anesthesia,  Physical Exam  BP 118/87    Pulse 99    Temp 98.5 F (36.9 C) (Oral)    Resp 18    SpO2 98%  Gen:   Awake, no distress   Resp:  Normal effort  MSK:   Moves extremities without difficulty  Other:  Abdomen soft, nondistended, nontender.  No guarding or rebound tenderness.  Positive right CVA tenderness.  Medical Decision Making  Medically screening exam initiated at 7:10 PM.  Appropriate orders placed.  Noah Ramsey was informed that the remainder of the evaluation will be completed by another provider, this initial triage assessment does not replace that evaluation, and the importance of remaining in the ED until their evaluation is complete.     Haskel Schroeder, PA-C 04/16/21 1911    Sloan Leiter, DO 04/19/21 [phone]

## 2021-04-17 DIAGNOSIS — R1011 Right upper quadrant pain: Secondary | ICD-10-CM | POA: Diagnosis not present

## 2021-04-17 MED ORDER — OXYCODONE-ACETAMINOPHEN 5-325 MG PO TABS
2.0000 | ORAL_TABLET | Freq: Once | ORAL | Status: AC
  Administered 2021-04-17: 2 via ORAL
  Filled 2021-04-17: qty 2

## 2021-04-17 MED ORDER — PANTOPRAZOLE SODIUM 20 MG PO TBEC: 20.0000 mg | DELAYED_RELEASE_TABLET | Freq: Every day | ORAL | 0 refills | Status: DC

## 2021-04-17 NOTE — Discharge Instructions (Addendum)
1. Medications: protonix; usual home medications 2. Treatment: rest, drink plenty of fluids, advance diet slowly 3. Follow Up: Please followup with your primary doctor in 2 days for discussion of your diagnoses and further evaluation after today's visit; if you do not have a primary care doctor use the resource guide provided to find one; Please return to the ER for persistent vomiting, high fevers or worsening symptoms

## 2021-04-18 ENCOUNTER — Telehealth: Payer: Self-pay

## 2021-04-18 LAB — URINE CULTURE: Culture: 10000 — AB

## 2021-04-18 NOTE — Telephone Encounter (Signed)
Transition Care Management Follow-up Telephone Call Date of discharge and from where: 04/17/2021 from [facility] How have you been since you were released from the hospital? Pt stated that he is feeling better and did not have any questions or concerns at this time.  Any questions or concerns? No  Items Reviewed: Did the pt receive and understand the discharge instructions provided? Yes  Medications obtained and verified? Yes  Other? No  Any new allergies since your discharge? No  Dietary orders reviewed? No Do you have support at home? Yes   Functional Questionnaire: (I = Independent and D = Dependent) ADLs: I  Bathing/Dressing- I  Meal Prep- I  Eating- I  Maintaining continence- I  Transferring/Ambulation- I  Managing Meds- I   Follow up appointments reviewed:  PCP Hospital f/u appt confirmed? Yes  Scheduled to see Edwina Barth, MD on 04/25/2021 @ 1:00pm. Specialist Hospital f/u appt confirmed? No   Are transportation arrangements needed? No  If their condition worsens, is the pt aware to call PCP or go to the Emergency Dept.? Yes Was the patient provided with contact information for the PCP's office or ED? Yes Was to pt encouraged to call back with questions or concerns? Yes

## 2021-04-19 ENCOUNTER — Telehealth: Payer: Self-pay

## 2021-04-19 NOTE — Telephone Encounter (Signed)
Post ED Visit - Positive Culture Follow-up  Culture report reviewed by antimicrobial stewardship pharmacist: [facility] []  , Pharm.D. []  Enzo Bi, Pharm.D., BCPS AQ-ID []  , Pharm.D., BCPS []  Celedonio Miyamoto, .D., BCPS []  [place], .D., BCPS, AAHIVP []  Georgina Pillion, Pharm.D., BCPS, AAHIVP []  [address], PharmD, BCPS []  , PharmD, BCPS []  [place], PharmD, BCPS []  [address], PharmD []  , PharmD, BCPS []  Estella Husk, PharmD  Pharmacy Team []  Lysle Pearl, PharmD []  , PharmD []  Phillips Climes, PharmD []  , Rph []  Agapito Games) , PharmD []  Verlan Friends, PharmD []  , PharmD []  Mervyn Gay, PharmD []  , PharmD [x]  Vinnie Level, PharmD []  [facility], PharmD []  , PharmD []  Len Childs, PharmD   Positive urine culture No treatment and no further patient follow-up is required at this time.  04/19/2021, 1:38 PM

## 2021-04-24 DIAGNOSIS — R6889 Other general symptoms and signs: Secondary | ICD-10-CM | POA: Diagnosis not present

## 2021-04-25 ENCOUNTER — Ambulatory Visit: Payer: Medicare Other | Admitting: Emergency Medicine

## 2021-04-27 DIAGNOSIS — H18713 Corneal ectasia, bilateral: Secondary | ICD-10-CM | POA: Diagnosis not present

## 2021-04-27 DIAGNOSIS — H52223 Regular astigmatism, bilateral: Secondary | ICD-10-CM | POA: Diagnosis not present

## 2021-04-27 DIAGNOSIS — H5213 Myopia, bilateral: Secondary | ICD-10-CM | POA: Diagnosis not present

## 2021-05-31 ENCOUNTER — Other Ambulatory Visit: Payer: Self-pay

## 2021-05-31 ENCOUNTER — Encounter: Payer: Self-pay | Admitting: Emergency Medicine

## 2021-05-31 ENCOUNTER — Ambulatory Visit (INDEPENDENT_AMBULATORY_CARE_PROVIDER_SITE_OTHER): Payer: Medicare Other | Admitting: Emergency Medicine

## 2021-05-31 VITALS — BP 120/70 | HR 100 | Temp 98.0°F | Ht 67.0 in | Wt 345.0 lb

## 2021-05-31 DIAGNOSIS — F202 Catatonic schizophrenia: Secondary | ICD-10-CM

## 2021-05-31 DIAGNOSIS — R7303 Prediabetes: Secondary | ICD-10-CM

## 2021-05-31 LAB — POCT GLYCOSYLATED HEMOGLOBIN (HGB A1C): Hemoglobin A1C: 5.7 % — AB (ref 4.0–5.6)

## 2021-05-31 MED ORDER — OZEMPIC (0.25 OR 0.5 MG/DOSE) 2 MG/1.5ML ~~LOC~~ SOPN: 0.5000 mg | PEN_INJECTOR | SUBCUTANEOUS | 5 refills | Status: DC

## 2021-05-31 NOTE — Assessment & Plan Note (Addendum)
Referred to medical weight management clinic. May benefit from weekly Ozempic.  We will start with 0.5 mg weekly. Continue metformin 1000 mg twice a day. Diet and nutrition discussed.

## 2021-05-31 NOTE — Assessment & Plan Note (Signed)
Presently on metformin 1000 mg twice a day. May benefit from Ozempic 0.5 mg weekly. Diet and nutrition discussed. Lab Results  Component Value Date   HGBA1C 5.7 (A) 05/31/2021

## 2021-05-31 NOTE — Patient Instructions (Signed)
Prediabetes Eating Plan °Prediabetes is a condition that causes blood sugar (glucose) levels to be higher than normal. This increases the risk for developing type 2 diabetes (type 2 diabetes mellitus). Working with a health care provider or nutrition specialist (dietitian) to make diet and lifestyle changes can help prevent the onset of diabetes. These changes may help you: °Control your blood glucose levels. °Improve your cholesterol levels. °Manage your blood pressure. °What are tips for following this plan? °Reading food labels °Read food labels to check the amount of fat, salt (sodium), and sugar in prepackaged foods. Avoid foods that have: °Saturated fats. °Trans fats. °Added sugars. °Avoid foods that have more than 300 milligrams (mg) of sodium per serving. Limit your sodium intake to less than 2,300 mg each day. °Shopping °Avoid buying pre-made and processed foods. °Avoid buying drinks with added sugar. °Cooking °Cook with olive oil. Do not use butter, lard, or ghee. °Bake, broil, grill, steam, or boil foods. Avoid frying. °Meal planning ° °Work with your dietitian to create an eating plan that is right for you. This may include tracking how many calories you take in each day. Use a food diary, notebook, or mobile application to track what you eat at each meal. °Consider following a Mediterranean diet. This includes: °Eating several servings of fresh fruits and vegetables each day. °Eating fish at least twice a week. °Eating one serving each day of whole grains, beans, nuts, and seeds. °Using olive oil instead of other fats. °Limiting alcohol. °Limiting red meat. °Using nonfat or low-fat dairy products. °Consider following a plant-based diet. This includes dietary choices that focus on eating mostly vegetables and fruit, grains, beans, nuts, and seeds. °If you have high blood pressure, you may need to limit your sodium intake or follow a diet such as the DASH (Dietary Approaches to Stop Hypertension) eating  plan. The DASH diet aims to lower high blood pressure. °Lifestyle °Set weight loss goals with help from your health care team. It is recommended that most people with prediabetes lose 7% of their body weight. °Exercise for at least 30 minutes 5 or more days a week. °Attend a support group or seek support from a mental health counselor. °Take over-the-counter and prescription medicines only as told by your health care provider. °What foods are recommended? °Fruits °Berries. Bananas. Apples. Oranges. Grapes. Papaya. Mango. Pomegranate. Kiwi. Grapefruit. Cherries. °Vegetables °Lettuce. Spinach. Peas. Beets. Cauliflower. Cabbage. Broccoli. Carrots. Tomatoes. Squash. Eggplant. Herbs. Peppers. Onions. Cucumbers. Brussels sprouts. °Grains °Whole grains, such as whole-wheat or whole-grain breads, crackers, cereals, and pasta. Unsweetened oatmeal. Bulgur. Barley. Quinoa. Brown rice. Corn or whole-wheat flour tortillas or taco shells. °Meats and other proteins °Seafood. Poultry without skin. Lean cuts of pork and beef. Tofu. Eggs. Nuts. Beans. °Dairy °Low-fat or fat-free dairy products, such as yogurt, cottage cheese, and cheese. °Beverages °Water. Tea. Coffee. Sugar-free or diet soda. Seltzer water. Low-fat or nonfat milk. Milk alternatives, such as soy or almond milk. °Fats and oils °Olive oil. Canola oil. Sunflower oil. Grapeseed oil. Avocado. Walnuts. °Sweets and desserts °Sugar-free or low-fat pudding. Sugar-free or low-fat ice cream and other frozen treats. °Seasonings and condiments °Herbs. Sodium-free spices. Mustard. Relish. Low-salt, low-sugar ketchup. Low-salt, low-sugar barbecue sauce. Low-fat or fat-free mayonnaise. °The items listed above may not be a complete list of recommended foods and beverages. Contact a dietitian for more information. °What foods are not recommended? °Fruits °Fruits canned with syrup. °Vegetables °Canned vegetables. Frozen vegetables with butter or cream sauce. °Grains °Refined white  flour and flour   products, such as bread, pasta, snack foods, and cereals. °Meats and other proteins °Fatty cuts of meat. Poultry with skin. Breaded or fried meat. Processed meats. °Dairy °Full-fat yogurt, cheese, or milk. °Beverages °Sweetened drinks, such as iced tea and soda. °Fats and oils °Butter. Lard. Ghee. °Sweets and desserts °Baked goods, such as cake, cupcakes, pastries, cookies, and cheesecake. °Seasonings and condiments °Spice mixes with added salt. Ketchup. Barbecue sauce. Mayonnaise. °The items listed above may not be a complete list of foods and beverages that are not recommended. Contact a dietitian for more information. °Where to find more information °American Diabetes Association: www.diabetes.org °Summary °You may need to make diet and lifestyle changes to help prevent the onset of diabetes. These changes can help you control blood sugar, improve cholesterol levels, and manage blood pressure. °Set weight loss goals with help from your health care team. It is recommended that most people with prediabetes lose 7% of their body weight. °Consider following a Mediterranean diet. This includes eating plenty of fresh fruits and vegetables, whole grains, beans, nuts, seeds, fish, and low-fat dairy, and using olive oil instead of other fats. °This information is not intended to replace advice given to you by your health care provider. Make sure you discuss any questions you have with your health care provider. °Document Revised: 07/08/2019 Document Reviewed: 07/08/2019 °Elsevier Patient Education © 2022 [employer] ° °

## 2021-05-31 NOTE — Progress Notes (Signed)
Noah Ramsey 26 y.o.   Chief Complaint  Patient presents with   Diabetes    Pt would like to discuss another medication for diabetes   Obesity    Pt states that he has gained weight, believes it is due to anti pys medication.    HISTORY OF PRESENT ILLNESS: This is a 26 y.o. male here for evaluation of diabetes.  Recently started on metformin. Has been gaining weight. Past medical history includes psychiatric condition with episodes of psychosis. Stable.  No other complaints or medical concerns today. Lab Results  Component Value Date   HGBA1C 5.5 12/04/2020     HPI   Prior to Admission medications   Medication Sig Start Date End Date Taking? Authorizing Provider  benztropine (COGENTIN) 0.5 MG tablet Take 0.5 mg by mouth every 6 (six) hours as needed for tremors.   Yes [provider]  hydrOXYzine (ATARAX/VISTARIL) 25 MG tablet Take 25 mg by mouth every 6 (six) hours as needed for anxiety.   Yes [provider]  INVEGA SUSTENNA 156 MG/ML SUSY injection Inject into the muscle. 03/09/21  Yes [provider]  metFORMIN (GLUCOPHAGE) 500 MG tablet Take 1 tablet (500 mg total) by mouth 2 (two) times daily with a meal. 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 No pertinent surgical history.  Social History   Socioeconomic History   Marital status: Single    Spouse name: Not on file   Number of children: Not on file   Years of  education: Not on file   Highest education level: Not on file  Occupational History   Not on file  Tobacco Use   Smoking status: Never   Smokeless tobacco: Never  Substance and Sexual Activity   Alcohol use: Never   Drug use: Never   Sexual activity: Not on file  Other Topics Concern   Not on file  Social History Narrative   Not on file   Social Determinants of Health   Financial Resource Strain: Not on file  Food Insecurity: Not on file  Transportation Needs: Not on file  Physical Activity: Not on file  Stress: Not on file  Social Connections: Not on file  Intimate Partner Violence: Not on file    Family History  Problem Relation Age of Onset   Mental illness Brother      Review of Systems  Constitutional: Negative.  Negative for chills and fever.  HENT: Negative.  Negative for congestion and sore throat.   Respiratory: Negative.  Negative for cough and shortness of breath.   Cardiovascular: Negative.  Negative for chest pain and palpitations.  Gastrointestinal: Negative.  Negative for abdominal pain, diarrhea, nausea and vomiting.  Genitourinary: Negative.  Negative for dysuria and hematuria.  Skin: Negative.  Negative for rash.  Neurological:  Negative for dizziness and headaches.  All other systems reviewed and are negative.   Physical Exam Vitals reviewed.  Constitutional:      Appearance:  Normal appearance. He is obese.  HENT:     Head: Normocephalic.  Eyes:     Extraocular Movements: Extraocular movements intact.     Conjunctiva/sclera: Conjunctivae normal.     Pupils: Pupils are equal, round, and reactive to light.  Cardiovascular:     Rate and Rhythm: Normal rate and regular rhythm.     Pulses: Normal pulses.     Heart sounds: Normal heart sounds.  Pulmonary:     Effort: Pulmonary effort is normal.     Breath sounds: Normal breath sounds.  Musculoskeletal:        General: Normal range of motion.     Cervical back: No tenderness.   Lymphadenopathy:     Cervical: No cervical adenopathy.  Skin:    General: Skin is warm and dry.     Capillary Refill: Capillary refill takes less than 2 seconds.  Neurological:     General: No focal deficit present.     Mental Status: He is alert and oriented to person, place, and time.  Psychiatric:        Mood and Affect: Mood normal.        Behavior: Behavior normal.    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      Prediabetes - Primary    Presently on metformin 1000 mg twice a day. May benefit from Ozempic 0.5 mg weekly. Diet and nutrition discussed. Lab Results  Component Value Date   HGBA1C 5.7 (A) 05/31/2021         Relevant Medications   Semaglutide,0.25 or 0.5MG /DOS, (OZEMPIC, 0.25 OR 0.5 MG/DOSE,) 2 MG/1.5ML SOPN   Other Relevant Orders   POCT glycosylated hemoglobin (Hb A1C) (Completed)   Morbid obesity ([name])    Referred to medical weight management clinic. May benefit from weekly Ozempic.  We will start with 0.5 mg weekly. Continue metformin 1000 mg twice a day. Diet and nutrition discussed.      Relevant Medications   Semaglutide,0.25 or 0.5MG /DOS, (OZEMPIC, 0.25 OR 0.5 MG/DOSE,) 2 MG/1.5ML SOPN   Other Relevant Orders   Amb Ref to Medical Weight Management   Patient Instructions  Prediabetes Eating Plan Prediabetes is a condition that causes blood sugar (glucose) levels to be higher than normal. This increases the risk for developing  type 2 diabetes (type 2 diabetes mellitus). Working with a health care provider or nutrition specialist (dietitian) to make diet and lifestyle changes can help prevent the onset of diabetes. These changes may help you: Control your blood glucose levels. Improve your cholesterol levels. Manage your blood pressure. What are tips for following this plan? Reading food labels Read food labels to check the amount of fat, salt (sodium), and sugar in prepackaged foods. Avoid foods that have: Saturated fats. Trans fats. Added sugars. Avoid foods that have more than 300 milligrams (mg) of sodium per serving. Limit  your sodium intake to less than 2,300 mg each day. Shopping Avoid buying pre-made and processed foods. Avoid buying drinks with added sugar. Cooking Cook with olive oil. Do not use butter, lard, or ghee. Bake, broil, grill, steam, or boil foods. Avoid frying. Meal planning  Work with your dietitian to create an eating plan that is right for you. This may include tracking how many calories you take in each day. Use a food diary, notebook, or mobile application to track what you eat at each meal. Consider following a Mediterranean diet. This includes: Eating several servings of fresh fruits and vegetables each day. Eating fish at least twice a week. Eating one serving each day of whole grains, beans, nuts, and seeds. Using olive oil instead of other fats. Limiting alcohol. Limiting red meat. Using nonfat or low-fat dairy products. Consider following a plant-based diet. This includes dietary choices that focus on eating mostly vegetables and fruit, grains, beans, nuts, and seeds. If you have high blood pressure, you may need to limit your sodium intake or follow a diet such as the DASH (Dietary Approaches to Stop Hypertension) eating plan. The DASH diet aims to lower high blood pressure. Lifestyle Set weight loss goals with help from your health care team. It is recommended that most  people with prediabetes lose 7% of their body weight. Exercise for at least 30 minutes 5 or more days a week. Attend a support group or seek support from a mental health counselor. Take over-the-counter and prescription medicines only as told by your health care provider. What foods are recommended? Fruits Berries. Bananas. Apples. Oranges. Grapes. Papaya. Mango. Pomegranate. Kiwi. Grapefruit. Cherries. Vegetables Lettuce. Spinach. Peas. Beets. Cauliflower. Cabbage. Broccoli. Carrots. Tomatoes. Squash. Eggplant. Herbs. Peppers. Onions. Cucumbers. Brussels sprouts. Grains Whole grains, such as whole-wheat or whole-grain breads, crackers, cereals, and pasta. Unsweetened oatmeal. Bulgur. Barley. Quinoa. Brown rice. Corn or whole-wheat flour tortillas or taco shells. Meats and other proteins Seafood. Poultry without skin. Lean cuts of pork and beef. Tofu. Eggs. Nuts. Beans. Dairy Low-fat or fat-free dairy products, such as yogurt, cottage cheese, and cheese. Beverages Water. Tea. Coffee. Sugar-free or diet soda. Seltzer water. Low-fat or nonfat milk. Milk alternatives, such as soy or almond milk. Fats and oils Olive oil. Canola oil. Sunflower oil. Grapeseed oil. Avocado. Walnuts. Sweets and desserts Sugar-free or low-fat pudding. Sugar-free or low-fat ice cream and other frozen treats. Seasonings and condiments Herbs. Sodium-free spices. Mustard. Relish. Low-salt, low-sugar ketchup. Low-salt, low-sugar barbecue sauce. Low-fat or fat-free mayonnaise. The items listed above may not be a complete list of recommended foods and beverages. Contact a dietitian for more information. What foods are not recommended? Fruits Fruits canned with syrup. Vegetables Canned vegetables. Frozen vegetables with butter or cream sauce. Grains Refined white flour and flour products, such as bread, pasta, snack foods, and cereals. Meats and other proteins Fatty cuts of meat. Poultry with skin. Breaded or fried  meat. Processed meats. Dairy Full-fat yogurt, cheese, or milk. Beverages Sweetened drinks, such as iced tea and soda. Fats and oils Butter. Lard. Ghee. Sweets and desserts Baked goods, such as cake, cupcakes, pastries, cookies, and cheesecake. Seasonings and condiments Spice mixes with added salt. Ketchup. Barbecue sauce. Mayonnaise. The items listed above may not be a complete list of foods and beverages that are not recommended. Contact a dietitian for more information. Where to find more information American Diabetes Association: www.diabetes.org Summary You may need to make diet and lifestyle changes to help prevent the onset of diabetes.  These changes can help you control blood sugar, improve cholesterol levels, and manage blood pressure. Set weight loss goals with help from your health care team. It is recommended that most people with prediabetes lose 7% of their body weight. Consider following a Mediterranean diet. This includes eating plenty of fresh fruits and vegetables, whole grains, beans, nuts, seeds, fish, and low-fat dairy, and using olive oil instead of other fats. This information is not intended to replace advice given to you by your health care provider. Make sure you discuss any questions you have with your health care provider. Document Revised: 07/08/2019 Document Reviewed: 07/08/2019 Elsevier Patient Education  2022 [place], MD [facility]

## 2021-05-31 NOTE — Assessment & Plan Note (Signed)
Stable, on medications, managed by psychiatrist who he sees on a monthly basis.

## 2021-06-11 DIAGNOSIS — H18621 Keratoconus, unstable, right eye: Secondary | ICD-10-CM | POA: Diagnosis not present

## 2021-06-11 DIAGNOSIS — H18622 Keratoconus, unstable, left eye: Secondary | ICD-10-CM | POA: Diagnosis not present

## 2021-08-28 ENCOUNTER — Encounter (HOSPITAL_COMMUNITY): Payer: Self-pay

## 2021-08-28 ENCOUNTER — Emergency Department (HOSPITAL_COMMUNITY)
Admission: EM | Admit: 2021-08-28 | Discharge: 2021-08-29 | Disposition: A | Discharge status: Home / Self Care | Payer: Medicare Other | Attending: Student | Admitting: Student

## 2021-08-28 ENCOUNTER — Other Ambulatory Visit: Payer: Self-pay

## 2021-08-28 DIAGNOSIS — F209 Schizophrenia, unspecified: Secondary | ICD-10-CM | POA: Diagnosis present

## 2021-08-28 DIAGNOSIS — F259 Schizoaffective disorder, unspecified: Secondary | ICD-10-CM | POA: Diagnosis not present

## 2021-08-28 DIAGNOSIS — R45851 Suicidal ideations: Secondary | ICD-10-CM | POA: Insufficient documentation

## 2021-08-28 DIAGNOSIS — Z20822 Contact with and (suspected) exposure to covid-19: Secondary | ICD-10-CM | POA: Diagnosis not present

## 2021-08-28 DIAGNOSIS — R440 Auditory hallucinations: Secondary | ICD-10-CM | POA: Diagnosis not present

## 2021-08-28 DIAGNOSIS — R000 Tachycardia, unspecified: Secondary | ICD-10-CM | POA: Diagnosis not present

## 2021-08-28 LAB — CBC WITH DIFFERENTIAL/PLATELET
Abs Immature Granulocytes: 0.01 10*3/uL (ref 0.00–0.07)
Basophils Absolute: 0 10*3/uL (ref 0.0–0.1)
Basophils Relative: 0 %
Eosinophils Absolute: 0.1 10*3/uL (ref 0.0–0.5)
Eosinophils Relative: 2 %
HCT: 40.3 % (ref 39.0–52.0)
Hemoglobin: 13.8 g/dL (ref 13.0–17.0)
Immature Granulocytes: 0 %
Lymphocytes Relative: 40 %
Lymphs Abs: 2.2 10*3/uL (ref 0.7–4.0)
MCH: 31.9 pg (ref 26.0–34.0)
MCHC: 34.2 g/dL (ref 30.0–36.0)
MCV: 93.1 fL (ref 80.0–100.0)
Monocytes Absolute: 0.6 10*3/uL (ref 0.1–1.0)
Monocytes Relative: 11 %
Neutro Abs: 2.6 10*3/uL (ref 1.7–7.7)
Neutrophils Relative %: 47 %
Platelets: 228 10*3/uL (ref 150–400)
RBC: 4.33 MIL/uL (ref 4.22–5.81)
RDW: 13.4 % (ref 11.5–15.5)
WBC: 5.6 10*3/uL (ref 4.0–10.5)
nRBC: 0 % (ref 0.0–0.2)

## 2021-08-28 LAB — SALICYLATE LEVEL: Salicylate Lvl: <7 mg/dL — ABNORMAL LOW (ref 7.0–30.0)

## 2021-08-28 LAB — COMPREHENSIVE METABOLIC PANEL
ALT: 34 U/L (ref 0–44)
AST: 43 U/L — ABNORMAL HIGH (ref 15–41)
Albumin: 3.9 g/dL (ref 3.5–5.0)
Alkaline Phosphatase: 57 U/L (ref 38–126)
Anion gap: 10 (ref 5–15)
BUN: 15 mg/dL (ref 6–20)
CO2: 26 mmol/L (ref 22–32)
Calcium: 9.6 mg/dL (ref 8.9–10.3)
Chloride: 103 mmol/L (ref 98–111)
Creatinine, Ser: 1.1 mg/dL (ref 0.61–1.24)
GFR, Estimated: >60 mL/min (ref >60)
Glucose, Bld: 89 mg/dL (ref 70–99)
Potassium: 3.9 mmol/L (ref 3.5–5.1)
Sodium: 139 mmol/L (ref 135–145)
Total Bilirubin: 0.6 mg/dL (ref 0.3–1.2)
Total Protein: 7.8 g/dL (ref 6.5–8.1)

## 2021-08-28 LAB — RAPID URINE DRUG SCREEN, HOSP PERFORMED
Amphetamines: NOT DETECTED
Barbiturates: NOT DETECTED
Benzodiazepines: NOT DETECTED
Cocaine: NOT DETECTED
Opiates: NOT DETECTED
Tetrahydrocannabinol: NOT DETECTED

## 2021-08-28 LAB — ACETAMINOPHEN LEVEL: Acetaminophen (Tylenol), Serum: <10 ug/mL — ABNORMAL LOW (ref 10–30)

## 2021-08-28 LAB — ETHANOL: Alcohol, Ethyl (B): <10 mg/dL (ref <10)

## 2021-08-28 LAB — RESP PANEL BY RT-PCR (FLU A&B, COVID) ARPGX2
Influenza A by PCR: NEGATIVE
Influenza B by PCR: NEGATIVE
SARS Coronavirus 2 by RT PCR: NEGATIVE

## 2021-08-28 MED ORDER — ACETAMINOPHEN 500 MG PO TABS
1000.0000 mg | ORAL_TABLET | Freq: Four times a day (QID) | ORAL | Status: DC | PRN
  Administered 2021-08-28 – 2021-08-29 (×3): 1000 mg via ORAL
  Filled 2021-08-28 (×3): qty 2

## 2021-08-28 NOTE — BH Assessment (Incomplete)
Patient

## 2021-08-28 NOTE — BH Assessment (Signed)
Comprehensive Clinical Assessment (CCA) Note ? ?08/28/2021 ?Noah Mosshandler Beever ?[id number] ? ?Disposition: Nira ConnJason Berry, PHMNP recommends pt to be observed and reassessed by psychiatry. Disposition discussed with Neomia DearJoshua J. Newton, RN.  ? ?[facility] Row [facility]  ?C-SSRS RISK CATEGORY No Risk No Risk Error: Q3, 4, or 5 should not be populated when Q2 is No  ? ?  ? ?The patient demonstrates the following risk factors for suicide: Chronic risk factors for suicide include: psychiatric disorder of Schizophrenia, unspecified (HCC) . Acute risk factors for suicide include:  Pt reports, hearing 4-5 voices telling him to kill himself . Protective factors for this patient include: positive social support. Considering these factors, the overall suicide risk at this point appears to be high. Patient is appropriate for outpatient follow up. ? ?Noah Ramsey is a 26 year old male who [facility] Clinician asked the pt, "what brought you to the hospital?" Pt reports, last night and this afternoon he heard 4-5  voices telling him to kill himself (slit his wrist), to prevent the voices from worsening he decided to come in and get help. Pt reports, last night he seen a green figure (goblin) with flames through his head. Pt reports, he does not want to kill himself but the voices are telling him to do it. Clinician asked the pt if he feels he will give into the voices, pt replied, he is not sure how to answer the question. Pt reports, he has swords. Pt also denies, HI, self-injurious behaviors.  ? ?Pt's UDS was negative. Pt's is linked to [facility] for medication management. Per chart pt received his Invega shot on Aug 23, 2021. Pt reports, he is also prescribed. Gabapentin, Hydroxyzine and  Risperdal. Pt reports, he takes his medications as prescribed. [facility]  ? ?Pt presents alert, eating with normal speech. Pt's mood, affect was depressed. Pt's insight, judgement was fair. Pt reports, he can not contract for safety because he's scared of himself and he does not want to hurt himself or others.  ? ?Diagnosis: Schizophrenia, unspecified (HCC).  ? ?Chief Complaint:  ?Chief Complaint  ?Patient presents with  ? Psychiatric Evaluation  ? ?Visit Diagnosis:   ? ? ?CCA Screening, Triage and Referral (STR) ? ?Patient Reported Information ?How did you hear about us? Family/Friend ? ?What Is the Reason for Your Visit/Call Today? Per EDP/PA note: "26 year old male with underlying history of schizophrenia presents to the ED with a chief complaint of auditory hallucinations. Patient reports that he has been hearing voices this morning, the voices have been "whispering to him, in order for him to hurt himself ". He was also told by these voices "take a knife and stab yourself in the neck ". He does have a prior history of suicidal ideations, but no suicidal attempts. He is currently on Invega, receives this injection monthly, last taken this on Aug 23, 2021. Patient is concerned as voices keep telling him to "just hurt himself ". When told about evaluation patient voiced "I think it will be a good idea for you guys to keep me". He denies any chest pain, shortness of breath or abdominal pain. No homicidal ideations, or visual hallucinations." ? ?How Long Has This Been Causing You Problems? > than 6 months ? ?What Do  You Feel Would Help You the Most Today? Medication(s); Treatment for Depression or other mood problem ? ? ?Have You Recently Had Any Thoughts About Hurting Yourself? No (Pt reports, he don't want to kill himself but the voices are telling him to.) ? ?Are You Planning to Commit Suicide/Harm Yourself At This time? No ? ? ?Have you Recently Had  Thoughts About Hurting Someone [name]? No ? ?Are You Planning to Harm Someone at This Time? No ? ?Explanation: No data recorded ? ?Have You Used Any Alcohol or Drugs in the Past 24 Hours? No ? ?How Long Ago Did You Use Drugs or Alcohol? No data recorded ?What Did You Use and How Much? No data recorded ? ?Do You Currently Have a Therapist/Psychiatrist? Yes ? ?Name of Therapist/Psychiatrist: [facility], [facility] Team, medication management. ? ? ?Have You Been Recently Discharged From Any Office Practice or Programs? No ? ?Explanation of Discharge From Practice/Program: NA ? ? ?  ?CCA Screening Triage Referral Assessment ?Type of Contact: Tele-Assessment ? ?Telemedicine Service Delivery:   ?Is this Initial or Reassessment? Initial Assessment ? ?Date Telepsych consult ordered in [facility]:  08/28/21 ? ?Time Telepsych consult ordered in [facility]:  1928 ? ?Location of Assessment: [facility] ED ? ?Provider Location: [facility] Assessment Services ? ? ?Collateral Involvement: n/a ? ? ?Does Patient Have a Automotive engineer Guardian? No data recorded ?Name and Contact of Legal Guardian: No data recorded ?If Minor and Not Living with Parent(s), Who has Custody? NA ? ?Is CPS involved or ever been involved? Never ? ?Is APS involved or ever been involved? Never ? ? ?Patient Determined To Be At Risk for Harm To Self or Others Based on Review of Patient Reported Information or Presenting Complaint? Yes, for Self-Harm ? ?Method: Plan without intent ? ?Availability of Means: Has close by ? ?Intent: Vague intent or NA ? ?Notification Required: No need or identified person ? ?Additional Information for Danger to Others Potential: Active psychosis ? ?Additional Comments for Danger to Others Potential: NA ? ?Are There Guns or Other Weapons in Your Home? No ? ?Types of Guns/Weapons: No data recorded ?Are These Weapons Safely Secured?                            No ? ?Who Could Verify You Are Able To Have These Secured: Pt mom reports no guns in the  house ? ?Do You Have any Outstanding Charges, Pending Court Dates, Parole/Probation? none ? ?Contacted To Inform of Risk of Harm To Self or Others: Other: Comment (NA) ? ? ? ?Does Patient Present under Involuntary Commitment? No ? ?IVC Papers Initial File Date: 02/01/21 ? ? ?[place] of Residence: [place] ? ? ?Patient Currently Receiving the Following Services: ACTT ([facility] Community Treatment) ? ? ?Determination of Need: Urgent (48 hours) ? ? ?Options For Referral: Medication Management; Outpatient Therapy; [facility]; Inpatient Hospitalization ? ? ? ? ?CCA Biopsychosocial ?Patient Reported Schizophrenia/Schizoaffective Diagnosis in Past: Yes ? ? ?Strengths: UTA ? ? ?Mental Health Symptoms ?Depression:   ?Irritability; Hopelessness; Fatigue; Difficulty Concentrating; Sleep (too much or little) (Isolation, frustrated. Pt reports, he blames himself because he give his parents a hard time due to his condition (Schizophrenia.)) ?  ?Duration of Depressive symptoms:    ?Mania:   ?None ?  ?Anxiety:    ?Worrying; Tension ?  ?Psychosis:   ?Hallucinations; Delusions ?  ?Duration of Psychotic symptoms:    ?Trauma:   ?None ?  ?Obsessions:   ?  None ?  ?Compulsions:   ?None ?  ?Inattention:   ?Disorganized; Forgetful; Loses things; Poor follow-through on tasks ?  ?Hyperactivity/Impulsivity:   ?Feeling of restlessness; Fidgets with hands/feet ?  ?Oppositional/Defiant Behaviors:   ?None ?  ?Emotional Irregularity:   ?Recurrent suicidal behaviors/gestures/threats ?  ?Other Mood/Personality Symptoms:   ?None noted ?  ? ?Mental Status Exam ?Appearance and self-care  ?Stature:   ?Tall ?  ?Weight:   ?Overweight ?  ?Clothing:   ?-- (Pt in hospital gown.) ?  ?Grooming:   ?Normal ?  ?Cosmetic use:   ?None ?  ?Posture/gait:   ?Normal ?  ?Motor activity:   ?Not Remarkable ?  ?Sensorium  ?Attention:   ?Normal ?  ?Concentration:   ?Normal ?  ?Orientation:   ?X5 ?  ?Recall/memory:   ?Normal ?  ?Affect and Mood  ?Affect:   ?Depressed ?   ?Mood:   ?Depressed ?  ?Relating  ?Eye contact:   ?Normal ?  ?Facial expression:   ?Depressed ?  ?Attitude toward examiner:   ?Cooperative ?  ?Thought and Language  ?Speech flow:  ?Normal ?  ?Thought content:   ?Appropr

## 2021-08-28 NOTE — BH Assessment (Signed)
Clinician messaged  Colbert Ewing, RN: "Hey. It's Trey with TTS. Is the pt able to engage in the assessment, if so the pt will need to be placed in a private room. Also is the pt under IVC?"  ? ?Clinician awaiting response.  ? ? ? ?Vertell Novak, MS, [facility] ?Triage Specialist ?[phone] ? ?

## 2021-08-28 NOTE — ED Provider Notes (Signed)
?[facility] EMERGENCY DEPARTMENT ?Provider Note ? ? ?CSN: [id number] ?Arrival date & time: 08/28/21  1453 ? ?  ? ?History ? ?Chief Complaint  ?Patient presents with  ? Psychiatric Evaluation  ? ? ?Noah Ramsey is a 26 y.o. male. ? ?26 year old male with underlying history of schizophrenia presents to the ED with a chief complaint of auditory hallucinations.  Patient reports that he has been hearing voices this morning, the voices have been "whispering to him, in order for him to hurt himself ".  He was also told by these voices "take a knife and stab yourself in the neck ".  He does have a prior history of suicidal ideations, but no suicidal attempts.  He is currently on Invega, receives this injection monthly, last taken this on Aug 23, 2021.  Patient is concerned as voices keep telling him to "just hurt himself ". When told about evaluation patient voiced "I think it will be a good idea for you guys to keep me". He denies any chest pain, shortness of breath or abdominal pain.  No homicidal ideations, or visual hallucinations.  ? ?The history is provided by the patient and medical records.  ? ?  ? ?Home Medications ?Prior to Admission medications   ?Medication Sig Start Date End Date Taking? Authorizing Provider  ?benztropine (COGENTIN) 0.5 MG tablet Take 0.5 mg by mouth every 6 (six) hours as needed for tremors.    [provider]  ?hydrOXYzine (ATARAX/VISTARIL) 25 MG tablet Take 25 mg by mouth every 6 (six) hours as needed for anxiety.    [provider]  ?INVEGA SUSTENNA 156 MG/ML SUSY injection Inject into the muscle. 03/09/21   [provider]  ?metFORMIN (GLUCOPHAGE) 500 MG tablet Take 1 tablet (500 mg total) by mouth 2 (two) times daily with a meal. :Medication induced wt management 01/05/21   Sanjuana Kava, NP  ?Semaglutide,0.25 or 0.5MG /DOS, (OZEMPIC, 0.25 OR 0.5 MG/DOSE,) 2 MG/1.5ML SOPN Inject 0.5 mg into the skin once a week. 05/31/21   Georgina Quint, MD  ?   ? ?Allergies    ?Patient has no known allergies.   ? ?Review of Systems   ?Review of Systems  ?Constitutional:  Negative for chills and fever.  ?Respiratory:  Negative for shortness of breath.   ?Cardiovascular:  Negative for chest pain.  ?Gastrointestinal:  Negative for abdominal pain, nausea and vomiting.  ?Neurological:  Negative for dizziness, facial asymmetry and headaches.  ?Psychiatric/Behavioral:  Positive for hallucinations and suicidal ideas. Negative for confusion and self-injury. The patient is not nervous/anxious.   ?All other systems reviewed and are negative. ? ?Physical Exam ?Updated Vital Signs ?BP 128/77   Pulse (!) 102   Temp 99.5 ?F (37.5 ?C) (Oral)   Resp 20   Ht 5' 7.5" (1.715 m)   Wt (!) 158.8 kg   SpO2 98%   BMI 54.01 kg/m?  ?Physical Exam ?Vitals and nursing note reviewed.  ?Constitutional:   ?   Appearance: He is well-developed.  ?HENT:  ?   Head: Normocephalic and atraumatic.  ?Eyes:  ?   General: No scleral icterus. ?   Pupils: Pupils are equal, round, and reactive to light.  ?Cardiovascular:  ?   Heart sounds: Normal heart sounds.  ?Pulmonary:  ?   Effort: Pulmonary effort is normal.  ?   Breath sounds: Normal breath sounds. No wheezing.  ?Chest:  ?   Chest wall: No tenderness.  ?Abdominal:  ?   General: Bowel sounds  are normal. There is no distension.  ?   Palpations: Abdomen is soft.  ?   Tenderness: There is no abdominal tenderness.  ?Musculoskeletal:     ?   General: No tenderness or deformity.  ?   Cervical back: Normal range of motion.  ?Skin: ?   General: Skin is warm and dry.  ?Neurological:  ?   Mental Status: He is alert and oriented to person, place, and time.  ? ? ?ED Results / Procedures / Treatments   ?Labs ?(all labs ordered are listed, but only abnormal results are displayed) ?Labs Reviewed  ?COMPREHENSIVE METABOLIC PANEL - Abnormal; Notable for the following components:  ?    Result Value  ? AST 43 (*)   ? All other components within normal limits   ?SALICYLATE LEVEL - Abnormal; Notable for the following components:  ? Salicylate Lvl <7.0 (*)   ? All other components within normal limits  ?ACETAMINOPHEN LEVEL - Abnormal; Notable for the following components:  ? Acetaminophen (Tylenol), Serum <10 (*)   ? All other components within normal limits  ?RESP PANEL BY RT-PCR (FLU A&B, COVID) ARPGX2  ?ETHANOL  ?RAPID URINE DRUG SCREEN, HOSP PERFORMED  ?CBC WITH DIFFERENTIAL/PLATELET  ? ? ?EKG ?None ? ?Radiology ?No results found. ? ?Procedures ?Procedures  ? ?Medications Ordered in ED ?Medications  ?acetaminophen (TYLENOL) tablet 1,000 mg (has no administration in time range)  ? ? ?ED Course/ Medical Decision Making/ A&P ?  ?                        ?Medical Decision Making ? ?This patient presents to the ED for concern of auditory hallucinations, this involves a number of treatment options, and is a complaint that carries with it a high risk of complications and morbidity.  ? ? ?Co morbidities: ?Discussed in HPI ? ? ?Brief History: ? ?Patient with underlying schizophrenia here for auditory hallucinations telling him to harm himself.  Prior history of SI, no prior suicide attempt.  No homicidal ideations, no visual hallucinations.  Currently on Invega for schizophrenia, last shot received on May 4. ? ?EMR reviewed including pt PMHx, past surgical history and past visits to ER.  ? ?See HPI for more details ? ? ?Lab Tests: ? ?I ordered and independently interpreted labs.  The pertinent results include:   ? ?I personally reviewed all laboratory work and imaging. Metabolic panel without any acute abnormality specifically kidney function within normal limits and no significant electrolyte abnormalities. CBC without leukocytosis or significant anemia. ? ? ?Imaging Studies: ? ?No imaging studies ordered for this patient ? ? ? ?Cardiac Monitoring: ? ?The patient was maintained on a cardiac monitor.  I personally viewed and interpreted the cardiac monitored which showed an  underlying rhythm of: NSR ?EKG non-ischemic ? ? ?Medicines ordered: ? ?I ordered medication including N/A   ? ? ?Social Determinants of Health: ? ?The patient's social determinants of health were a factor in the care of this patient ? Patient accompanied by mother who currently resides with patient.  ? ? ?Problem List / ED Course: ? ?Patient with underlying schizophrenia currently on Invega, last injection approximately 5 days ago.  Complaining of auditory hallucinations, labs are unremarkable.  Does not report any pain on today's visit.  Vitals are within normal limits, will need psychiatric evaluation. ? ? ?Dispostion: ? ?After consideration of the diagnostic results and the patients response to treatment, I feel that the patent would benefit from TTS  evaluated. Patient is medically cleared for evaluation.  ?  ? ?Portions of this note were generated with Scientist, clinical (histocompatibility and immunogenetics). Dictation errors may occur despite best attempts at proofreading.   ?Final Clinical Impression(s) / ED Diagnoses ?Final diagnoses:  ?Suicidal ideation  ?Auditory hallucinations  ? ? ?Rx / DC Orders ?ED Discharge Orders   ? ? None  ? ?  ? ? ?  ?Claude Manges, PA-C ?08/28/21 1930 ? ?  ?Glendora Score, MD ?08/28/21 2331 ? ?

## 2021-08-28 NOTE — ED Triage Notes (Signed)
Pt arrived POV from home c/o auditory hallucinations that are telling him to slit his wrist and he got scared and came here. ?

## 2021-08-28 NOTE — ED Notes (Signed)
Pt belongings placed in locker 4

## 2021-08-28 NOTE — ED Provider Triage Note (Signed)
Emergency Medicine Provider Triage Evaluation Note ? ?Noah Ramsey , a 26 y.o. male  was evaluated in triage.  Pt complains of audiotry hallucinations.  ? ?For a few months has been hearing voices telling him to hurt himself.  ? ? ?May 4th he states he had last invega shot - takes this for schizophrenia ? ? ? ?Review of Systems  ?Positive: Auditory hallucinations  ?Negative: CP, fever ? ?Physical Exam  ?BP (!) 151/71   Pulse (!) 115   Temp 99.5 ?F (37.5 ?C) (Oral)   Resp (!) 22   Ht 5' 7.5" (1.715 m)   Wt (!) 158.8 kg   SpO2 95%   BMI 54.01 kg/m?  ?Gen:   Awake, no distress   ?Resp:  Normal effort  ?MSK:   Moves extremities without difficulty  ?Other:  Pleasant, earnest 26 year old in no acute distress ? ?Medical Decision Making  ?Medically screening exam initiated at 4:22 PM.  Appropriate orders placed.  Noah Ramsey was informed that the remainder of the evaluation will be completed by another provider, this initial triage assessment does not replace that evaluation, and the importance of remaining in the ED until their evaluation is complete. ? ?Labs ? ? ?Mild headache - will give tylenol ?  ?Tedd Sias, [place] ?08/28/21 1624 ? ?

## 2021-08-28 NOTE — ED Notes (Signed)
Pt provided a [place] sandwhich, graham crackers and ginger ale. ?

## 2021-08-28 NOTE — ED Notes (Signed)
Pt changing into scrubs 

## 2021-08-29 ENCOUNTER — Encounter (HOSPITAL_COMMUNITY): Payer: Self-pay | Admitting: Registered Nurse

## 2021-08-29 ENCOUNTER — Ambulatory Visit: Payer: Medicare Other | Admitting: Emergency Medicine

## 2021-08-29 DIAGNOSIS — F259 Schizoaffective disorder, unspecified: Secondary | ICD-10-CM

## 2021-08-29 DIAGNOSIS — R000 Tachycardia, unspecified: Secondary | ICD-10-CM | POA: Diagnosis not present

## 2021-08-29 DIAGNOSIS — R45851 Suicidal ideations: Secondary | ICD-10-CM

## 2021-08-29 DIAGNOSIS — R440 Auditory hallucinations: Secondary | ICD-10-CM

## 2021-08-29 NOTE — Consult Note (Signed)
Telepsych Consultation  ? ?Reason for Consult:  auditory hallucinations ?Referring Physician:  Claude Manges, PA-C ?Location of Patient: [facility] ED ?Location of Provider: Other: [facility] ? ?Patient Identification: Noah Ramsey ?MRN:  [id number] ?Principal Diagnosis: Schizophrenia, schizoaffective, chronic with acute exacerbation (HCC) ?Diagnosis:  Principal Problem: ?  Schizophrenia, schizoaffective, chronic with acute exacerbation (HCC) ? ? ?Total Time spent with patient: 30 minutes ? ?Subjective:   ?Jaisen Wiltrout is a 26 y.o. male patient admitted to [facility] ED after presenting with complaints of auditory hallucinations voices telling him to slit his wrist.  ? ?HPI:  [name], 26 y.o., male patient seen via tele health by this provider, consulted with Dr. Nelly Rout; and chart reviewed on 08/29/21.  On evaluation Escher Harr reports  he came to the hospital because the whispers got bad last night.  States voices telling him to hurt himself.  Patient reports that the voices never go away but "they were bad last night."  Reports that has outpatient psychiatric services with [facility] and visits once weekly also "one hour therapy weekly."  Reports he feels better this morning and the whispers are better.  States he takes medications as prescribed.  At this time he dens suicidal/self-harm/homicidal ideation. States some paranoia while in hospital because "I feel like the staff knows everything about me and I don't know nothing about them."  Denies any stressors at home "Other than my condition being a burden on my family sometimes."  Reports he is eating and sleeping without difficulty.  ?During evaluation Keyandre Pileggi is laying in bed in no acute distress.  He is alert, oriented x 4, calm and cooperative.  His mood is dysphoric.  He does not appear to be responding to internal/external stimuli or delusional thoughts however he reports chronic auditory hallucinations that are at a  whisper right now and are improved from yesterday.  States that the whispers never go away and at time louder than others.  Patient denies suicidal/self-harm/homicidal ideation.  Patient answered question appropriately. ? ?[facility] Care Coordinator will reach out to [facility] ? ? ?Past Psychiatric History: See above ? ?Risk to Self:  Denies ?Risk to Others:  Denies ?Prior Inpatient Therapy:  Yes ?Prior Outpatient Therapy:  Yes ? ?Past Medical History:  ?Past Medical History:  ?Diagnosis Date  ? Elevated CPK   ? per patient  ? Schizophrenia (HCC)   ? History reviewed. No pertinent surgical history. ?Family History:  ?Family History  ?Problem Relation Age of Onset  ? Mental illness Brother   ? ?Family Psychiatric  History: None reported ?Social History:  ?Social History  ? ?Substance and Sexual Activity  ?Alcohol Use Never  ?   ?Social History  ? ?Substance and Sexual Activity  ?Drug Use Never  ?  ?Social History  ? ?Socioeconomic History  ? Marital status: Single  ?  Spouse name: Not on file  ? Number of children: Not on file  ? Years of education: Not on file  ? Highest education level: Not on file  ?Occupational History  ? Not on file  ?Tobacco Use  ? Smoking status: Never  ? Smokeless tobacco: Never  ?Substance and Sexual Activity  ? Alcohol use: Never  ? Drug use: Never  ? Sexual activity: Not on file  ?Other Topics Concern  ? Not on file  ?Social History Narrative  ? Not on file  ? ?Social Determinants of Health  ? ?Financial Resource Strain: Not on file  ?Food Insecurity: Not  on file  ?Transportation Needs: Not on file  ?Physical Activity: Not on file  ?Stress: Not on file  ?Social Connections: Not on file  ? ?Additional Social History: ?  ? ?Allergies:  No Known Allergies ? ?Labs:  ?Results for orders placed or performed during the hospital encounter of 08/28/21 (from the past 48 hour(s))  ?Resp Panel by RT-PCR (Flu A&B, Covid) Nasopharyngeal Swab     Status: None  ? Collection Time: 08/28/21  4:23 PM  ?  Specimen: Nasopharyngeal Swab; Nasopharyngeal(NP) swabs in vial transport medium  ?Result Value Ref Range  ? SARS Coronavirus 2 by RT PCR NEGATIVE NEGATIVE  ?  Comment: (NOTE) ?SARS-CoV-2 target nucleic acids are NOT DETECTED. ? ?The SARS-CoV-2 RNA is generally detectable in upper respiratory ?specimens during the acute phase of infection. The lowest ?concentration of SARS-CoV-2 viral copies this assay can detect is ?138 copies/mL. A negative result does not preclude SARS-Cov-2 ?infection and should not be used as the sole basis for treatment or ?other patient management decisions. A negative result may occur with  ?improper specimen collection/handling, submission of specimen other ?than nasopharyngeal swab, presence of viral mutation(s) within the ?areas targeted by this assay, and inadequate number of viral ?copies(<138 copies/mL). A negative result must be combined with ?clinical observations, patient history, and epidemiological ?information. The expected result is Negative. ? ?Fact Sheet for Patients:  ?BloggerCourse.com ? ?Fact Sheet for Healthcare Providers:  ?SeriousBroker.it ? ?This test is no t yet approved or cleared by the [place] FDA and  ?has been authorized for detection and/or diagnosis of SARS-CoV-2 by ?FDA under an Emergency Use Authorization (EUA). This EUA will remain  ?in effect (meaning this test can be used) for the duration of the ?COVID-19 declaration under Section 564(b)(1) of the Act, 21 ?U.S.C.section 360bbb-3(b)(1), unless the authorization is terminated  ?or revoked sooner.  ? ? ?  ? Influenza A by PCR NEGATIVE NEGATIVE  ? Influenza B by PCR NEGATIVE NEGATIVE  ?  Comment: (NOTE) ?The Xpert Xpress SARS-CoV-2/FLU/RSV plus assay is intended as an aid ?in the diagnosis of influenza from Nasopharyngeal swab specimens and ?should not be used as a sole basis for treatment. Nasal washings and ?aspirates are unacceptable for Xpert Xpress  SARS-CoV-2/FLU/RSV ?testing. ? ?Fact Sheet for Patients: ?BloggerCourse.com ? ?Fact Sheet for Healthcare Providers: ?SeriousBroker.it ? ?This test is not yet approved or cleared by the [place] FDA and ?has been authorized for detection and/or diagnosis of SARS-CoV-2 by ?FDA under an Emergency Use Authorization (EUA). This EUA will remain ?in effect (meaning this test can be used) for the duration of the ?COVID-19 declaration under Section 564(b)(1) of the Act, 21 U.S.C. ?section 360bbb-3(b)(1), unless the authorization is terminated or ?revoked. ? ?Performed at [facility], [address] [place], [place] ?[address] ?  ?Comprehensive metabolic panel     Status: Abnormal  ? Collection Time: 08/28/21  4:31 PM  ?Result Value Ref Range  ? Sodium 139 135 - 145 mmol/L  ? Potassium 3.9 3.5 - 5.1 mmol/L  ? Chloride 103 98 - 111 mmol/L  ? CO2 26 22 - 32 mmol/L  ? Glucose, Bld 89 70 - 99 mg/dL  ?  Comment: Glucose reference range applies only to samples taken after fasting for at least 8 hours.  ? BUN 15 6 - 20 mg/dL  ? Creatinine, Ser 1.10 0.61 - 1.24 mg/dL  ? Calcium 9.6 8.9 - 10.3 mg/dL  ? Total Protein 7.8 6.5 - 8.1 g/dL  ?  Albumin 3.9 3.5 - 5.0 g/dL  ? AST 43 (H) 15 - 41 U/L  ? ALT 34 0 - 44 U/L  ? Alkaline Phosphatase 57 38 - 126 U/L  ? Total Bilirubin 0.6 0.3 - 1.2 mg/dL  ? GFR, Estimated >60 >60 mL/min  ?  Comment: (NOTE) ?Calculated using the CKD-EPI Creatinine Equation (2021) ?  ? Anion gap 10 5 - 15  ?  Comment: Performed at [facility], [address] [place], [place] [address]  ?Ethanol     Status: None  ? Collection Time: 08/28/21  4:31 PM  ?Result Value Ref Range  ? Alcohol, Ethyl (B) <10 <10 mg/dL  ?  Comment: (NOTE) ?Lowest detectable limit for serum alcohol is 10 mg/dL. ? ?For medical purposes only. ?Performed at [facility], [address] [place], [place] ?[id number] ?  ?CBC with Diff     Status: None  ? Collection Time: 08/28/21   4:31 PM  ?Result Value Ref Range  ? WBC 5.6 4.0 - 10.5 K/uL  ? RBC 4.33 4.22 - 5.81 MIL/uL  ? Hemoglobin 13.8 13.0 - 17.0 g/dL  ? HCT 40.3 39.0 - 52.0 %  ? MCV 93.1 80.0 - 100.0 fL  ? MCH 31.9 26.0 - 34.

## 2021-08-29 NOTE — ED Notes (Signed)
Pt resting in bed lying on left side with eyes closed. NAD noted. Equal and unlabored respirations noted. Care plan continued. ?

## 2021-08-29 NOTE — ED Notes (Signed)
[facility] arrived to pick up pt. Belongings returned to pt, pt signed belongings paperwork, reviewed dc paperwork with pt ?

## 2021-08-29 NOTE — ED Provider Notes (Addendum)
Emergency Medicine Observation Re-evaluation Note ? ?Noah Ramsey is a 26 y.o. male, seen on rounds today.  Pt initially presented to the ED for complaints of Psychiatric Evaluation ?Currently, the patient is resting comfortably. ? ?Physical Exam  ?BP 119/69 (BP Location: Left Arm)   Pulse 77   Temp (!) 97.5 ?F (36.4 ?C) (Oral)   Resp 17   Ht 5' 7.5" (1.715 m)   Wt (!) 158.8 kg   SpO2 99%   BMI 54.01 kg/m?  ?Physical Exam ?General: Resting ?Cardiac: No murmur ?Lungs: Lungs clear ?Psych: Resting without agitation at this time ? ?ED Course / MDM  ?EKG:EKG Interpretation ? ?Date/Time:  Tuesday Aug 28 2021 16:57:45 EDT ?Ventricular Rate:  101 ?PR Interval:  97 ?QRS Duration: 117 ?QT Interval:  343 ?QTC Calculation: 445 ?R Axis:   91 ?Text Interpretation: Sinus tachycardia Incomplete right bundle branch block LVH with IVCD and secondary repol abnrm No significant change since last tracing Confirmed by Noah Ramsey ([id number]) on 08/28/2021 7:31:36 PM ? ?I have reviewed the labs performed to date as well as medications administered while in observation.  Recent changes in the last 24 hours include none reported. ? ?Plan  ?Current plan is for awaiting reassessment by psychiatry this morning to determine disposition. ? Noah Ramsey is not under involuntary commitment. ? ? ?  ?Noah Ramsey, Noah Allegra, MD ?08/29/21 [phone] ? ? ?1:52 PM ?Was informed that psychiatry reassessed patient and they now feel he is safe for discharge home.  They contacted ACTT team.  They and social worker provided resources.  Patient will be discharged when transportation has arrived. ? ?Clinical Impression: ?1. Suicidal ideation   ?2. Auditory hallucinations   ? ? ?Disposition: Discharge ? ?Condition: Good ? ?I have discussed the results, Dx and Tx plan with the pt(& family if present). He/she/they expressed understanding and agree(s) with the plan. Discharge instructions discussed at great length. Strict return precautions discussed and  pt &/or family have verbalized understanding of the instructions. No further questions at time of discharge.  ? ? ?New Prescriptions  ? No medications on file  ? ? ?Follow Up: ?No follow-up provider specified. ? ? ?  ?Noah Ramsey, Noah Allegra, MD ?08/29/21 1358 ? ?

## 2021-08-29 NOTE — Discharge Instructions (Signed)
For your behavioral health needs you are advised to continue treatment with the [facility]:       [facility]      [address]      [place], [place] [address]      [phone]      24 hour crisis number: [phone]  

## 2021-08-29 NOTE — BH Assessment (Signed)
[facility] Assessment Progress Note ?  ?Per Shuvon Rankin, NP, this pt does not require psychiatric hospitalization at this time.  Pt is psychiatrically cleared.  Discharge instructions advise pt to continue treatment with the [facility].  At 13:47 I called them and spoke to [place].  He reports that he will send a clinician to [facility] to pick pt up.  I provided the ED phone number [phone]) for follow up.  EDP Lynden Oxford, MD and pt's nurse, Chrissie Noa, have been notified. ? ?Doylene Canning, MA ?Triage Specialist ?[phone] ? ?

## 2021-08-29 NOTE — ED Notes (Signed)
Breakfast orders placed 

## 2021-11-06 ENCOUNTER — Ambulatory Visit (INDEPENDENT_AMBULATORY_CARE_PROVIDER_SITE_OTHER): Payer: Medicare Other | Admitting: Emergency Medicine

## 2021-11-06 ENCOUNTER — Encounter: Payer: Self-pay | Admitting: Emergency Medicine

## 2021-11-06 VITALS — BP 116/84 | HR 116 | Temp 99.0°F | Ht 67.5 in | Wt 355.5 lb

## 2021-11-06 DIAGNOSIS — R7303 Prediabetes: Secondary | ICD-10-CM | POA: Diagnosis not present

## 2021-11-06 DIAGNOSIS — F202 Catatonic schizophrenia: Secondary | ICD-10-CM | POA: Diagnosis not present

## 2021-11-06 DIAGNOSIS — Z0000 Encounter for general adult medical examination without abnormal findings: Secondary | ICD-10-CM | POA: Diagnosis not present

## 2021-11-06 DIAGNOSIS — Z1329 Encounter for screening for other suspected endocrine disorder: Secondary | ICD-10-CM

## 2021-11-06 DIAGNOSIS — Z13228 Encounter for screening for other metabolic disorders: Secondary | ICD-10-CM | POA: Diagnosis not present

## 2021-11-06 DIAGNOSIS — Z1159 Encounter for screening for other viral diseases: Secondary | ICD-10-CM

## 2021-11-06 DIAGNOSIS — Z130 Encounter for screening for diseases of the blood and blood-forming organs and certain disorders involving the immune mechanism: Secondary | ICD-10-CM

## 2021-11-06 DIAGNOSIS — Z114 Encounter for screening for human immunodeficiency virus [HIV]: Secondary | ICD-10-CM

## 2021-11-06 DIAGNOSIS — Z13220 Encounter for screening for lipoid disorders: Secondary | ICD-10-CM

## 2021-11-06 LAB — HEMOGLOBIN A1C: Hgb A1c MFr Bld: 6 % (ref 4.6–6.5)

## 2021-11-06 LAB — CBC WITH DIFFERENTIAL/PLATELET
Basophils Absolute: 0 10*3/uL (ref 0.0–0.1)
Basophils Relative: 0.3 % (ref 0.0–3.0)
Eosinophils Absolute: 0.1 10*3/uL (ref 0.0–0.7)
Eosinophils Relative: 1.5 % (ref 0.0–5.0)
HCT: 40.4 % (ref 39.0–52.0)
Hemoglobin: 13.5 g/dL (ref 13.0–17.0)
Lymphocytes Relative: 34 % (ref 12.0–46.0)
Lymphs Abs: 1.6 10*3/uL (ref 0.7–4.0)
MCHC: 33.4 g/dL (ref 30.0–36.0)
MCV: 93.4 fl (ref 78.0–100.0)
Monocytes Absolute: 0.5 10*3/uL (ref 0.1–1.0)
Monocytes Relative: 9.9 % (ref 3.0–12.0)
Neutro Abs: 2.5 10*3/uL (ref 1.4–7.7)
Neutrophils Relative %: 54.3 % (ref 43.0–77.0)
Platelets: 226 10*3/uL (ref 150.0–400.0)
RBC: 4.32 Mil/uL (ref 4.22–5.81)
RDW: 14.5 % (ref 11.5–15.5)
WBC: 4.7 10*3/uL (ref 4.0–10.5)

## 2021-11-06 LAB — LIPID PANEL
Cholesterol: 167 mg/dL (ref 0–200)
HDL: 40.1 mg/dL (ref >39.00)
LDL Cholesterol: 93 mg/dL (ref 0–99)
NonHDL: 126.64
Total CHOL/HDL Ratio: 4
Triglycerides: 167 mg/dL — ABNORMAL HIGH (ref 0.0–149.0)
VLDL: 33.4 mg/dL (ref 0.0–40.0)

## 2021-11-06 LAB — COMPREHENSIVE METABOLIC PANEL
ALT: 37 U/L (ref 0–53)
AST: 45 U/L — ABNORMAL HIGH (ref 0–37)
Albumin: 4.4 g/dL (ref 3.5–5.2)
Alkaline Phosphatase: 60 U/L (ref 39–117)
BUN: 18 mg/dL (ref 6–23)
CO2: 27 mEq/L (ref 19–32)
Calcium: 9.4 mg/dL (ref 8.4–10.5)
Chloride: 103 mEq/L (ref 96–112)
Creatinine, Ser: 0.92 mg/dL (ref 0.40–1.50)
GFR: 115.36 mL/min (ref >60.00)
Glucose, Bld: 119 mg/dL — ABNORMAL HIGH (ref 70–99)
Potassium: 4 mEq/L (ref 3.5–5.1)
Sodium: 138 mEq/L (ref 135–145)
Total Bilirubin: 0.4 mg/dL (ref 0.2–1.2)
Total Protein: 8 g/dL (ref 6.0–8.3)

## 2021-11-06 NOTE — Patient Instructions (Signed)
Health Maintenance, Male Adopting a healthy lifestyle and getting preventive care are important in promoting health and wellness. Ask your health care provider about: The right schedule for you to have regular tests and exams. Things you can do on your own to prevent diseases and keep yourself healthy. What should I know about diet, weight, and exercise? Eat a healthy diet  Eat a diet that includes plenty of vegetables, fruits, low-fat dairy products, and lean protein. Do not eat a lot of foods that are high in solid fats, added sugars, or sodium. Maintain a healthy weight Body mass index (BMI) is a measurement that can be used to identify possible weight problems. It estimates body fat based on height and weight. Your health care provider can help determine your BMI and help you achieve or maintain a healthy weight. Get regular exercise Get regular exercise. This is one of the most important things you can do for your health. Most adults should: Exercise for at least 150 minutes each week. The exercise should increase your heart rate and make you sweat (moderate-intensity exercise). Do strengthening exercises at least twice a week. This is in addition to the moderate-intensity exercise. Spend less time sitting. Even light physical activity can be beneficial. Watch cholesterol and blood lipids Have your blood tested for lipids and cholesterol at 26 years of age, then have this test every 5 years. You may need to have your cholesterol levels checked more often if: Your lipid or cholesterol levels are high. You are older than 26 years of age. You are at high risk for heart disease. What should I know about cancer screening? Many types of cancers can be detected early and may often be prevented. Depending on your health history and family history, you may need to have cancer screening at various ages. This may include screening for: Colorectal cancer. Prostate cancer. Skin cancer. Lung  cancer. What should I know about heart disease, diabetes, and high blood pressure? Blood pressure and heart disease High blood pressure causes heart disease and increases the risk of stroke. This is more likely to develop in people who have high blood pressure readings or are overweight. Talk with your health care provider about your target blood pressure readings. Have your blood pressure checked: Every 3-5 years if you are 18-39 years of age. Every year if you are 40 years old or older. If you are between the ages of 65 and 75 and are a current or former smoker, ask your health care provider if you should have a one-time screening for abdominal aortic aneurysm (AAA). Diabetes Have regular diabetes screenings. This checks your fasting blood sugar level. Have the screening done: Once every three years after age 45 if you are at a normal weight and have a low risk for diabetes. More often and at a younger age if you are overweight or have a high risk for diabetes. What should I know about preventing infection? Hepatitis B If you have a higher risk for hepatitis B, you should be screened for this virus. Talk with your health care provider to find out if you are at risk for hepatitis B infection. Hepatitis C Blood testing is recommended for: Everyone born from 1945 through 1965. Anyone with known risk factors for hepatitis C. Sexually transmitted infections (STIs) You should be screened each year for STIs, including gonorrhea and chlamydia, if: You are sexually active and are younger than 26 years of age. You are older than 26 years of age and your   health care provider tells you that you are at risk for this type of infection. Your sexual activity has changed since you were last screened, and you are at increased risk for chlamydia or gonorrhea. Ask your health care provider if you are at risk. Ask your health care provider about whether you are at high risk for HIV. Your health care provider  may recommend a prescription medicine to help prevent HIV infection. If you choose to take medicine to prevent HIV, you should first get tested for HIV. You should then be tested every 3 months for as long as you are taking the medicine. Follow these instructions at home: Alcohol use Do not drink alcohol if your health care provider tells you not to drink. If you drink alcohol: Limit how much you have to 0-2 drinks a day. Know how much alcohol is in your drink. In the [place] one drink equals one 12 oz bottle of beer (355 mL), one 5 oz glass of wine (148 mL), or one 1 oz glass of hard liquor (44 mL). Lifestyle Do not use any products that contain nicotine or tobacco. These products include cigarettes, chewing tobacco, and vaping devices, such as e-cigarettes. If you need help quitting, ask your health care provider. Do not use street drugs. Do not share needles. Ask your health care provider for help if you need support or information about quitting drugs. General instructions Schedule regular health, dental, and eye exams. Stay current with your vaccines. Tell your health care provider if: You often feel depressed. You have ever been abused or do not feel safe at home. Summary Adopting a healthy lifestyle and getting preventive care are important in promoting health and wellness. Follow your health care provider's instructions about healthy diet, exercising, and getting tested or screened for diseases. Follow your health care provider's instructions on monitoring your cholesterol and blood pressure. This information is not intended to replace advice given to you by your health care provider. Make sure you discuss any questions you have with your health care provider. Document Revised: 08/28/2020 Document Reviewed: 08/28/2020 Elsevier Patient Education  2023 [employer] Inc.  

## 2021-11-06 NOTE — Progress Notes (Signed)
Noah Ramsey 25 y.o.   Chief Complaint  Patient presents with   Annual Exam   Back Pain    Lower back pain     HISTORY OF PRESENT ILLNESS: This is a 26 y.o. male here for annual exam. History of schizophrenia. History of prediabetes. Non-smoker. Exercises regularly.   Back Pain Pertinent negatives include no chest pain or fever.     Prior to Admission medications   Medication Sig Start Date End Date Taking? Authorizing Provider  benztropine (COGENTIN) 0.5 MG tablet Take 0.5 mg by mouth every 6 (six) hours as needed for tremors.   Yes [provider]  buPROPion (WELLBUTRIN XL) 150 MG 24 hr tablet Take 150 mg by mouth every morning. 10/18/21  Yes [provider]  hydrOXYzine (ATARAX/VISTARIL) 25 MG tablet Take 25 mg by mouth every 6 (six) hours as needed for anxiety.   Yes [provider]  INVEGA SUSTENNA 156 MG/ML SUSY injection Inject into the muscle. 03/09/21  Yes [provider]  metFORMIN (GLUCOPHAGE) 500 MG tablet Take 1 tablet (500 mg total) by mouth 2 (two) times daily with a meal. :Medication induced wt management 01/05/21  Yes Armandina Stammer I, NP  Semaglutide,0.25 or 0.5MG /DOS, (OZEMPIC, 0.25 OR 0.5 MG/DOSE,) 2 MG/1.5ML SOPN Inject 0.5 mg into the skin once a week. 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  Social  History   Socioeconomic History   Marital status: Single    Spouse name: Not on file   Number of children: Not on file   Years of education: Not on file   Highest education level: Not on file  Occupational History   Not on file  Tobacco Use   Smoking status: Never   Smokeless tobacco: Never  Substance and Sexual Activity   Alcohol use: Never   Drug use: Never   Sexual activity: Not on file  Other Topics Concern   Not on file  Social History Narrative   Not on file   Social Determinants of Health   Financial Resource Strain: Not on file  Food Insecurity: Not on file  Transportation Needs: Not on file  Physical Activity: Not on file  Stress: Not on file  Social Connections: Not on file  Intimate Partner Violence: Not on file    Family History  Problem Relation Age of Onset   Mental illness Brother      Review of Systems  Constitutional: Negative.  Negative for chills and fever.  HENT: Negative.  Negative for congestion and sore throat.   Respiratory: Negative.  Negative for cough and shortness of breath.   Cardiovascular: Negative.  Negative for chest pain and palpitations.  Gastrointestinal:  Negative for nausea and vomiting.  Genitourinary: Negative.   Musculoskeletal:  Positive for back pain.  Skin: Negative.  Negative for rash.  Neurological: Negative.  Negative  for dizziness.  All other systems reviewed and are negative.  Today's Vitals   11/06/21 1306  BP: 116/84  Pulse: (!) 116  Temp: 99 F (37.2 C)  TempSrc: Oral  SpO2: 94%  Weight: (!) 355 lb 8 oz (161.3 kg)  Height: 5' 7.5" (1.715 m)   Body mass index is 54.86 kg/m.   Physical Exam Vitals reviewed.  Constitutional:      Appearance: Normal appearance. He is obese.  HENT:     Head: Normocephalic.     Right Ear: Tympanic membrane, ear canal and external ear normal.     Left Ear: Tympanic membrane, ear canal and external ear normal.     Mouth/Throat:     Mouth: Mucous membranes are moist.      Pharynx: Oropharynx is clear.  Eyes:     Extraocular Movements: Extraocular movements intact.     Conjunctiva/sclera: Conjunctivae normal.     Pupils: Pupils are equal, round, and reactive to light.  Cardiovascular:     Rate and Rhythm: Normal rate and regular rhythm.     Pulses: Normal pulses.     Heart sounds: Normal heart sounds.  Pulmonary:     Effort: Pulmonary effort is normal.     Breath sounds: Normal breath sounds.  Abdominal:     General: Bowel sounds are normal. There is no distension.     Palpations: Abdomen is soft.     Tenderness: There is no abdominal tenderness.  Musculoskeletal:     Cervical back: No tenderness.     Right lower leg: No edema.     Left lower leg: No edema.  Lymphadenopathy:     Cervical: No cervical adenopathy.  Skin:    General: Skin is warm and dry.     Capillary Refill: Capillary refill takes less than 2 seconds.  Neurological:     General: No focal deficit present.     Mental Status: He is alert and oriented to person, place, and time.  Psychiatric:        Mood and Affect: Mood normal.        Behavior: Behavior normal.      ASSESSMENT & PLAN: Problem List Items Addressed This Visit       Other   Schizophrenia, catatonic type (HCC)   Prediabetes   Relevant Orders   Hemoglobin A1c   Morbid obesity (HCC)   Other Visit Diagnoses     Routine general medical examination at a health care facility    -  Primary   Need for hepatitis C screening test       Relevant Orders   Hepatitis C antibody screen   Screening for HIV (human immunodeficiency virus)       Relevant Orders   HIV antibody   Screening for deficiency anemia       Relevant Orders   CBC with Differential   Screening for lipoid disorders       Relevant Orders   Lipid panel   Screening for endocrine, metabolic and immunity disorder       Relevant Orders   Comprehensive metabolic panel   Hemoglobin A1c      Modifiable risk factors discussed with  patient. Anticipatory guidance according to age provided. The following topics were also discussed: Social Determinants of Health Smoking.  Non-smoker Diet and nutrition and need to decrease amount of daily carbohydrate intake and daily calories Benefits of exercise Cancer family history review Vaccinations recommendations Cardiovascular risk assessment Mental health including depression and anxiety  Fall and accident prevention  Patient Instructions  Health Maintenance, Male Adopting a healthy lifestyle and getting preventive care are important in promoting health and wellness. Ask your health care provider about: The right schedule for you to have regular tests and exams. Things you can do on your own to prevent diseases and keep yourself healthy. What should I know about diet, weight, and exercise? Eat a healthy diet  Eat a diet that includes plenty of vegetables, fruits, low-fat dairy products, and lean protein. Do not eat a lot of foods that are high in solid fats, added sugars, or sodium. Maintain a healthy weight Body mass index (BMI) is a measurement that can be used to identify possible weight problems. It estimates body fat based on height and weight. Your health care provider can help determine your BMI and help you achieve or maintain a healthy weight. Get regular exercise Get regular exercise. This is one of the most important things you can do for your health. Most adults should: Exercise for at least 150 minutes each week. The exercise should increase your heart rate and make you sweat (moderate-intensity exercise). Do strengthening exercises at least twice a week. This is in addition to the moderate-intensity exercise. Spend less time sitting. Even light physical activity can be beneficial. Watch cholesterol and blood lipids Have your blood tested for lipids and cholesterol at 26 years of age, then have this test every 5 years. You may need to have your cholesterol  levels checked more often if: Your lipid or cholesterol levels are high. You are older than 26 years of age. You are at high risk for heart disease. What should I know about cancer screening? Many types of cancers can be detected early and may often be prevented. Depending on your health history and family history, you may need to have cancer screening at various ages. This may include screening for: Colorectal cancer. Prostate cancer. Skin cancer. Lung cancer. What should I know about heart disease, diabetes, and high blood pressure? Blood pressure and heart disease High blood pressure causes heart disease and increases the risk of stroke. This is more likely to develop in people who have high blood pressure readings or are overweight. Talk with your health care provider about your target blood pressure readings. Have your blood pressure checked: Every 3-5 years if you are 26-50 years of age. Every year if you are 16 years old or older. If you are between the ages of 51 and 29 and are a current or former smoker, ask your health care provider if you should have a one-time screening for abdominal aortic aneurysm (AAA). Diabetes Have regular diabetes screenings. This checks your fasting blood sugar level. Have the screening done: Once every three years after age 63 if you are at a normal weight and have a low risk for diabetes. More often and at a younger age if you are overweight or have a high risk for diabetes. What should I know about preventing infection? Hepatitis B If you have a higher risk for hepatitis B, you should be screened for this virus. Talk with your health care provider to find out if you are at risk for hepatitis B infection. Hepatitis C Blood testing is recommended for: Everyone born from [birth year] through 1965. Anyone with known risk factors for hepatitis C. Sexually transmitted infections (STIs) You should be screened each year for STIs, including gonorrhea and chlamydia,  if: You are sexually active and are younger than 26 years of age. You  are older than 26 years of age and your health care provider tells you that you are at risk for this type of infection. Your sexual activity has changed since you were last screened, and you are at increased risk for chlamydia or gonorrhea. Ask your health care provider if you are at risk. Ask your health care provider about whether you are at high risk for HIV. Your health care provider may recommend a prescription medicine to help prevent HIV infection. If you choose to take medicine to prevent HIV, you should first get tested for HIV. You should then be tested every 3 months for as long as you are taking the medicine. Follow these instructions at home: Alcohol use Do not drink alcohol if your health care provider tells you not to drink. If you drink alcohol: Limit how much you have to 0-2 drinks a day. Know how much alcohol is in your drink. In the [place] one drink equals one 12 oz bottle of beer (355 mL), one 5 oz glass of wine (148 mL), or one 1 oz glass of hard liquor (44 mL). Lifestyle Do not use any products that contain nicotine or tobacco. These products include cigarettes, chewing tobacco, and vaping devices, such as e-cigarettes. If you need help quitting, ask your health care provider. Do not use street drugs. Do not share needles. Ask your health care provider for help if you need support or information about quitting drugs. General instructions Schedule regular health, dental, and eye exams. Stay current with your vaccines. Tell your health care provider if: You often feel depressed. You have ever been abused or do not feel safe at home. Summary Adopting a healthy lifestyle and getting preventive care are important in promoting health and wellness. Follow your health care provider's instructions about healthy diet, exercising, and getting tested or screened for diseases. Follow your health care provider's  instructions on monitoring your cholesterol and blood pressure. This information is not intended to replace advice given to you by your health care provider. Make sure you discuss any questions you have with your health care provider. Document Revised: 08/28/2020 Document Reviewed: 08/28/2020 Elsevier Patient Education  2023 [employer] Inc.     Edwina Barth, MD [facility]

## 2021-11-07 LAB — HEPATITIS C ANTIBODY: Hepatitis C Ab: NONREACTIVE

## 2021-11-07 LAB — HIV ANTIBODY (ROUTINE TESTING W REFLEX): HIV 1&2 Ab, 4th Generation: NONREACTIVE

## 2021-11-16 DIAGNOSIS — L2089 Other atopic dermatitis: Secondary | ICD-10-CM | POA: Diagnosis not present

## 2021-11-16 DIAGNOSIS — L810 Postinflammatory hyperpigmentation: Secondary | ICD-10-CM | POA: Diagnosis not present

## 2021-11-16 DIAGNOSIS — L648 Other androgenic alopecia: Secondary | ICD-10-CM | POA: Diagnosis not present

## 2021-11-20 DIAGNOSIS — R7303 Prediabetes: Secondary | ICD-10-CM | POA: Diagnosis not present

## 2021-11-20 DIAGNOSIS — K219 Gastro-esophageal reflux disease without esophagitis: Secondary | ICD-10-CM | POA: Diagnosis not present

## 2021-12-06 DIAGNOSIS — R350 Frequency of micturition: Secondary | ICD-10-CM | POA: Diagnosis not present

## 2021-12-12 DIAGNOSIS — Z79899 Other long term (current) drug therapy: Secondary | ICD-10-CM | POA: Diagnosis not present

## 2021-12-13 DIAGNOSIS — T50905A Adverse effect of unspecified drugs, medicaments and biological substances, initial encounter: Secondary | ICD-10-CM | POA: Diagnosis not present

## 2022-01-08 DIAGNOSIS — R7303 Prediabetes: Secondary | ICD-10-CM | POA: Diagnosis not present

## 2022-01-17 DIAGNOSIS — L2089 Other atopic dermatitis: Secondary | ICD-10-CM | POA: Diagnosis not present

## 2022-01-17 DIAGNOSIS — L648 Other androgenic alopecia: Secondary | ICD-10-CM | POA: Diagnosis not present

## 2022-01-17 DIAGNOSIS — L810 Postinflammatory hyperpigmentation: Secondary | ICD-10-CM | POA: Diagnosis not present

## 2022-02-15 ENCOUNTER — Ambulatory Visit (INDEPENDENT_AMBULATORY_CARE_PROVIDER_SITE_OTHER): Payer: Medicare Other

## 2022-02-15 VITALS — Ht 67.0 in | Wt 358.0 lb

## 2022-02-15 DIAGNOSIS — Z0000 Encounter for general adult medical examination without abnormal findings: Secondary | ICD-10-CM

## 2022-02-15 NOTE — Progress Notes (Signed)
Subjective:   Noah Ramsey is a 26 y.o. male who presents for an Initial Medicare Annual Wellness Visit.   I connected with  Noah Ramsey on 02/15/22 by a audio enabled telemedicine application and verified that I am speaking with the correct person using two identifiers.  Patient Location: Home  Provider Location: Home Office  I discussed the limitations of evaluation and management by telemedicine. The patient expressed understanding and agreed to proceed.  Review of Systems     Cardiac Risk Factors include: advanced age (>3men, >67 women);hypertension     Objective:    Today's Vitals   02/15/22 1332  Weight: (!) 358 lb (162.4 kg)  Height: 5\' 7"  (1.702 m)   Body mass index is 56.07 kg/m.     02/15/2022    1:35 PM 04/16/2021    6:44 PM 01/26/2021    4:57 PM 01/22/2021   10:21 PM 01/21/2021    4:12 PM 12/04/2020    1:21 AM 11/07/2020    7:39 PM  Advanced Directives  Does Patient Have a Medical Advance Directive? No No No No No  No  Would patient like information on creating a medical advance directive? No - Patient declined No - Patient declined   No - Patient declined  No - Patient declined     Information is confidential and restricted. Go to Review Flowsheets to unlock data.    Current Medications (verified) Outpatient Encounter Medications as of 02/15/2022  Medication Sig   benztropine (COGENTIN) 0.5 MG tablet Take 0.5 mg by mouth every 6 (six) hours as needed for tremors.   buPROPion (WELLBUTRIN XL) 150 MG 24 hr tablet Take 150 mg by mouth every morning.   hydrOXYzine (ATARAX/VISTARIL) 25 MG tablet Take 25 mg by mouth every 6 (six) hours as needed for anxiety.   INVEGA SUSTENNA 156 MG/ML SUSY injection Inject into the muscle.   metFORMIN (GLUCOPHAGE) 500 MG tablet Take 1 tablet (500 mg total) by mouth 2 (two) times daily with a meal. :Medication induced wt management   Semaglutide,0.25 or 0.5MG /DOS, (OZEMPIC, 0.25 OR 0.5 MG/DOSE,) 2 MG/1.5ML SOPN  Inject 0.5 mg into the skin once a week.   No facility-administered encounter medications on file as of 02/15/2022.    Allergies (verified) Patient has no known allergies.   History: Past Medical History:  Diagnosis Date   Elevated CPK    per patient   Schizophrenia [facility])    History reviewed. No pertinent surgical history. Family History  Problem Relation Age of Onset   Mental illness Brother    Social History   Socioeconomic History   Marital status: Single    Spouse name: Not on file   Number of children: Not on file   Years of education: Not on file   Highest education level: Not on file  Occupational History   Not on file  Tobacco Use   Smoking status: Never   Smokeless tobacco: Never  Substance and Sexual Activity   Alcohol use: Never   Drug use: Never   Sexual activity: Not on file  Other Topics Concern   Not on file  Social History Narrative   Not on file   Social Determinants of Health   Financial Resource Strain: Low Risk  (02/15/2022)   Overall Financial Resource Strain (CARDIA)    Difficulty of Paying Living Expenses: Not hard at all  Food Insecurity: No Food Insecurity (02/15/2022)   Hunger Vital Sign    Worried About Running Out of Food in the  Last Year: Never true    Ran Out of Food in the Last Year: Never true  Transportation Needs: No Transportation Needs (02/15/2022)   PRAPARE - Administrator, Civil Service (Medical): No    Lack of Transportation (Non-Medical): No  Physical Activity: Insufficiently Active (02/15/2022)   Exercise Vital Sign    Days of Exercise per Week: 3 days    Minutes of Exercise per Session: 30 min  Stress: No Stress Concern Present (02/15/2022)   [employer] of Occupational Health - Occupational Stress Questionnaire    Feeling of Stress : Not at all  Social Connections: Moderately Isolated (02/15/2022)   Social Connection and Isolation Panel [NHANES]    Frequency of Communication with Friends and  Family: More than three times a week    Frequency of Social Gatherings with Friends and Family: More than three times a week    Attends Religious Services: More than 4 times per year    Active Member of [employer] or Organizations: No    Attends Engineer, structural: Never    Marital Status: Never married    Tobacco Counseling Counseling given: Not Answered   Clinical Intake:  Pre-visit preparation completed: Yes  Pain : No/denies pain     Nutritional Risks: None Diabetes: No  How often do you need to have someone help you when you read instructions, pamphlets, or other written materials from your doctor or pharmacy?: 1 - Never  Diabetic?no    Interpreter Needed?: No  Information entered by :: Renie Ora, LPN   Activities of Daily Living    02/15/2022    1:35 PM  In your present state of health, do you have any difficulty performing the following activities:  Hearing? 0  Vision? 0  Difficulty concentrating or making decisions? 0  Walking or climbing stairs? 0  Dressing or bathing? 0  Doing errands, shopping? 0  Preparing Food and eating ? N  Using the Toilet? N  In the past six months, have you accidently leaked urine? N  Do you have problems with loss of bowel control? N  Managing your Medications? N  Managing your Finances? N  Housekeeping or managing your Housekeeping? N    Patient Care Team: Georgina Quint, MD as PCP - General (Internal Medicine)  Indicate any recent Medical Services you may have received from other than [facility] providers in the past year (date may be approximate).     Assessment:   This is a routine wellness examination for Noah Ramsey.  Hearing/Vision screen Vision Screening - Comments:: Annual eye exams wear glasses   Dietary issues and exercise activities discussed: Current Exercise Habits: Home exercise routine, Type of exercise: walking, Time (Minutes): 30, Frequency (Times/Week): 3, Weekly Exercise (Minutes/Week):  90, Intensity: Mild, Exercise limited by: None identified   Goals Addressed   None    Depression Screen    02/15/2022    1:34 PM 11/06/2021    1:10 PM 05/31/2021    2:55 PM 11/30/2020    5:09 AM 01/18/2020    4:24 PM 06/03/2019    1:20 PM  PHQ 2/9 Scores  PHQ - 2 Score 0 0 5  0 0  Exception Documentation    Other- indicate reason in comment box    Not completed    Pt is currently experiencing delusions      Fall Risk    02/15/2022    1:32 PM 11/06/2021    1:10 PM 01/18/2020    4:24 PM 06/03/2019  1:20 PM  Fall Risk   Falls in the past year? 0 0 0 0  Number falls in past yr: 0     Injury with Fall? 0     Risk for fall due to : No Fall Risks     Follow up Falls prevention discussed  Falls evaluation completed Falls evaluation completed    FALL RISK PREVENTION PERTAINING TO THE HOME:  Any stairs in or around the home? Yes  If so, are there any without handrails? No  Home free of loose throw rugs in walkways, pet beds, electrical cords, etc? Yes  Adequate lighting in your home to reduce risk of falls? Yes   ASSISTIVE DEVICES UTILIZED TO PREVENT FALLS:  Life alert? No  Use of a cane, walker or w/c? No  Grab bars in the bathroom? No  Shower chair or bench in shower? No  Elevated toilet seat or a handicapped toilet? No   T       02/15/2022    1:36 PM  6CIT Screen  What Year? 0 points  What month? 0 points  What time? 0 points  Count back from 20 0 points  Months in reverse 0 points  Repeat phrase 0 points  Total Score 0 points    Immunizations  There is no immunization history on file for this patient.  TDAP status: Due, Education has been provided regarding the importance of this vaccine. Advised may receive this vaccine at local pharmacy or Health Dept. Aware to provide a copy of the vaccination record if obtained from local pharmacy or Health Dept. Verbalized acceptance and understanding.  Flu Vaccine status: Due, Education has been provided regarding the  importance of this vaccine. Advised may receive this vaccine at local pharmacy or Health Dept. Aware to provide a copy of the vaccination record if obtained from local pharmacy or Health Dept. Verbalized acceptance and understanding.  Pneumococcal vaccine status: Declined,  Education has been provided regarding the importance of this vaccine but patient still declined. Advised may receive this vaccine at local pharmacy or Health Dept. Aware to provide a copy of the vaccination record if obtained from local pharmacy or Health Dept. Verbalized acceptance and understanding.   Covid-19 vaccine status: Completed vaccines  Qualifies for Shingles Vaccine? No   Zostavax completed No   Shingrix Completed?: No.    Education has been provided regarding the importance of this vaccine. Patient has been advised to call insurance company to determine out of pocket expense if they have not yet received this vaccine. Advised may also receive vaccine at local pharmacy or Health Dept. Verbalized acceptance and understanding.  Screening Tests Health Maintenance  Topic Date Due   COVID-19 Vaccine (1) Never done   HPV VACCINES (1 - Male 2-dose series) Never done   INFLUENZA VACCINE  Never done   TETANUS/TDAP  05/09/2022 (Originally 01/12/2015)   Medicare Annual Wellness (AWV)  02/16/2023   Hepatitis C Screening  Completed   HIV Screening  Completed    Health Maintenance  Health Maintenance Due  Topic Date Due   COVID-19 Vaccine (1) Never done   HPV VACCINES (1 - Male 2-dose series) Never done   INFLUENZA VACCINE  Never done    Colorectal cancer screening: No longer required.   Lung Cancer Screening: (Low Dose CT Chest recommended if Age 79-80 years, 30 pack-year currently smoking OR have quit w/in 15years.) does not qualify.   Lung Cancer Screening Referral: n/a  Additional Screening:  Hepatitis C Screening: does  not qualify;   Vision Screening: Recommended annual ophthalmology exams for early  detection of glaucoma and other disorders of the eye. Is the patient up to date with their annual eye exam?  Yes  Who is the provider or what is the name of the office in which the patient attends annual eye exams? [facility]  If pt is not established with a provider, would they like to be referred to a provider to establish care? No .   Dental Screening: Recommended annual dental exams for proper oral hygiene  Community Resource Referral / Chronic Care Management: CRR required this visit?  No   CCM required this visit?  No      Plan:     I have personally reviewed and noted the following in the patient's chart:   Medical and social history Use of alcohol, tobacco or illicit drugs  Current medications and supplements including opioid prescriptions. Patient is not currently taking opioid prescriptions. Functional ability and status Nutritional status Physical activity Advanced directives List of other physicians Hospitalizations, surgeries, and ER visits in previous 12 months Vitals Screenings to include cognitive, depression, and falls Referrals and appointments  In addition, I have reviewed and discussed with patient certain preventive protocols, quality metrics, and best practice recommendations. A written personalized care plan for preventive services as well as general preventive health recommendations were provided to patient.     Lorrene Reid, LPN   16/01/9603   Nurse Notes: Due Flu Vaccine

## 2022-02-15 NOTE — Patient Instructions (Signed)
Noah Ramsey , Thank you for taking time to come for your Medicare Wellness Visit. I appreciate your ongoing commitment to your health goals. Please review the following plan we discussed and let me know if I can assist you in the future.   These are the goals we discussed:  Goals   None     This is a list of the screening recommended for you and due dates:  Health Maintenance  Topic Date Due   COVID-19 Vaccine (1) Never done   HPV Vaccine (1 - Male 2-dose series) Never done   Flu Shot  Never done   Tetanus Vaccine  05/09/2022*   Medicare Annual Wellness Visit  02/16/2023   Hepatitis C Screening: USPSTF Recommendation to screen - Ages 18-79 yo.  Completed   HIV Screening  Completed  *Topic was postponed. The date shown is not the original due date.    Advanced directives: Advance directive discussed with you today. I have provided a copy for you to complete at home and have notarized. Once this is complete please bring a copy in to our office so we can scan it into your chart.   Conditions/risks identified: Aim for 30 minutes of exercise or brisk walking, 6-8 glasses of water, and 5 servings of fruits and vegetables each day.   Next appointment: Follow up in one year for your annual wellness visit   Preventive Care 19-58 Years Old, Male Preventive care refers to lifestyle choices and visits with your health care provider that can promote health and wellness. Preventive care visits are also called wellness exams. What can I expect for my preventive care visit? Counseling During your preventive care visit, your health care provider may ask about your: Medical history, including: Past medical problems. Family medical history. Current health, including: Emotional well-being. Home life and relationship well-being. Sexual activity. Lifestyle, including: Alcohol, nicotine or tobacco, and drug use. Access to firearms. Diet, exercise, and sleep habits. Safety issues such as  seatbelt and bike helmet use. Sunscreen use. Work and work Astronomer. Physical exam Your health care provider may check your: Height and weight. These may be used to calculate your BMI (body mass index). BMI is a measurement that tells if you are at a healthy weight. Waist circumference. This measures the distance around your waistline. This measurement also tells if you are at a healthy weight and may help predict your risk of certain diseases, such as type 2 diabetes and high blood pressure. Heart rate and blood pressure. Body temperature. Skin for abnormal spots. What immunizations do I need? Vaccines are usually given at various ages, according to a schedule. Your health care provider will recommend vaccines for you based on your age, medical history, and lifestyle or other factors, such as travel or where you work. What tests do I need? Screening Your health care provider may recommend screening tests for certain conditions. This may include: Lipid and cholesterol levels. Diabetes screening. This is done by checking your blood sugar (glucose) after you have not eaten for a while (fasting). Hepatitis B test. Hepatitis C test. HIV (human immunodeficiency virus) test. STI (sexually transmitted infection) testing, if you are at risk. Talk with your health care provider about your test results, treatment options, and if necessary, the need for more tests. Follow these instructions at home: Eating and drinking  Eat a healthy diet that includes fresh fruits and vegetables, whole grains, lean protein, and low-fat dairy products. Drink enough fluid to keep your urine pale yellow. Take  vitamin and mineral supplements as recommended by your health care provider. Do not drink alcohol if your health care provider tells you not to drink. If you drink alcohol: Limit how much you have to 0-2 drinks a day. Know how much alcohol is in your drink. In the [place] one drink equals one 12 oz bottle of  beer (355 mL), one 5 oz glass of wine (148 mL), or one 1 oz glass of hard liquor (44 mL). Lifestyle Brush your teeth every morning and night with fluoride toothpaste. Floss one time each day. Exercise for at least 30 minutes 5 or more days each week. Do not use any products that contain nicotine or tobacco. These products include cigarettes, chewing tobacco, and vaping devices, such as e-cigarettes. If you need help quitting, ask your health care provider. Do not use drugs. If you are sexually active, practice safe sex. Use a condom or other form of protection to prevent STIs. Find healthy ways to manage stress, such as: Meditation, yoga, or listening to music. Journaling. Talking to a trusted person. Spending time with friends and family. Minimize exposure to UV radiation to reduce your risk of skin cancer. Safety Always wear your seat belt while driving or riding in a vehicle. Do not drive: If you have been drinking alcohol. Do not ride with someone who has been drinking. If you have been using any mind-altering substances or drugs. While texting. When you are tired or distracted. Wear a helmet and other protective equipment during sports activities. If you have firearms in your house, make sure you follow all gun safety procedures. Seek help if you have been physically or sexually abused. What's next? Go to your health care provider once a year for an annual wellness visit. Ask your health care provider how often you should have your eyes and teeth checked. Stay up to date on all vaccines. This information is not intended to replace advice given to you by your health care provider. Make sure you discuss any questions you have with your health care provider. Document Revised: 10/04/2020 Document Reviewed: 10/04/2020 Elsevier Patient Education  [place].

## 2022-03-11 DIAGNOSIS — H18622 Keratoconus, unstable, left eye: Secondary | ICD-10-CM | POA: Diagnosis not present

## 2022-03-13 ENCOUNTER — Ambulatory Visit (HOSPITAL_COMMUNITY)
Admission: EM | Admit: 2022-03-13 | Discharge: 2022-03-13 | Disposition: A | Discharge status: Home / Self Care | Payer: Medicare Other | Attending: Psychiatry | Admitting: Psychiatry

## 2022-03-13 DIAGNOSIS — L648 Other androgenic alopecia: Secondary | ICD-10-CM | POA: Diagnosis not present

## 2022-03-13 DIAGNOSIS — L2089 Other atopic dermatitis: Secondary | ICD-10-CM | POA: Diagnosis not present

## 2022-03-13 DIAGNOSIS — R45851 Suicidal ideations: Secondary | ICD-10-CM | POA: Insufficient documentation

## 2022-03-13 DIAGNOSIS — F200 Paranoid schizophrenia: Secondary | ICD-10-CM | POA: Diagnosis not present

## 2022-03-13 DIAGNOSIS — Z79899 Other long term (current) drug therapy: Secondary | ICD-10-CM | POA: Insufficient documentation

## 2022-03-13 DIAGNOSIS — L810 Postinflammatory hyperpigmentation: Secondary | ICD-10-CM | POA: Diagnosis not present

## 2022-03-13 NOTE — BH Assessment (Signed)
Clinician spoke with Netty Starring with [facility].  She reports patient was just seen by their provider yesterday and reported only hearing voices in his sleep.  He stated he believed the medications were working well for him and he had no other concerns at the time.  She reviewed patient's chart and noticed he was out of town and missed his last scheduled [place] injection.  She states he did not receive the injection yesterday when he saw Dr. Guss Bunde.  She states he will be starting [employer] next month.  Patient should have the [place] injection either at this home or his pharmacy.  Netty Starring states they will ensure patient gets a dose of [place] this weekend at some point.  He will then be transitioning to the [place] next month.

## 2022-03-13 NOTE — Discharge Instructions (Signed)

## 2022-03-13 NOTE — ED Provider Notes (Signed)
Behavioral Health Urgent Care Medical Screening Exam  Patient Name: Noah Ramsey MRN: [id number] Date of Evaluation: 03/13/22 Chief Complaint:   Diagnosis:  Final diagnoses:  Paranoid schizophrenia ([place])    History of Present illness: Noah Ramsey is a 26 y.o. male. Patient presents voluntarily to [facility] behavioral health for walk-in assessment.  Patient is assessed, face-to-face, by nurse practitioner, seated in assessment area, no acute distress.  He  is alert and oriented. Pleasant and cooperative during assessment.   Patient  presents with euthymic mood, congruent affect. He  denies suicidal and homicidal ideations. Patient easily  contracts verbally for safety with this Probation officer.  Noah Ramsey reports his has began experiencing auditory hallucinations first thing in the morning, intermittently, for two weeks. He reports auditory hallucinations that are "random, like, the sky is orange or should I get my hair done and where." No current AH, denies command hallucinations.   Patient is insightful he reports he recently transition from [place] to [place].  He is followed by [facility] team, met with Dr. Franchot Mimes, on yesterday.  He is scheduled to have his Invega injection on Saturday, 03/16/2022.  He will discuss resuming [place] as he believes this medication was more effective.  He is compliant with her medications including Depakote 500 mg daily and Wellbutrin 150 mg daily.  Noah Ramsey began using nicotine via vape several weeks ago.  Wellbutrin initiated 2 weeks ago after patient "felt the constant need to smoke."  He reports now decreased feelings of nicotine craving.  He states "now I have good control of my anxious thoughts."  Medical record reviewed on 03/13/2022. Patient has been diagnosed with paranoid schizophrenia, auditory hallucinations, suicidal ideation and psychosis.  He endorses history of multiple previous inpatient psychiatric  hospitalizations.  No family mental health history reported.    Patient has normal speech and behavior.  He  denies auditory and visual hallucinations currently.  Patient is able to converse coherently with goal-directed thoughts and no distractibility or preoccupation.  Denies symptoms of paranoia.  Objectively there is no evidence of psychosis/mania or delusional thinking.  Noah Ramsey resides in [place] with his mother, stepfather and his older brother.  He denies access to weapons.  He receives disability income, enjoys watching television and going to the gym. He endorses rare alcohol use, less than one drink per month. He denies substance use aside from rare alcohol use. Patient endorses average sleep and appetite.  Patient offered support and encouragement. He gives verbal consent to speak with his mother, Noah Ramsey [phone]. Spoke with patient's mother who denies safety concerns. Agrees with plan to follow up with established ACT team.    Patient and family are educated and verbalize understanding of mental health resources and other crisis services in the community. They are instructed to call 911 and present to the nearest emergency room should patient experience any suicidal/homicidal ideation, auditory/visual/hallucinations, or detrimental worsening of mental health condition.      [place] from 08/28/2021 in [place] [facility] from 04/16/2021 in [facility]PT ED from 02/17/2021 in [place] CATEGORY Moderate Risk No Risk Error: Q3, 4, or 5 should not be populated when Q2 is No       Psychiatric Specialty Exam  Presentation  General Appearance:Appropriate for Environment; Casual  Eye Contact:Good  Speech:Clear and Coherent; Normal Rate  Speech Volume:Normal  Handedness:Right   Mood and Affect  Mood: Euthymic  Affect: Appropriate;  Congruent   Thought Process  Thought Processes: Coherent; Goal Directed; Linear  Descriptions of Associations:Intact  Orientation:Full (Time, Place and Person)  Thought Content:Logical; WDL  Diagnosis of Schizophrenia or Schizoaffective disorder in past: No data recorded Duration of Psychotic Symptoms: No data recorded Hallucinations:None (AH morning upon awakening) Reports he is hearing whispers telling him to harm himself.  State better today.  Whispers never go away  Ideas of Reference:None  Suicidal Thoughts:No  Homicidal Thoughts:No   Sensorium  Memory: Immediate Good; Recent Good  Judgment: Good  Insight: Fair   Executive Functions  Concentration: Good  Attention Span: Good  Recall: Good  Fund of Knowledge: Good  Language: Good   Psychomotor Activity  Psychomotor Activity: Normal   Assets  Assets: Communication Skills; Desire for Improvement; Financial Resources/Insurance; Housing; Resilience; Social Support   Sleep  Sleep: Good  Number of hours: No data recorded  No data recorded  Physical Exam: Physical Exam Vitals and nursing note reviewed.  Constitutional:      Appearance: Normal appearance. He is well-developed. He is obese.  HENT:     Head: Normocephalic and atraumatic.     Nose: Nose normal.  Cardiovascular:     Rate and Rhythm: Normal rate.  Pulmonary:     Effort: Pulmonary effort is normal.  Musculoskeletal:        General: Normal range of motion.     Cervical back: Normal range of motion.  Skin:    General: Skin is warm and dry.  Neurological:     Mental Status: He is alert and oriented to person, place, and time.  Psychiatric:        Attention and Perception: Attention and perception normal.        Mood and Affect: Mood and affect normal.        Speech: Speech normal.        Behavior: Behavior normal. Behavior is cooperative.        Thought Content: Thought content normal.        Cognition and Memory:  Cognition and memory normal.    Review of Systems  Constitutional: Negative.   HENT: Negative.    Eyes: Negative.   Respiratory: Negative.    Cardiovascular: Negative.   Gastrointestinal: Negative.   Genitourinary: Negative.   Musculoskeletal: Negative.   Skin: Negative.   Neurological: Negative.   Psychiatric/Behavioral: Negative.     Blood pressure 139/82, pulse 100, temperature 98.1 F (36.7 C), temperature source Oral, resp. rate 18, SpO2 96 %. There is no height or weight on file to calculate BMI.  Musculoskeletal: Strength & Muscle Tone: within normal limits Gait & Station: normal Patient leans: N/A   [facility] Discharge Disposition for Follow up and Recommendations: Based on my evaluation I certify that psychiatric inpatient services furnished can reasonably be expected to improve the patient's condition which I recommend transfer to an appropriate accepting facility.  Patient reviewed with Dr Cloyde Reams [place]. Follow up with [facility] ACT team, continue current medications. [facility] ACT team will visit Noah Ramsey in his home on Saturday 03/16/2022.   Lucky Rathke, FNP 03/13/2022, 6:35 PM

## 2022-03-13 NOTE — Progress Notes (Signed)
   03/13/22 1745  [facility] Triage Screening (Walk-ins at [facility] only)  What Is the Reason for Your Visit/Call Today? He presents requesting evaluation due to concerns he is having more AH, stating he hears random male and male voices in the mornings.  They are non-command type, and often discuss "where they are getting their hair done or where car will be fixed."  He denies SI or HI.  He is followed by [facility] and compliant with medications.  He shares he is concerned he "may relapse, so I am here to see if there is anything you can do to help prevent that."  He reports hx of multiple inpatient admissions last year and he is afraid of decompensating and needing admission.  He denies recent substance use.  How Long Has This Been Causing You Problems? 1 wk - 1 month  Have You Recently Had Any Thoughts About Hurting Yourself? No  Are You Planning to Commit Suicide/Harm Yourself At This time? No  Have you Recently Had Thoughts About Hurting Someone [name]? No  Are You Planning To Harm Someone At This Time? No  Are you currently experiencing any auditory, visual or other hallucinations? Yes  Please explain the hallucinations you are currently experiencing: Hearing voices in the mornings, telling him about where they plan to get their hair done or their car fixed. No command hallucinations.  Have You Used Any Alcohol or Drugs in the Past 24 Hours? No  Do you have any current medical co-morbidities that require immediate attention? No  Clinician description of patient physical appearance/behavior: Patient is calm, cooperative, pleasant AAOx5  What Do You Feel Would Help You the Most Today? Medication(s);Treatment for Depression or other mood problem  If access to [facility] was not available, would you have sought care in the Emergency Department? No  Determination of Need Routine (7 days)  Options For Referral Medication Management;Outpatient Therapy

## 2022-05-06 DIAGNOSIS — L209 Atopic dermatitis, unspecified: Secondary | ICD-10-CM | POA: Diagnosis not present

## 2022-05-29 ENCOUNTER — Ambulatory Visit: Payer: 59 | Admitting: Emergency Medicine

## 2022-05-30 ENCOUNTER — Ambulatory Visit: Payer: 59 | Admitting: Emergency Medicine

## 2022-06-03 ENCOUNTER — Emergency Department (HOSPITAL_COMMUNITY)
Admission: EM | Admit: 2022-06-03 | Discharge: 2022-06-03 | Disposition: A | Discharge status: Home / Self Care | Payer: 59 | Attending: Student | Admitting: Student

## 2022-06-03 ENCOUNTER — Other Ambulatory Visit: Payer: Self-pay

## 2022-06-03 DIAGNOSIS — R000 Tachycardia, unspecified: Secondary | ICD-10-CM | POA: Diagnosis not present

## 2022-06-03 DIAGNOSIS — R040 Epistaxis: Secondary | ICD-10-CM | POA: Diagnosis not present

## 2022-06-03 DIAGNOSIS — R748 Abnormal levels of other serum enzymes: Secondary | ICD-10-CM | POA: Diagnosis not present

## 2022-06-03 DIAGNOSIS — Z7984 Long term (current) use of oral hypoglycemic drugs: Secondary | ICD-10-CM | POA: Insufficient documentation

## 2022-06-03 LAB — BASIC METABOLIC PANEL
Anion gap: 9 (ref 5–15)
BUN: 12 mg/dL (ref 6–20)
CO2: 26 mmol/L (ref 22–32)
Calcium: 9.1 mg/dL (ref 8.9–10.3)
Chloride: 101 mmol/L (ref 98–111)
Creatinine, Ser: 0.99 mg/dL (ref 0.61–1.24)
GFR, Estimated: >60 mL/min (ref >60)
Glucose, Bld: 91 mg/dL (ref 70–99)
Potassium: 3.8 mmol/L (ref 3.5–5.1)
Sodium: 136 mmol/L (ref 135–145)

## 2022-06-03 LAB — CBC
HCT: 41.9 % (ref 39.0–52.0)
Hemoglobin: 14.2 g/dL (ref 13.0–17.0)
MCH: 31.6 pg (ref 26.0–34.0)
MCHC: 33.9 g/dL (ref 30.0–36.0)
MCV: 93.1 fL (ref 80.0–100.0)
Platelets: 205 10*3/uL (ref 150–400)
RBC: 4.5 MIL/uL (ref 4.22–5.81)
RDW: 13.6 % (ref 11.5–15.5)
WBC: 5.1 10*3/uL (ref 4.0–10.5)
nRBC: 0 % (ref 0.0–0.2)

## 2022-06-03 LAB — CK: Total CK: 1796 U/L — ABNORMAL HIGH (ref 49–397)

## 2022-06-03 NOTE — ED Triage Notes (Signed)
Nosebleed to right nare since this AM. No active bleeding at this time. Alert and oriented x 4. Pt is able to talk in full sentences with no difficulties.

## 2022-06-03 NOTE — Discharge Instructions (Addendum)
You were evaluated today due to a nosebleed.  It is unclear what caused the nosebleed.  Please avoid using your finger or anything else inside your nose as this is the most common cause of nosebleeds.  Nosebleeds can also be caused by dry air if the area in your house is dry consider adding a humidifier.  If you experience further nosebleeds please buy Afrin (over-the-counter nose spray) and spray 2 sprays into the affected nostril.  Hold pressure over the soft portion of your nose for 15 minutes.  You may try this twice if needed.  Do not blow your nose or stop holding pressure to check on the bleed.  If it continues to bleed after using Afrin please return to the emergency department, urgent care, or primary care for further evaluation and management. Please follow-up with your primary care to discuss your elevated CK levels.  Please be sure to drink plenty of water

## 2022-06-03 NOTE — ED Provider Notes (Signed)
[facility] Provider Note   CSN: [id number] Arrival date & time: 06/03/22  0841     History  Chief Complaint  Patient presents with   Epistaxis    Noah Ramsey is a 27 y.o. male.  Patient presents to the emergency department complaining of a nosebleed to the right nare which began this morning.  Patient states she was in a videogame and felt his nose running.  He states that when he touched it he noticed it was blood.  There is no active bleeding at this time.  Patient has past medical history significant for schizophrenia, morbid obesity, prediabetes  HPI     Home Medications Prior to Admission medications   Medication Sig Start Date End Date Taking? Authorizing Provider  benztropine (COGENTIN) 0.5 MG tablet Take 0.5 mg by mouth every 6 (six) hours as needed for tremors.    [provider]  buPROPion (WELLBUTRIN XL) 150 MG 24 hr tablet Take 150 mg by mouth every morning. 10/18/21   [provider]  hydrOXYzine (ATARAX/VISTARIL) 25 MG tablet Take 25 mg by mouth every 6 (six) hours as needed for anxiety.    [provider]  INVEGA SUSTENNA 156 MG/ML SUSY injection Inject into the muscle. 03/09/21   [provider]  metFORMIN (GLUCOPHAGE) 500 MG tablet Take 1 tablet (500 mg total) by mouth 2 (two) times daily with a meal. :Medication induced wt management 01/05/21   Lindell Spar I, NP  Semaglutide,0.25 or 0.5MG/DOS, (OZEMPIC, 0.25 OR 0.5 MG/DOSE,) 2 MG/1.5ML SOPN Inject 0.5 mg into the skin once a week. 05/31/21   Horald Pollen, MD      Allergies    Patient has no known allergies.    Review of Systems   Review of Systems  HENT:  Positive for nosebleeds.     Physical Exam Updated Vital Signs BP (!) 141/90 (BP Location: Right Arm)   Pulse (!) 120   Temp (!) 96.8 F (36 C) (Temporal)   Resp 15   SpO2 100%  Physical Exam HENT:     Head: Normocephalic and atraumatic.     Nose:      Comments: Dried blood around right nare.  No septal hematoma noted.  No active bleeding noted. Eyes:     Conjunctiva/sclera: Conjunctivae normal.  Cardiovascular:     Rate and Rhythm: Tachycardia present.  Pulmonary:     Effort: Pulmonary effort is normal. No respiratory distress.  Musculoskeletal:        General: No signs of injury.     Cervical back: Normal range of motion.  Skin:    General: Skin is dry.  Neurological:     Mental Status: He is alert.  Psychiatric:        Speech: Speech normal.        Behavior: Behavior normal.     ED Results / Procedures / Treatments   Labs (all labs ordered are listed, but only abnormal results are displayed) Labs Reviewed  CK - Abnormal; Notable for the following components:      Result Value   Total CK 1,796 (*)    All other components within normal limits  CBC  BASIC METABOLIC PANEL    EKG None  Radiology No results found.  Procedures Procedures    Medications Ordered in ED Medications - No data to display  ED Course/ Medical Decision Making/ A&P  Medical Decision Making Amount and/or Complexity of Data Reviewed Labs: ordered.   Patient presents with chief complaint of epistaxis.  There is no active bleeding at this time  I reviewed the patient's past medical history.  This included vital signs from a primary care visit in July of this past year.  At that routine checkup his heart rate was also elevated at 116.  The patient does endorse having an elevated heart rate due to medications he is on.  He is not complaining of shortness of breath, chest pain, fever, or other tachycardia related symptoms at this time  The patient's mother was concerned about routine lab work and CK levels as the patient has a chronically elevated CK level.  CK today was 1796 (CK chronically elevated over 1000, as high as 8600 in July 2022), CBC and BMP unremarkable  There is no indication for intervention as the  patient has no active bleed at this time. Tachycardia appears to be baseline for patient. Patient instructed on Afrin use in case of future nosebleeds.  Patient and his mother advised to follow-up with primary care for continued evaluation of chronically elevated CK levels.  No recent falls or activity to suggest acute rhabdomyolysis.  Patient instructed to drink plenty of water.  Return precautions provided including intractable nosebleed        Final Clinical Impression(s) / ED Diagnoses Final diagnoses:  Epistaxis  Elevated CK    Rx / DC Orders ED Discharge Orders     None         Ronny Bacon 06/03/22 1133    K[place], [place], MD 06/03/22 [phone]

## 2022-06-12 ENCOUNTER — Ambulatory Visit (INDEPENDENT_AMBULATORY_CARE_PROVIDER_SITE_OTHER): Payer: 59 | Admitting: Emergency Medicine

## 2022-06-12 ENCOUNTER — Encounter: Payer: Self-pay | Admitting: Emergency Medicine

## 2022-06-12 DIAGNOSIS — R7303 Prediabetes: Secondary | ICD-10-CM

## 2022-06-12 DIAGNOSIS — Z23 Encounter for immunization: Secondary | ICD-10-CM

## 2022-06-12 LAB — CBC WITH DIFFERENTIAL/PLATELET
Basophils Absolute: 0 10*3/uL (ref 0.0–0.1)
Basophils Relative: 0.4 % (ref 0.0–3.0)
Eosinophils Absolute: 0.1 10*3/uL (ref 0.0–0.7)
Eosinophils Relative: 2.3 % (ref 0.0–5.0)
HCT: 44.7 % (ref 39.0–52.0)
Hemoglobin: 14.7 g/dL (ref 13.0–17.0)
Lymphocytes Relative: 37.1 % (ref 12.0–46.0)
Lymphs Abs: 1.8 10*3/uL (ref 0.7–4.0)
MCHC: 33 g/dL (ref 30.0–36.0)
MCV: 93.1 fl (ref 78.0–100.0)
Monocytes Absolute: 0.4 10*3/uL (ref 0.1–1.0)
Monocytes Relative: 7.4 % (ref 3.0–12.0)
Neutro Abs: 2.5 10*3/uL (ref 1.4–7.7)
Neutrophils Relative %: 52.8 % (ref 43.0–77.0)
Platelets: 242 10*3/uL (ref 150.0–400.0)
RBC: 4.8 Mil/uL (ref 4.22–5.81)
RDW: 14.1 % (ref 11.5–15.5)
WBC: 4.8 10*3/uL (ref 4.0–10.5)

## 2022-06-12 LAB — HEMOGLOBIN A1C: Hgb A1c MFr Bld: 6.2 % (ref 4.6–6.5)

## 2022-06-12 NOTE — Assessment & Plan Note (Signed)
Diet and nutrition discussed. Advised to decrease amount of daily carbohydrate intake and daily calories and increase amount of plant-based protein in his diet. Continue metformin 500 mg twice a day and weekly Ozempic 0.5 mg Cardiovascular risks associated with morbid obesity discussed. Good candidate for bariatric surgery.

## 2022-06-12 NOTE — Patient Instructions (Signed)
Calorie Counting for Weight Loss Calories are units of energy. Your body needs a certain number of calories from food to keep going throughout the day. When you eat or drink more calories than your body needs, your body stores the extra calories mostly as fat. When you eat or drink fewer calories than your body needs, your body burns fat to get the energy it needs. Calorie counting means keeping track of how many calories you eat and drink each day. Calorie counting can be helpful if you need to lose weight. If you eat fewer calories than your body needs, you should lose weight. Ask your health care provider what a healthy weight is for you. For calorie counting to work, you will need to eat the right number of calories each day to lose a healthy amount of weight per week. A dietitian can help you figure out how many calories you need in a day and will suggest ways to reach your calorie goal. A healthy amount of weight to lose each week is usually 1-2 lb (0.5-0.9 kg). This usually means that your daily calorie intake should be reduced by 500-750 calories. Eating 1,200-1,500 calories a day can help most women lose weight. Eating 1,500-1,800 calories a day can help most men lose weight. What do I need to know about calorie counting? Work with your health care provider or dietitian to determine how many calories you should get each day. To meet your daily calorie goal, you will need to: Find out how many calories are in each food that you would like to eat. Try to do this before you eat. Decide how much of the food you plan to eat. Keep a food log. Do this by writing down what you ate and how many calories it had. To successfully lose weight, it is important to balance calorie counting with a healthy lifestyle that includes regular activity. Where do I find calorie information?  The number of calories in a food can be found on a Nutrition Facts label. If a food does not have a Nutrition Facts label, try  to look up the calories online or ask your dietitian for help. Remember that calories are listed per serving. If you choose to have more than one serving of a food, you will have to multiply the calories per serving by the number of servings you plan to eat. For example, the label on a package of bread might say that a serving size is 1 slice and that there are 90 calories in a serving. If you eat 1 slice, you will have eaten 90 calories. If you eat 2 slices, you will have eaten 180 calories. How do I keep a food log? After each time that you eat, record the following in your food log as soon as possible: What you ate. Be sure to include toppings, sauces, and other extras on the food. How much you ate. This can be measured in cups, ounces, or number of items. How many calories were in each food and drink. The total number of calories in the food you ate. Keep your food log near you, such as in a pocket-sized notebook or on an app or website on your mobile phone. Some programs will calculate calories for you and show you how many calories you have left to meet your daily goal. What are some portion-control tips? Know how many calories are in a serving. This will help you know how many servings you can have of a certain   food. Use a measuring cup to measure serving sizes. You could also try weighing out portions on a kitchen scale. With time, you will be able to estimate serving sizes for some foods. Take time to put servings of different foods on your favorite plates or in your favorite bowls and cups so you know what a serving looks like. Try not to eat straight from a food's packaging, such as from a bag or box. Eating straight from the package makes it hard to see how much you are eating and can lead to overeating. Put the amount you would like to eat in a cup or on a plate to make sure you are eating the right portion. Use smaller plates, glasses, and bowls for smaller portions and to prevent  overeating. Try not to multitask. For example, avoid watching TV or using your computer while eating. If it is time to eat, sit down at a table and enjoy your food. This will help you recognize when you are full. It will also help you be more mindful of what and how much you are eating. What are tips for following this plan? Reading food labels Check the calorie count compared with the serving size. The serving size may be smaller than what you are used to eating. Check the source of the calories. Try to choose foods that are high in protein, fiber, and vitamins, and low in saturated fat, trans fat, and sodium. Shopping Read nutrition labels while you shop. This will help you make healthy decisions about which foods to buy. Pay attention to nutrition labels for low-fat or fat-free foods. These foods sometimes have the same number of calories or more calories than the full-fat versions. They also often have added sugar, starch, or salt to make up for flavor that was removed with the fat. Make a grocery list of lower-calorie foods and stick to it. Cooking Try to cook your favorite foods in a healthier way. For example, try baking instead of frying. Use low-fat dairy products. Meal planning Use more fruits and vegetables. One-half of your plate should be fruits and vegetables. Include lean proteins, such as chicken, turkey, and fish. Lifestyle Each week, aim to do one of the following: 150 minutes of moderate exercise, such as walking. 75 minutes of vigorous exercise, such as running. General information Know how many calories are in the foods you eat most often. This will help you calculate calorie counts faster. Find a way of tracking calories that works for you. Get creative. Try different apps or programs if writing down calories does not work for you. What foods should I eat?  Eat nutritious foods. It is better to have a nutritious, high-calorie food, such as an avocado, than a food with  few nutrients, such as a bag of potato chips. Use your calories on foods and drinks that will fill you up and will not leave you hungry soon after eating. Examples of foods that fill you up are nuts and nut butters, vegetables, lean proteins, and high-fiber foods such as whole grains. High-fiber foods are foods with more than 5 g of fiber per serving. Pay attention to calories in drinks. Low-calorie drinks include water and unsweetened drinks. The items listed above may not be a complete list of foods and beverages you can eat. Contact a dietitian for more information. What foods should I limit? Limit foods or drinks that are not good sources of vitamins, minerals, or protein or that are high in unhealthy fats. These   include: Candy. Other sweets. Sodas, specialty coffee drinks, alcohol, and juice. The items listed above may not be a complete list of foods and beverages you should avoid. Contact a dietitian for more information. How do I count calories when eating out? Pay attention to portions. Often, portions are much larger when eating out. Try these tips to keep portions smaller: Consider sharing a meal instead of getting your own. If you get your own meal, eat only half of it. Before you start eating, ask for a container and put half of your meal into it. When available, consider ordering smaller portions from the menu instead of full portions. Pay attention to your food and drink choices. Knowing the way food is cooked and what is included with the meal can help you eat fewer calories. If calories are listed on the menu, choose the lower-calorie options. Choose dishes that include vegetables, fruits, whole grains, low-fat dairy products, and lean proteins. Choose items that are boiled, broiled, grilled, or steamed. Avoid items that are buttered, battered, fried, or served with cream sauce. Items labeled as crispy are usually fried, unless stated otherwise. Choose water, low-fat milk,  unsweetened iced tea, or other drinks without added sugar. If you want an alcoholic beverage, choose a lower-calorie option, such as a glass of wine or light beer. Ask for dressings, sauces, and syrups on the side. These are usually high in calories, so you should limit the amount you eat. If you want a salad, choose a garden salad and ask for grilled meats. Avoid extra toppings such as bacon, cheese, or fried items. Ask for the dressing on the side, or ask for olive oil and vinegar or lemon to use as dressing. Estimate how many servings of a food you are given. Knowing serving sizes will help you be aware of how much food you are eating at restaurants. Where to find more information Centers for Disease Control and Prevention: www.cdc.gov [place] Department [employer]: myplate.gov Summary Calorie counting means keeping track of how many calories you eat and drink each day. If you eat fewer calories than your body needs, you should lose weight. A healthy amount of weight to lose per week is usually 1-2 lb (0.5-0.9 kg). This usually means reducing your daily calorie intake by 500-750 calories. The number of calories in a food can be found on a Nutrition Facts label. If a food does not have a Nutrition Facts label, try to look up the calories online or ask your dietitian for help. Use smaller plates, glasses, and bowls for smaller portions and to prevent overeating. Use your calories on foods and drinks that will fill you up and not leave you hungry shortly after a meal. This information is not intended to replace advice given to you by your health care provider. Make sure you discuss any questions you have with your health care provider. Document Revised: 05/20/2019 Document Reviewed: 05/20/2019 [employer] Patient Education  2023 [employer]  

## 2022-06-12 NOTE — Progress Notes (Signed)
Noah Ramsey 27 y.o.   Chief Complaint  Patient presents with   Follow-up    Patient wants to discuss bariatric surgery, patient want to get labs done     HISTORY OF PRESENT ILLNESS: This is a 27 y.o. male morbidly obese interested in bariatric surgery. Was recently seen by [facility] for this.  Waiting on insurance's approval. Requesting labs. Has been unable to lose weight despite exercise, nutrition, and medication including weekly Ozempic and daily metformin.   No other complaints or medical concerns today.   HPI   Prior to Admission medications   Medication Sig Start Date End Date Taking? Authorizing Provider  INVEGA TRINZA 546 MG/1.75ML injection  01/21/22  Yes [provider]  Semaglutide,0.25 or 0.5MG/DOS, (OZEMPIC, 0.25 OR 0.5 MG/DOSE,) 2 MG/1.5ML SOPN Inject 0.5 mg into the skin once a week. 05/31/21  Yes Fariha Goto, Ines Bloomer, MD  metFORMIN (GLUCOPHAGE) 500 MG tablet Take 1 tablet (500 mg total) by mouth 2 (two) times daily with a meal. 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  Social History   Socioeconomic History   Marital status: Single    Spouse name: Not on file   Number of children: Not on file   Years of education: Not on file   Highest education  level: Not on file  Occupational History   Not on file  Tobacco Use   Smoking status: Never   Smokeless tobacco: Never  Substance and Sexual Activity   Alcohol use: Never   Drug use: Never   Sexual activity: Not on file  Other Topics Concern   Not on file  Social History Narrative   Not on file   Social Determinants of Health   Financial Resource Strain: Low Risk  (02/15/2022)   Overall Financial Resource Strain (CARDIA)    Difficulty of Paying Living Expenses: Not hard at all  Food Insecurity: No Food Insecurity (02/15/2022)   Hunger Vital Sign    Worried About Running Out of Food in the Last Year: Never true    [place] in the Last Year: Never true  Transportation Needs: No Transportation Needs (02/15/2022)   PRAPARE - Hydrologist (Medical): No    Lack of Transportation (Non-Medical): No  Physical Activity: Insufficiently Active (02/15/2022)   Exercise Vital Sign    Days of Exercise per Week: 3 days    Minutes of Exercise per Session: 30 min  Stress: No Stress Concern Present (02/15/2022)   [place]    Feeling of Stress : Not at all  Social Connections: Moderately Isolated (02/15/2022)   Social Connection and Isolation Panel [NHANES]    Frequency of Communication with Friends and Family: More than three times  a week    Frequency of Social Gatherings with Friends and Family: More than three times a week    Attends Religious Services: More than 4 times per year    Active Member of [employer] or Organizations: No    Attends Archivist Meetings: Never    Marital Status: Never married  Intimate Partner Violence: Not At Risk (02/15/2022)   Humiliation, Afraid, Rape, and Kick questionnaire    Fear of Current or Ex-Partner: No    Emotionally Abused: No    Physically Abused: No    Sexually Abused: No    Family History  Problem Relation Age of Onset   Mental  illness Brother      Review of Systems  Constitutional: Negative.  Negative for chills and fever.  HENT: Negative.  Negative for congestion and sore throat.   Respiratory: Negative.  Negative for cough and shortness of breath.   Cardiovascular: Negative.  Negative for chest pain and palpitations.  Gastrointestinal:  Negative for abdominal pain, nausea and vomiting.  Genitourinary: Negative.   Skin: Negative.  Negative for rash.  Neurological:  Negative for dizziness and headaches.  All other systems reviewed and are negative.  Today's Vitals   06/12/22 1355  BP: 124/80  Pulse: (!) 108  Temp: 98.7 F (37.1 C)  TempSrc: Oral  SpO2: 97%  Weight: (!) 358 lb 8 oz (162.6 kg)  Height: 5' 7"$  (1.702 m)   Body mass index is 56.15 kg/m.   Physical Exam Vitals reviewed.  Constitutional:      Appearance: He is obese.  HENT:     Head: Normocephalic.  Eyes:     Extraocular Movements: Extraocular movements intact.     Pupils: Pupils are equal, round, and reactive to light.  Cardiovascular:     Rate and Rhythm: Normal rate and regular rhythm.     Pulses: Normal pulses.     Heart sounds: Normal heart sounds.  Pulmonary:     Effort: Pulmonary effort is normal.     Breath sounds: Normal breath sounds.  Skin:    General: Skin is warm and dry.     Capillary Refill: Capillary refill takes less than 2 seconds.  Neurological:     General: No focal deficit present.     Mental Status: He is alert and oriented to person, place, and time.  Psychiatric:        Mood and Affect: Mood normal.        Behavior: Behavior normal.      ASSESSMENT & PLAN: Problem List Items Addressed This Visit       Other   Prediabetes   Relevant Orders   Hemoglobin A1c   Morbid obesity ([place]) - Primary    Diet and nutrition discussed. Advised to decrease amount of daily carbohydrate intake and daily calories and increase amount of plant-based protein in his diet. Continue metformin 500 mg twice a day  and weekly Ozempic 0.5 mg Cardiovascular risks associated with morbid obesity discussed. Good candidate for bariatric surgery.      Relevant Orders   CBC with Differential/Platelet   Comprehensive metabolic panel   Lipid panel   Hemoglobin A1c   Other Visit Diagnoses     Need for vaccination       Relevant Orders   Flu Vaccine QUAD 6+ mos PF IM (Fluarix Quad PF) (Completed)      Patient Instructions  Calorie Counting for Weight Loss Calories are units of energy. Your body needs a certain number  of calories from food to keep going throughout the day. When you eat or drink more calories than your body needs, your body stores the extra calories mostly as fat. When you eat or drink fewer calories than your body needs, your body burns fat to get the energy it needs. Calorie counting means keeping track of how many calories you eat and drink each day. Calorie counting can be helpful if you need to lose weight. If you eat fewer calories than your body needs, you should lose weight. Ask your health care provider what a healthy weight is for you. For calorie counting to work, you will need to eat the right number of calories each day to lose a healthy amount of weight per week. A dietitian can help you figure out how many calories you need in a day and will suggest ways to reach your calorie goal. A healthy amount of weight to lose each week is usually 1-2 lb (0.5-0.9 kg). This usually means that your daily calorie intake should be reduced by 500-750 calories. Eating 1,200-1,500 calories a day can help most women lose weight. Eating 1,500-1,800 calories a day can help most men lose weight. What do I need to know about calorie counting? Work with your health care provider or dietitian to determine how many calories you should get each day. To meet your daily calorie goal, you will need to: Find out how many calories are in each food that you would like to eat. Try to do this before you  eat. Decide how much of the food you plan to eat. Keep a food log. Do this by writing down what you ate and how many calories it had. To successfully lose weight, it is important to balance calorie counting with a healthy lifestyle that includes regular activity. Where do I find calorie information?  The number of calories in a food can be found on a Nutrition Facts label. If a food does not have a Nutrition Facts label, try to look up the calories online or ask your dietitian for help. Remember that calories are listed per serving. If you choose to have more than one serving of a food, you will have to multiply the calories per serving by the number of servings you plan to eat. For example, the label on a package of bread might say that a serving size is 1 slice and that there are 90 calories in a serving. If you eat 1 slice, you will have eaten 90 calories. If you eat 2 slices, you will have eaten 180 calories. How do I keep a food log? After each time that you eat, record the following in your food log as soon as possible: What you ate. Be sure to include toppings, sauces, and other extras on the food. How much you ate. This can be measured in cups, ounces, or number of items. How many calories were in each food and drink. The total number of calories in the food you ate. Keep your food log near you, such as in a pocket-sized notebook or on an app or website on your mobile phone. Some programs will calculate calories for you and show you how many calories you have left to meet your daily goal. What are some portion-control tips? Know how many calories are in a serving. This will help you know how many servings you can have of a certain food. Use a measuring cup to measure serving sizes. You could also try weighing out portions  on a kitchen scale. With time, you will be able to estimate serving sizes for some foods. Take time to put servings of different foods on your favorite plates or in your  favorite bowls and cups so you know what a serving looks like. Try not to eat straight from a food's packaging, such as from a bag or box. Eating straight from the package makes it hard to see how much you are eating and can lead to overeating. Put the amount you would like to eat in a cup or on a plate to make sure you are eating the right portion. Use smaller plates, glasses, and bowls for smaller portions and to prevent overeating. Try not to multitask. For example, avoid watching TV or using your computer while eating. If it is time to eat, sit down at a table and enjoy your food. This will help you recognize when you are full. It will also help you be more mindful of what and how much you are eating. What are tips for following this plan? Reading food labels Check the calorie count compared with the serving size. The serving size may be smaller than what you are used to eating. Check the source of the calories. Try to choose foods that are high in protein, fiber, and vitamins, and low in saturated fat, trans fat, and sodium. Shopping Read nutrition labels while you shop. This will help you make healthy decisions about which foods to buy. Pay attention to nutrition labels for low-fat or fat-free foods. These foods sometimes have the same number of calories or more calories than the full-fat versions. They also often have added sugar, starch, or salt to make up for flavor that was removed with the fat. Make a grocery list of lower-calorie foods and stick to it. Cooking Try to cook your favorite foods in a healthier way. For example, try baking instead of frying. Use low-fat dairy products. Meal planning Use more fruits and vegetables. One-half of your plate should be fruits and vegetables. Include lean proteins, such as chicken, [place], and fish. Lifestyle Each week, aim to do one of the following: 150 minutes of moderate exercise, such as walking. 75 minutes of vigorous exercise, such as  running. General information Know how many calories are in the foods you eat most often. This will help you calculate calorie counts faster. Find a way of tracking calories that works for you. Get creative. Try different apps or programs if writing down calories does not work for you. What foods should I eat?  Eat nutritious foods. It is better to have a nutritious, high-calorie food, such as an avocado, than a food with few nutrients, such as a bag of potato chips. Use your calories on foods and drinks that will fill you up and will not leave you hungry soon after eating. Examples of foods that fill you up are nuts and nut butters, vegetables, lean proteins, and high-fiber foods such as whole grains. High-fiber foods are foods with more than 5 g of fiber per serving. Pay attention to calories in drinks. Low-calorie drinks include water and unsweetened drinks. The items listed above may not be a complete list of foods and beverages you can eat. Contact a dietitian for more information. What foods should I limit? Limit foods or drinks that are not good sources of vitamins, minerals, or protein or that are high in unhealthy fats. These include: Candy. Other sweets. Sodas, specialty coffee drinks, alcohol, and juice. The items listed above may  not be a complete list of foods and beverages you should avoid. Contact a dietitian for more information. How do I count calories when eating out? Pay attention to portions. Often, portions are much larger when eating out. Try these tips to keep portions smaller: Consider sharing a meal instead of getting your own. If you get your own meal, eat only half of it. Before you start eating, ask for a container and put half of your meal into it. When available, consider ordering smaller portions from the menu instead of full portions. Pay attention to your food and drink choices. Knowing the way food is cooked and what is included with the meal can help you eat  fewer calories. If calories are listed on the menu, choose the lower-calorie options. Choose dishes that include vegetables, fruits, whole grains, low-fat dairy products, and lean proteins. Choose items that are boiled, broiled, grilled, or steamed. Avoid items that are buttered, battered, fried, or served with cream sauce. Items labeled as crispy are usually fried, unless stated otherwise. Choose water, low-fat milk, unsweetened iced tea, or other drinks without added sugar. If you want an alcoholic beverage, choose a lower-calorie option, such as a glass of wine or light beer. Ask for dressings, sauces, and syrups on the side. These are usually high in calories, so you should limit the amount you eat. If you want a salad, choose a garden salad and ask for grilled meats. Avoid extra toppings such as bacon, cheese, or fried items. Ask for the dressing on the side, or ask for olive oil and vinegar or lemon to use as dressing. Estimate how many servings of a food you are given. Knowing serving sizes will help you be aware of how much food you are eating at restaurants. Where to find more information Centers for Disease Control and Prevention: http://www.wolf.info/ U.[place]. Department of Agriculture: http://www.wilson-mendoza.org/ Summary Calorie counting means keeping track of how many calories you eat and drink each day. If you eat fewer calories than your body needs, you should lose weight. A healthy amount of weight to lose per week is usually 1-2 lb (0.5-0.9 kg). This usually means reducing your daily calorie intake by 500-750 calories. The number of calories in a food can be found on a Nutrition Facts label. If a food does not have a Nutrition Facts label, try to look up the calories online or ask your dietitian for help. Use smaller plates, glasses, and bowls for smaller portions and to prevent overeating. Use your calories on foods and drinks that will fill you up and not leave you hungry shortly after a meal. This information  is not intended to replace advice given to you by your health care provider. Make sure you discuss any questions you have with your health care provider. Document Revised: 05/20/2019 Document Reviewed: 05/20/2019 Elsevier Patient Education  [place], MD [facility]

## 2022-06-13 LAB — COMPREHENSIVE METABOLIC PANEL
ALT: 31 U/L (ref 0–53)
AST: 33 U/L (ref 0–37)
Albumin: 4.4 g/dL (ref 3.5–5.2)
Alkaline Phosphatase: 67 U/L (ref 39–117)
BUN: 13 mg/dL (ref 6–23)
CO2: 30 mEq/L (ref 19–32)
Calcium: 10.1 mg/dL (ref 8.4–10.5)
Chloride: 100 mEq/L (ref 96–112)
Creatinine, Ser: 0.92 mg/dL (ref 0.40–1.50)
GFR: 114.88 mL/min (ref >60.00)
Glucose, Bld: 115 mg/dL — ABNORMAL HIGH (ref 70–99)
Potassium: 4.1 mEq/L (ref 3.5–5.1)
Sodium: 139 mEq/L (ref 135–145)
Total Bilirubin: 0.4 mg/dL (ref 0.2–1.2)
Total Protein: 8.3 g/dL (ref 6.0–8.3)

## 2022-06-13 LAB — LIPID PANEL
Cholesterol: 151 mg/dL (ref 0–200)
HDL: 35.7 mg/dL — ABNORMAL LOW (ref >39.00)
NonHDL: 115.79
Total CHOL/HDL Ratio: 4
Triglycerides: 214 mg/dL — ABNORMAL HIGH (ref 0.0–149.0)
VLDL: 42.8 mg/dL — ABNORMAL HIGH (ref 0.0–40.0)

## 2022-06-13 LAB — LDL CHOLESTEROL, DIRECT: Direct LDL: 96 mg/dL

## 2022-06-17 DIAGNOSIS — L209 Atopic dermatitis, unspecified: Secondary | ICD-10-CM | POA: Diagnosis not present

## 2022-06-24 ENCOUNTER — Other Ambulatory Visit: Payer: Self-pay | Admitting: Emergency Medicine

## 2022-07-19 DIAGNOSIS — F3132 Bipolar disorder, current episode depressed, moderate: Secondary | ICD-10-CM | POA: Insufficient documentation

## 2022-07-24 ENCOUNTER — Encounter: Payer: Self-pay | Admitting: Emergency Medicine

## 2022-07-24 ENCOUNTER — Ambulatory Visit (INDEPENDENT_AMBULATORY_CARE_PROVIDER_SITE_OTHER): Payer: 59 | Admitting: Emergency Medicine

## 2022-07-24 DIAGNOSIS — R7303 Prediabetes: Secondary | ICD-10-CM | POA: Diagnosis not present

## 2022-07-24 DIAGNOSIS — F3132 Bipolar disorder, current episode depressed, moderate: Secondary | ICD-10-CM | POA: Diagnosis not present

## 2022-07-24 DIAGNOSIS — Z6841 Body Mass Index (BMI) 40.0 and over, adult: Secondary | ICD-10-CM | POA: Diagnosis not present

## 2022-07-24 LAB — LDL CHOLESTEROL, DIRECT: Direct LDL: 85 mg/dL

## 2022-07-24 LAB — LIPID PANEL
Cholesterol: 144 mg/dL (ref 0–200)
HDL: 36.1 mg/dL — ABNORMAL LOW (ref >39.00)
NonHDL: 107.51
Total CHOL/HDL Ratio: 4
Triglycerides: 238 mg/dL — ABNORMAL HIGH (ref 0.0–149.0)
VLDL: 47.6 mg/dL — ABNORMAL HIGH (ref 0.0–40.0)

## 2022-07-24 LAB — COMPREHENSIVE METABOLIC PANEL
ALT: 23 U/L (ref 0–53)
AST: 29 U/L (ref 0–37)
Albumin: 4.2 g/dL (ref 3.5–5.2)
Alkaline Phosphatase: 67 U/L (ref 39–117)
BUN: 14 mg/dL (ref 6–23)
CO2: 31 mEq/L (ref 19–32)
Calcium: 9.5 mg/dL (ref 8.4–10.5)
Chloride: 101 mEq/L (ref 96–112)
Creatinine, Ser: 1.47 mg/dL (ref 0.40–1.50)
GFR: 65.41 mL/min (ref >60.00)
Glucose, Bld: 91 mg/dL (ref 70–99)
Potassium: 3.8 mEq/L (ref 3.5–5.1)
Sodium: 139 mEq/L (ref 135–145)
Total Bilirubin: 0.4 mg/dL (ref 0.2–1.2)
Total Protein: 7.9 g/dL (ref 6.0–8.3)

## 2022-07-24 LAB — CBC WITH DIFFERENTIAL/PLATELET
Basophils Absolute: 0 10*3/uL (ref 0.0–0.1)
Basophils Relative: 0.6 % (ref 0.0–3.0)
Eosinophils Absolute: 0.1 10*3/uL (ref 0.0–0.7)
Eosinophils Relative: 1.3 % (ref 0.0–5.0)
HCT: 41.9 % (ref 39.0–52.0)
Hemoglobin: 14 g/dL (ref 13.0–17.0)
Lymphocytes Relative: 35.2 % (ref 12.0–46.0)
Lymphs Abs: 1.6 10*3/uL (ref 0.7–4.0)
MCHC: 33.5 g/dL (ref 30.0–36.0)
MCV: 92.2 fl (ref 78.0–100.0)
Monocytes Absolute: 0.5 10*3/uL (ref 0.1–1.0)
Monocytes Relative: 10.7 % (ref 3.0–12.0)
Neutro Abs: 2.4 10*3/uL (ref 1.4–7.7)
Neutrophils Relative %: 52.2 % (ref 43.0–77.0)
Platelets: 229 10*3/uL (ref 150.0–400.0)
RBC: 4.54 Mil/uL (ref 4.22–5.81)
RDW: 14.3 % (ref 11.5–15.5)
WBC: 4.6 10*3/uL (ref 4.0–10.5)

## 2022-07-24 LAB — VITAMIN D 25 HYDROXY (VIT D DEFICIENCY, FRACTURES): VITD: 43.51 ng/mL (ref 30.00–100.00)

## 2022-07-24 LAB — CK: Total CK: 1075 U/L — ABNORMAL HIGH (ref 7–232)

## 2022-07-24 LAB — TSH: TSH: 1.89 u[IU]/mL (ref 0.35–5.50)

## 2022-07-24 LAB — VITAMIN B12: Vitamin B-12: 663 pg/mL (ref 211–911)

## 2022-07-24 LAB — HEMOGLOBIN A1C: Hgb A1c MFr Bld: 6.1 % (ref 4.6–6.5)

## 2022-07-24 NOTE — Progress Notes (Signed)
Noah Ramsey 27 y.o.   Chief Complaint  Patient presents with   Medical Management of Chronic Issues    F/u appt, patient is needing labs drawn in order to see if he can be started a new psych med.     HISTORY OF PRESENT ILLNESS: This is a 27 y.o. male requesting blood work today History of bipolar disorder.  Follows up with [facility].  They are requesting blood work before starting new medication.  HPI   Prior to Admission medications   Medication Sig Start Date End Date Taking? Authorizing Provider  buPROPion (WELLBUTRIN XL) 150 MG 24 hr tablet  05/03/21  Yes [provider]  divalproex (DEPAKOTE ER) 500 MG 24 hr tablet  02/16/21  Yes [provider]  INVEGA TRINZA 546 MG/1.75ML injection  01/21/22  Yes [provider]  metFORMIN (GLUCOPHAGE) 500 MG tablet Take 1 tablet (500 mg total) by mouth 2 (two) times daily with a meal. 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  Social History   Socioeconomic History   Marital status: Single    Spouse name: Not on file   Number of children: Not on file   Years of education: Not on file    Highest education level: Associate degree: occupational, Hotel manager, or vocational program  Occupational History   Not on file  Tobacco Use   Smoking status: Never   Smokeless tobacco: Never  Substance and Sexual Activity   Alcohol use: Never   Drug use: Never   Sexual activity: Not on file  Other Topics Concern   Not on file  Social History Narrative   Not on file   Social Determinants of Health   Financial Resource Strain: Low Risk  (07/23/2022)   Overall Financial Resource Strain (CARDIA)    Difficulty of Paying Living Expenses: Not hard at all  Food Insecurity: Food Insecurity Present (07/23/2022)   Hunger Vital Sign    Worried About [place] in the Last Year: Sometimes true    Ran Out of Food in the Last Year: Sometimes true  Transportation Needs: No Transportation Needs (07/23/2022)   PRAPARE - Hydrologist (Medical): No    Lack of Transportation (Non-Medical): No  Physical Activity: Sufficiently Active (07/23/2022)   Exercise Vital Sign    Days of Exercise per Week: 4 days    Minutes of Exercise per Session: 90 min  Stress: Stress Concern Present (07/23/2022)   [place]    Feeling of Stress : Rather much  Social Connections: Moderately Integrated (07/23/2022)   Social Connection and Isolation  Panel [NHANES]    Frequency of Communication with Friends and Family: More than three times a week    Frequency of Social Gatherings with Friends and Family: More than three times a week    Attends Religious Services: More than 4 times per year    Active Member of [employer] or Organizations: Yes    Attends Archivist Meetings: More than 4 times per year    Marital Status: Never married  Intimate Partner Violence: Not At Risk (02/15/2022)   Humiliation, Afraid, Rape, and Kick questionnaire    Fear of Current or Ex-Partner: No    Emotionally Abused: No    Physically Abused: No     Sexually Abused: No    Family History  Problem Relation Age of Onset   Mental illness Brother      Review of Systems  Constitutional: Negative.  Negative for chills and fever.  HENT: Negative.  Negative for congestion and sore throat.   Respiratory: Negative.  Negative for cough and shortness of breath.   Cardiovascular: Negative.  Negative for chest pain and palpitations.  Gastrointestinal:  Negative for abdominal pain, diarrhea, nausea and vomiting.  Genitourinary: Negative.  Negative for dysuria and hematuria.  Skin: Negative.  Negative for rash.  Neurological: Negative.  Negative for dizziness and headaches.  All other systems reviewed and are negative.   Vitals:   07/24/22 1407  BP: 136/84  Pulse: (!) 104  Temp: 99.5 F (37.5 C)  SpO2: 95%    Physical Exam Vitals reviewed.  Constitutional:      Appearance: Normal appearance. He is obese.  HENT:     Head: Normocephalic.  Eyes:     Extraocular Movements: Extraocular movements intact.     Pupils: Pupils are equal, round, and reactive to light.  Cardiovascular:     Rate and Rhythm: Normal rate and regular rhythm.     Pulses: Normal pulses.     Heart sounds: Normal heart sounds.  Pulmonary:     Effort: Pulmonary effort is normal.     Breath sounds: Normal breath sounds.  Musculoskeletal:     Cervical back: No tenderness.  Lymphadenopathy:     Cervical: No cervical adenopathy.  Skin:    General: Skin is warm and dry.     Capillary Refill: Capillary refill takes less than 2 seconds.  Neurological:     General: No focal deficit present.     Mental Status: He is alert and oriented to person, place, and time.  Psychiatric:        Mood and Affect: Mood normal.        Behavior: Behavior normal.      ASSESSMENT & PLAN: A total of 33 minutes was spent with the patient and counseling/coordination of care regarding preparing for this visit, review of most recent office visit note, review of most recent blood  work results, review of chronic medical conditions under management, review of all medications, education on nutrition, prognosis, documentation, and need for follow-up.  Problem List Items Addressed This Visit       Other   Body mass index (BMI) of 45.0-49.9 in adult   Relevant Orders   CK   CBC with Differential/Platelet   Comprehensive metabolic panel   TSH   Hemoglobin A1c   Lipid panel   Valproic Acid level   Vitamin B12   VITAMIN D 25 Hydroxy (Vit-D Deficiency, Fractures)   Prediabetes    Cardiovascular risks associated with diabetes discussed Diet and nutrition discussed  Lab Results  Component Value Date   HGBA1C 6.2 06/12/2022        Relevant Orders   CK   CBC with Differential/Platelet   Comprehensive metabolic panel   TSH   Hemoglobin A1c   Lipid panel   Valproic Acid level   Vitamin B12   VITAMIN D 25 Hydroxy (Vit-D Deficiency, Fractures)   Morbid obesity - Primary    Diet and nutrition discussed Advised to decrease amount of daily carbohydrate intake and daily calories and increase amount of plant-based protein in his diet Benefits of exercise discussed      Bipolar 1 disorder, depressed, moderate    Stable.  Sees psychiatrist on a regular basis. Medications handled by their office      Other Visit Diagnoses     Bipolar affective disorder, depressed, moderate degree       Relevant Medications   buPROPion (WELLBUTRIN XL) 150 MG 24 hr tablet   Other Relevant Orders   CK   CBC with Differential/Platelet   Comprehensive metabolic panel   TSH   Hemoglobin A1c   Lipid panel   Valproic Acid level   Vitamin B12   VITAMIN D 25 Hydroxy (Vit-D Deficiency, Fractures)      Patient Instructions  Health Maintenance, Male Adopting a healthy lifestyle and getting preventive care are important in promoting health and wellness. Ask your health care provider about: The right schedule for you to have regular tests and exams. Things you can do on your own  to prevent diseases and keep yourself healthy. What should I know about diet, weight, and exercise? Eat a healthy diet  Eat a diet that includes plenty of vegetables, fruits, low-fat dairy products, and lean protein. Do not eat a lot of foods that are high in solid fats, added sugars, or sodium. Maintain a healthy weight Body mass index (BMI) is a measurement that can be used to identify possible weight problems. It estimates body fat based on height and weight. Your health care provider can help determine your BMI and help you achieve or maintain a healthy weight. Get regular exercise Get regular exercise. This is one of the most important things you can do for your health. Most adults should: Exercise for at least 150 minutes each week. The exercise should increase your heart rate and make you sweat (moderate-intensity exercise). Do strengthening exercises at least twice a week. This is in addition to the moderate-intensity exercise. Spend less time sitting. Even light physical activity can be beneficial. Watch cholesterol and blood lipids Have your blood tested for lipids and cholesterol at 27 years of age, then have this test every 5 years. You may need to have your cholesterol levels checked more often if: Your lipid or cholesterol levels are high. You are older than 27 years of age. You are at high risk for heart disease. What should I know about cancer screening? Many types of cancers can be detected early and may often be prevented. Depending on your health history and family history, you may need to have cancer screening at various ages. This may include screening for: Colorectal cancer. Prostate cancer. Skin cancer. Lung cancer. What should I know about heart disease, diabetes, and high blood pressure? Blood pressure and heart disease High blood pressure causes heart disease and increases the risk of stroke. This is more likely to develop in people who have high blood pressure  readings or are overweight. Talk with your health care provider about your target blood pressure readings.  Have your blood pressure checked: Every 3-5 years if you are 4-67 years of age. Every year if you are 32 years old or older. If you are between the ages of 75 and 31 and are a current or former smoker, ask your health care provider if you should have a one-time screening for abdominal aortic aneurysm (AAA). Diabetes Have regular diabetes screenings. This checks your fasting blood sugar level. Have the screening done: Once every three years after age 78 if you are at a normal weight and have a low risk for diabetes. More often and at a younger age if you are overweight or have a high risk for diabetes. What should I know about preventing infection? Hepatitis B If you have a higher risk for hepatitis B, you should be screened for this virus. Talk with your health care provider to find out if you are at risk for hepatitis B infection. Hepatitis C Blood testing is recommended for: Everyone born from [birth year] through 1965. Anyone with known risk factors for hepatitis C. Sexually transmitted infections (STIs) You should be screened each year for STIs, including gonorrhea and chlamydia, if: You are sexually active and are younger than 27 years of age. You are older than 27 years of age and your health care provider tells you that you are at risk for this type of infection. Your sexual activity has changed since you were last screened, and you are at increased risk for chlamydia or gonorrhea. Ask your health care provider if you are at risk. Ask your health care provider about whether you are at high risk for HIV. Your health care provider may recommend a prescription medicine to help prevent HIV infection. If you choose to take medicine to prevent HIV, you should first get tested for HIV. You should then be tested every 3 months for as long as you are taking the medicine. Follow these instructions  at home: Alcohol use Do not drink alcohol if your health care provider tells you not to drink. If you drink alcohol: Limit how much you have to 0-2 drinks a day. Know how much alcohol is in your drink. In the [place] one drink equals one 12 oz bottle of beer (355 mL), one 5 oz glass of wine (148 mL), or one 1 oz glass of hard liquor (44 mL). Lifestyle Do not use any products that contain nicotine or tobacco. These products include cigarettes, chewing tobacco, and vaping devices, such as e-cigarettes. If you need help quitting, ask your health care provider. Do not use street drugs. Do not share needles. Ask your health care provider for help if you need support or information about quitting drugs. General instructions Schedule regular health, dental, and eye exams. Stay current with your vaccines. Tell your health care provider if: You often feel depressed. You have ever been abused or do not feel safe at home. Summary Adopting a healthy lifestyle and getting preventive care are important in promoting health and wellness. Follow your health care provider's instructions about healthy diet, exercising, and getting tested or screened for diseases. Follow your health care provider's instructions on monitoring your cholesterol and blood pressure. This information is not intended to replace advice given to you by your health care provider. Make sure you discuss any questions you have with your health care provider. Document Revised: 08/28/2020 Document Reviewed: 08/28/2020 Elsevier Patient Education  [place], MD [facility]

## 2022-07-24 NOTE — Assessment & Plan Note (Signed)
Stable.  Sees psychiatrist on a regular basis. Medications handled by their office

## 2022-07-24 NOTE — Patient Instructions (Signed)
Health Maintenance, Male Adopting a healthy lifestyle and getting preventive care are important in promoting health and wellness. Ask your health care provider about: The right schedule for you to have regular tests and exams. Things you can do on your own to prevent diseases and keep yourself healthy. What should I know about diet, weight, and exercise? Eat a healthy diet  Eat a diet that includes plenty of vegetables, fruits, low-fat dairy products, and lean protein. Do not eat a lot of foods that are high in solid fats, added sugars, or sodium. Maintain a healthy weight Body mass index (BMI) is a measurement that can be used to identify possible weight problems. It estimates body fat based on height and weight. Your health care provider can help determine your BMI and help you achieve or maintain a healthy weight. Get regular exercise Get regular exercise. This is one of the most important things you can do for your health. Most adults should: Exercise for at least 150 minutes each week. The exercise should increase your heart rate and make you sweat (moderate-intensity exercise). Do strengthening exercises at least twice a week. This is in addition to the moderate-intensity exercise. Spend less time sitting. Even light physical activity can be beneficial. Watch cholesterol and blood lipids Have your blood tested for lipids and cholesterol at 27 years of age, then have this test every 5 years. You may need to have your cholesterol levels checked more often if: Your lipid or cholesterol levels are high. You are older than 27 years of age. You are at high risk for heart disease. What should I know about cancer screening? Many types of cancers can be detected early and may often be prevented. Depending on your health history and family history, you may need to have cancer screening at various ages. This may include screening for: Colorectal cancer. Prostate cancer. Skin cancer. Lung  cancer. What should I know about heart disease, diabetes, and high blood pressure? Blood pressure and heart disease High blood pressure causes heart disease and increases the risk of stroke. This is more likely to develop in people who have high blood pressure readings or are overweight. Talk with your health care provider about your target blood pressure readings. Have your blood pressure checked: Every 3-5 years if you are 18-39 years of age. Every year if you are 40 years old or older. If you are between the ages of 65 and 75 and are a current or former smoker, ask your health care provider if you should have a one-time screening for abdominal aortic aneurysm (AAA). Diabetes Have regular diabetes screenings. This checks your fasting blood sugar level. Have the screening done: Once every three years after age 45 if you are at a normal weight and have a low risk for diabetes. More often and at a younger age if you are overweight or have a high risk for diabetes. What should I know about preventing infection? Hepatitis B If you have a higher risk for hepatitis B, you should be screened for this virus. Talk with your health care provider to find out if you are at risk for hepatitis B infection. Hepatitis C Blood testing is recommended for: Everyone born from 1945 through 1965. Anyone with known risk factors for hepatitis C. Sexually transmitted infections (STIs) You should be screened each year for STIs, including gonorrhea and chlamydia, if: You are sexually active and are younger than 27 years of age. You are older than 27 years of age and your   health care provider tells you that you are at risk for this type of infection. Your sexual activity has changed since you were last screened, and you are at increased risk for chlamydia or gonorrhea. Ask your health care provider if you are at risk. Ask your health care provider about whether you are at high risk for HIV. Your health care provider  may recommend a prescription medicine to help prevent HIV infection. If you choose to take medicine to prevent HIV, you should first get tested for HIV. You should then be tested every 3 months for as long as you are taking the medicine. Follow these instructions at home: Alcohol use Do not drink alcohol if your health care provider tells you not to drink. If you drink alcohol: Limit how much you have to 0-2 drinks a day. Know how much alcohol is in your drink. In the [place] one drink equals one 12 oz bottle of beer (355 mL), one 5 oz glass of wine (148 mL), or one 1 oz glass of hard liquor (44 mL). Lifestyle Do not use any products that contain nicotine or tobacco. These products include cigarettes, chewing tobacco, and vaping devices, such as e-cigarettes. If you need help quitting, ask your health care provider. Do not use street drugs. Do not share needles. Ask your health care provider for help if you need support or information about quitting drugs. General instructions Schedule regular health, dental, and eye exams. Stay current with your vaccines. Tell your health care provider if: You often feel depressed. You have ever been abused or do not feel safe at home. Summary Adopting a healthy lifestyle and getting preventive care are important in promoting health and wellness. Follow your health care provider's instructions about healthy diet, exercising, and getting tested or screened for diseases. Follow your health care provider's instructions on monitoring your cholesterol and blood pressure. This information is not intended to replace advice given to you by your health care provider. Make sure you discuss any questions you have with your health care provider. Document Revised: 08/28/2020 Document Reviewed: 08/28/2020 Elsevier Patient Education  2023 Elsevier Inc.  

## 2022-07-24 NOTE — Assessment & Plan Note (Signed)
Cardiovascular risks associated with diabetes discussed Diet and nutrition discussed Lab Results  Component Value Date   HGBA1C 6.2 06/12/2022

## 2022-07-24 NOTE — Assessment & Plan Note (Signed)
Diet and nutrition discussed.  Advised to decrease amount of daily carbohydrate intake and daily calories and increase amount of plant-based protein in his diet. Benefits of exercise discussed. 

## 2022-07-25 LAB — VALPROIC ACID LEVEL: Valproic Acid Lvl: <12.5 mg/L — ABNORMAL LOW (ref 50.0–100.0)

## 2022-09-09 DIAGNOSIS — L209 Atopic dermatitis, unspecified: Secondary | ICD-10-CM | POA: Diagnosis not present

## 2022-09-09 DIAGNOSIS — L810 Postinflammatory hyperpigmentation: Secondary | ICD-10-CM | POA: Diagnosis not present

## 2022-10-17 ENCOUNTER — Telehealth: Payer: Self-pay | Admitting: Emergency Medicine

## 2022-10-17 NOTE — Telephone Encounter (Signed)
Attempted to call patient to inform him that labs were faxed the his behavioral health provider, unable to reach patient or leave a message

## 2022-10-17 NOTE — Telephone Encounter (Signed)
Patient called and said he behavioral health provider requested his lab results from his 07/24/2022 appointment. They would like them faxed to [phone]. Best callback for patient is [phone].

## 2022-10-28 ENCOUNTER — Ambulatory Visit (HOSPITAL_COMMUNITY): Payer: 59 | Admitting: Clinical

## 2022-11-04 ENCOUNTER — Other Ambulatory Visit: Payer: Self-pay

## 2022-11-04 ENCOUNTER — Encounter (HOSPITAL_COMMUNITY): Payer: Self-pay

## 2022-11-04 ENCOUNTER — Emergency Department (HOSPITAL_COMMUNITY)
Admission: EM | Admit: 2022-11-04 | Discharge: 2022-11-05 | Discharge status: Against Medical Advice | Payer: 59 | Attending: Emergency Medicine | Admitting: Emergency Medicine

## 2022-11-04 DIAGNOSIS — R569 Unspecified convulsions: Secondary | ICD-10-CM | POA: Diagnosis not present

## 2022-11-04 DIAGNOSIS — T887XXA Unspecified adverse effect of drug or medicament, initial encounter: Secondary | ICD-10-CM | POA: Insufficient documentation

## 2022-11-04 DIAGNOSIS — Z5321 Procedure and treatment not carried out due to patient leaving prior to being seen by health care provider: Secondary | ICD-10-CM | POA: Insufficient documentation

## 2022-11-04 DIAGNOSIS — R55 Syncope and collapse: Secondary | ICD-10-CM | POA: Diagnosis not present

## 2022-11-04 DIAGNOSIS — G4489 Other headache syndrome: Secondary | ICD-10-CM | POA: Diagnosis not present

## 2022-11-04 DIAGNOSIS — R410 Disorientation, unspecified: Secondary | ICD-10-CM | POA: Diagnosis not present

## 2022-11-04 DIAGNOSIS — R519 Headache, unspecified: Secondary | ICD-10-CM | POA: Diagnosis not present

## 2022-11-04 NOTE — ED Triage Notes (Signed)
Patient BIB [facility] from home patient reported that he possibly had  a seizure after taking wellbutrin at 0900 this am. Patient reports going into a catatonic state. Report never had this happen in the past. Reports headache since 0900.   Patient reports in triage that he took his medication without eating this am. After taking medication he became tired, fell asleep, and woke up with a vibration in his head, patient describes as "brain zap". Patient reports event occurred over 1.5 hours.   PMHX: Schizophrenia

## 2022-11-05 NOTE — ED Notes (Signed)
Pt left without being seen.

## 2022-11-15 ENCOUNTER — Telehealth: Payer: Self-pay | Admitting: *Deleted

## 2022-11-15 NOTE — Telephone Encounter (Signed)
Transition Care Management Unsuccessful Follow-up Telephone Call  Date of discharge and from where:  [facility] 11/05/2022  Attempts:  1st Attempt  Reason for unsuccessful TCM follow-up call: Patient eloped but is feeling better

## 2022-12-03 ENCOUNTER — Other Ambulatory Visit: Payer: Self-pay | Admitting: Emergency Medicine

## 2022-12-03 ENCOUNTER — Telehealth: Payer: Self-pay | Admitting: Emergency Medicine

## 2022-12-03 MED ORDER — METFORMIN HCL 500 MG PO TABS: 500.0000 mg | ORAL_TABLET | Freq: Two times a day (BID) | ORAL | 3 refills | Status: DC

## 2022-12-03 NOTE — Telephone Encounter (Signed)
Prescription Request  12/03/2022  LOV: 07/24/2022  What is the name of the medication or equipment? metforman  Have you contacted your pharmacy to request a refill? Yes   Which pharmacy would you like this sent to?   [facility] - Phone [phone] - [place] - [address]  Patient notified that their request is being sent to the clinical staff for review and that they should receive a response within 2 business days.   Please advise at Mobile [phone] (mobile)

## 2022-12-03 NOTE — Telephone Encounter (Signed)
New prescription for metformin sent to pharmacy of record today.  Thanks.

## 2022-12-17 ENCOUNTER — Ambulatory Visit (HOSPITAL_COMMUNITY)
Admission: EM | Admit: 2022-12-17 | Discharge: 2022-12-17 | Disposition: A | Discharge status: Other Acute Inpatient Hospital | Payer: 59 | Attending: Psychiatry | Admitting: Psychiatry

## 2022-12-17 ENCOUNTER — Encounter (HOSPITAL_COMMUNITY): Payer: Self-pay | Admitting: Psychiatry

## 2022-12-17 ENCOUNTER — Other Ambulatory Visit: Payer: Self-pay

## 2022-12-17 ENCOUNTER — Inpatient Hospital Stay (HOSPITAL_COMMUNITY)
Admission: AD | Admit: 2022-12-17 | Discharge: 2022-12-26 | DRG: 885 | Disposition: A | Discharge status: Home / Self Care | Payer: 59 | Source: Intra-hospital | Attending: Psychiatry | Admitting: Psychiatry

## 2022-12-17 DIAGNOSIS — R9431 Abnormal electrocardiogram [ECG] [EKG]: Secondary | ICD-10-CM | POA: Insufficient documentation

## 2022-12-17 DIAGNOSIS — Z818 Family history of other mental and behavioral disorders: Secondary | ICD-10-CM | POA: Diagnosis not present

## 2022-12-17 DIAGNOSIS — Z7984 Long term (current) use of oral hypoglycemic drugs: Secondary | ICD-10-CM | POA: Diagnosis not present

## 2022-12-17 DIAGNOSIS — Z560 Unemployment, unspecified: Secondary | ICD-10-CM | POA: Insufficient documentation

## 2022-12-17 DIAGNOSIS — F32A Depression, unspecified: Secondary | ICD-10-CM | POA: Diagnosis present

## 2022-12-17 DIAGNOSIS — Z6841 Body Mass Index (BMI) 40.0 and over, adult: Secondary | ICD-10-CM

## 2022-12-17 DIAGNOSIS — F200 Paranoid schizophrenia: Secondary | ICD-10-CM | POA: Diagnosis not present

## 2022-12-17 DIAGNOSIS — R45851 Suicidal ideations: Secondary | ICD-10-CM | POA: Diagnosis present

## 2022-12-17 DIAGNOSIS — Z813 Family history of other psychoactive substance abuse and dependence: Secondary | ICD-10-CM | POA: Diagnosis not present

## 2022-12-17 DIAGNOSIS — Z87891 Personal history of nicotine dependence: Secondary | ICD-10-CM | POA: Diagnosis not present

## 2022-12-17 DIAGNOSIS — Z79899 Other long term (current) drug therapy: Secondary | ICD-10-CM

## 2022-12-17 DIAGNOSIS — F203 Undifferentiated schizophrenia: Secondary | ICD-10-CM | POA: Diagnosis not present

## 2022-12-17 DIAGNOSIS — F29 Unspecified psychosis not due to a substance or known physiological condition: Secondary | ICD-10-CM | POA: Diagnosis present

## 2022-12-17 DIAGNOSIS — G4700 Insomnia, unspecified: Secondary | ICD-10-CM | POA: Insufficient documentation

## 2022-12-17 HISTORY — DX: Other complications of anesthesia, initial encounter: T88.59XA

## 2022-12-17 LAB — CBC WITH DIFFERENTIAL/PLATELET
Abs Immature Granulocytes: 0.01 10*3/uL (ref 0.00–0.07)
Basophils Absolute: 0 10*3/uL (ref 0.0–0.1)
Basophils Relative: 0 %
Eosinophils Absolute: 0.1 10*3/uL (ref 0.0–0.5)
Eosinophils Relative: 2 %
HCT: 42.9 % (ref 39.0–52.0)
Hemoglobin: 14.1 g/dL (ref 13.0–17.0)
Immature Granulocytes: 0 %
Lymphocytes Relative: 36 %
Lymphs Abs: 1.7 10*3/uL (ref 0.7–4.0)
MCH: 29.7 pg (ref 26.0–34.0)
MCHC: 32.9 g/dL (ref 30.0–36.0)
MCV: 90.5 fL (ref 80.0–100.0)
Monocytes Absolute: 0.5 10*3/uL (ref 0.1–1.0)
Monocytes Relative: 10 %
Neutro Abs: 2.5 10*3/uL (ref 1.7–7.7)
Neutrophils Relative %: 52 %
Platelets: 213 10*3/uL (ref 150–400)
RBC: 4.74 MIL/uL (ref 4.22–5.81)
RDW: 13.8 % (ref 11.5–15.5)
WBC: 4.8 10*3/uL (ref 4.0–10.5)
nRBC: 0 % (ref 0.0–0.2)

## 2022-12-17 LAB — COMPREHENSIVE METABOLIC PANEL
ALT: 31 U/L (ref 0–44)
AST: 45 U/L — ABNORMAL HIGH (ref 15–41)
Albumin: 3.7 g/dL (ref 3.5–5.0)
Alkaline Phosphatase: 58 U/L (ref 38–126)
Anion gap: 10 (ref 5–15)
BUN: 11 mg/dL (ref 6–20)
CO2: 26 mmol/L (ref 22–32)
Calcium: 9.2 mg/dL (ref 8.9–10.3)
Chloride: 100 mmol/L (ref 98–111)
Creatinine, Ser: 0.97 mg/dL (ref 0.61–1.24)
GFR, Estimated: >60 mL/min (ref >60)
Glucose, Bld: 114 mg/dL — ABNORMAL HIGH (ref 70–99)
Potassium: 3.6 mmol/L (ref 3.5–5.1)
Sodium: 136 mmol/L (ref 135–145)
Total Bilirubin: 0.9 mg/dL (ref 0.3–1.2)
Total Protein: 7.7 g/dL (ref 6.5–8.1)

## 2022-12-17 LAB — LIPID PANEL
Cholesterol: 154 mg/dL (ref 0–200)
HDL: 40 mg/dL — ABNORMAL LOW (ref >40)
LDL Cholesterol: 100 mg/dL — ABNORMAL HIGH (ref 0–99)
Total CHOL/HDL Ratio: 3.9 RATIO
Triglycerides: 70 mg/dL (ref <150)
VLDL: 14 mg/dL (ref 0–40)

## 2022-12-17 LAB — POCT URINE DRUG SCREEN - MANUAL ENTRY (I-SCREEN)
POC Amphetamine UR: NOT DETECTED
POC Buprenorphine (BUP): NOT DETECTED
POC Cocaine UR: NOT DETECTED
POC Marijuana UR: NOT DETECTED
POC Methadone UR: NOT DETECTED
POC Methamphetamine UR: NOT DETECTED
POC Morphine: NOT DETECTED
POC Oxazepam (BZO): NOT DETECTED
POC Oxycodone UR: NOT DETECTED
POC Secobarbital (BAR): NOT DETECTED

## 2022-12-17 LAB — MAGNESIUM: Magnesium: 2 mg/dL (ref 1.7–2.4)

## 2022-12-17 LAB — VALPROIC ACID LEVEL: Valproic Acid Lvl: 19 ug/mL — ABNORMAL LOW (ref 50.0–100.0)

## 2022-12-17 LAB — TSH: TSH: 4.096 u[IU]/mL (ref 0.350–4.500)

## 2022-12-17 LAB — ETHANOL: Alcohol, Ethyl (B): <10 mg/dL (ref <10)

## 2022-12-17 LAB — HEMOGLOBIN A1C
Hgb A1c MFr Bld: 6.1 % — ABNORMAL HIGH (ref 4.8–5.6)
Mean Plasma Glucose: 128.37 mg/dL

## 2022-12-17 MED ORDER — MAGNESIUM HYDROXIDE 400 MG/5ML PO SUSP: 30.0000 mL | Freq: Every day | ORAL | Status: DC | PRN

## 2022-12-17 MED ORDER — DIVALPROEX SODIUM ER 500 MG PO TB24
500.0000 mg | ORAL_TABLET | Freq: Every day | ORAL | Status: DC
  Administered 2022-12-17: 500 mg via ORAL
  Filled 2022-12-17 (×4): qty 1

## 2022-12-17 MED ORDER — METFORMIN HCL 500 MG PO TABS
500.0000 mg | ORAL_TABLET | Freq: Two times a day (BID) | ORAL | Status: DC
  Administered 2022-12-17 – 2022-12-26 (×17): 500 mg via ORAL
  Filled 2022-12-17 (×3): qty 1
  Filled 2022-12-17: qty 6
  Filled 2022-12-17 (×16): qty 1
  Filled 2022-12-17: qty 6
  Filled 2022-12-17: qty 1

## 2022-12-17 MED ORDER — ALUM & MAG HYDROXIDE-SIMETH 200-200-20 MG/5ML PO SUSP: 30.0000 mL | ORAL | Status: DC | PRN

## 2022-12-17 MED ORDER — DIPHENHYDRAMINE HCL 25 MG PO CAPS
50.0000 mg | ORAL_CAPSULE | Freq: Three times a day (TID) | ORAL | Status: DC | PRN
  Administered 2022-12-17 – 2022-12-18 (×2): 50 mg via ORAL
  Filled 2022-12-17 (×2): qty 2

## 2022-12-17 MED ORDER — METFORMIN HCL 500 MG PO TABS: 500.0000 mg | ORAL_TABLET | Freq: Two times a day (BID) | ORAL | Status: DC

## 2022-12-17 MED ORDER — VENLAFAXINE HCL ER 37.5 MG PO CP24: 37.5000 mg | ORAL_CAPSULE | Freq: Every day | ORAL | Status: DC

## 2022-12-17 MED ORDER — ACETAMINOPHEN 325 MG PO TABS: 650.0000 mg | ORAL_TABLET | Freq: Four times a day (QID) | ORAL | Status: DC | PRN

## 2022-12-17 MED ORDER — LORAZEPAM 1 MG PO TABS
2.0000 mg | ORAL_TABLET | Freq: Three times a day (TID) | ORAL | Status: DC | PRN
  Administered 2022-12-17 – 2022-12-18 (×2): 2 mg via ORAL
  Filled 2022-12-17 (×2): qty 2

## 2022-12-17 MED ORDER — ADULT MULTIVITAMIN W/MINERALS CH
1.0000 | ORAL_TABLET | Freq: Every day | ORAL | Status: DC
  Administered 2022-12-18 – 2022-12-26 (×8): 1 via ORAL
  Filled 2022-12-17 (×5): qty 1
  Filled 2022-12-17: qty 3
  Filled 2022-12-17 (×5): qty 1

## 2022-12-17 MED ORDER — LORAZEPAM 2 MG/ML IJ SOLN: 2.0000 mg | Freq: Three times a day (TID) | INTRAMUSCULAR | Status: DC | PRN

## 2022-12-17 MED ORDER — VENLAFAXINE HCL ER 37.5 MG PO CP24
37.5000 mg | ORAL_CAPSULE | Freq: Every day | ORAL | Status: DC
  Administered 2022-12-18: 37.5 mg via ORAL
  Filled 2022-12-17 (×3): qty 1

## 2022-12-17 MED ORDER — ACETAMINOPHEN 325 MG PO TABS
650.0000 mg | ORAL_TABLET | Freq: Four times a day (QID) | ORAL | Status: DC | PRN
  Administered 2022-12-21 – 2022-12-26 (×5): 650 mg via ORAL
  Filled 2022-12-17 (×6): qty 2

## 2022-12-17 MED ORDER — VITAMIN D3 25 MCG (1000 UNIT) PO TABS
10000.0000 [IU] | ORAL_TABLET | Freq: Every day | ORAL | Status: DC
  Filled 2022-12-17: qty 10

## 2022-12-17 MED ORDER — DIPHENHYDRAMINE HCL 50 MG/ML IJ SOLN: 50.0000 mg | Freq: Three times a day (TID) | INTRAMUSCULAR | Status: DC | PRN

## 2022-12-17 MED ORDER — HALOPERIDOL 5 MG PO TABS
5.0000 mg | ORAL_TABLET | Freq: Three times a day (TID) | ORAL | Status: DC | PRN
  Administered 2022-12-17 – 2022-12-18 (×2): 5 mg via ORAL
  Filled 2022-12-17 (×2): qty 1

## 2022-12-17 MED ORDER — TRAZODONE HCL 50 MG PO TABS: 50.0000 mg | ORAL_TABLET | Freq: Every evening | ORAL | Status: DC | PRN

## 2022-12-17 MED ORDER — ADULT MULTIVITAMIN W/MINERALS CH: 1.0000 | ORAL_TABLET | Freq: Every day | ORAL | Status: DC

## 2022-12-17 MED ORDER — HYDROXYZINE HCL 25 MG PO TABS: 25.0000 mg | ORAL_TABLET | Freq: Three times a day (TID) | ORAL | Status: DC | PRN

## 2022-12-17 MED ORDER — DIVALPROEX SODIUM ER 500 MG PO TB24: 500.0000 mg | ORAL_TABLET | Freq: Every day | ORAL | Status: DC

## 2022-12-17 MED ORDER — VITAMIN D3 25 MCG PO TABS
10000.0000 [IU] | ORAL_TABLET | Freq: Every day | ORAL | Status: DC
  Administered 2022-12-18: 10000 [IU] via ORAL
  Filled 2022-12-17 (×3): qty 10

## 2022-12-17 MED ORDER — TRAZODONE HCL 50 MG PO TABS
50.0000 mg | ORAL_TABLET | Freq: Every evening | ORAL | Status: DC | PRN
  Administered 2022-12-17 – 2022-12-25 (×9): 50 mg via ORAL
  Filled 2022-12-17 (×9): qty 1
  Filled 2022-12-17: qty 3

## 2022-12-17 MED ORDER — HALOPERIDOL LACTATE 5 MG/ML IJ SOLN: 5.0000 mg | Freq: Three times a day (TID) | INTRAMUSCULAR | Status: DC | PRN

## 2022-12-17 MED ORDER — HYDROXYZINE HCL 25 MG PO TABS
25.0000 mg | ORAL_TABLET | Freq: Three times a day (TID) | ORAL | Status: DC | PRN
  Administered 2022-12-17 – 2022-12-25 (×10): 25 mg via ORAL
  Filled 2022-12-17 (×8): qty 1
  Filled 2022-12-17: qty 10
  Filled 2022-12-17 (×2): qty 1

## 2022-12-17 NOTE — [facility] Group Notes (Signed)
Adult Psychoeducational Group Note  Date:  12/17/2022 Time:  8:20 PM  Group Topic/Focus:  Wrap-Up Group:   The focus of this group is to help patients review their daily goal of treatment and discuss progress on daily workbooks.  Participation Level:  Active  Participation Quality:  Appropriate and Redirectable  Affect:  Appropriate  Cognitive:  Appropriate  Insight: Good  Engagement in Group:  Engaged  Modes of Intervention:  Discussion  Additional Comments:   Pt states that he doesn't feel good about being here. Pt states he hasn't been here in 2 years and wants to leave. Pt called mother and brother today, and went outside and to meals with peers. Pt states he's hopeful to comply with treatment  Vevelyn Pat 12/17/2022, 8:20 PM

## 2022-12-17 NOTE — Progress Notes (Signed)
Recreation Therapy Notes  INPATIENT RECREATION THERAPY ASSESSMENT  Patient Details Name: Yoltzin Lahner MRN: [id number] DOB: [birth year] Today's Date: 12/17/2022       Information Obtained From: Patient  Able to Participate in Assessment/Interview: Yes (Pt stated he was mildly cognitive and has some memory loss. Pt has a playful demeanor at times.)  Patient Presentation: Alert  Reason for Admission (Per Patient): Other (Comments) ("disordered thinking")  Patient Stressors: Other (Comment) (PTSD, didn't do breathing exercises, paranoid thinking)  Coping Skills:   Isolation, Self-Injury, Sports, TV, Music, Exercise, Deep Breathing, Substance Abuse, Talk, Art, Prayer, Avoidance, Hot Bath/Shower, Other (Comment) (Art-Coloring)  Leisure Interests (2+):  Individual - Other (Comment) (Social media, research different topics and watch porn)  Frequency of Recreation/Participation: Other (Comment) (Daily; pt stated he spends at least 90 hrs a week on his phone.)  Awareness of Community Resources:  Yes  Community Resources:  Gym  Current Use: Yes (Pt expressed he goes to the gym "when I feel like it".)  If no, Barriers?:    Expressed Interest in [employer] Information: Yes  County of Residence:  [place]  Patient Main Form of Transportation: Uber/Lyft (Non emergency transport; his mother's car, brother)  Patient Strengths:  Sense of humor, mannerisms, mechanically inclined, intelligent, observant  Patient Identified Areas of Improvement:  Selfish, self absorbed, lack of empathy for others, no filter, pathological liar  Patient Goal for Hospitalization:  "to cooperate with doctors to get a sedative to help me sleep, don't take things that will increase my energy and get peace of mind"  Current SI (including self-harm):  No  Current HI:  No  Current AVH: No  Staff Intervention Plan: Group Attendance, Collaborate with Interdisciplinary Treatment  Team  Consent to Intern Participation: N/A   Naoko Diperna-McCall, LRT,CTRS [place] Chiem A Hannan Tetzlaff-McCall 12/17/2022, 2:54 PM

## 2022-12-17 NOTE — [facility] Counselor (Signed)
Adult Comprehensive Assessment  Patient ID: Noah Ramsey, male   DOB: 03-Oct-1995, 27 y.o.   MRN: [id number]  Information Source: Information source: Patient  Current Stressors:  Patient states their primary concerns and needs for treatment are:: 27 y/o male pt presents to [facility] voluntarily indicating that he had attempted to die by suicide by deprivation and starvation. "I tried to starve myself a few hours before checking myself in" Pt continues to endorse SI and indicates that his mother forced him to be seen at [facility], after expressing the desire to die by suicide. Pt acknowledges significant psychiatric Hx and states that he receives care at [facility] outpatient. Patient states their goals for this hospitilization and ongoing recovery are:: "Better sleep" Educational / Learning stressors: pt denied Employment / Job issues: Insurance claims handler Family Relationships: none reported Surveyor, quantity / Lack of resources (include bankruptcy): none reported Housing / Lack of housing: none reported Physical health (include injuries & life threatening diseases): History of rhabdomyolysis Social relationships: none reported Substance abuse: pt denied Bereavement / Loss: none reported  Living/Environment/Situation:  Living Arrangements: Parent, Other relatives Who else lives in the home?: MY Mother, Stepfather and brother How long has patient lived in current situation?: several years What is atmosphere in current home: Supportive  Family History:  Marital status: Single Are you sexually active?: No What is your sexual orientation?: Heterosexual Has your sexual activity been affected by drugs, alcohol, medication, or emotional stress?: Denies Does patient have children?: No  Childhood History:  By whom was/is the patient raised?: Both parents Additional childhood history information: Good, parents divorced Description of patient's relationship with caregiver when they were a child: Good How were you  disciplined when you got in trouble as a child/adolescent?: Grounded/Spankings Does patient have siblings?: Yes Number of Siblings: 3 Description of patient's current relationship with siblings: States he has 3 brothers with which he has good relationships Did patient suffer any verbal/emotional/physical/sexual abuse as a child?: No Did patient suffer from severe childhood neglect?: No Has patient ever been sexually abused/assaulted/raped as an adolescent or adult?: No Was the patient ever a victim of a crime or a disaster?: No Witnessed domestic violence?: No Has patient been affected by domestic violence as an adult?: No  Education:  Highest grade of school patient has completed: Nurse, mental health Currently a student?: No Learning disability?: No  Employment/Work Situation:   Employment Situation: On disability Why is Patient on Disability: Schizophrenia. How Long has Patient Been on Disability: Per pt, since 2022. 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?: No  Financial Resources:   Surveyor, quantity resources: Insurance claims handler, [employer], Media planner, Food stamps  Alcohol/Substance Abuse:   What has been your use of drugs/alcohol within the last 12 months?: none reported If attempted suicide, did drugs/alcohol play a role in this?: No Alcohol/Substance Abuse Treatment Hx: Denies past history Has alcohol/substance abuse ever caused legal problems?: No  Social Support System:   Patient's Community Support System: Good Describe Community Support System: Transport planner (Medication management) Type of faith/religion: Agnostic How does patient's faith help to cope with current illness?: Meditation  Leisure/Recreation:   Do You Have Hobbies?: Yes Leisure and Hobbies: Patient enjoys watching Arts administrator.  Strengths/Needs:   What is the patient's perception of their strengths?: I am good with complex stuff Patient  states they can use these personal strengths during their treatment to contribute to their recovery: DNA Patient states these barriers may affect/interfere with their treatment: none reported Patient states these barriers may affect their return to the community: none reported  Discharge Plan:   Currently receiving community mental health services: Yes (From Whom) Vesta Mixer) Patient states concerns and preferences for aftercare planning are: Pt would like to be seen in person for therapy Patient states they will know when they are safe and ready for discharge when: Once I am no longer trying to hurt myself Does patient have access to transportation?: Yes Does patient have financial barriers related to discharge medications?: No Patient description of barriers related to discharge medications: none reported Will patient be returning to same living situation after discharge?: Yes  Summary/Recommendations:   Summary and Recommendations (to be completed by the evaluator): 27 y/o male pt presents to [facility] accompanied by his mother voluntarily after disclosing that he attempted to die by suicide by deprivation and starvation. Pt reports that he feels "delirious" and asserts that he is hopeless concerning his outlook concerning his future. Pt acknowledges SI/ with a plan and also endorses audio hallucinations cureently. While here, Noah Ramsey can benefit from crisis stabilization, medication management, therapeutic milieu, and referrals for services.  Noah Ramsey. 12/17/2022

## 2022-12-17 NOTE — ED Notes (Signed)
Pt A&O x 4, presents with suicidal ideations, with no plan or intent. Denies HI or AVH.  Pt calm & cooperative, monitoring for safety.

## 2022-12-17 NOTE — Progress Notes (Signed)
Admission note: Patient is a 27 year old AA Male, voluntarily admitted from [facility], for suicidal ideation, and bizarre behavior . Patient arrived to the  unit via safe transport, alert, awake and oriented X's 4. According to patent, He lives with mom, older brother, and step dad. Patient states "I feel like its paranoia, I'm not in my own reality, I don't feel like a demon, but I feel like I'm disconnected with my reality, my biggest issue was disconnection from my reality." Patient denies SI/HI/AVH. No acute distress noted at this time.  Pt has orientation to unit, room and routine, and Information packet given to him.  Admission INP armband ID verified with patient, and in place. Fall risk assessment completed with Patient and he verbalized understanding of risks associated with falls. No contraband found during skin assessment, Skin, clean-dry- intact without evidence of bruising, or skin tears and tracks marks. Q 15 minutes safety observation in place. Staff will continue to provide support to patient.

## 2022-12-17 NOTE — ED Provider Notes (Cosign Needed Addendum)
[facility] Continuous Assessment Admission H&P  Date: 12/17/22 Patient Name: Noah Ramsey MRN: [id number] Chief Complaint: suicidal ideations   Diagnoses:  Final diagnoses:  Paranoid schizophrenia (HCC)    HPI:  patient presented to [facility] as a walk in after being droped off by his brother.  He is unaccompanied and presents with complaints of suicidal ideations.  [name], 27 y.o., male patient seen face to face by this provider, consulted with Dr. Lucianne Muss; and chart reviewed on 12/17/22.  Per chart review patient has a past psychiatric history of paranoid schizophrenia, suicidal ideations, psychosis, auditory hallucinations.  He has a past medical history that includes prediabetes, morbid obesity, and rhabdomyolysis.  Patient reports he has services in place with [facility].  He reports taking Effexor 37.5 mg daily, Depakote ER 500 mg nightly, Ozempic 2.5 mg weekly injection, metformin 1000 mg twice daily, and Invega (unsure of dosage).  He is unemployed and receives SSI check.  He lives at home with his mother, stepfather, and brother.  He denies all substance use.  On evaluation Mckennon Mange reports he tried to commit suicide roughly 6 hours ago.  States he tries to starve himself, over hydrate by drinking too much water, or exercise himself to death.  States his mother encouraged him to present for assessment.  He feels like he is in purgatory and he is a "tortured self".  States he keeps trying to kill himself but he keeps waking up because he is unsuccessful.  Reports he feels like he is on being.  He believes he is a "winego".  He describes this is manageable.  And he is the most serious of all the evil spirits.  He believes he is being tortured by this.  He is unable to contract for safety.  States if he were to leave today he knows that he will try to kill himself again.  During evaluation Tegen Landau is observed sitting in the assessment room in no acute  distress.  He is alert/oriented x 4, calm, cooperative, and fairly attentive.  His speech is clear and coherent at a normal rate and tone.  He is overweight and dressed casually.  He makes fair eye contact.  He endorses depression with feelings of helplessness, hopelessness, decreased energy, decreased appetite.  Reports he has not slept in several days.  He has a dysphoric affect.  He denies homicidal ideations.  He denies auditory/visual hallucinations at this time.  However he endorses often having auditory hallucinations that are so intense that it makes him want to kill himself.  He describes these auditory hallucinations of being whispers of many people that tell him that he is worthless and to kill himself.  He denies visual hallucinations. He admits that recently he has seen evil dark shadows.  He is paranoid.  States he feels that he is being watched at all times.  He believes that he has been recorded through his cell phone.  At this time he does not appear to be responding to internal/external stimuli.  Collateral obtained by Sydell Axon [facility],  "Paitent's mother, [name], was interviewed.  She reports patient has been more agitated recently and he has not slept in 3 days.  She states patient has told her he is worried he may harm her and other family members, although he has no plan or intent to do so.  Upon discussion of current symptoms, patient's mother states "it's just so much."  She reports he has been speaking about evil spirits  and "voodoo."  He has also recently mentioned various concerns about his medications and reasons he may need to discontinue taking them.  She believes he has remained medication compliant, however she isn't certain.  She reports he did well when he was involved with [facility].  She states he "fired" them, as he did not like Dr. Guss Bunde.  She is concerned for patient's safety and the safety of her family at this point, especially if patient's symptoms  continue to worsen.  She is supportive of recommendation for inpatient treatment "  Total Time spent with patient: 45 minutes  Musculoskeletal  Strength & Muscle Tone: within normal limits Gait & Station: normal Patient leans: N/A  Psychiatric Specialty Exam  Presentation General Appearance:  Casual  Eye Contact: Good  Speech: Clear and Coherent; Normal Rate  Speech Volume: Normal  Handedness: Right   Mood and Affect  Mood: Worthless; Hopeless  Affect: Congruent   Thought Process  Thought Processes: Coherent  Descriptions of Associations:Intact  Orientation:Full (Time, Place and Person)  Thought Content:Delusions; Paranoid Ideation  Diagnosis of Schizophrenia or Schizoaffective disorder in past: Yes  Duration of Psychotic Symptoms: Greater than six months  Hallucinations:Hallucinations: None  Ideas of Reference:Paranoia  Suicidal Thoughts:Suicidal Thoughts: Yes, Active SI Active Intent and/or Plan: With Intent; With Plan; With Means to Carry Out  Homicidal Thoughts:Homicidal Thoughts: No   Sensorium  Memory: Immediate Fair; Remote Fair; Recent Fair  Judgment: Poor  Insight: Poor   Executive Functions  Concentration: Fair  Attention Span: Fair  Recall: [employer] of Knowledge: Fair  Language: Fair   Psychomotor Activity  Psychomotor Activity: Psychomotor Activity: Normal   Assets  Assets: Manufacturing systems engineer; Desire for Improvement; Financial Resources/Insurance; Physical Health; Resilience; Social Support   Sleep  Sleep: Sleep: Poor Number of Hours of Sleep: 0   Nutritional Assessment (For OBS and FBC admissions only) Has the patient had a weight loss or gain of 10 pounds or more in the last 3 months?: No Has the patient had a decrease in food intake/or appetite?: Yes Does the patient have dental problems?: No Does the patient have eating habits or behaviors that may be indicators of an eating disorder including  binging or inducing vomiting?: No Has the patient recently lost weight without trying?: 2.0 Has the patient been eating poorly because of a decreased appetite?: 1 Malnutrition Screening Tool Score: 3    Physical Exam Vitals and nursing note reviewed.  Constitutional:      Appearance: Normal appearance.  HENT:     Head: Normocephalic.     Right Ear: External ear normal.     Left Ear: External ear normal.  Eyes:     Conjunctiva/sclera: Conjunctivae normal.  Cardiovascular:     Rate and Rhythm: Normal rate.  Musculoskeletal:        General: Normal range of motion.     Cervical back: Normal range of motion.  Skin:    Coloration: Skin is not jaundiced or pale.  Neurological:     Mental Status: He is alert and oriented to person, place, and time.  Psychiatric:        Attention and Perception: Attention and perception normal.        Mood and Affect: Mood is depressed.        Speech: Speech normal.        Behavior: Behavior is cooperative.        Thought Content: Thought content is paranoid and delusional. Thought content includes suicidal  ideation.        Cognition and Memory: Cognition normal.        Judgment: Judgment is impulsive.    Review of Systems  Constitutional: Negative.   HENT: Negative.    Eyes: Negative.   Respiratory: Negative.    Cardiovascular: Negative.   Genitourinary: Negative.   Musculoskeletal: Negative.   Skin: Negative.   Neurological: Negative.   Psychiatric/Behavioral:  Positive for depression and suicidal ideas. The patient has insomnia.     Blood pressure 132/88, pulse 99, temperature 98.3 F (36.8 C), resp. rate 18, SpO2 98%. There is no height or weight on file to calculate BMI.  Past Psychiatric History: Patient reports multiple inpatient psychiatric admissions with his last inpatient admission being at: [facility] 11/2020.Per chart review patient has a past psychiatric history of paranoid schizophrenia, suicidal ideations, psychosis, auditory  hallucinations.  He has a past medical history that includes prediabetes, morbid obesity, and rhabdomyolysis  Is the patient at risk to self? Yes  Has the patient been a risk to self in the past 6 months? Yes .    Has the patient been a risk to self within the distant past? Yes   Is the patient a risk to others? No   Has the patient been a risk to others in the past 6 months? No   Has the patient been a risk to others within the distant past? No   Past Medical History:  Past Medical History:  Diagnosis Date   Elevated CPK    per patient   Schizophrenia (HCC)      Family History:   Mental illness-brother  Social History:   Denies all substance use Unemployed receives SSI check due to mental illness Lives at home with mother, stepfather, and brother   Last Labs:  Admission on 12/17/2022, Discharged on 12/17/2022  Component Date Value Ref Range Status   WBC 12/17/2022 4.8  4.0 - 10.5 K/uL Final   RBC 12/17/2022 4.74  4.22 - 5.81 MIL/uL Final   Hemoglobin 12/17/2022 14.1  13.0 - 17.0 g/dL Final   HCT 14/78/2956 42.9  39.0 - 52.0 % Final   MCV 12/17/2022 90.5  80.0 - 100.0 fL Final   MCH 12/17/2022 29.7  26.0 - 34.0 pg Final   MCHC 12/17/2022 32.9  30.0 - 36.0 g/dL Final   RDW 21/30/8657 13.8  11.5 - 15.5 % Final   Platelets 12/17/2022 213  150 - 400 K/uL Final   nRBC 12/17/2022 0.0  0.0 - 0.2 % Final   Neutrophils Relative % 12/17/2022 52  % Final   Neutro Abs 12/17/2022 2.5  1.7 - 7.7 K/uL Final   Lymphocytes Relative 12/17/2022 36  % Final   Lymphs Abs 12/17/2022 1.7  0.7 - 4.0 K/uL Final   Monocytes Relative 12/17/2022 10  % Final   Monocytes Absolute 12/17/2022 0.5  0.1 - 1.0 K/uL Final   Eosinophils Relative 12/17/2022 2  % Final   Eosinophils Absolute 12/17/2022 0.1  0.0 - 0.5 K/uL Final   Basophils Relative 12/17/2022 0  % Final   Basophils Absolute 12/17/2022 0.0  0.0 - 0.1 K/uL Final   Immature Granulocytes 12/17/2022 0  % Final   Abs Immature Granulocytes  12/17/2022 0.01  0.00 - 0.07 K/uL Final   Performed at [facility], [address], [place], [place] [address]   Sodium 12/17/2022 136  135 - 145 mmol/L Final   Potassium 12/17/2022 3.6  3.5 - 5.1 mmol/L Final  Chloride 12/17/2022 100  98 - 111 mmol/L Final   CO2 12/17/2022 26  22 - 32 mmol/L Final   Glucose, Bld 12/17/2022 114 (H)  70 - 99 mg/dL Final   Glucose reference range applies only to samples taken after fasting for at least 8 hours.   BUN 12/17/2022 11  6 - 20 mg/dL Final   Creatinine, Ser 12/17/2022 0.97  0.61 - 1.24 mg/dL Final   Calcium 95/28/4132 9.2  8.9 - 10.3 mg/dL Final   Total Protein 44/04/270 7.7  6.5 - 8.1 g/dL Final   Albumin 53/66/4403 3.7  3.5 - 5.0 g/dL Final   AST 47/42/5956 45 (H)  15 - 41 U/L Final   ALT 12/17/2022 31  0 - 44 U/L Final   Alkaline Phosphatase 12/17/2022 58  38 - 126 U/L Final   Total Bilirubin 12/17/2022 0.9  0.3 - 1.2 mg/dL Final   GFR, Estimated 12/17/2022 >60  >60 mL/min Final   Comment: (NOTE) Calculated using the CKD-EPI Creatinine Equation (2021)    Anion gap 12/17/2022 10  5 - 15 Final   Performed at [facility], [address] [place], [place] [address]   Hgb A1c MFr Bld 12/17/2022 6.1 (H)  4.8 - 5.6 % Final   Comment: (NOTE) Pre diabetes:          5.7%-6.4%  Diabetes:              >6.4%  Glycemic control for   <7.0% adults with diabetes    Mean Plasma Glucose 12/17/2022 128.37  mg/dL Final   Performed at [facility], [address], [place], [place] [address]   Magnesium 12/17/2022 2.0  1.7 - 2.4 mg/dL Final   Performed at [facility], [address] [place], [place] [address]   Alcohol, Ethyl (B) 12/17/2022 <10  <10 mg/dL Final   Comment: (NOTE) Lowest detectable limit for serum alcohol is 10 mg/dL.  For medical purposes only. Performed at [facility], [address] [place], [place] [address]    Cholesterol 12/17/2022 154  0 - 200 mg/dL Final   Triglycerides 30/16/0109 70  <150  mg/dL Final   HDL 32/35/5732 40 (L)  >40 mg/dL Final   Total CHOL/HDL Ratio 12/17/2022 3.9  RATIO Final   VLDL 12/17/2022 14  0 - 40 mg/dL Final   LDL Cholesterol 12/17/2022 100 (H)  0 - 99 mg/dL Final   Comment:        Total Cholesterol/HDL:CHD Risk Coronary Heart Disease Risk Table                     Men   Women  1/2 Average Risk   3.4   3.3  Average Risk       5.0   4.4  2 X Average Risk   9.6   7.1  3 X Average Risk  23.4   11.0        Use the calculated Patient Ratio above and the CHD Risk Table to determine the patient's CHD Risk.        ATP III CLASSIFICATION (LDL):  <100     mg/dL   Optimal  202-542  mg/dL   Near or Above                    Optimal  130-159  mg/dL   Borderline  706-237  mg/dL   High  >628     mg/dL  Very High Performed at [facility], [address], [place], [place] [address]    TSH 12/17/2022 4.096  0.350 - 4.500 uIU/mL Final   Comment: Performed by a 3rd Generation assay with a functional sensitivity of <=0.01 uIU/mL. Performed at [facility], [address] [place], [place] [address]    POC Amphetamine UR 12/17/2022 None Detected  NONE DETECTED (Cut Off Level 1000 ng/mL) Final   POC Secobarbital (BAR) 12/17/2022 None Detected  NONE DETECTED (Cut Off Level 300 ng/mL) Final   POC Buprenorphine (BUP) 12/17/2022 None Detected  NONE DETECTED (Cut Off Level 10 ng/mL) Final   POC Oxazepam (BZO) 12/17/2022 None Detected  NONE DETECTED (Cut Off Level 300 ng/mL) Final   POC Cocaine UR 12/17/2022 None Detected  NONE DETECTED (Cut Off Level 300 ng/mL) Final   POC Methamphetamine UR 12/17/2022 None Detected  NONE DETECTED (Cut Off Level 1000 ng/mL) Final   POC Morphine 12/17/2022 None Detected  NONE DETECTED (Cut Off Level 300 ng/mL) Final   POC Methadone UR 12/17/2022 None Detected  NONE DETECTED (Cut Off Level 300 ng/mL) Final   POC Oxycodone UR 12/17/2022 None Detected  NONE DETECTED (Cut Off Level 100 ng/mL) Final   POC Marijuana UR  12/17/2022 None Detected  NONE DETECTED (Cut Off Level 50 ng/mL) Final   Valproic Acid Lvl 12/17/2022 19 (L)  50.0 - 100.0 ug/mL Final   Performed at [facility], [address], [place], [place] [address]  Office Visit on 07/24/2022  Component Date Value Ref Range Status   WBC 07/24/2022 4.6  4.0 - 10.5 K/uL Final   RBC 07/24/2022 4.54  4.22 - 5.81 Mil/uL Final   Hemoglobin 07/24/2022 14.0  13.0 - 17.0 g/dL Final   HCT 25/36/6440 41.9  39.0 - 52.0 % Final   MCV 07/24/2022 92.2  78.0 - 100.0 fl Final   MCHC 07/24/2022 33.5  30.0 - 36.0 g/dL Final   RDW 34/74/2595 14.3  11.5 - 15.5 % Final   Platelets 07/24/2022 229.0  150.0 - 400.0 K/uL Final   Neutrophils Relative % 07/24/2022 52.2  43.0 - 77.0 % Final   Lymphocytes Relative 07/24/2022 35.2  12.0 - 46.0 % Final   Monocytes Relative 07/24/2022 10.7  3.0 - 12.0 % Final   Eosinophils Relative 07/24/2022 1.3  0.0 - 5.0 % Final   Basophils Relative 07/24/2022 0.6  0.0 - 3.0 % Final   Neutro Abs 07/24/2022 2.4  1.4 - 7.7 K/uL Final   Lymphs Abs 07/24/2022 1.6  0.7 - 4.0 K/uL Final   Monocytes Absolute 07/24/2022 0.5  0.1 - 1.0 K/uL Final   Eosinophils Absolute 07/24/2022 0.1  0.0 - 0.7 K/uL Final   Basophils Absolute 07/24/2022 0.0  0.0 - 0.1 K/uL Final   Sodium 07/24/2022 139  135 - 145 mEq/L Final   Potassium 07/24/2022 3.8  3.5 - 5.1 mEq/L Final   Chloride 07/24/2022 101  96 - 112 mEq/L Final   CO2 07/24/2022 31  19 - 32 mEq/L Final   Glucose, Bld 07/24/2022 91  70 - 99 mg/dL Final   BUN 63/87/5643 14  6 - 23 mg/dL Final   Creatinine, Ser 07/24/2022 1.47  0.40 - 1.50 mg/dL Final   Total Bilirubin 07/24/2022 0.4  0.2 - 1.2 mg/dL Final   Alkaline Phosphatase 07/24/2022 67  39 - 117 U/L Final   AST 07/24/2022 29  0 - 37 U/L Final   ALT 07/24/2022 23  0 - 53  U/L Final   Total Protein 07/24/2022 7.9  6.0 - 8.3 g/dL Final   Albumin 44/06/4740 4.2  3.5 - 5.2 g/dL Final   GFR 59/56/3875 65.41  >60.00 mL/min Final   Calculated using  the CKD-EPI Creatinine Equation (2021)   Calcium 07/24/2022 9.5  8.4 - 10.5 mg/dL Final   TSH 64/33/2951 1.89  0.35 - 5.50 uIU/mL Final   Hgb A1c MFr Bld 07/24/2022 6.1  4.6 - 6.5 % Final   Glycemic Control Guidelines for People with Diabetes:Non Diabetic:  <6%Goal of Therapy: <7%Additional Action Suggested:  >8%    Cholesterol 07/24/2022 144  0 - 200 mg/dL Final   ATP III Classification       Desirable:  < 200 mg/dL               Borderline High:  200 - 239 mg/dL          High:  > = 884 mg/dL   Triglycerides 16/60/6301 238.0 (H)  0.0 - 149.0 mg/dL Final   Normal:  <601 mg/dLBorderline High:  150 - 199 mg/dL   HDL 09/32/3557 32.20 (L)  >25.42 mg/dL Final   VLDL 70/62/3762 47.6 (H)  0.0 - 40.0 mg/dL Final   Total CHOL/HDL Ratio 07/24/2022 4   Final                  Men          Women1/2 Average Risk     3.4          3.3Average Risk          5.0          4.42X Average Risk          9.6          7.13X Average Risk          15.0          11.0                       NonHDL 07/24/2022 107.51   Final   NOTE:  Non-HDL goal should be 30 mg/dL higher than patient's LDL goal (i.e. LDL goal of < 70 mg/dL, would have non-HDL goal of < 100 mg/dL)   Valproic Acid Lvl 83/15/1761 <12.5 (L)  50.0 - 100.0 mg/L Final   Comment: Verified by repeat analysis. .    Vitamin B-12 07/24/2022 663  211 - 911 pg/mL Final   VITD 07/24/2022 43.51  30.00 - 100.00 ng/mL Final   Total CK 07/24/2022 1,075 (H)  7 - 232 U/L Final   Direct LDL 07/24/2022 85.0  mg/dL Final   Optimal:  <607 mg/dLNear or Above Optimal:  100-129 mg/dLBorderline High:  130-159 mg/dLHigh:  160-189 mg/dLVery High:  >190 mg/dL    Allergies: Patient has no known allergies.  Medications:  Facility Ordered Medications  Medication   acetaminophen (TYLENOL) tablet 650 mg   alum & mag hydroxide-simeth (MAALOX/MYLANTA) 200-200-20 MG/5ML suspension 30 mL   divalproex (DEPAKOTE ER) 24 hr tablet 500 mg   hydrOXYzine (ATARAX) tablet 25 mg   magnesium  hydroxide (MILK OF MAGNESIA) suspension 30 mL   [START ON 12/18/2022] venlafaxine XR (EFFEXOR-XR) 24 hr capsule 37.5 mg   haloperidol (HALDOL) tablet 5 mg   Or   haloperidol lactate (HALDOL) injection 5 mg   LORazepam (ATIVAN) tablet 2 mg   Or   LORazepam (ATIVAN) injection 2 mg   diphenhydrAMINE (BENADRYL) capsule 50 mg   Or  diphenhydrAMINE (BENADRYL) injection 50 mg   metFORMIN (GLUCOPHAGE) tablet 500 mg   [START ON 12/18/2022] multivitamin with minerals tablet 1 tablet   traZODone (DESYREL) tablet 50 mg   [START ON 12/18/2022] vitamin D3 (CHOLECALCIFEROL) tablet 10,000 Units   PTA Medications  Medication Sig   INVEGA TRINZA 546 MG/1.75ML injection    Semaglutide,0.25 or 0.5MG /DOS, (OZEMPIC, 0.25 OR 0.5 MG/DOSE,) 2 MG/3ML SOPN DIAL AND INJECT UNDER THE SKIN 0.5 MG WEEKLY   divalproex (DEPAKOTE ER) 500 MG 24 hr tablet    metFORMIN (GLUCOPHAGE) 500 MG tablet Take 1 tablet (500 mg total) by mouth 2 (two) times daily with a meal. :Medication induced wt management   benztropine (COGENTIN) 0.5 MG tablet Take 0.25 mg by mouth 2 (two) times daily.   triamcinolone cream (KENALOG) 0.1 % Apply 1 Application topically 2 (two) times daily.   venlafaxine XR (EFFEXOR-XR) 37.5 MG 24 hr capsule Take 37.5 mg by mouth daily.      Medical Decision Making  Patient presents to [facility] with SI and can not contract for safety.  He is recommended for inpatient psychiatric admission.  He will be admitted to the continuous assessment unit while awaiting inpatient psychiatric bed availability    Recommendations  Based on my evaluation the patient does not appear to have an emergency medical condition.  Pt is recommended for inpatient psychiatric admission.  He will be admitted to the continuous assessment unit while awaiting inpatient psychiatric bed availability  Lab Orders         CBC with Differential/Platelet         Comprehensive metabolic panel         Hemoglobin A1c         Magnesium          Ethanol         Lipid panel         TSH         Valproic acid level         POCT Urine Drug Screen - (I-Screen)      EKG  Continue home medications: Depakote 500 mg nightly, Effexor 37.5 mg daily, multivitamin daily, metformin 500 mg with meals twice daily, vitamin D3 10,000 units daily. Invega injection monthly-unsure of date last given.   Ardis Hughs, NP 12/17/22  3:08 PM

## 2022-12-17 NOTE — ED Notes (Signed)
Patient A&O X 4, ambulatory. Pt discharged in no acute distress. Pt denies SI/HI/AVS upon discharge. Patient verbalized understanding of discharge to facility. Patient belongings returned form locker # [id number]. Pt escorted to back [employer] by staff for transport to facility via [employer]. Safety maintained

## 2022-12-17 NOTE — BH Assessment (Addendum)
Comprehensive Clinical Assessment (CCA) Note  12/17/2022 Noah Ramsey [id number]   Disposition: Per Noah Gambles, NP inpatient treatment is recommended.  [facility] to review.  Disposition SW to pursue appropriate inpatient options.  The patient demonstrates the following risk factors for suicide: Chronic risk factors for suicide include: psychiatric disorder of Schizophrenia, unspecified and previous suicide attempts "multiple non-lethal attempts" . Acute risk factors for suicide include: social withdrawal/isolation. Protective factors for this patient include: positive social support, positive therapeutic relationship, responsibility to others (children, family), and hope for the future. Considering these factors, the overall suicide risk at this point appears to be moderate. Patient is appropriate for outpatient follow up, once stabilized.   Patient is a 27 year old male with a history of Schizophrenia, unspecified type who presents voluntarily to [facility] for assessment.  On arrival, patient states he tried to commit suicide "6 hours ago."  He struggles to elaborate as to his mode of attempt and eventually states he has been trying to starve himself.  He reports his mother "forced me to come here.  They are worried I am delirious, odd with extreme behavior."  He reports he "must be in purgatory.  I'm a tortured soul."  Patient reports multiple suicide attempts over "the years" to include dehydrating and over hydrating with water, sitting in a car in extreme heat."  He states nothing has worked, leading him to believe he is in Noah Ramsey and can't escape. Patient states he wants to be a part of the undead (a zombie). Pt states that he believes he is a Forensic psychologist," that he describes is the most evil of evil spirits in "demonology."   Patient has largely engaged in non-lethal suicide attempts, and he believes if he were released, he would "probably" continue these sorts of attempts.   Patient denies HI, AVH or SA hx.  Patient is engaged in outpatient treatment.  He sees Noah Ramsey for Noah Ramsey.  He has a therapist with Noah Ramsey that he sees once a month, virtually.  Patient gives verbal consent for clinician to contact his mother for collateral.    Paitent's mother, Noah Ramsey, was interviewed.  She reports patient has been more agitated recently and he has not slept in 3 days.  She states patient has told her he is worried he may harm her and other family members, although he has no plan or intent to do so.  Upon discussion of current symptoms, patient's mother states "it's just so much."  She reports he has been speaking about evil spirits and "voodoo."  He has also recently mentioned various concerns about his medications and reasons he may need to discontinue taking them.  She believes he has remained medication compliant, however she isn't certain.  She reports he did well when he was involved with [facility].  She states he "fired" them, as he did not like Noah Ramsey.  She is concerned for patient's safety and the safety of her family at this point, especially if patient's symptoms continue to worsen.  She is supportive of recommendation for inpatient treatment.    Chief Complaint:  Chief Complaint  Patient presents with   Suicidal   Visit Diagnosis: Schizophrenia, unspecified type    CCA Screening, Triage and Referral (STR)  Patient Reported Information How did you hear about [place]? Self  What Is the Reason for Your Visit/Call Today? Pt arrived to [facility] voluntarily unaccompanied by anyone. Pt states that he tried to commit suicide. Pt  states that he tried to dehydrate and starve himself. Pt states that he drinks a lot of water, around 1-4 gallons of water a day to drown himself. Pt states he did drunk an excessive amount of water for 2 years. Pt states he wants to be a part of the undead (a zombie). Pt states he has an overeating issues. Pt states  that he believes he is a "wendigo". Pt states that he feels he is cursed by his ancesters. Pt states that he believes he is already dead. Pt states that he use to take 15-20 vitamins a day. Pt endorses SI at this present time. Pt denies HI, AVH, and alcohol & drugs at this current moment. Pt states that he has a therapist & psychiatrist with [facility].  How Long Has This Been Causing You Problems? > than 6 months  What Do You Feel Would Help You the Most Today? Treatment for Depression or other mood problem; Medication(s)   Have You Recently Had Any Thoughts About Hurting Yourself? Yes  Are You Planning to Commit Suicide/Harm Yourself At This time? No   Flowsheet Row ED from 12/17/2022 in [facility] from 11/04/2022 in [facility] Department at [facility] ED from [facility]/03/2022 in [facility] Emergency Department at [facility]  C-SSRS RISK CATEGORY Moderate Risk No Risk No Risk       Have you Recently Had Thoughts About Hurting Someone Noah Ramsey? No  Are You Planning to Harm Someone at This Time? No  Explanation: N/A   Have You Used Any Alcohol or Drugs in the Past 24 Hours? No  What Did You Use and How Much? N/A   Do You Currently Have a Therapist/Psychiatrist? Yes  Name of Therapist/Psychiatrist: Name of Therapist/Psychiatrist: Patient sees Noah Ramsey at [place] for med management.  He sees a therapist with Noah Ramsey monthly.   Have You Been Recently Discharged From Any Office Practice or Programs? No  Explanation of Discharge From Practice/Program: N/A     CCA Screening Triage Referral Assessment Type of Contact: Face-to-Face  Telemedicine Service Delivery:   Is this Initial or Reassessment?   Date Telepsych consult ordered in [facility]:    Time Telepsych consult ordered in [facility]:    Location of Assessment: [facility]  Provider Location: [facility] Assessment Services   Collateral Involvement: Mother,  Donald Nordmark, provided collateral.   Does Patient Have a Court Appointed Legal Guardian? No  Legal Guardian Contact Information: N/A  Copy of Legal Guardianship Form: -- (N/A)  Legal Guardian Notified of Arrival: -- (N/A)  Legal Guardian Notified of Pending Discharge: -- (N/A)  If Minor and Not Living with Parent(s), Who has Custody? N/A  Is CPS involved or ever been involved? Never  Is APS involved or ever been involved? Never   Patient Determined To Be At Risk for Harm To Self or Others Based on Review of Patient Reported Information or Presenting Complaint? Yes, for Harm to Others  Method: No Plan  Availability of Means: No access or NA  Intent: Vague intent or NA  Notification Required: Identifiable person is aware  Additional Information for Danger to Others Potential: Active psychosis  Additional Comments for Danger to Others Potential: None  Are There Guns or Other Weapons in Your Home? No  Types of Guns/Weapons: N/A  Are These Weapons Safely Secured?                            -- (  N/A)  Who Could Verify You Are Able To Have These Secured: N/A  Do You Have any Outstanding Charges, Pending Court Dates, Parole/Probation? None  Contacted To Inform of Risk of Harm To Self or Others: Family/Significant Other:    Does Patient Present under Involuntary Commitment? No    [place] of Residence: [place]   Patient Currently Receiving the Following Services: Medication Management; Individual Therapy   Determination of Need: Urgent (48 hours)   Options For Referral: Medication Management; Outpatient Therapy; Inpatient Hospitalization     CCA Biopsychosocial Patient Reported Schizophrenia/Schizoaffective Diagnosis in Past: Yes   Strengths: Patient is seeking treatment, he is engaged in outpt treatment and has family support.   Mental Health Symptoms Depression:   Irritability; Hopelessness; Fatigue; Difficulty Concentrating; Sleep (too much or  little); Worthlessness (Isolation, frustrated. Pt reports, he blames himself because he give his parents a hard time due to his condition (Schizophrenia.))   Duration of Depressive symptoms:  Duration of Depressive Symptoms: Greater than two weeks   Mania:   None   Anxiety:    Worrying; Tension   Psychosis:   Hallucinations; Delusions   Duration of Psychotic symptoms:  Duration of Psychotic Symptoms: Greater than six months   Trauma:   None   Obsessions:   None   Compulsions:   None   Inattention:   N/A   Hyperactivity/Impulsivity:   N/A   Oppositional/Defiant Behaviors:   N/A   Emotional Irregularity:   Chronic feelings of emptiness; Recurrent suicidal behaviors/gestures/threats; Mood lability   Other Mood/Personality Symptoms:   None noted    Mental Status Exam Appearance and self-care  Stature:   Average   Weight:   Overweight   Clothing:   Casual (Pt in hospital gown.)   Grooming:   Normal   Cosmetic use:   None   Posture/gait:   Normal   Motor activity:   Not Remarkable   Sensorium  Attention:   Normal   Concentration:   Normal   Orientation:   X5   Recall/memory:   Normal   Affect and Mood  Affect:   Depressed   Mood:   Depressed   Relating  Eye contact:   Normal   Facial expression:   Depressed   Attitude toward examiner:   Cooperative   Thought and Language  Speech flow:  Normal   Thought content:   Delusions; Suspicious   Preoccupation:   None   Hallucinations:   Auditory   Organization:   Disorganized   Company secretary of Knowledge:   Fair   Intelligence:   Average   Abstraction:   Abstract   Judgement:   Fair   Dance movement psychotherapist:   Distorted   Insight:   Gaps; Lacking   Decision Making:   Confused; Vacilates   Social Functioning  Social Maturity:   Isolates   Social Judgement:   Naive (UTA)   Stress  Stressors:   Other (Comment) (mental health issues)    Coping Ability:   Overwhelmed   Skill Deficits:   Decision making; Self-control   Supports:   Family     Religion: Religion/Spirituality Are You A Religious Person?: Yes What is Your Religious Affiliation?: Christian How Might This Affect Treatment?: Not assessed  Leisure/Recreation: Leisure / Recreation Do You Have Hobbies?: Yes Leisure and Hobbies: Patient enjoys watching YouTube videos.  Exercise/Diet: Exercise/Diet Do You Exercise?: No Have You Gained or Lost A Significant Amount of Weight in the Past Six Months?: No Do You  Follow a Special Diet?: No Do You Have Any Trouble Sleeping?: Yes Explanation of Sleeping Difficulties: Patient states he hasn't slept much at all for the past 3 days.   CCA Employment/Education Employment/Work Situation: Employment / Work Situation Employment Situation: On disability Why is Patient on Disability: Schizophrenia. How Long has Patient Been on Disability: Per pt, since 2022. Patient's Job has Been Impacted by Current Illness: No Has Patient ever Been in the [employer]?: No  Education: Education Is Patient Currently Attending School?: No Last Grade Completed: 12 Did You Attend College?: No Did You Have An Individualized Education Program (IIEP): No (UTA) Did You Have Any Difficulty At School?: No (UTA) Patient's Education Has Been Impacted by Current Illness: No   CCA Family/Childhood History Family and Relationship History: Family history Marital status: Single Does patient have children?: No  Childhood History:  Childhood History By whom was/is the patient raised?: Both parents Did patient suffer any verbal/emotional/physical/sexual abuse as a child?: No Did patient suffer from severe childhood neglect?: No Has patient ever been sexually abused/assaulted/raped as an adolescent or adult?: No Was the patient ever a victim of a crime or a disaster?: No Witnessed domestic violence?: No Has patient been affected by  domestic violence as an adult?: No (NA)       CCA Substance Use Alcohol/Drug Use: Alcohol / Drug Use Pain Medications: See MAR Prescriptions: See MAR Over the Counter: See MAR History of alcohol / drug use?: No history of alcohol / drug abuse Longest period of sobriety (when/how long): N/A Negative Consequences of Use:  (N/A) Withdrawal Symptoms:  (N/A)                         ASAM's:  Six Dimensions of Multidimensional Assessment  Dimension 1:  Acute Intoxication and/or Withdrawal Potential:      Dimension 2:  Biomedical Conditions and Complications:      Dimension 3:  Emotional, Behavioral, or Cognitive Conditions and Complications:     Dimension 4:  Readiness to Change:     Dimension 5:  Relapse, Continued use, or Continued Problem Potential:     Dimension 6:  Recovery/Living Environment:     ASAM Severity Score:    ASAM Recommended Level of Treatment: ASAM Recommended Level of Treatment:  (N/A)   Substance use Disorder (SUD) Substance Use Disorder (SUD)  Checklist Symptoms of Substance Use:  (N/A)  Recommendations for Services/Supports/Treatments: Recommendations for Services/Supports/Treatments Recommendations For Services/Supports/Treatments: Other (Comment) (Pt to be observed and reassessed by psychiatry.)  Discharge Disposition:    DSM5 Diagnoses: Patient Active Problem List   Diagnosis Date Noted   Bipolar 1 disorder, depressed, moderate (HCC) 07/19/2022   Prediabetes 05/31/2021   Morbid obesity (HCC) 05/31/2021   Suicidal ideation    Schizophrenia, schizoaffective, chronic with acute exacerbation (HCC) 01/30/2021   Psychosis (HCC) 12/03/2020   Auditory hallucinations    Schizophrenia, 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

## 2022-12-17 NOTE — Progress Notes (Signed)
   12/17/22 0700  [facility]UC Triage Screening (Walk-ins at [facility] only)  How Did You Hear About [place]? Self  What Is the Reason for Your Visit/Call Today? Pt arrived to [facility] voluntarily unaccompanied by anyone. Pt states that he tried to commit suicide. Pt states that he tried to dehydrate and starve himself. Pt states that he drinks a lot of water, around 1-4 gallons of water a day to drown himself. Pt states he did drunk an excessive amount of water for 2 years. Pt states he wants to be a part of the undead (a zombie). Pt states he has an overeating issues. Pt states that he believes he is a "wendigo". Pt states that he feels he is cursed by his ancesters. Pt states that he believes he is already dead. Pt states that he use to take 15-20 vitamins a day. Pt endorses SI at this present time. Pt denies HI, AVH, and alcohol & drugs at this current moment. Pt states that he has a therapist & psychiatrist with [facility].  How Long Has This Been Causing You Problems? > than 6 months  Have You Recently Had Any Thoughts About Hurting Yourself? Yes  How long ago did you have thoughts about hurting yourself? every minute  Are You Planning to Commit Suicide/Harm Yourself At This time? No  Have you Recently Had Thoughts About Hurting Someone [name]? No  Are You Planning To Harm Someone At This Time? No  Are you currently experiencing any auditory, visual or other hallucinations? No  Have You Used Any Alcohol or Drugs in the Past 24 Hours? No  Do you have any current medical co-morbidities that require immediate attention? No  Clinician description of patient physical appearance/behavior: casually dress, calm and cooperative  What Do You Feel Would Help You the Most Today? Treatment for Depression or other mood problem;Medication(s)  Determination of Need Urgent (48 hours)  Options For Referral Intensive Outpatient Therapy;Medication Management;Outpatient Therapy;Inpatient Hospitalization

## 2022-12-17 NOTE — Tx Team (Signed)
Initial Treatment Plan 12/17/2022 6:54 PM Noah Ramsey [id number]    PATIENT STRESSORS: Other: Mental issues     PATIENT STRENGTHS: Physical Health  Supportive family/friends    PATIENT IDENTIFIED PROBLEMS:                      DISCHARGE CRITERIA:  Improved stabilization in mood, thinking, and/or behavior  PRELIMINARY DISCHARGE PLAN: Participate in family therapy Return to previous work or school arrangements  PATIENT/FAMILY INVOLVEMENT: This treatment plan has been presented to and reviewed with the patient, Noah Ramsey, has been given the opportunity to ask questions and make suggestions.  Melvenia Needles, RN 12/17/2022, 6:54 PM

## 2022-12-17 NOTE — Progress Notes (Signed)
Pt has been accepted to [facility] TODAY 12/17/2022, pending labs, EKG, and voluntary consent. Bed assignment: 500-1  Pt meets inpatient criteria per Vernard Gambles, NP  Attending Physician will be Phineas Inches, MD  Report can be called to: - Adult unit: [phone]  Pt can arrive after pending items and discharges are complete  Care Team Notified: [facility] Rona Ravens, RN, Su Grand, RN, Vernard Gambles, NP, and Tyrell Antonio, RN  [place], [place]  12/17/2022 9:57 AM

## 2022-12-17 NOTE — Plan of Care (Signed)

## 2022-12-17 NOTE — Discharge Instructions (Addendum)
Transfer to [facility] for IP admission. Dr. Sherron Flemings is the accepting md

## 2022-12-17 NOTE — Plan of Care (Signed)
  Problem: Activity: Goal: Interest or engagement in activities will improve Outcome: Progressing Goal: Sleeping patterns will improve Outcome: Progressing   Problem: Safety: Goal: Periods of time without injury will increase Outcome: Progressing   

## 2022-12-17 NOTE — Progress Notes (Signed)
Pt remembered writer from previous admission and thanked Clinical research associate for helping him in the past, pt continues to be paranoid and delusional on the unit, but pleasant    12/17/22 2145  Psych Admission Type (Psych Patients Only)  Admission Status Voluntary  Psychosocial Assessment  Patient Complaints Anxiety;Suspiciousness;Worrying;Depression  Eye Contact Fair  Facial Expression Flat  Affect Sad  Speech Logical/coherent  Interaction Assertive  Motor Activity Slow  Appearance/Hygiene In hospital gown  Behavior Characteristics Cooperative  Mood Pleasant  Aggressive Behavior  Effect No apparent injury  Thought Process  Coherency Circumstantial  Content Blaming self;Preoccupation  Delusions Paranoid  Perception Depersonalization;Hallucinations  Hallucination Auditory  Judgment WDL  Confusion None  Danger to Self  Current suicidal ideation? Passive  Agreement Not to Harm Self Yes  Description of Agreement Verbal commitment for safety  Danger to Others  Danger to Others None reported or observed

## 2022-12-17 NOTE — ED Notes (Signed)
Patient resting quietly inchair watching TV. Respirations equal and unlabored, skin warm and dry, NAD. Routine safety checks conducted according to facility protocol. Will continue to monitor for safety.

## 2022-12-18 ENCOUNTER — Encounter (HOSPITAL_COMMUNITY): Payer: Self-pay

## 2022-12-18 DIAGNOSIS — F203 Undifferentiated schizophrenia: Secondary | ICD-10-CM

## 2022-12-18 MED ORDER — BENZTROPINE MESYLATE 0.5 MG PO TABS
0.5000 mg | ORAL_TABLET | Freq: Two times a day (BID) | ORAL | Status: DC
  Administered 2022-12-18 – 2022-12-26 (×16): 0.5 mg via ORAL
  Filled 2022-12-18: qty 6
  Filled 2022-12-18 (×10): qty 1
  Filled 2022-12-18: qty 6
  Filled 2022-12-18 (×8): qty 1

## 2022-12-18 MED ORDER — PROPRANOLOL HCL 10 MG PO TABS
10.0000 mg | ORAL_TABLET | Freq: Three times a day (TID) | ORAL | Status: DC
  Administered 2022-12-18 – 2022-12-26 (×25): 10 mg via ORAL
  Filled 2022-12-18 (×31): qty 1

## 2022-12-18 MED ORDER — HALOPERIDOL 5 MG PO TABS
10.0000 mg | ORAL_TABLET | Freq: Two times a day (BID) | ORAL | Status: DC
  Administered 2022-12-18 – 2022-12-26 (×16): 10 mg via ORAL
  Filled 2022-12-18 (×16): qty 2
  Filled 2022-12-18: qty 12
  Filled 2022-12-18: qty 2
  Filled 2022-12-18: qty 12
  Filled 2022-12-18 (×3): qty 2

## 2022-12-18 MED ORDER — DIVALPROEX SODIUM ER 500 MG PO TB24
1000.0000 mg | ORAL_TABLET | Freq: Every day | ORAL | Status: DC
  Administered 2022-12-18 – 2022-12-25 (×8): 1000 mg via ORAL
  Filled 2022-12-18 (×9): qty 2

## 2022-12-18 MED ORDER — VITAMIN D3 25 MCG PO TABS
1000.0000 [IU] | ORAL_TABLET | Freq: Every day | ORAL | Status: DC
  Administered 2022-12-19 – 2022-12-26 (×7): 1000 [IU] via ORAL
  Filled 2022-12-18 (×2): qty 1
  Filled 2022-12-18: qty 3
  Filled 2022-12-18 (×8): qty 1

## 2022-12-18 NOTE — Progress Notes (Signed)
   12/18/22 1025  Psych Admission Type (Psych Patients Only)  Admission Status Voluntary  Psychosocial Assessment  Patient Complaints None  Eye Contact Fair  Facial Expression Flat  Affect Depressed  Speech Logical/coherent  Interaction Assertive  Motor Activity Slow  Appearance/Hygiene Unremarkable  Behavior Characteristics Cooperative;Appropriate to situation  Mood Pleasant  Thought Process  Coherency Blocking  Content Blaming others;Magical thinking  Delusions WDL  Perception Hallucinations  Hallucination Auditory  Judgment WDL  Confusion None  Danger to Self  Current suicidal ideation? Denies  Self-Injurious Behavior No self-injurious ideation or behavior indicators observed or expressed   Agreement Not to Harm Self Yes  Description of Agreement verbal  Danger to Others  Danger to Others None reported or observed

## 2022-12-18 NOTE — [facility] Suicide Risk Assessment (Signed)
Suicide Risk Assessment  Admission Assessment    [facility] Admission Suicide Risk Assessment   Nursing information obtained from:  Patient  Demographic factors:  Male  Current Mental Status:  NA (Patient denies suicidal ideation)  Loss Factors:  NA  Historical Factors:  Prior suicide attempts  Risk Reduction Factors:  Sense of responsibility to family  Total Time spent with patient: 1 hour  Principal Problem: Paranoid schizophrenia (HCC)  Diagnosis:  Principal Problem:   Paranoid schizophrenia (HCC)  Subjective Data:   Continued Clinical Symptoms:  Alcohol Use Disorder Identification Test Final Score (AUDIT): 0 The "Alcohol Use Disorders Identification Test", Guidelines for Use in Primary Care, Second Edition.  World Science writer [facility]). Score between 0-7:  no or low risk or alcohol related problems. Score between 8-15:  moderate risk of alcohol related problems. Score between 16-19:  high risk of alcohol related problems. Score 20 or above:  warrants further diagnostic evaluation for alcohol dependence and treatment.  CLINICAL FACTORS:   Schizophrenia:   Command hallucinatons Less than 58 years old Paranoid or undifferentiated type Currently Psychotic Unstable or Poor Therapeutic Relationship Previous Psychiatric Diagnoses and Treatments Medical Diagnoses and Treatments/Surgeries  Musculoskeletal: Strength & Muscle Tone: within normal limits Gait & Station: normal Patient leans: N/A  Psychiatric Specialty Exam:  Presentation  General Appearance:  Casual; Fairly Groomed (obese)  Eye Contact: Fair  Speech: Clear and Coherent; Normal Rate  Speech Volume: Normal  Handedness: Right  Mood and Affect  Mood: -- ("Emotionless" per patientis report.)  Affect: Congruent  Thought Process  Thought Processes: Coherent; Irrevelant  Descriptions of Associations:Intact  Orientation:Full (Time, Place and Person)  Thought Content:Logical  History of  Schizophrenia/Schizoaffective disorder:Yes  Duration of Psychotic Symptoms:Greater than six months  Hallucinations:Hallucinations: Auditory Description of Auditory Hallucinations: "Voices telling me get you suck my dick. The voices are telling me to sexually assault you. kill myself & other people".  Ideas of Reference:Percusatory; Delusions  Suicidal Thoughts:Suicidal Thoughts: Yes, Passive SI Active Intent and/or Plan: With Intent; With Plan; With Means to Carry Out SI Passive Intent and/or Plan: Without Intent; Without Plan; Without Means to Carry Out; Without Access to Means  Homicidal Thoughts:Homicidal Thoughts: Yes, Passive HI Passive Intent and/or Plan: Without Intent; Without Plan; Without Means to Carry Out; Without Access to Means  Sensorium  Memory: Immediate Good; Recent Good; Remote Good  Judgment: Impaired  Insight: Present  Executive Functions  Concentration: Fair  Attention Span: Fair  Recall: Good  Fund of Knowledge: Fair  Language: Good  Psychomotor Activity  Psychomotor Activity: Psychomotor Activity: Normal  Assets  Assets: Communication Skills; Desire for Improvement; Financial Resources/Insurance; Housing; Social Support  Sleep  Sleep: Sleep: Poor Number of Hours of Sleep: 5.5  Physical Exam: See H&P Blood pressure 127/79, pulse (!) 120, temperature 98.5 F (36.9 C), temperature source Oral, resp. rate 20, height 5\' 7"  (1.702 m), weight (!) 167.4 kg, SpO2 95%. Body mass index is 57.79 kg/m.  COGNITIVE FEATURES THAT CONTRIBUTE TO RISK:  Closed-mindedness, Loss of executive function, Polarized thinking, and Thought constriction (tunnel vision)    SUICIDE RISK:   Severe:  Frequent, intense, and enduring suicidal ideation, specific plan, no subjective intent, but some objective markers of intent (i.e., choice of lethal method), the method is accessible, some limited preparatory behavior, evidence of impaired self-control, severe  dysphoria/symptomatology, multiple risk factors present, and few if any protective factors, particularly a lack of social support.  PLAN OF CARE: See H&P.  I certify that inpatient services furnished  can reasonably be expected to improve the patient's condition.   Armandina Stammer, NP, pmhnp, fnp-bc. 12/18/2022, 3:02 PM

## 2022-12-18 NOTE — Progress Notes (Signed)
   12/18/22 0556  15 Minute Checks  Location Dayroom  Visual Appearance Calm  Behavior Composed  Sleep (Behavioral Health Patients Only)  Calculate sleep? (Click Yes once per 24 hr at 0600 safety check) Yes  Documented sleep last 24 hours 4.5

## 2022-12-18 NOTE — Plan of Care (Signed)
  Problem: Education: Goal: Emotional status will improve Outcome: Progressing Goal: Mental status will improve Outcome: Progressing   Problem: Activity: Goal: Sleeping patterns will improve Outcome: Progressing   Problem: Safety: Goal: Periods of time without injury will increase Outcome: Progressing   

## 2022-12-18 NOTE — Progress Notes (Signed)
   12/18/22 2045  Psych Admission Type (Psych Patients Only)  Admission Status Voluntary  Psychosocial Assessment  Patient Complaints None  Eye Contact Fair  Facial Expression Flat  Affect Sad  Speech Logical/coherent  Interaction Assertive  Motor Activity Slow  Appearance/Hygiene In hospital gown  Behavior Characteristics Cooperative  Mood Preoccupied;Suspicious  Aggressive Behavior  Effect No apparent injury  Thought Process  Coherency Circumstantial  Content Blaming self;Preoccupation  Delusions Paranoid  Perception Depersonalization;Hallucinations  Hallucination Auditory  Judgment WDL  Confusion None  Danger to Self  Current suicidal ideation? Passive  Agreement Not to Harm Self Yes  Description of Agreement Verbal commitment for safety  Danger to Others  Danger to Others None reported or observed

## 2022-12-18 NOTE — [facility] Group Notes (Signed)
Pt did not attend wrap-up group   
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
## 2022-12-18 NOTE — Progress Notes (Signed)
Pt up complaining of not being able to sleep, pt stated he was getting angry because he could not go to sleep, pt offered agitation protocol and pt given per [facility]

## 2022-12-18 NOTE — BH IP Treatment Plan (Unsigned)
Interdisciplinary Treatment and Diagnostic Plan Initial  12/18/2022 Time of Session: [address] MRN: [id number]  Principal Diagnosis: Paranoid schizophrenia [facility])  Secondary Diagnoses: Principal Problem:   Paranoid schizophrenia (HCC)   Current Medications:  Current Facility-Administered Medications  Medication Dose Route Frequency Provider Last Rate Last Admin   acetaminophen (TYLENOL) tablet 650 mg  650 mg Oral Q6H PRN Ardis Hughs, NP       alum & mag hydroxide-simeth (MAALOX/MYLANTA) 200-200-20 MG/5ML suspension 30 mL  30 mL Oral Q4H PRN Ardis Hughs, NP       diphenhydrAMINE (BENADRYL) capsule 50 mg  50 mg Oral TID PRN Ardis Hughs, NP   50 mg at 12/18/22 1046   Or   diphenhydrAMINE (BENADRYL) injection 50 mg  50 mg Intramuscular TID PRN Ardis Hughs, NP       divalproex (DEPAKOTE ER) 24 hr tablet 500 mg  500 mg Oral QHS Vernard Gambles H, NP   500 mg at 12/17/22 2029   haloperidol (HALDOL) tablet 5 mg  5 mg Oral TID PRN Ardis Hughs, NP   5 mg at 12/18/22 1046   Or   haloperidol lactate (HALDOL) injection 5 mg  5 mg Intramuscular TID PRN Ardis Hughs, NP       hydrOXYzine (ATARAX) tablet 25 mg  25 mg Oral TID PRN Ardis Hughs, NP   25 mg at 12/17/22 2029   LORazepam (ATIVAN) tablet 2 mg  2 mg Oral TID PRN Ardis Hughs, NP   2 mg at 12/18/22 1046   Or   LORazepam (ATIVAN) injection 2 mg  2 mg Intramuscular TID PRN Ardis Hughs, NP       magnesium hydroxide (MILK OF MAGNESIA) suspension 30 mL  30 mL Oral Daily PRN Ardis Hughs, NP       metFORMIN (GLUCOPHAGE) tablet 500 mg  500 mg Oral BID WC Ardis Hughs, NP   500 mg at 12/18/22 6578   multivitamin with minerals tablet 1 tablet  1 tablet Oral Daily Ardis Hughs, NP   1 tablet at 12/18/22 0917   traZODone (DESYREL) tablet 50 mg  50 mg Oral QHS PRN Ardis Hughs, NP   50 mg at 12/17/22 2029   venlafaxine XR (EFFEXOR-XR) 24 hr capsule 37.5 mg   37.5 mg Oral Daily Ardis Hughs, NP   37.5 mg at 12/18/22 4696   vitamin D3 (CHOLECALCIFEROL) tablet 10,000 Units  10,000 Units Oral Daily Ardis Hughs, NP   10,000 Units at 12/18/22 2952   PTA Medications: Medications Prior to Admission  Medication Sig Dispense Refill Last Dose   benztropine (COGENTIN) 0.5 MG tablet Take 0.25 mg by mouth 2 (two) times daily.      Cholecalciferol (VITAMIN D3) 250 MCG (10000 UT) capsule Take 10,000 Units by mouth daily.      diphenhydrAMINE (BENADRYL) 25 MG tablet Take 25 mg by mouth every 6 (six) hours as needed for sleep or allergies.      divalproex (DEPAKOTE ER) 500 MG 24 hr tablet       INVEGA TRINZA 546 MG/1.75ML injection       metFORMIN (GLUCOPHAGE) 500 MG tablet Take 1 tablet (500 mg total) by mouth 2 (two) times daily with a meal. :Medication induced wt management 180 tablet 3    Multiple Vitamins-Minerals (MENS MULTIVITAMIN PO) Take by mouth.      Omega-3 Fatty Acids (FISH OIL) 1000 MG CAPS Take 1,000 mg by mouth  daily.      Semaglutide,0.25 or 0.5MG /DOS, (OZEMPIC, 0.25 OR 0.5 MG/DOSE,) 2 MG/3ML SOPN DIAL AND INJECT UNDER THE SKIN 0.5 MG WEEKLY 3 mL 5    triamcinolone cream (KENALOG) 0.1 % Apply 1 Application topically 2 (two) times daily.      venlafaxine XR (EFFEXOR-XR) 37.5 MG 24 hr capsule Take 37.5 mg by mouth daily.       Patient Stressors: Other: Mental issues    Patient Strengths: Physical Health  Supportive family/friends   Treatment Modalities: Medication Management, Group therapy, Case management,  1 to 1 session with clinician, Psychoeducation, Recreational therapy.   Physician Treatment Plan for Primary Diagnosis: Paranoid schizophrenia (HCC) Long Term Goal(s): Improvement in symptoms so as ready for discharge   Short Term Goals: Ability to identify and develop effective coping behaviors will improve Ability to maintain clinical measurements within normal limits will improve Compliance with prescribed medications  will improve Ability to identify triggers associated with substance abuse/mental health issues will improve Ability to identify changes in lifestyle to reduce recurrence of condition will improve Ability to verbalize feelings will improve Ability to disclose and discuss suicidal ideas Ability to demonstrate self-control will improve  Medication Management: Evaluate patient's response, side effects, and tolerance of medication regimen.  Therapeutic Interventions: 1 to 1 sessions, Unit Group sessions and Medication administration.  Evaluation of Outcomes: Progressing  Physician Treatment Plan for Secondary Diagnosis: Principal Problem:   Paranoid schizophrenia (HCC)  Long Term Goal(s): Improvement in symptoms so as ready for discharge   Short Term Goals: Ability to identify and develop effective coping behaviors will improve Ability to maintain clinical measurements within normal limits will improve Compliance with prescribed medications will improve Ability to identify triggers associated with substance abuse/mental health issues will improve Ability to identify changes in lifestyle to reduce recurrence of condition will improve Ability to verbalize feelings will improve Ability to disclose and discuss suicidal ideas Ability to demonstrate self-control will improve     Medication Management: Evaluate patient's response, side effects, and tolerance of medication regimen.  Therapeutic Interventions: 1 to 1 sessions, Unit Group sessions and Medication administration.  Evaluation of Outcomes: Progressing   RN Treatment Plan for Primary Diagnosis: Paranoid schizophrenia (HCC) Long Term Goal(s): Knowledge of disease and therapeutic regimen to maintain health will improve  Short Term Goals: Ability to remain free from injury will improve, Ability to verbalize frustration and anger appropriately will improve, Ability to demonstrate self-control, Ability to participate in decision making  will improve, Ability to verbalize feelings will improve, Ability to disclose and discuss suicidal ideas, Ability to identify and develop effective coping behaviors will improve, and Compliance with prescribed medications will improve  Medication Management: RN will administer medications as ordered by provider, will assess and evaluate patient's response and provide education to patient for prescribed medication. RN will report any adverse and/or side effects to prescribing provider.  Therapeutic Interventions: 1 on 1 counseling sessions, Psychoeducation, Medication administration, Evaluate responses to treatment, Monitor vital signs and CBGs as ordered, Perform/monitor CIWA, COWS, AIMS and Fall Risk screenings as ordered, Perform wound care treatments as ordered.  Evaluation of Outcomes: Progressing   LCSW Treatment Plan for Primary Diagnosis: Paranoid schizophrenia (HCC) Long Term Goal(s): Safe transition to appropriate next level of care at discharge, Engage patient in therapeutic group addressing interpersonal concerns.  Short Term Goals: Engage patient in aftercare planning with referrals and resources, Increase social support, Increase ability to appropriately verbalize feelings, Increase emotional regulation, Facilitate acceptance of mental health diagnosis  and concerns, Facilitate patient progression through stages of change regarding substance use diagnoses and concerns, Identify triggers associated with mental health/substance abuse issues, and Increase skills for wellness and recovery  Therapeutic Interventions: Assess for all discharge needs, 1 to 1 time with Social worker, Explore available resources and support systems, Assess for adequacy in community support network, Educate family and significant other(s) on suicide prevention, Complete Psychosocial Assessment, Interpersonal group therapy.  Evaluation of Outcomes: Progressing   Progress in Treatment: Attending groups:  No. Participating in groups: Yes. Taking medication as prescribed: Yes. Toleration medication: Yes. Family/Significant other contact made: Yes, individual(s) contacted:  (Mom) [name] Howden [phone] Patient understands diagnosis: Yes. Discussing patient identified problems/goals with staff: Yes. Medical problems stabilized or resolved: No. Denies suicidal/homicidal ideation: No. Issues/concerns per patient self-inventory: No. Other: N/A  New problem(s) identified: No, Describe:  None reported  New Short Term/Long Term Goal(s):  Patient Goals:  Medication Management  Discharge Plan or Barriers: Patient recently admitted. CSW will continue to follow and assess for appropriate referrals and possible discharge planning.   Reason for Continuation of Hospitalization: Delusions  Depression Hallucinations Homicidal ideation Medication stabilization Suicidal ideation  Estimated Length of Stay: 5-7 Days  Last 3 [place] Suicide Severity Risk Score: Flowsheet Row [facility] (Current[facility] Most recent reading at 12/17/2022  9:51 PM ED from 12/17/2022 in [facility] Most recent reading at 12/17/2022  9:12 AM ED from 11/04/2022 in [facility] Emergency Department [facility] Most recent reading at 11/04/2022  8:26 PM  C-SSRS RISK CATEGORY No Risk Moderate Risk No Risk       Last PHQ 2/9 Scores:    07/24/2022    2:09 PM 06/12/2022    1:57 PM 02/15/2022    1:34 PM  Depression screen PHQ 2/9  Decreased Interest 0 0 0  Down, Depressed, Hopeless 0 0 0  PHQ - 2 Score 0 0 0   medication stabilization, elimination of SI thoughts, development of comprehensive mental wellness plan.    Scribe for Treatment Team: Ane Payment, LCSW 12/18/2022 2:22 PM

## 2022-12-18 NOTE — Group Note (Signed)
Recreation Therapy Group Note   Group Topic:Other  Group Date: 12/18/2022 Start Time: 1015 End Time: 1045 Facilitators: Kavita Bartl-McCall, LRT,CTRS Location: [address] Dayroom  Goal Area(s) Addresses:  Patient will identify the benefits of music.  Patient will identify some of the emotions that music can bring.  Group Description: Music Therapy. LRT and patients discussed the importance of music and how it can affect emotions and mood. LRT allowed patients to pick the songs of their choosing to play in group. Patients had to choose songs that were clean/appropriate and it's affects.   Affect/Mood: N/A   Participation Level: Did not attend    Clinical Observations/Individualized Feedback:      Plan: Continue to engage patient in RT group sessions 2-3x/week.   Vernee Baines-McCall, LRT,CTRS 12/18/2022 12:38 PM

## 2022-12-19 DIAGNOSIS — F203 Undifferentiated schizophrenia: Secondary | ICD-10-CM | POA: Diagnosis not present

## 2022-12-19 MED ORDER — TRAZODONE HCL 50 MG PO TABS
50.0000 mg | ORAL_TABLET | ORAL | Status: AC
  Filled 2022-12-19 (×2): qty 1

## 2022-12-19 NOTE — Progress Notes (Signed)
Medications have not been given yet because patient is sleeping hard, snoring. He arouses and speaks when nurse is talking to him but falls right back asleep.

## 2022-12-19 NOTE — Plan of Care (Signed)

## 2022-12-19 NOTE — Progress Notes (Signed)
Patient calm and cooperative this shift. Has had multiple somatic complaints but has been resting in his room for the last few hours. Denies SI/HI/AVH. Does not appear to be responding to internal stimuli at this time.

## 2022-12-19 NOTE — Group Note (Signed)
Occupational Therapy Group Note  Group Topic:Coping Skills  Group Date: 12/19/2022 Start Time: 1430 End Time: 1500 Facilitators: Ted Mcalpine, OT   Group Description: Group encouraged increased engagement and participation through discussion and activity focused on "Coping Ahead." Patients were split up into teams and selected a card from a stack of positive coping strategies. Patients were instructed to act out/charade the coping skill for other peers to guess and receive points for their team. Discussion followed with a focus on identifying additional positive coping strategies and patients shared how they were going to cope ahead over the weekend while continuing hospitalization stay.  Therapeutic Goal(s): Identify positive vs negative coping strategies. Identify coping skills to be used during hospitalization vs coping skills outside of hospital/at home Increase participation in therapeutic group environment and promote engagement in treatment   Participation Level: Did not attend                              Plan: Continue to engage patient in OT groups 2 - 3x/week.  12/19/2022  Ted Mcalpine, OT  Kerrin Champagne, OT

## 2022-12-19 NOTE — Plan of Care (Signed)
  Problem: Education: Goal: Emotional status will improve Outcome: Progressing Goal: Mental status will improve Outcome: Progressing   

## 2022-12-19 NOTE — Progress Notes (Signed)
Adult Psychoeducational Group Note  Date:  12/19/2022 Time:  8:29 PM  Group Topic/Focus:  Wrap-Up Group:   The focus of this group is to help patients review their daily goal of treatment and discuss progress on daily workbooks.  Participation Level:  Active  Participation Quality:  Appropriate  Affect:  Appropriate  Cognitive:  Appropriate  Insight: Appropriate  Engagement in Group:  Engaged  Modes of Intervention:  Discussion and Support  Additional Comments:  Pt attended and participated in group discussion. Pt states wanting to work on being out of his room more and attending more groups.  Shyteria Lewis Katrinka Blazing 12/19/2022, 8:29 PM

## 2022-12-19 NOTE — Progress Notes (Signed)
Patient voiced concerns related to his mothers concerns about Metformin, he wants to explore a different medication. Informed pt to talk to his doctor about it tomorrow when they round. Pt verbalized understanding.

## 2022-12-19 NOTE — Progress Notes (Signed)
   12/19/22 2145  Psych Admission Type (Psych Patients Only)  Admission Status Voluntary  Psychosocial Assessment  Patient Complaints None  Eye Contact Fair  Facial Expression Flat  Affect Sad  Speech Logical/coherent  Interaction Assertive  Motor Activity Slow  Appearance/Hygiene In hospital gown  Behavior Characteristics Cooperative  Mood Suspicious;Preoccupied  Aggressive Behavior  Effect No apparent injury  Thought Process  Coherency Circumstantial  Content Blaming self;Preoccupation  Delusions Paranoid  Perception Depersonalization;Hallucinations  Hallucination Auditory  Judgment WDL  Confusion None  Danger to Self  Current suicidal ideation? Passive  Agreement Not to Harm Self Yes  Description of Agreement Verbal commitment for safety  Danger to Others  Danger to Others None reported or observed

## 2022-12-19 NOTE — Progress Notes (Signed)
Patient has come to the window several times this shift speaking with Clinical research associate about several different issues. He reports he feels like he is having some cognitive impairment and he can't remember what day of the week it is, the date, etc. When writer asks him day of week, date time etc, he gets the information correct.   He also reports he did not sleep well enough and he is requesting Seroquel for better sleep in addition to his other medications later tonight.   He asked this Clinical research associate for sleeping medication for the  daytime as well but Clinical research associate educated him on the need to attend groups, go to meals and activities, etc. Pt agreed and is calm/cooperative.

## 2022-12-19 NOTE — Progress Notes (Signed)
[facility] MD Progress Note  12/19/2022 5:55 PM Dillen Grayer  MRN:  [id number]  Reason for admission: 27 year old obese AA male with hx of schizophrenia. Patient is known to this [facility] from his previous admission/treatments for similar complaints. He is also known in the other psychiatric hospitals with the surrounding areas for treatment of psychosis. Patient per his report, he is currently being followed by [facility] on an outpatient basis. He is being admitted this time around from the [facility] with complaint of worsening psychosis. After evaluation at [facility], he was transferred to the [facility] for further psychiatric evaluation/treatments. A review of his toxicology reports has shown negative of all substances   Daily notes: Brandun is seen today in his room. He presents alert, oriented & aware of situation. He appears fairly groomed with an improved affect/eye contact. He is verbally responsive. He reports, "My mood is peaceful today. The medication (Haldol) is doing what it needs to do for me. The voices are still there, but faint. Once in a while, I will make out what they were saying. I feel some depression in me because of the lies the voices are putting in my head. I also think that what is going on inside of me is pseudo-hallucinations. This is because what I'm hearing & what they are putting in my head is not what I'm intending to do. To you (addressing this provider), I'm so sorry for saying the things I said to you that the voices were telling me to do you. I feel bad about it because that was inappropriate". Patient presents today more civil than yesterday. He seems to be re-emerging into reality. He was very vulgar yesterday in describing what the voices were saying to him. He is apologizing to this provider today about the things he said yesterday. Patient is re-assured that no one is mad at him & not to dwell on what ever that was said yesterday. And with him being civil & a bit clear minded today, patient  was reminded about the need to transition by discharge to an injectable form of Haldol since his psychosis seems to be responding well to haldol. Patient is in agreement. Patient did endorse passive suicidal ideations & mild depression because of the things the voices are instilling inside his head & mind. He currently denies any plans or intent to hurt himself or others. Patient is taking & tolerating his treatment regimen. Denies any side effects. He is visible on the unit, attends group sessions.  Soon after this follow-up evaluation, the RN caring for this patient reports via secure chart that patient is saying he didn't sleep well enough and requesting seroquel in addition to his other night time meds. He is also wanting something for sleep now so he could sleep all day. He is also complaining of having some cognitive impairment and he can't remember what day of the week it is, the date, etc. But when I asked of the day of week, date time etc, he gets the information correct. Patient's requests for an addition of Seroquel to his regimen to help him sleep better at night or during the day were declined as there is no clinical justification to adding another antipsychotic regimen to his plan of care.  Reviewed current plan of care, no changes made. Reviewed vital signs, stable. There are no new lab results. Continue current plan of care as already in progress.  Principal Problem: Undifferentiated schizophrenia (HCC)  Diagnosis: Principal Problem:   Undifferentiated schizophrenia (HCC)  Active Problems:   Paranoid schizophrenia (HCC)  Total Time spent with patient: 45 minutes  Past Psychiatric History:  Patient reports he was first admitted in a psychiatric hospital in [place], [place]. Says has been admitted in a psychiatric hospitals been admitted to the psychiatric hospital for at least 10-16 times in his life time. His most recent hospitalization was approximately 2 years ago here at [facility]. Says has  been admitted to the [facility] in [place], [place]. With similar complaints. He was being followed by Dr. Vance Peper now is with [facility].   Past Medical History:  Past Medical History:  Diagnosis Date   Complication of anesthesia    Elevated CPK    per patient   Schizophrenia [facility])    History reviewed. No pertinent surgical history.  Family History:  Family History  Problem Relation Age of Onset   Mental illness Brother    Family Psychiatric  History: Patient reports, has a brother with mental illness, drug addiction & homeless. Says mental illnesses run his family.    Social History:  Social History   Substance and Sexual Activity  Alcohol Use Never     Social History   Substance and Sexual Activity  Drug Use Never    Social History   Socioeconomic History   Marital status: Single    Spouse name: Not on file   Number of children: Not on file   Years of education: Not on file   Highest education level: Associate degree: occupational, Scientist, product/process development, or vocational program  Occupational History   Not on file  Tobacco Use   Smoking status: Former    Current packs/day: 0.00    Types: Cigarettes    Quit date: 2023    Years since quitting: 1.6   Smokeless tobacco: Never  Vaping Use   Vaping status: Not on file  Substance and Sexual Activity   Alcohol use: Never   Drug use: Never   Sexual activity: Not on file  Other Topics Concern   Not on file  Social History Narrative   Not on file   Social Determinants of Health   Financial Resource Strain: Low Risk  (07/23/2022)   Overall Financial Resource Strain (CARDIA)    Difficulty of Paying Living Expenses: Not hard at all  Food Insecurity: No Food Insecurity (12/17/2022)   Hunger Vital Sign    Worried About Running Out of Food in the Last Year: Never true    Ran Out of Food in the Last Year: Never true  Transportation Needs: No Transportation Needs (12/17/2022)   PRAPARE - Administrator, Civil Service  (Medical): No    Lack of Transportation (Non-Medical): No  Physical Activity: Sufficiently Active (07/23/2022)   Exercise Vital Sign    Days of Exercise per Week: 4 days    Minutes of Exercise per Session: 90 min  Stress: Stress Concern Present (07/23/2022)   [employer] of Occupational Health - Occupational Stress Questionnaire    Feeling of Stress : Rather much  Social Connections: Unknown (12/02/2022)   Received from [facility]   Social Network    Social Network: Not on file   Additional Social History:   Sleep: Good  Appetite:  Good  Current Medications: Current Facility-Administered Medications  Medication Dose Route Frequency Provider Last Rate Last Admin   acetaminophen (TYLENOL) tablet 650 mg  650 mg Oral Q6H PRN Ardis Hughs, NP       alum & mag hydroxide-simeth (MAALOX/MYLANTA) 200-200-20 MG/5ML suspension  30 mL  30 mL Oral Q4H PRN Ardis Hughs, NP       benztropine (COGENTIN) tablet 0.5 mg  0.5 mg Oral BID Massengill, Harrold Donath, MD   0.5 mg at 12/19/22 1640   diphenhydrAMINE (BENADRYL) capsule 50 mg  50 mg Oral TID PRN Ardis Hughs, NP   50 mg at 12/18/22 1046   Or   diphenhydrAMINE (BENADRYL) injection 50 mg  50 mg Intramuscular TID PRN Ardis Hughs, NP       divalproex (DEPAKOTE ER) 24 hr tablet 1,000 mg  1,000 mg Oral QHS Massengill, Harrold Donath, MD   1,000 mg at 12/18/22 2216   haloperidol (HALDOL) tablet 10 mg  10 mg Oral Q12H Massengill, Harrold Donath, MD   10 mg at 12/19/22 1016   haloperidol (HALDOL) tablet 5 mg  5 mg Oral TID PRN Ardis Hughs, NP   5 mg at 12/18/22 1046   Or   haloperidol lactate (HALDOL) injection 5 mg  5 mg Intramuscular TID PRN Ardis Hughs, NP       hydrOXYzine (ATARAX) tablet 25 mg  25 mg Oral TID PRN Ardis Hughs, NP   25 mg at 12/19/22 1312   LORazepam (ATIVAN) tablet 2 mg  2 mg Oral TID PRN Ardis Hughs, NP   2 mg at 12/18/22 1046   Or   LORazepam (ATIVAN) injection 2 mg  2 mg Intramuscular TID PRN  Ardis Hughs, NP       magnesium hydroxide (MILK OF MAGNESIA) suspension 30 mL  30 mL Oral Daily PRN Ardis Hughs, NP       metFORMIN (GLUCOPHAGE) tablet 500 mg  500 mg Oral BID WC Ardis Hughs, NP   500 mg at 12/19/22 1640   multivitamin with minerals tablet 1 tablet  1 tablet Oral Daily Ardis Hughs, NP   1 tablet at 12/19/22 1016   propranolol (INDERAL) tablet 10 mg  10 mg Oral Q8H Massengill, Nathan, MD   10 mg at 12/19/22 1311   traZODone (DESYREL) tablet 50 mg  50 mg Oral QHS PRN Ardis Hughs, NP   50 mg at 12/18/22 2216   vitamin D3 (CHOLECALCIFEROL) tablet 1,000 Units  1,000 Units Oral Daily Massengill, Harrold Donath, MD   1,000 Units at 12/19/22 1016   Lab Results: No results found for this or any previous visit (from the past 48 hour(s)).  Blood Alcohol level:  Lab Results  Component Value Date   ETH <10 12/17/2022   ETH <10 08/28/2021   Metabolic Disorder Labs: Lab Results  Component Value Date   HGBA1C 6.1 (H) 12/17/2022   MPG 128.37 12/17/2022   MPG 111.15 12/04/2020   No results found for: "PROLACTIN" Lab Results  Component Value Date   CHOL 154 12/17/2022   TRIG 70 12/17/2022   HDL 40 (L) 12/17/2022   CHOLHDL 3.9 12/17/2022   VLDL 14 12/17/2022   LDLCALC 100 (H) 12/17/2022   LDLCALC 93 11/06/2021   Physical Findings: AIMS:  , ,  ,  ,    CIWA:    COWS:     Musculoskeletal: Strength & Muscle Tone: within normal limits Gait & Station: normal Patient leans: N/A  Psychiatric Specialty Exam:  Presentation  General Appearance:  Casual; Fairly Groomed  Eye Contact: -- (Stares)  Speech: Clear and Coherent; Normal Rate  Speech Volume: Normal  Handedness: Right  Mood and Affect  Mood: -- (patient is showing slight improvement)  Affect: Congruent  Thought  Process  Thought Processes: Coherent; Goal Directed  Descriptions of Associations:Intact  Orientation:Full (Time, Place and Person)  Thought  Content:Logical  History of Schizophrenia/Schizoaffective disorder:Yes  Duration of Psychotic Symptoms:Greater than six months  Hallucinations:Hallucinations: Auditory Description of Auditory Hallucinations: "The voices are still there, but faint. once in a while I will make out what they were saying".  Ideas of Reference:None  Suicidal Thoughts:Suicidal Thoughts: Yes, Passive SI Passive Intent and/or Plan: Without Intent; Without Plan; Without Means to Carry Out; Without Access to Means  Homicidal Thoughts:Homicidal Thoughts: No HI Passive Intent and/or Plan: Without Intent; Without Means to Carry Out; Without Plan; Without Access to Means   Sensorium  Memory: Immediate Good; Recent Good; Remote Good  Judgment: Fair  Insight: Fair   Art therapist  Concentration: Fair  Attention Span: Fair  Recall: Dudley Major of Knowledge: Fair  Language: Good  Psychomotor Activity  Psychomotor Activity: Psychomotor Activity: Normal  Assets  Assets: Communication Skills; Desire for Improvement; Housing; Physical Health; Resilience; Social Support  Sleep  Sleep: Sleep: Good Number of Hours of Sleep: 7.5  Physical Exam: Physical Exam Vitals and nursing note reviewed.  HENT:     Mouth/Throat:     Pharynx: Oropharynx is clear.  Cardiovascular:     Rate and Rhythm: Normal rate.     Pulses: Normal pulses.  Pulmonary:     Effort: Pulmonary effort is normal.  Genitourinary:    Comments: Deferred Musculoskeletal:        General: Normal range of motion.  Skin:    General: Skin is warm and dry.  Neurological:     General: No focal deficit present.     Mental Status: He is alert and oriented to person, place, and time.    Review of Systems  Constitutional:  Negative for chills, diaphoresis and fever.  HENT:  Negative for congestion and sore throat.   Respiratory:  Negative for cough, shortness of breath and wheezing.   Cardiovascular:  Negative for chest  pain and palpitations.  Gastrointestinal:  Negative for abdominal pain, constipation, diarrhea, heartburn, nausea and vomiting.  Musculoskeletal:  Negative for joint pain and myalgias.  Neurological:  Negative for dizziness, tingling, tremors, sensory change, speech change, focal weakness, seizures, loss of consciousness, weakness and headaches.  Endo/Heme/Allergies:        Allergies: NKDA  Psychiatric/Behavioral:  Positive for depression, hallucinations (Says the hallucinations are faint today.) and suicidal ideas (Passive ideation, denies any plans or intent.). Negative for memory loss and substance abuse. The patient is not nervous/anxious and does not have insomnia.    Blood pressure 125/86, pulse 94, temperature 98.4 F (36.9 C), temperature source Oral, resp. rate 14, height 5\' 7"  (1.702 m), weight (!) 167.4 kg, SpO2 95%. Body mass index is 57.79 kg/m.  Treatment Plan Summary: Daily contact with patient to assess and evaluate symptoms and progress in treatment and Medication management.   Continue inpatient hospitalization.  Will continue today 12/19/2022 plan as below except where it is noted.   Principal/active diagnoses. 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 The patient consents to medication trial.    -Continue Haldol 10 mg po bid for psychosis.  -Continue Depakote ER from 500 mg to 100 mg po Q hs for mood stabilization. -Continue Cogentin 0.5 mg po bid for prevention of eps.  -Continue Hydroxyzine 25 mg po tid prn for anxiety.  -  Continue Trazodone 50 mg po Q hs prn for insomnia.  -Continue Propranolol 10 mg po bid for elevated pulse rate.  -Continue Vit D3 1000 units po for vit D 3 replacement.    Agitation protocols: Cont as recommended;  -Benadryl 50 mg po or IM tid prn. -Haldol 5  mg po or IM tid prn.  -Lorazepam 2 mg po or IM tid prn.   Other medical concerns being addressed.  -Continue metformin 500 mg po bid for DM management.    Other PRNS -Continue Tylenol 650 mg every 6 hours PRN for mild pain -Continue Maalox 30 ml Q 4 hrs PRN for indigestion -Continue MOM 30 ml po Q 6 hrs for constipation   Safety and Monitoring: Voluntary admission to inpatient psychiatric unit for safety, stabilization and treatment Daily contact with patient to assess and evaluate symptoms and progress in treatment Patient's case to be discussed in multi-disciplinary team meeting Observation Level : q15 minute checks Vital signs: q12 hours Precautions: Safety   Discharge Planning: Social work and case management to assist with discharge planning and identification of hospital follow-up needs prior to discharge Estimated LOS: 5-7 days Discharge Concerns: Need to establish a safety plan; Medication compliance and effectiveness Discharge Goals: Return home with outpatient referrals for mental health follow-up including medication management/psychotherapy  Armandina Stammer, NP, pmhnp, fnp-bc. 12/19/2022, 5:55 PM

## 2022-12-19 NOTE — Progress Notes (Signed)
Pt up to the nursing station numerous times stating he was not sleepy, NP notified of pt issue not being able to sleep

## 2022-12-19 NOTE — Group Note (Signed)
Recreation Therapy Group Note   Group Topic:Communication  Group Date: 12/19/2022 Start Time: 0955 End Time: 1037 Facilitators: Rowen Wilmer-McCall, LRT,CTRS Location: [address] Dayroom   Goal Area(s) Addresses:  Patient will effectively listen to complete activity.  Patient will identify communication skills used to make activity successful.  Patient will identify how skills used during activity can be used to reach post d/c goals.    Group Description: Geometric Drawings.  Three volunteers from the peer group will be shown an abstract picture with a particular arrangement of geometrical shapes.  Each round, one 'speaker' will describe the pattern, as accurately as possible without revealing the image to the group.  The remaining group members will listen and draw the picture to reflect how it is described to them. Patients with the role of 'listener' cannot ask clarifying questions but, may request that the speaker repeat a direction. Once the drawings are complete, the presenter will show the rest of the group the picture and compare how close each person came to drawing the picture. LRT will facilitate a post-activity discussion regarding effective communication and the importance of planning, listening, and asking for clarification in daily interactions with others.   Affect/Mood: N/A   Participation Level: Did not attend    Clinical Observations/Individualized Feedback:     Plan: Continue to engage patient in RT group sessions 2-3x/week.   Davarius Ridener-McCall, LRT,CTRS  12/19/2022 11:14 AM

## 2022-12-20 DIAGNOSIS — F203 Undifferentiated schizophrenia: Secondary | ICD-10-CM | POA: Diagnosis not present

## 2022-12-20 NOTE — Progress Notes (Signed)
   12/20/22 1100  Psych Admission Type (Psych Patients Only)  Admission Status Voluntary  Psychosocial Assessment  Patient Complaints None  Eye Contact Fair  Facial Expression Flat  Affect Depressed  Speech Logical/coherent  Interaction Minimal  Motor Activity Slow  Appearance/Hygiene Unremarkable  Behavior Characteristics Cooperative  Mood Preoccupied  Thought Process  Coherency Circumstantial  Content Preoccupation  Delusions Paranoid  Perception Hallucinations  Hallucination Auditory  Judgment WDL  Confusion None  Danger to Self  Current suicidal ideation? Denies  Danger to Others  Danger to Others None reported or observed

## 2022-12-20 NOTE — Progress Notes (Signed)
   12/20/22 2115  Psych Admission Type (Psych Patients Only)  Admission Status Voluntary  Psychosocial Assessment  Patient Complaints Worrying  Eye Contact Fair  Facial Expression Flat  Affect Sad  Speech Logical/coherent  Interaction Assertive  Motor Activity Slow  Appearance/Hygiene In hospital gown  Behavior Characteristics Cooperative  Mood Suspicious;Preoccupied  Aggressive Behavior  Effect No apparent injury  Thought Process  Coherency Circumstantial  Content Blaming self;Preoccupation  Delusions Paranoid  Perception Depersonalization;Hallucinations  Hallucination Auditory  Judgment WDL  Confusion None  Danger to Self  Current suicidal ideation? Passive  Agreement Not to Harm Self Yes  Description of Agreement Verbal commitment for safety  Danger to Others  Danger to Others None reported or observed

## 2022-12-20 NOTE — Progress Notes (Signed)
   12/20/22 0545  15 Minute Checks  Location Bedroom  Visual Appearance Calm  Behavior Sleeping  Sleep (Behavioral Health Patients Only)  Calculate sleep? (Click Yes once per 24 hr at 0600 safety check) Yes  Documented sleep last 24 hours 6.25
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
## 2022-12-20 NOTE — Plan of Care (Signed)
  Problem: Education: Goal: Knowledge of [place] [facility] Education information/materials will improve Outcome: Progressing Goal: Emotional status will improve Outcome: Progressing Goal: Mental status will improve Outcome: Progressing   Problem: Activity: Goal: Interest or engagement in activities will improve Outcome: Progressing Goal: Sleeping patterns will improve Outcome: Progressing   Problem: Physical Regulation: Goal: Ability to maintain clinical measurements within normal limits will improve Outcome: Progressing

## 2022-12-20 NOTE — Group Note (Signed)
Recreation Therapy Group Note   Group Topic:Team Building  Group Date: 12/20/2022 Start Time: 1000 End Time: 1026 Facilitators: Brayln Duque-McCall, LRT,CTRS Location: [address] Dayroom   Goal Area(s) Addresses:  Patient will effectively work with peer towards shared goal.  Patient will identify skills used to make activity successful.  Patient will identify how skills used during activity can be applied to reach post d/c goals.   Group Description: [employer]. In teams of 5-6, patients were given 11 craft pipe cleaners. Using the materials provided, patients were instructed to compete again the opposing team(s) to build the tallest free-standing structure from floor level. The activity was timed; difficulty increased by Clinical research associate as Production designer, theatre/television/film continued.  Systematically resources were removed with additional directions for example, placing one arm behind their back, working in silence, and shape stipulations. LRT facilitated post-activity discussion reviewing team processes and necessary communication skills involved in completion. Patients were encouraged to reflect how the skills utilized, or not utilized, in this activity can be incorporated to positively impact support systems post discharge.   Affect/Mood: Appropriate   Participation Level: Engaged   Participation Quality: Independent   Behavior: Appropriate   Speech/Thought Process: Focused   Insight: Fair   Judgement: Fair    Modes of Intervention: STEM Activity   Patient Response to Interventions:  Engaged   Education Outcome:  In group clarification offered    Clinical Observations/Individualized Feedback: Pt was quiet but cooperative. Pt ended up working by himself because peer was unable to assist him. Pt got some help from LRT and was able to complete the bulk of the activity.      Plan: Continue to engage patient in RT group sessions 2-3x/week.   Nyiah Pianka-McCall, LRT,CTRS   12/20/2022 11:28 AM

## 2022-12-20 NOTE — B[facility] Group Notes (Signed)
Spirituality group facilitated by Kathleen Argue, BCC.  Group Description: Group focused on topic of hope. Patients participated in facilitated discussion around topic, connecting with one another around experiences and definitions for hope. Group members engaged with visual explorer photos, reflecting on what hope looks like for them today. Group engaged in discussion around how their definitions of hope are present today in hospital.  Modalities: Psycho-social ed, Adlerian, Narrative, MI  Patient Progress: Noah Ramsey attended group and actively engaged and participated in group conversation and activities.  Comments demonstrated good insight and he was supportive of peers.

## 2022-12-20 NOTE — Progress Notes (Signed)
NP ordered 1 x 50 mg Trazodone, but pt fell back asleep before it could be given

## 2022-12-20 NOTE — Progress Notes (Signed)
[facility] MD Progress Note  12/20/2022 4:30 PM Noah Ramsey  MRN:  [id number]  Reason for admission: 27 year old obese AA male with hx of schizophrenia. Patient is known to this [facility] from his previous admission/treatments for similar complaints. He is also known in the other psychiatric hospitals with the surrounding areas for treatment of psychosis. Patient per his report, he is currently being followed by [facility] on an outpatient basis. He is being admitted this time around from the [facility] with complaint of worsening psychosis. After evaluation at [facility], he was transferred to the [facility] for further psychiatric evaluation/treatments. A review of his toxicology reports has shown negative of all substances   Daily notes: Noah Ramsey is seen today in his room. Chart reviewed. The chart findings discussed with the treatment team. He presents alert, oriented & aware of situation. He appears fairly groomed with an improved affect/eye contact. He is verbally responsive. He reports, "I'm feeling much better. I feel more alert & in control of my impulsive thoughts. My mood feels normal, less excited today. The voices are all gone away since last night. I have not heard any voices since last night or this morning. I feel like I see people as human beings without the thoughts of making any derogatory comments on them. Then, I also feel numb & I feel sort of suicidal, not like I'm gonna hurt myself or nothing like that. I'm taking my medicines. I have no bad reaction or side effect. Can you give me another antipsychotic to go with my haldol?" This provider declined Alver's request for addition of another antipsychotic medications. He is explained that there will not be any clinical justification to keep him on two separate antipsychotic. He is informed/encouraged that any addition of another antipsychotic medication to his current regimen will only serve him with side effects. Noah Ramsey currently denies any HI, AVH, delusional  thoughts or paranoia. He does not appear to be responding to any internal stimuli. He is however, endorsing passive SI without any plans or intent to hurt himself. Reviewed vital signs, stable. Depakote level due to be drawn Sunday morning.    Principal Problem: Undifferentiated schizophrenia (HCC)  Diagnosis: Principal Problem:   Undifferentiated schizophrenia (HCC) Active Problems:   Paranoid schizophrenia (HCC)  Total Time spent with patient: 45 minutes  Past Psychiatric History:  Patient reports he was first admitted in a psychiatric hospital in [place], [place]. Says has been admitted in a psychiatric hospitals been admitted to the psychiatric hospital for at least 10-16 times in his life time. His most recent hospitalization was approximately 2 years ago here at [facility]. Says has been admitted to the [facility] in [place], [place]. With similar complaints. He was being followed by Dr. Vance Peper now is with [facility].   Past Medical History:  Past Medical History:  Diagnosis Date   Complication of anesthesia    Elevated CPK    per patient   Schizophrenia [facility])    History reviewed. No pertinent surgical history.  Family History:  Family History  Problem Relation Age of Onset   Mental illness Brother    Family Psychiatric  History: Patient reports, has a brother with mental illness, drug addiction & homeless. Says mental illnesses run his family.    Social History:  Social History   Substance and Sexual Activity  Alcohol Use Never     Social History   Substance and Sexual Activity  Drug Use Never    Social History   Socioeconomic History  Marital status: Single    Spouse name: Not on file   Number of children: Not on file   Years of education: Not on file   Highest education level: Associate degree: occupational, Scientist, product/process development, or vocational program  Occupational History   Not on file  Tobacco Use   Smoking status: Former    Current packs/day: 0.00    Types:  Cigarettes    Quit date: 2023    Years since quitting: 1.6   Smokeless tobacco: Never  Vaping Use   Vaping status: Not on file  Substance and Sexual Activity   Alcohol use: Never   Drug use: Never   Sexual activity: Not on file  Other Topics Concern   Not on file  Social History Narrative   Not on file   Social Determinants of Health   Financial Resource Strain: Low Risk  (07/23/2022)   Overall Financial Resource Strain (CARDIA)    Difficulty of Paying Living Expenses: Not hard at all  Food Insecurity: No Food Insecurity (12/17/2022)   Hunger Vital Sign    Worried About Running Out of Food in the Last Year: Never true    Ran Out of Food in the Last Year: Never true  Transportation Needs: No Transportation Needs (12/17/2022)   PRAPARE - Administrator, Civil Service (Medical): No    Lack of Transportation (Non-Medical): No  Physical Activity: Sufficiently Active (07/23/2022)   Exercise Vital Sign    Days of Exercise per Week: 4 days    Minutes of Exercise per Session: 90 min  Stress: Stress Concern Present (07/23/2022)   [employer] of Occupational Health - Occupational Stress Questionnaire    Feeling of Stress : Rather much  Social Connections: Unknown (12/02/2022)   Received from [facility]   Social Network    Social Network: Not on file   Additional Social History:   Sleep: Good  Appetite:  Good  Current Medications: Current Facility-Administered Medications  Medication Dose Route Frequency Provider Last Rate Last Admin   acetaminophen (TYLENOL) tablet 650 mg  650 mg Oral Q6H PRN Ardis Hughs, NP       alum & mag hydroxide-simeth (MAALOX/MYLANTA) 200-200-20 MG/5ML suspension 30 mL  30 mL Oral Q4H PRN Ardis Hughs, NP       benztropine (COGENTIN) tablet 0.5 mg  0.5 mg Oral BID Massengill, Nathan, MD   0.5 mg at 12/20/22 2130   diphenhydrAMINE (BENADRYL) capsule 50 mg  50 mg Oral TID PRN Ardis Hughs, NP   50 mg at 12/18/22 1046   Or    diphenhydrAMINE (BENADRYL) injection 50 mg  50 mg Intramuscular TID PRN Ardis Hughs, NP       divalproex (DEPAKOTE ER) 24 hr tablet 1,000 mg  1,000 mg Oral QHS Massengill, Nathan, MD   1,000 mg at 12/19/22 2040   haloperidol (HALDOL) tablet 10 mg  10 mg Oral Q12H Massengill, Harrold Donath, MD   10 mg at 12/20/22 8657   haloperidol (HALDOL) tablet 5 mg  5 mg Oral TID PRN Ardis Hughs, NP   5 mg at 12/18/22 1046   Or   haloperidol lactate (HALDOL) injection 5 mg  5 mg Intramuscular TID PRN Ardis Hughs, NP       hydrOXYzine (ATARAX) tablet 25 mg  25 mg Oral TID PRN Ardis Hughs, NP   25 mg at 12/19/22 2040   LORazepam (ATIVAN) tablet 2 mg  2 mg Oral TID PRN Ardis Hughs,  NP   2 mg at 12/18/22 1046   Or   LORazepam (ATIVAN) injection 2 mg  2 mg Intramuscular TID PRN Ardis Hughs, NP       magnesium hydroxide (MILK OF MAGNESIA) suspension 30 mL  30 mL Oral Daily PRN Ardis Hughs, NP       metFORMIN (GLUCOPHAGE) tablet 500 mg  500 mg Oral BID WC Ardis Hughs, NP   500 mg at 12/20/22 4132   multivitamin with minerals tablet 1 tablet  1 tablet Oral Daily Ardis Hughs, NP   1 tablet at 12/20/22 4401   propranolol (INDERAL) tablet 10 mg  10 mg Oral Q8H Massengill, Harrold Donath, MD   10 mg at 12/20/22 1451   traZODone (DESYREL) tablet 50 mg  50 mg Oral QHS PRN Ardis Hughs, NP   50 mg at 12/19/22 2040   traZODone (DESYREL) tablet 50 mg  50 mg Oral NOW Sindy Guadeloupe, NP       vitamin D3 (CHOLECALCIFEROL) tablet 1,000 Units  1,000 Units Oral Daily Massengill, Harrold Donath, MD   1,000 Units at 12/20/22 0272   Lab Results: No results found for this or any previous visit (from the past 48 hour(s)).  Blood Alcohol level:  Lab Results  Component Value Date   ETH <10 12/17/2022   ETH <10 08/28/2021   Metabolic Disorder Labs: Lab Results  Component Value Date   HGBA1C 6.1 (H) 12/17/2022   MPG 128.37 12/17/2022   MPG 111.15 12/04/2020   No results found for:  "PROLACTIN" Lab Results  Component Value Date   CHOL 154 12/17/2022   TRIG 70 12/17/2022   HDL 40 (L) 12/17/2022   CHOLHDL 3.9 12/17/2022   VLDL 14 12/17/2022   LDLCALC 100 (H) 12/17/2022   LDLCALC 93 11/06/2021   Physical Findings: AIMS:  , ,  ,  ,    CIWA:    COWS:     Musculoskeletal: Strength & Muscle Tone: within normal limits Gait & Station: normal Patient leans: N/A  Psychiatric Specialty Exam:  Presentation  General Appearance:  Casual; Fairly Groomed  Eye Contact: -- (Stares)  Speech: Clear and Coherent; Normal Rate  Speech Volume: Normal  Handedness: Right  Mood and Affect  Mood: -- (patient is showing slight improvement)  Affect: Congruent  Thought Process  Thought Processes: Coherent; Goal Directed  Descriptions of Associations:Intact  Orientation:Full (Time, Place and Person)  Thought Content:Logical  History of Schizophrenia/Schizoaffective disorder:Yes  Duration of Psychotic Symptoms:Greater than six months  Hallucinations:Hallucinations: Auditory Description of Auditory Hallucinations: "The voices are still there, but faint. once in a while I will make out what they were saying".  Ideas of Reference:None  Suicidal Thoughts:Suicidal Thoughts: Yes, Passive SI Passive Intent and/or Plan: Without Intent; Without Plan; Without Means to Carry Out; Without Access to Means  Homicidal Thoughts:Homicidal Thoughts: No HI Passive Intent and/or Plan: Without Intent; Without Means to Carry Out; Without Plan; Without Access to Means   Sensorium  Memory: Immediate Good; Recent Good; Remote Good  Judgment: Fair  Insight: Fair   Art therapist  Concentration: Fair  Attention Span: Fair  Recall: Dudley Major of Knowledge: Fair  Language: Good  Psychomotor Activity  Psychomotor Activity: Psychomotor Activity: Normal  Assets  Assets: Communication Skills; Desire for Improvement; Housing; Physical Health;  Resilience; Social Support  Sleep  Sleep: Sleep: Good Number of Hours of Sleep: 7.5  Physical Exam: Physical Exam Vitals and nursing note reviewed.  HENT:     Mouth/Throat:  Pharynx: Oropharynx is clear.  Cardiovascular:     Rate and Rhythm: Normal rate.     Pulses: Normal pulses.  Pulmonary:     Effort: Pulmonary effort is normal.  Genitourinary:    Comments: Deferred Musculoskeletal:        General: Normal range of motion.  Skin:    General: Skin is warm and dry.  Neurological:     General: No focal deficit present.     Mental Status: He is alert and oriented to person, place, and time.    Review of Systems  Constitutional:  Negative for chills, diaphoresis and fever.  HENT:  Negative for congestion and sore throat.   Respiratory:  Negative for cough, shortness of breath and wheezing.   Cardiovascular:  Negative for chest pain and palpitations.  Gastrointestinal:  Negative for abdominal pain, constipation, diarrhea, heartburn, nausea and vomiting.  Musculoskeletal:  Negative for joint pain and myalgias.  Neurological:  Negative for dizziness, tingling, tremors, sensory change, speech change, focal weakness, seizures, loss of consciousness, weakness and headaches.  Endo/Heme/Allergies:        Allergies: NKDA  Psychiatric/Behavioral:  Positive for depression, hallucinations (Says the hallucinations are faint today.) and suicidal ideas (Passive ideation, denies any plans or intent.). Negative for memory loss and substance abuse. The patient is not nervous/anxious and does not have insomnia.    Blood pressure (!) 140/82, pulse 98, temperature 98.1 F (36.7 C), temperature source Oral, resp. rate 14, height 5\' 7"  (1.702 m), weight (!) 167.4 kg, SpO2 96%. Body mass index is 57.79 kg/m.  Treatment Plan Summary: Daily contact with patient to assess and evaluate symptoms and progress in treatment and Medication management.   Continue inpatient hospitalization.  Will  continue today 12/20/2022 plan as below except where it is noted.   Principal/active diagnoses. 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 The patient consents to medication trial.    -Continue Haldol 10 mg po bid for psychosis.  -Continue Depakote ER from 500 mg to 100 mg po Q hs for mood stabilization. -Continue Cogentin 0.5 mg po bid for prevention of eps.  -Continue Hydroxyzine 25 mg po tid prn for anxiety.  -Continue Trazodone 50 mg po Q hs prn for insomnia.  -Continue Propranolol 10 mg po bid for elevated pulse rate.  -Continue Vit D3 1000 units po for vit D 3 replacement.    Agitation protocols: Cont as recommended;  -Benadryl 50 mg po or IM tid prn. -Haldol 5 mg po or IM tid prn.  -Lorazepam 2 mg po or IM tid prn.   Other medical concerns being addressed.  -Continue metformin 500 mg po bid for DM management.    Other PRNS -Continue Tylenol 650 mg every 6 hours PRN for mild pain -Continue Maalox 30 ml Q 4 hrs PRN for indigestion -Continue MOM 30 ml po Q 6 hrs for constipation   Safety and Monitoring: Voluntary admission to inpatient psychiatric unit for safety, stabilization and treatment Daily contact with patient to assess and evaluate symptoms and progress in treatment Patient's case to be discussed in multi-disciplinary team meeting Observation Level : q15 minute checks Vital signs: q12 hours Precautions: Safety   Discharge Planning: Social work and case management to assist with discharge planning and identification of hospital follow-up needs prior to discharge Estimated LOS: 5-7 days Discharge Concerns: Need to establish a safety plan; Medication  compliance and effectiveness Discharge Goals: Return home with outpatient referrals for mental health follow-up including medication  management/psychotherapy  Armandina Stammer, NP, pmhnp, fnp-bc. 12/20/2022, 4:30 PM Patient ID: Pistol Grzeskowiak, male   DOB: Aug 01, 1995, 27 y.o.   MRN: [id number]

## 2022-12-20 NOTE — Plan of Care (Signed)

## 2022-12-21 DIAGNOSIS — F203 Undifferentiated schizophrenia: Secondary | ICD-10-CM | POA: Diagnosis not present

## 2022-12-21 MED ORDER — IBUPROFEN 600 MG PO TABS: 600.0000 mg | ORAL_TABLET | Freq: Four times a day (QID) | ORAL | Status: DC | PRN

## 2022-12-21 NOTE — Plan of Care (Signed)

## 2022-12-21 NOTE — Progress Notes (Signed)
   12/21/22 0603  15 Minute Checks  Location Bedroom  Visual Appearance Calm  Behavior Composed  Sleep (Behavioral Health Patients Only)  Calculate sleep? (Click Yes once per 24 hr at 0600 safety check) Yes  Documented sleep last 24 hours 7.5

## 2022-12-21 NOTE — Progress Notes (Signed)
   12/21/22 2144  Psych Admission Type (Psych Patients Only)  Admission Status Voluntary  Psychosocial Assessment  Patient Complaints Anxiety  Eye Contact Fair  Facial Expression Animated  Affect Preoccupied  Speech Soft  Interaction Assertive;Intrusive;Needy  Motor Activity Other (Comment) (WDL)  Appearance/Hygiene Unremarkable  Behavior Characteristics Hypersexual;Intrusive;Anxious  Mood Preoccupied  Thought Process  Coherency Circumstantial  Content Preoccupation  Delusions Paranoid  Perception Hallucinations  Hallucination Auditory  Judgment Impaired  Confusion None  Danger to Self  Current suicidal ideation? Denies  Self-Injurious Behavior No self-injurious ideation or behavior indicators observed or expressed   Agreement Not to Harm Self Yes  Description of Agreement verbal  Danger to Others  Danger to Others None reported or observed

## 2022-12-21 NOTE — Group Note (Signed)
Date:  12/21/2022 Time:  9:14 PM  Group Topic/Focus:  Wrap-Up Group:   The focus of this group is to help patients review their daily goal of treatment and discuss progress on daily workbooks.    Participation Level:  Active  Participation Quality:  Appropriate  Affect:  Appropriate  Cognitive:  Appropriate  Insight: Appropriate  Engagement in Group:  Developing/Improving  Modes of Intervention:  Discussion  Additional Comments:  Pt stated his goal for today was to focus on his treatment plan and attend all groups held. Pt stated he accomplished his goals today. Pt stated he talked with his doctor and social worker about his care today. Pt rated his overall day a 10. Pt stated the plan is for him to discharge on 12/24/2022. Pt stated he was able to contact his parents and his brother today which improved his overall day. Pt stated he felt better about himself today. Pt stated he was able to attend all meals. Pt stated he took all medications provided today. Pt stated he attend all groups held today. Pt stated his appetite was pretty good today. Pt rated sleep last night was pretty good. Pt stated the goal tonight was to get some rest. Pt stated he had no physical pain tonight. Pt deny visual hallucinations and auditory issues tonight. Pt denies thoughts of harming himself or others. Pt stated he would alert staff if anything changed  Noah Ramsey 12/21/2022, 9:14 PM

## 2022-12-21 NOTE — Progress Notes (Signed)
   12/21/22 1300  Psych Admission Type (Psych Patients Only)  Admission Status Voluntary  Psychosocial Assessment  Patient Complaints Worrying  Eye Contact Fair  Facial Expression Flat  Affect Depressed  Speech Logical/coherent  Interaction Minimal  Motor Activity Slow  Appearance/Hygiene Unremarkable  Behavior Characteristics Cooperative  Mood Preoccupied  Thought Process  Coherency Circumstantial  Content Preoccupation  Delusions Paranoid  Perception Hallucinations  Hallucination Auditory  Judgment Impaired  Confusion None  Danger to Self  Current suicidal ideation? Denies  Danger to Others  Danger to Others None reported or observed

## 2022-12-21 NOTE — Group Note (Signed)
Date:  12/21/2022 Time:  10:52 AM  Group Topic/Focus:  Goals Group:   The focus of this group is to help patients establish daily goals to achieve during treatment and discuss how the patient can incorporate goal setting into their daily lives to aide in recovery. Orientation:   The focus of this group is to educate the patient on the purpose and policies of crisis stabilization and provide a format to answer questions about their admission.  The group details unit policies and expectations of patients while admitted.    Participation Level:  Active  Participation Quality:  Appropriate  Affect:  Appropriate  Cognitive:  Appropriate  Insight: Appropriate  Engagement in Group:  Engaged  Modes of Intervention:  Discussion  Additional Comments:  Patient attended morning orientation/goal setting group and participated.    Gagandeep Pettet W Bernadine Melecio 12/21/2022, 10:52 AM

## 2022-12-21 NOTE — Progress Notes (Signed)
[facility] MD Progress Note  12/21/2022 3:35 PM Sham Christlieb  MRN:  [id number]  Reason for admission: 27 year old obese AA male with hx of schizophrenia. Patient is known to this [facility] from his previous admission/treatments for similar complaints. He is also known in the other psychiatric hospitals with the surrounding areas for treatment of psychosis. Patient per his report, he is currently being followed by [facility] on an outpatient basis. He is being admitted this time around from the [facility] with complaint of worsening psychosis. After evaluation at [facility], he was transferred to the [facility] for further psychiatric evaluation/treatments. A review of his toxicology reports has shown negative of all substances   24 hr chart review: Sleep Hours last night: 7.5 hrs as per nursing documentation Nursing Concerns: None reported Behavioral episodes in the past 24 hrs: Medication Compliance: Compliant  Vital Signs in the past 24 hrs: Persistent elevations in the SBP in the 130s to 150s since admission. HR has also had sustained elevations in the low 100s. PRN Medications in the past 24 hrs: Trazodone, Hydroxyzine  Patient assessment note:  Mood is depressed, but sleep is clear, coherent & ideas are logical and organized. Pt denies SI/HI/AVH, denies feelings of paranoia, states that the last time that he last had AH on the day of this hospitalization, states that the Haldol is helpful for the psychosis. He denies paranoia, denies all first rank symptoms. Denies medication related side effects, denies being in any physical pain, denies issues with Bms, states that the last one was earlier today morning. Reports a good sleep quality, reports a good appetite.  Patient perseverates about being unable to concentrate, talks about having racing thoughts, states that he had "mild cognitive impairment", but states that this is not the issue. He talks about feeling "sad", and "down" since being ,diagnosed Schizophrenia in 2018,  states that in the past, Wellbutrin at 75mg  daily has been helpful with the way he is feeling currently. He states that he was taken off this medication due to an increase to 150 mg which caused him worsening of his psychosis.   Reviewed Labs: Last Valproic acid level was completed 4 days ago. Ordering another for 6 pm tomorrow, 09/01. Repeat EKG ordered d/t previous one with R BBB  Pt reports trials of Effexor in the past which was not helpful for depression. Asking for Wellbutrin 75 mg daily for depressive symptoms and trouble with concentration.  We will continue to monitor symptoms, and if patient remains consistent with his report of depressive symptoms and inability to focus tomorrow, we will consider adding Wellbutrin 75 mg to current medication regimen, or adding another antidepressant medication.  We are continuing medications as listed below with no changes at this time.  No TD/EPS type symptoms found on assessment, and pt denies any feelings of stiffness. AIMS: 0.   Principal Problem: Undifferentiated schizophrenia (HCC)  Diagnosis: Principal Problem:   Undifferentiated schizophrenia (HCC) Active Problems:   Paranoid schizophrenia (HCC)  Total Time spent with patient: 45 minutes  Past Psychiatric History:  Patient reports he was first admitted in a psychiatric hospital in [place], [place]. Says has been admitted in a psychiatric hospitals been admitted to the psychiatric hospital for at least 10-16 times in his life time. His most recent hospitalization was approximately 2 years ago [facility]. Says has been admitted to the [facility] in [place], [place]. With similar complaints. He was being followed by Dr. Vance Peper now is with [facility].   Past  Medical History:  Past Medical History:  Diagnosis Date   Complication of anesthesia    Elevated CPK    per patient   Schizophrenia [facility])    History reviewed. No pertinent surgical history.  Family History:  Family History  Problem  Relation Age of Onset   Mental illness Brother    Family Psychiatric  History: Patient reports, has a brother with mental illness, drug addiction & homeless. Says mental illnesses run his family.    Social History:  Social History   Substance and Sexual Activity  Alcohol Use Never     Social History   Substance and Sexual Activity  Drug Use Never    Social History   Socioeconomic History   Marital status: Single    Spouse name: Not on file   Number of children: Not on file   Years of education: Not on file   Highest education level: Associate degree: occupational, Scientist, product/process development, or vocational program  Occupational History   Not on file  Tobacco Use   Smoking status: Former    Current packs/day: 0.00    Types: Cigarettes    Quit date: 2023    Years since quitting: 1.6   Smokeless tobacco: Never  Vaping Use   Vaping status: Not on file  Substance and Sexual Activity   Alcohol use: Never   Drug use: Never   Sexual activity: Not on file  Other Topics Concern   Not on file  Social History Narrative   Not on file   Social Determinants of Health   Financial Resource Strain: Low Risk  (07/23/2022)   Overall Financial Resource Strain (CARDIA)    Difficulty of Paying Living Expenses: Not hard at all  Food Insecurity: No Food Insecurity (12/17/2022)   Hunger Vital Sign    Worried About Running Out of Food in the Last Year: Never true    Ran Out of Food in the Last Year: Never true  Transportation Needs: No Transportation Needs (12/17/2022)   PRAPARE - Administrator, Civil Service (Medical): No    Lack of Transportation (Non-Medical): No  Physical Activity: Sufficiently Active (07/23/2022)   Exercise Vital Sign    Days of Exercise per Week: 4 days    Minutes of Exercise per Session: 90 min  Stress: Stress Concern Present (07/23/2022)   [employer] of Occupational Health - Occupational Stress Questionnaire    Feeling of Stress : Rather much  Social  Connections: Unknown (12/02/2022)   Received from [facility]   Social Network    Social Network: Not on file   Additional Social History:   Sleep: Good  Appetite:  Good  Current Medications: Current Facility-Administered Medications  Medication Dose Route Frequency Provider Last Rate Last Admin   acetaminophen (TYLENOL) tablet 650 mg  650 mg Oral Q6H PRN Ardis Hughs, NP       alum & mag hydroxide-simeth (MAALOX/MYLANTA) 200-200-20 MG/5ML suspension 30 mL  30 mL Oral Q4H PRN Ardis Hughs, NP       benztropine (COGENTIN) tablet 0.5 mg  0.5 mg Oral BID Massengill, Nathan, MD   0.5 mg at 12/21/22 0814   diphenhydrAMINE (BENADRYL) capsule 50 mg  50 mg Oral TID PRN Ardis Hughs, NP   50 mg at 12/18/22 1046   Or   diphenhydrAMINE (BENADRYL) injection 50 mg  50 mg Intramuscular TID PRN Ardis Hughs, NP       divalproex (DEPAKOTE ER) 24 hr tablet 1,000 mg  1,000  mg Oral QHS Massengill, Harrold Donath, MD   1,000 mg at 12/20/22 2041   haloperidol (HALDOL) tablet 10 mg  10 mg Oral Q12H Massengill, Harrold Donath, MD   10 mg at 12/21/22 0813   haloperidol (HALDOL) tablet 5 mg  5 mg Oral TID PRN Ardis Hughs, NP   5 mg at 12/18/22 1046   Or   haloperidol lactate (HALDOL) injection 5 mg  5 mg Intramuscular TID PRN Ardis Hughs, NP       hydrOXYzine (ATARAX) tablet 25 mg  25 mg Oral TID PRN Ardis Hughs, NP   25 mg at 12/20/22 2042   LORazepam (ATIVAN) tablet 2 mg  2 mg Oral TID PRN Ardis Hughs, NP   2 mg at 12/18/22 1046   Or   LORazepam (ATIVAN) injection 2 mg  2 mg Intramuscular TID PRN Ardis Hughs, NP       magnesium hydroxide (MILK OF MAGNESIA) suspension 30 mL  30 mL Oral Daily PRN Ardis Hughs, NP       metFORMIN (GLUCOPHAGE) tablet 500 mg  500 mg Oral BID WC Ardis Hughs, NP   500 mg at 12/20/22 1722   multivitamin with minerals tablet 1 tablet  1 tablet Oral Daily Ardis Hughs, NP   1 tablet at 12/20/22 1610   propranolol  (INDERAL) tablet 10 mg  10 mg Oral Q8H Massengill, Nathan, MD   10 mg at 12/21/22 1350   traZODone (DESYREL) tablet 50 mg  50 mg Oral QHS PRN Ardis Hughs, NP   50 mg at 12/20/22 2041   vitamin D3 (CHOLECALCIFEROL) tablet 1,000 Units  1,000 Units Oral Daily Phineas Inches, MD   1,000 Units at 12/20/22 9604   Lab Results: No results found for this or any previous visit (from the past 48 hour(s)).  Blood Alcohol level:  Lab Results  Component Value Date   ETH <10 12/17/2022   ETH <10 08/28/2021   Metabolic Disorder Labs: Lab Results  Component Value Date   HGBA1C 6.1 (H) 12/17/2022   MPG 128.37 12/17/2022   MPG 111.15 12/04/2020   No results found for: "PROLACTIN" Lab Results  Component Value Date   CHOL 154 12/17/2022   TRIG 70 12/17/2022   HDL 40 (L) 12/17/2022   CHOLHDL 3.9 12/17/2022   VLDL 14 12/17/2022   LDLCALC 100 (H) 12/17/2022   LDLCALC 93 11/06/2021   Physical Findings: AIMS:  , ,  ,  ,    CIWA:    COWS:     Musculoskeletal: Strength & Muscle Tone: within normal limits Gait & Station: normal Patient leans: N/A  Psychiatric Specialty Exam:  Presentation  General Appearance:  Casual  Eye Contact: Fair  Speech: Clear and Coherent  Speech Volume: Normal  Handedness: Right  Mood and Affect  Mood: Depressed  Affect: Congruent  Thought Process  Thought Processes: Coherent  Descriptions of Associations:Intact  Orientation:Full (Time, Place and Person)  Thought Content:Logical  History of Schizophrenia/Schizoaffective disorder:Yes  Duration of Psychotic Symptoms:Greater than six months  Hallucinations:Hallucinations: None   Ideas of Reference:None  Suicidal Thoughts:Suicidal Thoughts: No   Homicidal Thoughts:Homicidal Thoughts: No    Sensorium  Memory: Immediate Good  Judgment: Fair  Insight: Fair   Art therapist  Concentration: Fair  Attention Span: Good  Recall: Good  Fund of  Knowledge: Fair  Language: Fair  Psychomotor Activity  Psychomotor Activity: Psychomotor Activity: Normal   Assets  Assets: Resilience  Sleep  Sleep: Sleep: Good  Physical Exam: Physical Exam Vitals and nursing note reviewed.  HENT:     Mouth/Throat:     Pharynx: Oropharynx is clear.  Cardiovascular:     Rate and Rhythm: Normal rate.     Pulses: Normal pulses.  Pulmonary:     Effort: Pulmonary effort is normal.  Genitourinary:    Comments: Deferred Musculoskeletal:        General: Normal range of motion.  Skin:    General: Skin is warm and dry.  Neurological:     General: No focal deficit present.     Mental Status: He is alert and oriented to person, place, and time.    Review of Systems  Constitutional:  Negative for chills, diaphoresis and fever.  HENT:  Negative for congestion and sore throat.   Respiratory:  Negative for cough, shortness of breath and wheezing.   Cardiovascular:  Negative for chest pain and palpitations.  Gastrointestinal:  Negative for abdominal pain, constipation, diarrhea, heartburn, nausea and vomiting.  Musculoskeletal:  Negative for joint pain and myalgias.  Neurological:  Negative for dizziness, tingling, tremors, sensory change, speech change, focal weakness, seizures, loss of consciousness, weakness and headaches.  Endo/Heme/Allergies:        Allergies: NKDA  Psychiatric/Behavioral:  Positive for depression and hallucinations (Says the hallucinations are faint today.). Negative for substance abuse and suicidal ideas (Passive ideation, denies any plans or intent.). The patient is not nervous/anxious and does not have insomnia.    Blood pressure 125/80, pulse (!) 104, temperature (!) 97.5 F (36.4 C), temperature source Oral, resp. rate 20, height 5\' 7"  (1.702 m), weight (!) 167.4 kg, SpO2 95%. Body mass index is 57.79 kg/m.  Treatment Plan Summary: Daily contact with patient to assess and evaluate symptoms and progress in  treatment and Medication management.   Continue inpatient hospitalization.  Will continue today 12/21/2022 plan as below except where it is noted.   Principal/active diagnoses. 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 The patient consents to medication trial.    Medications -Continue Haldol 10 mg po bid for psychosis.  -Continue Depakote ER from 500 mg to 100 mg po Q hs for mood stabilization. -Continue Cogentin 0.5 mg po bid for prevention of eps.  -Continue Hydroxyzine 25 mg po tid prn for anxiety.  -Continue Trazodone 50 mg po Q hs prn for insomnia.  -Continue Propranolol 10 mg po bid for elevated pulse rate.  -Continue Vit D3 1000 units po for vit D 3 replacement.    Agitation protocols: Cont as recommended;  -Benadryl 50 mg po or IM tid prn. -Haldol 5 mg po or IM tid prn.  -Lorazepam 2 mg po or IM tid prn.   Other medical concerns being addressed.  -Continue metformin 500 mg po bid for DM management.    Other PRNS -Continue Tylenol 650 mg every 6 hours PRN for mild pain -Continue Maalox 30 ml Q 4 hrs PRN for indigestion -Continue MOM 30 ml po Q 6 hrs for constipation   Safety and Monitoring: Voluntary admission to inpatient psychiatric unit for safety, stabilization and treatment Daily contact with patient to assess and evaluate symptoms and progress in treatment Patient's case to be discussed in multi-disciplinary team meeting Observation Level : q15 minute checks Vital signs: q12 hours Precautions: Safety   Discharge Planning: Social work and case management to assist with discharge planning and identification  of hospital follow-up needs prior to discharge Estimated LOS: 5-7 days Discharge Concerns: Need to establish a safety plan; Medication compliance and effectiveness Discharge  Goals: Return home with outpatient referrals for mental health follow-up including medication management/psychotherapy  Starleen Blue, NP, pmhnp. 12/21/2022, 3:35 PM Patient ID: [name], male   DOB: Apr 01, 1996, 27 y.o.   MRN: [id number]

## 2022-12-22 DIAGNOSIS — F203 Undifferentiated schizophrenia: Secondary | ICD-10-CM | POA: Diagnosis not present

## 2022-12-22 LAB — VALPROIC ACID LEVEL: Valproic Acid Lvl: 35 ug/mL — ABNORMAL LOW (ref 50.0–100.0)

## 2022-12-22 LAB — HEPATIC FUNCTION PANEL
ALT: 30 U/L (ref 0–44)
AST: 41 U/L (ref 15–41)
Albumin: 3.8 g/dL (ref 3.5–5.0)
Alkaline Phosphatase: 64 U/L (ref 38–126)
Bilirubin, Direct: <0.1 mg/dL (ref 0.0–0.2)
Total Bilirubin: 0.5 mg/dL (ref 0.3–1.2)
Total Protein: 8 g/dL (ref 6.5–8.1)

## 2022-12-22 LAB — AMMONIA: Ammonia: 59 umol/L — ABNORMAL HIGH (ref 9–35)

## 2022-12-22 MED ORDER — HALOPERIDOL DECANOATE 100 MG/ML IM SOLN
100.0000 mg | INTRAMUSCULAR | Status: DC
  Administered 2022-12-23: 100 mg via INTRAMUSCULAR
  Filled 2022-12-22: qty 1

## 2022-12-22 NOTE — [facility] Group Notes (Signed)
Thomann did not attend.

## 2022-12-22 NOTE — Plan of Care (Signed)
  Problem: Education: Goal: Knowledge of  General Education information/materials will improve Outcome: Progressing   Problem: Education: Goal: Verbalization of understanding the information provided will improve Outcome: Progressing

## 2022-12-22 NOTE — [facility] Group Notes (Signed)
Adult Psychoeducational Group Note  Date:  12/22/2022 Time:  11:00 AM  Group Topic/Focus:  Goals Group:   The focus of this group is to help patients establish daily goals to achieve during treatment and discuss how the patient can incorporate goal setting into their daily lives to aide in recovery.  Participation Level:  Active  Participation Quality:  Appropriate  Affect:  Appropriate  Cognitive:  Appropriate  Insight: Appropriate  Engagement in Group:  Engaged  Modes of Intervention:  Discussion  Additional Comments:  Pt attended goals group and was engaged throughout.  Harriet Masson 12/22/2022, 11:00 AM

## 2022-12-22 NOTE — Progress Notes (Signed)
Patient noted to be cooperative on shift . Medication and group compliant. Minimal interaction observed with peers. Pt noted to be interacting well with staff. Tylenol 650 mg po administered for complaints of back pain with effective results.  Pt denied SI/HI and AVH. Pt able to tolerate meals in the cafeteria. Safety maintained.  12/22/22 0815  Psych Admission Type (Psych Patients Only)  Admission Status Voluntary  Psychosocial Assessment  Patient Complaints None  Eye Contact Fair  Facial Expression Flat  Affect Sad  Speech Logical/coherent;Soft  Interaction Minimal  Motor Activity Slow  Appearance/Hygiene Unremarkable  Behavior Characteristics Anxious;Cooperative  Mood Suspicious;Preoccupied  Thought Process  Coherency Circumstantial  Content Preoccupation  Delusions Paranoid  Perception WDL  Hallucination Auditory;None reported or observed  Judgment Impaired  Confusion None  Danger to Self  Current suicidal ideation? Denies  Self-Injurious Behavior No self-injurious ideation or behavior indicators observed or expressed   Agreement Not to Harm Self Yes  Description of Agreement Verbal  Danger to Others  Danger to Others None reported or observed

## 2022-12-22 NOTE — Progress Notes (Signed)
   12/22/22 0600  15 Minute Checks  Location Bedroom  Visual Appearance Calm  Behavior Composed  Sleep (Behavioral Health Patients Only)  Calculate sleep? (Click Yes once per 24 hr at 0600 safety check) Yes  Documented sleep last 24 hours 7.25

## 2022-12-22 NOTE — Plan of Care (Signed)
  Problem: Education: Goal: Emotional status will improve Outcome: Progressing Goal: Mental status will improve Outcome: Progressing   Problem: Coping: Goal: Ability to demonstrate self-control will improve Outcome: Progressing   Problem: Safety: Goal: Periods of time without injury will increase Outcome: Progressing   

## 2022-12-22 NOTE — Progress Notes (Addendum)
[facility] MD Progress Note  12/22/2022 3:08 PM Avrey Tanaka  MRN:  [id number]  Reason for admission: 27 year old obese AA male with hx of schizophrenia. Patient is known to this [facility] from his previous admission/treatments for similar complaints. He is also known in the other psychiatric hospitals with the surrounding areas for treatment of psychosis. Patient per his report, he is currently being followed by [facility] on an outpatient basis. He is being admitted this time around from the [facility] with complaint of worsening psychosis. After evaluation at [facility], he was transferred to the [facility] for further psychiatric evaluation/treatments. A review of his toxicology reports has shown negative of all substances   24 hr chart review: Sleep Hours last night: 7.5 hrs as per nursing documentation Nursing Concerns: None reported Behavioral episodes in the past 24 AOZ:HYQM  Medication Compliance: Compliant  Vital Signs in the past 24 hrs: WNL PRN Medications in the past 24 hrs: Trazodone, Hydroxyzine  Patient assessment note:  On assessment today, the pt reports that their mood is continuing to improve on current medications.  Reports that anxiety is moderate, and also resolving. Sleep is good, states that he slept all night last night Appetite is WNL Concentration is average, and better today than yesterday.  Energy level is average Denies suicidal thoughts. Denies suicidal intent and plan.  Denies having any HI.  Denies having psychotic symptoms.   Denies having side effects to current psychiatric medications.  No TD/EPS type symptoms found on assessment, and pt denies any feelings of stiffness. AIMS: 0.  We discussed changes to current medication regimen, including adding Haldol LAI to medication regimen. Educated patient on the benefits, rationales & possible side effects of the LAI, verbalized understanding and agreeable to taking.  Educated patient that discharge will most likely be on Tuesday, 9/3,  verbalizes understanding of the fact that appointments for outpatient f/u need to be in place prior to discharge.  Reviewed Labs: Last Valproic acid level was completed 4 days ago. Ordering another for 6 pm today, 09/01. Repeat EKG ordered d/t previous one with R BBB. New EKG from yesterday was WNL.  Principal Problem: Undifferentiated schizophrenia (HCC)  Diagnosis: Principal Problem:   Undifferentiated schizophrenia (HCC) Active Problems:   Paranoid schizophrenia (HCC)  Total Time spent with patient: 45 minutes  Past Psychiatric History:  Patient reports he was first admitted in a psychiatric hospital in [place], [place]. Says has been admitted in a psychiatric hospitals been admitted to the psychiatric hospital for at least 10-16 times in his life time. His most recent hospitalization was approximately 2 years ago here at [facility]. Says has been admitted to the [facility] in [place], [place]. With similar complaints. He was being followed by Dr. Vance Peper now is with [facility].   Past Medical History:  Past Medical History:  Diagnosis Date   Complication of anesthesia    Elevated CPK    per patient   Schizophrenia [facility])    History reviewed. No pertinent surgical history.  Family History:  Family History  Problem Relation Age of Onset   Mental illness Brother    Family Psychiatric  History: Patient reports, has a brother with mental illness, drug addiction & homeless. Says mental illnesses run his family.    Social History:  Social History   Substance and Sexual Activity  Alcohol Use Never     Social History   Substance and Sexual Activity  Drug Use Never    Social History   Socioeconomic History  Marital status: Single    Spouse name: Not on file   Number of children: Not on file   Years of education: Not on file   Highest education level: Associate degree: occupational, Scientist, product/process development, or vocational program  Occupational History   Not on file  Tobacco Use   Smoking  status: Former    Current packs/day: 0.00    Types: Cigarettes    Quit date: 2023    Years since quitting: 1.6   Smokeless tobacco: Never  Vaping Use   Vaping status: Not on file  Substance and Sexual Activity   Alcohol use: Never   Drug use: Never   Sexual activity: Not on file  Other Topics Concern   Not on file  Social History Narrative   Not on file   Social Determinants of Health   Financial Resource Strain: Low Risk  (07/23/2022)   Overall Financial Resource Strain (CARDIA)    Difficulty of Paying Living Expenses: Not hard at all  Food Insecurity: No Food Insecurity (12/17/2022)   Hunger Vital Sign    Worried About Running Out of Food in the Last Year: Never true    Ran Out of Food in the Last Year: Never true  Transportation Needs: No Transportation Needs (12/17/2022)   PRAPARE - Administrator, Civil Service (Medical): No    Lack of Transportation (Non-Medical): No  Physical Activity: Sufficiently Active (07/23/2022)   Exercise Vital Sign    Days of Exercise per Week: 4 days    Minutes of Exercise per Session: 90 min  Stress: Stress Concern Present (07/23/2022)   [employer] of Occupational Health - Occupational Stress Questionnaire    Feeling of Stress : Rather much  Social Connections: Unknown (12/02/2022)   [facility]   Social Network    Social Network: Not on file   Additional Social History:   Sleep: Good  Appetite:  Good  Current Medications: Current Facility-Administered Medications  Medication Dose Route Frequency Provider Last Rate Last Admin   acetaminophen (TYLENOL) tablet 650 mg  650 mg Oral Q6H PRN Ardis Hughs, NP   650 mg at 12/22/22 1043   alum & mag hydroxide-simeth (MAALOX/MYLANTA) 200-200-20 MG/5ML suspension 30 mL  30 mL Oral Q4H PRN Ardis Hughs, NP       benztropine (COGENTIN) tablet 0.5 mg  0.5 mg Oral BID Massengill, Nathan, MD   0.5 mg at 12/22/22 0809   diphenhydrAMINE (BENADRYL) capsule 50 mg   50 mg Oral TID PRN Ardis Hughs, NP   50 mg at 12/18/22 1046   Or   diphenhydrAMINE (BENADRYL) injection 50 mg  50 mg Intramuscular TID PRN Ardis Hughs, NP       divalproex (DEPAKOTE ER) 24 hr tablet 1,000 mg  1,000 mg Oral QHS Massengill, Harrold Donath, MD   1,000 mg at 12/21/22 2033   haloperidol (HALDOL) tablet 10 mg  10 mg Oral Q12H Massengill, Harrold Donath, MD   10 mg at 12/22/22 4098   haloperidol (HALDOL) tablet 5 mg  5 mg Oral TID PRN Ardis Hughs, NP   5 mg at 12/18/22 1046   Or   haloperidol lactate (HALDOL) injection 5 mg  5 mg Intramuscular TID PRN Ardis Hughs, NP       hydrOXYzine (ATARAX) tablet 25 mg  25 mg Oral TID PRN Ardis Hughs, NP   25 mg at 12/21/22 2032   ibuprofen (ADVIL) tablet 600 mg  600 mg Oral Q6H PRN  Bobbitt, Shalon E, NP       LORazepam (ATIVAN) tablet 2 mg  2 mg Oral TID PRN Ardis Hughs, NP   2 mg at 12/18/22 1046   Or   LORazepam (ATIVAN) injection 2 mg  2 mg Intramuscular TID PRN Ardis Hughs, NP       magnesium hydroxide (MILK OF MAGNESIA) suspension 30 mL  30 mL Oral Daily PRN Ardis Hughs, NP       metFORMIN (GLUCOPHAGE) tablet 500 mg  500 mg Oral BID WC Ardis Hughs, NP   500 mg at 12/22/22 0809   multivitamin with minerals tablet 1 tablet  1 tablet Oral Daily Ardis Hughs, NP   1 tablet at 12/22/22 0809   propranolol (INDERAL) tablet 10 mg  10 mg Oral Q8H Massengill, Harrold Donath, MD   10 mg at 12/22/22 1431   traZODone (DESYREL) tablet 50 mg  50 mg Oral QHS PRN Ardis Hughs, NP   50 mg at 12/21/22 2033   vitamin D3 (CHOLECALCIFEROL) tablet 1,000 Units  1,000 Units Oral Daily Phineas Inches, MD   1,000 Units at 12/22/22 0809   Lab Results: No results found for this or any previous visit (from the past 48 hour(s)).  Blood Alcohol level:  Lab Results  Component Value Date   ETH <10 12/17/2022   ETH <10 08/28/2021   Metabolic Disorder Labs: Lab Results  Component Value Date   HGBA1C 6.1 (H)  12/17/2022   MPG 128.37 12/17/2022   MPG 111.15 12/04/2020   No results found for: "PROLACTIN" Lab Results  Component Value Date   CHOL 154 12/17/2022   TRIG 70 12/17/2022   HDL 40 (L) 12/17/2022   CHOLHDL 3.9 12/17/2022   VLDL 14 12/17/2022   LDLCALC 100 (H) 12/17/2022   LDLCALC 93 11/06/2021   Physical Findings: AIMS:  , ,  ,  ,    CIWA:    COWS:     Musculoskeletal: Strength & Muscle Tone: within normal limits Gait & Station: normal Patient leans: N/A  Psychiatric Specialty Exam:  Presentation  General Appearance:  Appropriate for Environment; Casual  Eye Contact: Good  Speech: Clear and Coherent  Speech Volume: Normal  Handedness: Right  Mood and Affect  Mood: Depressed  Affect: Congruent  Thought Process  Thought Processes: Coherent  Descriptions of Associations:Intact  Orientation:Full (Time, Place and Person)  Thought Content:Logical  History of Schizophrenia/Schizoaffective disorder:Yes  Duration of Psychotic Symptoms:Greater than six months  Hallucinations:Hallucinations: None   Ideas of Reference:None  Suicidal Thoughts:Suicidal Thoughts: No   Homicidal Thoughts:Homicidal Thoughts: No    Sensorium  Memory: Immediate Good  Judgment: Fair  Insight: Fair   Art therapist  Concentration: Fair  Attention Span: Good  Recall: Good  Fund of Knowledge: Fair  Language: Fair  Psychomotor Activity  Psychomotor Activity: Psychomotor Activity: Normal   Assets  Assets: Resilience  Sleep  Sleep: Sleep: Good   Physical Exam: Physical Exam Vitals and nursing note reviewed.  HENT:     Mouth/Throat:     Pharynx: Oropharynx is clear.  Cardiovascular:     Rate and Rhythm: Normal rate.     Pulses: Normal pulses.  Pulmonary:     Effort: Pulmonary effort is normal.  Genitourinary:    Comments: Deferred Musculoskeletal:        General: Normal range of motion.  Skin:    General: Skin is warm and  dry.  Neurological:     General: No focal deficit present.  Mental Status: He is alert and oriented to person, place, and time.    Review of Systems  Constitutional:  Negative for chills, diaphoresis and fever.  HENT:  Negative for congestion and sore throat.   Respiratory:  Negative for cough, shortness of breath and wheezing.   Cardiovascular:  Negative for chest pain and palpitations.  Gastrointestinal:  Negative for abdominal pain, constipation, diarrhea, heartburn, nausea and vomiting.  Musculoskeletal:  Negative for joint pain and myalgias.  Neurological:  Negative for dizziness, tingling, tremors, sensory change, speech change, focal weakness, seizures, loss of consciousness, weakness and headaches.  Endo/Heme/Allergies:        Allergies: NKDA  Psychiatric/Behavioral:  Positive for depression and hallucinations (Says the hallucinations are faint today.). Negative for substance abuse and suicidal ideas (Passive ideation, denies any plans or intent.). The patient is not nervous/anxious and does not have insomnia.    Blood pressure 125/82, pulse 89, temperature (!) 97.5 F (36.4 C), temperature source Oral, resp. rate 18, height 5\' 7"  (1.702 m), weight (!) 167.4 kg, SpO2 95%. Body mass index is 57.79 kg/m.  Treatment Plan Summary: Daily contact with patient to assess and evaluate symptoms and progress in treatment and Medication management.   Continue inpatient hospitalization.  Will continue today 12/22/2022 plan as below except where it is noted.   Principal/active diagnoses. 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 The patient consents to medication trial.    Medications -Give Haldol Decanoate 100 mg IM on 9/2 for mood stabilization -Continue Haldol 10 mg po bid for psychosis.   -Continue Depakote ER from 500 mg to 100 mg po Q hs for mood stabilization. -Continue Cogentin 0.5 mg po bid for prevention of eps.  -Continue Hydroxyzine 25 mg po tid prn for anxiety.  -Continue Trazodone 50 mg po Q hs prn for insomnia.  -Continue Propranolol 10 mg po bid for elevated pulse rate.  -Continue Vit D3 1000 units po for vit D 3 replacement.    Agitation protocols: Cont as recommended;  -Benadryl 50 mg po or IM tid prn. -Haldol 5 mg po or IM tid prn.  -Lorazepam 2 mg po or IM tid prn.   Other medical concerns being addressed.  -Continue metformin 500 mg po bid for DM management.    Other PRNS -Continue Tylenol 650 mg every 6 hours PRN for mild pain -Continue Maalox 30 ml Q 4 hrs PRN for indigestion -Continue MOM 30 ml po Q 6 hrs for constipation   Safety and Monitoring: Voluntary admission to inpatient psychiatric unit for safety, stabilization and treatment Daily contact with patient to assess and evaluate symptoms and progress in treatment Patient's case to be discussed in multi-disciplinary team meeting Observation Level : q15 minute checks Vital signs: q12 hours Precautions: Safety   Discharge Planning: Social work and case management to assist with discharge planning and identification of hospital follow-up needs prior to discharge Estimated LOS: 5-7 days Discharge Concerns: Need to establish a safety plan; Medication compliance and effectiveness Discharge Goals: Return home with outpatient referrals for mental health follow-up including medication management/psychotherapy  Starleen Blue, NP, pmhnp. 12/22/2022, 3:08 PM Patient ID: [name], male   DOB: [birth year], 27 y.o.   MRN: [id number]

## 2022-12-22 NOTE — Progress Notes (Signed)
   12/22/22 1945  Psych Admission Type (Psych Patients Only)  Admission Status Voluntary  Psychosocial Assessment  Patient Complaints Worrying  Eye Contact Fair  Facial Expression Flat  Affect Preoccupied  Speech Soft;Slow  Interaction Attention-seeking  Motor Activity Slow  Appearance/Hygiene Unremarkable  Behavior Characteristics Cooperative;Anxious;Calm  Mood Suspicious;Preoccupied;Pleasant  Aggressive Behavior  Effect No apparent injury  Thought Process  Coherency Circumstantial  Content Blaming others;Preoccupation  Delusions Paranoid  Perception Hallucinations  Hallucination Auditory  Judgment Impaired  Confusion WDL  Danger to Self  Current suicidal ideation? Denies  Danger to Others  Danger to Others None reported or observed

## 2022-12-22 NOTE — Plan of Care (Signed)
  Problem: Education: Goal: Emotional status will improve Outcome: Progressing   Problem: Education: Goal: Mental status will improve Outcome: Progressing   Problem: Activity: Goal: Interest or engagement in activities will improve Outcome: Progressing   Problem: Activity: Goal: Sleeping patterns will improve Outcome: Progressing   Problem: Safety: Goal: Periods of time without injury will increase Outcome: Progressing   Problem: Physical Regulation: Goal: Ability to maintain clinical measurements within normal limits will improve Outcome: Progressing

## 2022-12-23 ENCOUNTER — Encounter (HOSPITAL_COMMUNITY): Payer: Self-pay

## 2022-12-23 DIAGNOSIS — F200 Paranoid schizophrenia: Secondary | ICD-10-CM | POA: Diagnosis not present

## 2022-12-23 MED ORDER — HALOPERIDOL DECANOATE 100 MG/ML IM SOLN
100.0000 mg | Freq: Once | INTRAMUSCULAR | Status: AC
  Administered 2022-12-26: 100 mg via INTRAMUSCULAR
  Filled 2022-12-23 (×2): qty 1

## 2022-12-23 MED ORDER — LACTULOSE 10 GM/15ML PO SOLN
10.0000 g | Freq: Two times a day (BID) | ORAL | Status: DC
  Administered 2022-12-23 – 2022-12-24 (×3): 10 g via ORAL
  Filled 2022-12-23 (×5): qty 15

## 2022-12-23 NOTE — Progress Notes (Signed)
   12/23/22 2000  Psych Admission Type (Psych Patients Only)  Admission Status Voluntary  Psychosocial Assessment  Patient Complaints Worrying  Eye Contact Fair  Facial Expression Flat  Affect Preoccupied  Speech Soft;Slow  Interaction Attention-seeking  Motor Activity Slow  Appearance/Hygiene Unremarkable  Behavior Characteristics Cooperative;Anxious  Mood Suspicious;Preoccupied;Pleasant  Aggressive Behavior  Effect No apparent injury  Thought Process  Coherency Circumstantial  Content Blaming others;Preoccupation  Delusions Paranoid  Perception Hallucinations  Hallucination Auditory  Judgment Impaired  Confusion WDL  Danger to Self  Current suicidal ideation? Denies  Danger to Others  Danger to Others None reported or observed

## 2022-12-23 NOTE — Plan of Care (Signed)

## 2022-12-23 NOTE — Progress Notes (Signed)
   12/23/22 1100  Psych Admission Type (Psych Patients Only)  Admission Status Voluntary  Psychosocial Assessment  Patient Complaints Worrying  Eye Contact Fair  Facial Expression Flat  Affect Preoccupied  Speech Logical/coherent;Soft;Slow  Interaction Guarded  Motor Activity Slow  Appearance/Hygiene Unremarkable  Behavior Characteristics Anxious;Cooperative  Mood Preoccupied;Pleasant;Suspicious  Thought Process  Coherency Circumstantial  Content Preoccupation  Delusions Paranoid  Perception Hallucinations  Hallucination Auditory  Judgment Poor  Confusion None  Danger to Self  Current suicidal ideation? Denies  Agreement Not to Harm Self Yes  Description of Agreement verbal  Danger to Others  Danger to Others None reported or observed

## 2022-12-23 NOTE — Plan of Care (Signed)
  Problem: Education: Goal: Emotional status will improve Outcome: Progressing Goal: Mental status will improve Outcome: Progressing   Problem: Activity: Goal: Interest or engagement in activities will improve Outcome: Progressing Goal: Sleeping patterns will improve Outcome: Progressing   Problem: Safety: Goal: Periods of time without injury will increase Outcome: Progressing   

## 2022-12-23 NOTE — BH IP Treatment Plan (Signed)
Interdisciplinary Treatment and Diagnostic Plan Update  12/23/2022 Time of Session: 9:45am (UPDATE) Noah Ramsey MRN: [id number]  Principal Diagnosis: Undifferentiated schizophrenia (HCC)  Secondary Diagnoses: Principal Problem:   Undifferentiated schizophrenia (HCC) Active Problems:   Paranoid schizophrenia (HCC)   Current Medications:  Current Facility-Administered Medications  Medication Dose Route Frequency Provider Last Rate Last Admin   acetaminophen (TYLENOL) tablet 650 mg  650 mg Oral Q6H PRN Ardis Hughs, NP   650 mg at 12/22/22 1657   alum & mag hydroxide-simeth (MAALOX/MYLANTA) 200-200-20 MG/5ML suspension 30 mL  30 mL Oral Q4H PRN Ardis Hughs, NP       benztropine (COGENTIN) tablet 0.5 mg  0.5 mg Oral BID Massengill, Harrold Donath, MD   0.5 mg at 12/23/22 4098   diphenhydrAMINE (BENADRYL) capsule 50 mg  50 mg Oral TID PRN Ardis Hughs, NP   50 mg at 12/18/22 1046   Or   diphenhydrAMINE (BENADRYL) injection 50 mg  50 mg Intramuscular TID PRN Ardis Hughs, NP       divalproex (DEPAKOTE ER) 24 hr tablet 1,000 mg  1,000 mg Oral QHS Massengill, Harrold Donath, MD   1,000 mg at 12/22/22 2033   haloperidol (HALDOL) tablet 10 mg  10 mg Oral Q12H Massengill, Harrold Donath, MD   10 mg at 12/23/22 1191   haloperidol (HALDOL) tablet 5 mg  5 mg Oral TID PRN Ardis Hughs, NP   5 mg at 12/18/22 1046   Or   haloperidol lactate (HALDOL) injection 5 mg  5 mg Intramuscular TID PRN Ardis Hughs, NP       haloperidol decanoate (HALDOL DECANOATE) 100 MG/ML injection 100 mg  100 mg Intramuscular Q30 days Starleen Blue, NP   100 mg at 12/23/22 0831   hydrOXYzine (ATARAX) tablet 25 mg  25 mg Oral TID PRN Ardis Hughs, NP   25 mg at 12/22/22 2034   ibuprofen (ADVIL) tablet 600 mg  600 mg Oral Q6H PRN Bobbitt, Shalon E, NP       lactulose (CHRONULAC) 10 GM/15ML solution 10 g  10 g Oral BID Massengill, Harrold Donath, MD   10 g at 12/23/22 4782   LORazepam (ATIVAN) tablet 2 mg  2 mg  Oral TID PRN Ardis Hughs, NP   2 mg at 12/18/22 1046   Or   LORazepam (ATIVAN) injection 2 mg  2 mg Intramuscular TID PRN Ardis Hughs, NP       magnesium hydroxide (MILK OF MAGNESIA) suspension 30 mL  30 mL Oral Daily PRN Ardis Hughs, NP       metFORMIN (GLUCOPHAGE) tablet 500 mg  500 mg Oral BID WC Ardis Hughs, NP   500 mg at 12/23/22 9562   multivitamin with minerals tablet 1 tablet  1 tablet Oral Daily Ardis Hughs, NP   1 tablet at 12/23/22 1308   propranolol (INDERAL) tablet 10 mg  10 mg Oral Q8H Massengill, Harrold Donath, MD   10 mg at 12/23/22 0622   traZODone (DESYREL) tablet 50 mg  50 mg Oral QHS PRN Ardis Hughs, NP   50 mg at 12/22/22 2033   vitamin D3 (CHOLECALCIFEROL) tablet 1,000 Units  1,000 Units Oral Daily Massengill, Harrold Donath, MD   1,000 Units at 12/23/22 6578   PTA Medications: Medications Prior to Admission  Medication Sig Dispense Refill Last Dose   benztropine (COGENTIN) 0.5 MG tablet Take 0.25 mg by mouth 2 (two) times daily.      Cholecalciferol (VITAMIN D3) 250  MCG (10000 UT) capsule Take 10,000 Units by mouth daily.      diphenhydrAMINE (BENADRYL) 25 MG tablet Take 25 mg by mouth every 6 (six) hours as needed for sleep or allergies.      divalproex (DEPAKOTE ER) 500 MG 24 hr tablet       INVEGA TRINZA 546 MG/1.75ML injection       metFORMIN (GLUCOPHAGE) 500 MG tablet Take 1 tablet (500 mg total) by mouth 2 (two) times daily with a meal. :Medication induced wt management 180 tablet 3    Multiple Vitamins-Minerals (MENS MULTIVITAMIN PO) Take by mouth.      Omega-3 Fatty Acids (FISH OIL) 1000 MG CAPS Take 1,000 mg by mouth daily.      Semaglutide,0.25 or 0.5MG /DOS, (OZEMPIC, 0.25 OR 0.5 MG/DOSE,) 2 MG/3ML SOPN DIAL AND INJECT UNDER THE SKIN 0.5 MG WEEKLY 3 mL 5    triamcinolone cream (KENALOG) 0.1 % Apply 1 Application topically 2 (two) times daily.      venlafaxine XR (EFFEXOR-XR) 37.5 MG 24 hr capsule Take 37.5 mg by mouth daily.        Patient Stressors: Other: Mental issues    Patient Strengths: Physical Health  Supportive family/friends   Treatment Modalities: Medication Management, Group therapy, Case management,  1 to 1 session with clinician, Psychoeducation, Recreational therapy.   Physician Treatment Plan for Primary Diagnosis: Undifferentiated schizophrenia (HCC) Long Term Goal(s): Improvement in symptoms so as ready for discharge   Short Term Goals: Ability to identify and develop effective coping behaviors will improve Ability to maintain clinical measurements within normal limits will improve Compliance with prescribed medications will improve Ability to identify triggers associated with substance abuse/mental health issues will improve Ability to identify changes in lifestyle to reduce recurrence of condition will improve Ability to verbalize feelings will improve Ability to disclose and discuss suicidal ideas Ability to demonstrate self-control will improve  Medication Management: Evaluate patient's response, side effects, and tolerance of medication regimen.  Therapeutic Interventions: 1 to 1 sessions, Unit Group sessions and Medication administration.  Evaluation of Outcomes: Not Progressing  Physician Treatment Plan for Secondary Diagnosis: Principal Problem:   Undifferentiated schizophrenia (HCC) Active Problems:   Paranoid schizophrenia (HCC)  Long Term Goal(s): Improvement in symptoms so as ready for discharge   Short Term Goals: Ability to identify and develop effective coping behaviors will improve Ability to maintain clinical measurements within normal limits will improve Compliance with prescribed medications will improve Ability to identify triggers associated with substance abuse/mental health issues will improve Ability to identify changes in lifestyle to reduce recurrence of condition will improve Ability to verbalize feelings will improve Ability to disclose and discuss  suicidal ideas Ability to demonstrate self-control will improve     Medication Management: Evaluate patient's response, side effects, and tolerance of medication regimen.  Therapeutic Interventions: 1 to 1 sessions, Unit Group sessions and Medication administration.  Evaluation of Outcomes: Not Progressing   RN Treatment Plan for Primary Diagnosis: Undifferentiated schizophrenia (HCC) Long Term Goal(s): Knowledge of disease and therapeutic regimen to maintain health will improve  Short Term Goals: Ability to remain free from injury will improve, Ability to verbalize frustration and anger appropriately will improve, Ability to participate in decision making will improve, Ability to verbalize feelings will improve, Ability to identify and develop effective coping behaviors will improve, and Compliance with prescribed medications will improve  Medication Management: RN will administer medications as ordered by provider, will assess and evaluate patient's response and provide education to patient for  prescribed medication. RN will report any adverse and/or side effects to prescribing provider.  Therapeutic Interventions: 1 on 1 counseling sessions, Psychoeducation, Medication administration, Evaluate responses to treatment, Monitor vital signs and CBGs as ordered, Perform/monitor CIWA, COWS, AIMS and Fall Risk screenings as ordered, Perform wound care treatments as ordered.  Evaluation of Outcomes: Not Progressing   LCSW Treatment Plan for Primary Diagnosis: Undifferentiated schizophrenia (HCC) Long Term Goal(s): Safe transition to appropriate next level of care at discharge, Engage patient in therapeutic group addressing interpersonal concerns.  Short Term Goals: Engage patient in aftercare planning with referrals and resources, Increase social support, Increase emotional regulation, Facilitate acceptance of mental health diagnosis and concerns, Identify triggers associated with mental  health/substance abuse issues, and Increase skills for wellness and recovery  Therapeutic Interventions: Assess for all discharge needs, 1 to 1 time with Social worker, Explore available resources and support systems, Assess for adequacy in community support network, Educate family and significant other(s) on suicide prevention, Complete Psychosocial Assessment, Interpersonal group therapy.  Evaluation of Outcomes: Not Progressing   Progress in Treatment: Attending groups: Yes. Participating in groups: Yes. Taking medication as prescribed: Yes. Toleration medication: Yes. Family/Significant other contact made: Yes, individual(s) contacted:  Mother Patient understands diagnosis: No. Discussing patient identified problems/goals with staff: Yes. Medical problems stabilized or resolved: Yes. Denies suicidal/homicidal ideation: Yes. Issues/concerns per patient self-inventory: No.   New problem(s) identified: No, Describe:  none   New Short Term/Long Term Goal(s):   medication stabilization, elimination of SI thoughts, development of comprehensive mental wellness plan.      Patient Goals: Did not attend   Discharge Plan or Barriers: Patient will be discharged home to stay with mother.  Patient will be discharged with med management and therapy.  Patient has a referral for ACTT services, CST services and a day program for additional support.    Reason for Continuation of Hospitalization: Delusions  Hallucinations Medication stabilization   Estimated Length of Stay: 3-5 days  Last 3 [place] Suicide Severity Risk Score: Flowsheet Row Ad[facility] (Current) [facility] Most recent reading at 12/17/2022  9:51 PM ED from 12/17/2022 in [facility] Most recent reading at 12/17/2022  9:12 AM ED from 11/04/2022 in [facility] Most recent reading at 11/04/2022  8:26 PM  C-SSRS  RISK CATEGORY No Risk Moderate Risk No Risk       Last PHQ 2/9 Scores:    07/24/2022    2:09 PM 06/12/2022    1:57 PM 02/15/2022    1:34 PM  Depression screen PHQ 2/9  Decreased Interest 0 0 0  Down, Depressed, Hopeless 0 0 0  PHQ - 2 Score 0 0 0    Scribe for Treatment Team: Izell Old Eucha, LCSW 12/23/2022 10:12 AM

## 2022-12-23 NOTE — Group Note (Signed)
Date:  12/23/2022 Time:  10:08 AM  Group Topic/Focus:  Goals Group:   The focus of this group is to help patients establish daily goals to achieve during treatment and discuss how the patient can incorporate goal setting into their daily lives to aide in recovery.    Participation Level:  Did Not Attend   Noah Ramsey Noah Ramsey 12/23/2022, 10:08 AM

## 2022-12-23 NOTE — Progress Notes (Signed)
   12/23/22 0600  15 Minute Checks  Location Bedroom  Visual Appearance Calm  Behavior Composed  Sleep (Behavioral Health Patients Only)  Calculate sleep? (Click Yes once per 24 hr at 0600 safety check) Yes  Documented sleep last 24 hours 7

## 2022-12-23 NOTE — B[facility] Group Notes (Signed)
Adult Psychoeducational Group Note  Date:  12/23/2022 Time:  8:39 PM  Group Topic/Focus:  Wrap-Up Group:   The focus of this group is to help patients review their daily goal of treatment and discuss progress on daily workbooks.  Participation Level:  Active  Participation Quality:  Appropriate  Affect:  Appropriate  Cognitive:  Disorganized  Insight: Good  Engagement in Group:  Engaged  Modes of Intervention:  Discussion  Additional Comments: Pt states that he has had a good day and was able to make progress with his doctors. Pt states that he is scheduled to d/c soon and feels ready for it. Pt went to groups and visited his older brother. Pt denied everything  Vevelyn Pat 12/23/2022, 8:39 PM

## 2022-12-23 NOTE — Group Note (Signed)
Recreation Therapy Group Note   Group Topic:Coping Skills  Group Date: 12/23/2022 Start Time: 1010 End Time: 1045 Facilitators: Asa Fath-McCall, LRT,CTRS Location: 500 [address] Dayroom   Goal Area(s) Addresses: Patient will define what a coping skill is. Patient will work to create a list of healthy coping skills beginning with each letter of the alphabet. Patient will successfully identify positive coping skills they can use post d/c.  Patient will acknowledge benefit(s) of using learned coping skills post d/c.   Group Description: Coping A to Z. Patient asked to identify what a coping skill is and when they use them. Patients with Clinical research associate discussed healthy versus unhealthy coping skills. Next patients were given a blank worksheet titled "Coping Skills A-Z". Patients were instructed to come up with at least one positive coping skill per letter of the alphabet. Patients were given 15 minutes to brainstorm before ideas were presented to the large group. Patients and LRT debriefed on the importance of coping skill selection based on situation and back-up plans when a skill tried is not effective. At the end of group, patients were given an handout of alphabetized strategies to keep for future reference.   Affect/Mood: Appropriate   Participation Level: Engaged   Participation Quality: Independent   Behavior: Appropriate   Speech/Thought Process: Focused   Insight: Good   Judgement: Good   Modes of Intervention: Worksheet   Patient Response to Interventions:  Engaged   Education Outcome:  Acknowledges education   Clinical Observations/Individualized Feedback: Pt was quiet but engaged. Pt was appropriate and attentive during group session. Pt identified some coping skills as action movies, breathing, creative writing, drink water, exercise, friends, gaming, impulse control and revenge on trust.      Plan: Continue to engage patient in RT group sessions  2-3x/week.   Tanishi Nault-McCall, LRT,CTRS 12/23/2022 1:11 PM

## 2022-12-23 NOTE — Progress Notes (Signed)
[facility] MD Progress Note  12/23/2022 6:50 AM Noah Ramsey  MRN:  [id number]  Principal Problem: Undifferentiated schizophrenia (HCC) Diagnosis: Principal Problem:   Undifferentiated schizophrenia (HCC) Active Problems:   Paranoid schizophrenia (HCC)  Reason for Admission:  Noah Ramsey is a 27 y.o. male  with a past psychiatric history of schizophrenia. Patient initially admitted to [facility] voluntarily on 8/28 for worsening psychosis and bizarre behaviors.  (admitted on 12/17/2022, total  LOS: 6 days )  Yesterday, the psychiatry team made following recommendations:  -Give Haldol Decanoate 100 mg IM on 9/2 for mood stabilization -Continue Haldol 10 mg po bid for psychosis.  -Continue Depakote ER from 500 mg to 100 mg po Q hs for mood stabilization. -Continue Cogentin 0.5 mg po bid for prevention of eps.  -Continue Hydroxyzine 25 mg po tid prn for anxiety.  -Continue Trazodone 50 mg po QHS prn for insomnia.  -Continue Propranolol 10 mg po Q8H for elevated pulse rate.  -Continue Vit D3 1000 units po for vit D 3 replacement.   Chart review: Overnight Events HR 109 Compliant with all medications PRN tylenol, atarax, trazodone Yesterday ammonia 59, depakote 35 Pending ammonia, depakote level   Pertinent information discussed during bed progression:  Case was discussed in the multidisciplinary team. MAR was reviewed and patient was compliant with medications.    Information Obtained Today During Patient Interview:  On interview, patient is seen sitting on his bed in his room. Patient reports good sleep and increased appetite. Denies SI/HI/visual hallucinations. "I can manage reality from fantasy." Reports has previously gotten messages from YouTube, laptop. Reports he was paranoid when he first got here. Reports feeling more comfortable once interacting with patients here. He reports family were concerned about "[place] and [place]pp[place] Plumber" behavior, that he was researching too much about  schizophrenia. Reports talking to family every day. They are happy with progress but scared that he might hurt himself in the future. Reports prior to admission he stopped taking his medication, feeling more agitated, and "delirious". Reports he started having delusions. Reports trying to throw up medicine from the injection. Reports physical complaints are increased urinary frequency at nighttime for the past 2 years. Denies dysuria. Reports difficulty initiating. Discussed limiting water intake prior to bed. Reports back pain.  Can talk with mom. Reports he currently does not have an ACT team, reports he was previously with [facility]. Reports he discharged himself because he didn't like the doctor. Felt like he needed someone younger with a faster cognition level. Reports he feels like he is having issues with his memory.   There are no auditory hallucinations, visual hallucinations, paranoid ideations, or delusional thought processes.   Past Psychiatric History: Patient reports he was first admitted in a psychiatric hospital in [place], [place]. Says has been admitted in a psychiatric hospitals been admitted to the psychiatric hospital for at least 10-16 times in his life time. His most recent hospitalization was approximately 2 years ago here at [facility]. Says has been admitted to the [facility] in [place], [place]. With similar complaints. He was being followed by Dr. Vance Peper now is with [facility]. Family Psychiatric History: Patient reports, has a brother with mental illness, drug addiction & homeless. Says mental illnesses run his family.  Social History: Live with stepdad, mom, older brother. Family were concerned about him. Social History   Socioeconomic History   Marital status: Single    Spouse name: Not on file   Number of children: Not on file  Years of education: Not on file   Highest education level: Associate degree: occupational, Scientist, product/process development, or vocational program   Occupational History   Not on file  Tobacco Use   Smoking status: Former    Current packs/day: 0.00    Types: Cigarettes    Quit date: 2023    Years since quitting: 1.6   Smokeless tobacco: Never  Vaping Use   Vaping status: Not on file  Substance and Sexual Activity   Alcohol use: Never   Drug use: Never   Sexual activity: Not on file  Other Topics Concern   Not on file  Social History Narrative   Not on file   Social Determinants of Health   Financial Resource Strain: Low Risk  (07/23/2022)   Overall Financial Resource Strain (CARDIA)    Difficulty of Paying Living Expenses: Not hard at all  Food Insecurity: No Food Insecurity (12/17/2022)   Hunger Vital Sign    Worried About Running Out of Food in the Last Year: Never true    Ran Out of Food in the Last Year: Never true  Transportation Needs: No Transportation Needs (12/17/2022)   PRAPARE - Administrator, Civil Service (Medical): No    Lack of Transportation (Non-Medical): No  Physical Activity: Sufficiently Active (07/23/2022)   Exercise Vital Sign    Days of Exercise per Week: 4 days    Minutes of Exercise per Session: 90 min  Stress: Stress Concern Present (07/23/2022)   [employer] of Occupational Health - Occupational Stress Questionnaire    Feeling of Stress : Rather much  Social Connections: Unknown (12/02/2022)   Received from [facility]   Social Network    Social Network: Not on file  Intimate Partner Violence: Not At Risk (12/17/2022)   Humiliation, Afraid, Rape, and Kick questionnaire    Fear of Current or Ex-Partner: No    Emotionally Abused: No    Physically Abused: No    Sexually Abused: No    Past Medical History:  Past Medical History:  Diagnosis Date   Complication of anesthesia    Elevated CPK    per patient   Schizophrenia (HCC)    Family History:  Family History  Problem Relation Age of Onset   Mental illness Brother     Current Medications: Current  Facility-Administered Medications  Medication Dose Route Frequency Provider Last Rate Last Admin   acetaminophen (TYLENOL) tablet 650 mg  650 mg Oral Q6H PRN Ardis Hughs, NP   650 mg at 12/22/22 1657   alum & mag hydroxide-simeth (MAALOX/MYLANTA) 200-200-20 MG/5ML suspension 30 mL  30 mL Oral Q4H PRN Ardis Hughs, NP       benztropine (COGENTIN) tablet 0.5 mg  0.5 mg Oral BID Massengill, Nathan, MD   0.5 mg at 12/22/22 1657   diphenhydrAMINE (BENADRYL) capsule 50 mg  50 mg Oral TID PRN Ardis Hughs, NP   50 mg at 12/18/22 1046   Or   diphenhydrAMINE (BENADRYL) injection 50 mg  50 mg Intramuscular TID PRN Ardis Hughs, NP       divalproex (DEPAKOTE ER) 24 hr tablet 1,000 mg  1,000 mg Oral QHS Massengill, Harrold Donath, MD   1,000 mg at 12/22/22 2033   haloperidol (HALDOL) tablet 10 mg  10 mg Oral Q12H Massengill, Harrold Donath, MD   10 mg at 12/22/22 2033   haloperidol (HALDOL) tablet 5 mg  5 mg Oral TID PRN Ardis Hughs, NP   5 mg at 12/18/22  1046   Or   haloperidol lactate (HALDOL) injection 5 mg  5 mg Intramuscular TID PRN Ardis Hughs, NP       haloperidol decanoate (HALDOL DECANOATE) 100 MG/ML injection 100 mg  100 mg Intramuscular Q30 days Starleen Blue, NP       hydrOXYzine (ATARAX) tablet 25 mg  25 mg Oral TID PRN Ardis Hughs, NP   25 mg at 12/22/22 2034   ibuprofen (ADVIL) tablet 600 mg  600 mg Oral Q6H PRN Bobbitt, Shalon E, NP       LORazepam (ATIVAN) tablet 2 mg  2 mg Oral TID PRN Ardis Hughs, NP   2 mg at 12/18/22 1046   Or   LORazepam (ATIVAN) injection 2 mg  2 mg Intramuscular TID PRN Ardis Hughs, NP       magnesium hydroxide (MILK OF MAGNESIA) suspension 30 mL  30 mL Oral Daily PRN Ardis Hughs, NP       metFORMIN (GLUCOPHAGE) tablet 500 mg  500 mg Oral BID WC Ardis Hughs, NP   500 mg at 12/22/22 1657   multivitamin with minerals tablet 1 tablet  1 tablet Oral Daily Ardis Hughs, NP   1 tablet at 12/22/22 0809    propranolol (INDERAL) tablet 10 mg  10 mg Oral Q8H Massengill, Harrold Donath, MD   10 mg at 12/23/22 0622   traZODone (DESYREL) tablet 50 mg  50 mg Oral QHS PRN Ardis Hughs, NP   50 mg at 12/22/22 2033   vitamin D3 (CHOLECALCIFEROL) tablet 1,000 Units  1,000 Units Oral Daily Massengill, Harrold Donath, MD   1,000 Units at 12/22/22 0809    Lab Results:  Results for orders placed or performed during the hospital encounter of 12/17/22 (from the past 48 hour(s))  Hepatic function panel     Status: None   Collection Time: 12/22/22  6:28 PM  Result Value Ref Range   Total Protein 8.0 6.5 - 8.1 g/dL   Albumin 3.8 3.5 - 5.0 g/dL   AST 41 15 - 41 U/L   ALT 30 0 - 44 U/L   Alkaline Phosphatase 64 38 - 126 U/L   Total Bilirubin 0.5 0.3 - 1.2 mg/dL   Bilirubin, Direct <1.6 0.0 - 0.2 mg/dL   Indirect Bilirubin NOT CALCULATED 0.3 - 0.9 mg/dL    Comment: Performed at [facility], [address], [place], [place] [address]  Valproic acid level     Status: Abnormal   Collection Time: 12/22/22  6:28 PM  Result Value Ref Range   Valproic Acid Lvl 35 (L) 50.0 - 100.0 ug/mL    Comment: Performed at [facility], [address] [place], [place] [address]  Ammonia     Status: Abnormal   Collection Time: 12/22/22  6:28 PM  Result Value Ref Range   Ammonia 59 (H) 9 - 35 umol/L    Comment: HEMOLYSIS AT THIS LEVEL MAY AFFECT RESULT Performed at [facility], [address] [place], [place] [address]     Blood Alcohol level:  Lab Results  Component Value Date   [facility] <10 12/17/2022   ETH <10 08/28/2021    Metabolic Labs: Lab Results  Component Value Date   HGBA1C 6.1 (H) 12/17/2022   MPG 128.37 12/17/2022   MPG 111.15 12/04/2020   No results found for: "PROLACTIN" Lab Results  Component Value Date   CHOL 154 12/17/2022   TRIG 70 12/17/2022   HDL  40 (L) 12/17/2022   CHOLHDL 3.9 12/17/2022   VLDL 14 12/17/2022   LDLCALC 100 (H) 12/17/2022    LDLCALC 93 11/06/2021    Sleep:Sleep: Good   Physical Findings: AIMS:  Facial and Oral Movements Muscles of Facial Expression: None, normal Lips and Perioral Area: None, normal Jaw: None, normal Tongue: None, normal,Extremity Movements Upper (arms, wrists, hands, fingers): None, normal Lower (legs, knees, ankles, toes): None, normal Trunk Movements: None, normal  Neck, shoulders, hips: None, normal Overall Severity Severity of abnormal movements (highest score from questions above): None, normal Incapacitation due to abnormal movements: None, normal Patient's awareness of abnormal movements (rate only patient's report): No Awareness, Dental Status Current problems with teeth and/or dentures?: No Does patient usually wear dentures?: No   No stiffness, cogwheeling, or tremors noted on exam.   CIWA:    COWS:     Psychiatric Specialty Exam:  Presentation  General Appearance: Appropriate for Environment; Casual  Eye Contact:Good  Speech:Clear and Coherent  Speech Volume:Normal  Handedness:Right   Mood and Affect  Mood:Depressed  Affect:Congruent   Thought Process  Thought Processes:Coherent  Descriptions of Associations:Intact  Orientation:Full (Time, Place and Person)  Thought Content:Logical  History of Schizophrenia/Schizoaffective disorder:Yes  Duration of Psychotic Symptoms:Greater than six months  Hallucinations:Hallucinations: None  Ideas of Reference:None  Suicidal Thoughts:Suicidal Thoughts: No  Homicidal Thoughts:Homicidal Thoughts: No   Sensorium  Memory:Immediate Good  Judgment:Fair  Insight:Fair   Executive Functions  Concentration:Fair  Attention Span:Good  Recall:Good  Fund of Knowledge:Fair  Language:Fair   Psychomotor Activity  Psychomotor Activity:Psychomotor Activity: Normal   Assets  Assets:Resilience   Sleep  Sleep:Sleep: Good    Physical Exam: Physical Exam Constitutional:      Appearance: He  is obese.  HENT:     Head: Normocephalic and atraumatic.     Mouth/Throat:     Mouth: Mucous membranes are moist.  Cardiovascular:     Rate and Rhythm: Normal rate.  Pulmonary:     Effort: Pulmonary effort is normal.  Musculoskeletal:        General: Normal range of motion.     Cervical back: Normal range of motion.  Skin:    General: Skin is warm and dry.  Neurological:     General: No focal deficit present.     Mental Status: He is alert.  Psychiatric:        Mood and Affect: Affect is blunt.        Speech: Speech normal.        Behavior: Behavior is cooperative.        Judgment: Judgment normal.    Review of Systems  Constitutional: Negative.   HENT: Negative.    Eyes: Negative.   Respiratory: Negative.    Cardiovascular: Negative.   Gastrointestinal: Negative.   Genitourinary:  Positive for frequency.  Musculoskeletal:  Positive for back pain.  Skin: Negative.   Neurological: Negative.   Endo/Heme/Allergies: Negative.    Blood pressure 129/81, pulse (!) 109, temperature 98.2 F (36.8 C), temperature source Oral, resp. rate 20, height 5\' 7"  (1.702 m), weight (!) 167.4 kg, SpO2 94%. Body mass index is 57.79 kg/m.  Treatment Plan Summary: Daily contact with patient to assess and evaluate symptoms and progress in treatment, Medication management, and Plan     ASSESSMENT: Patient appears to continue to improve, he appears to have more insight into what brought him to the hospital. He received haldol 100mg  LAI today. Given he is currently on total oral haldol 20mg , he will  require haldol LAI 200mg  for maintenance dosing, he can receive additional haldol 100mg  on Thursday (3 days after initial dose). Will start lactulose given hyperammonemia. Will recheck depakote and ammonia level tomorrow.   Diagnoses / Active Problems: Paranoid schizophrenia (HCC)  PLAN: Safety and Monitoring:  --  VOLUNTARY admission to inpatient psychiatric unit for safety, stabilization and  treatment  -- Daily contact with patient to assess and evaluate symptoms and progress in treatment  -- Patient's case to be discussed in multi-disciplinary team meeting  -- Observation Level : q15 minute checks  -- Vital signs:  q12 hours  -- Precautions: suicide, elopement, and assault  2. Psychiatric Diagnoses and Treatment:  -Continue Haldol 10 mg po bid for psychosis, will need to gradually taper oral dose starting 10/2   -Give additional injection of haldol deconoate 100mg  IM on 9/5 -Continue Depakote ER 1000 mg po QHS for mood stabilization.   9/1: Depakote level 35 -Continue Cogentin 0.5 mg po bid for prevention of eps.  -Continue Hydroxyzine 25 mg po tid prn for anxiety.  -Continue Trazodone 50 mg po Q hs prn for insomnia.  -Continue Propranolol 10 mg po bid for elevated pulse rate.  -Continue Vit D3 1000 units po for vit D 3 replacement.   -Received Haldol Decanoate 100 mg IM on 9/2 for mood stabilization (next dose IM Haldol Decanoate 200mg  due 01/20/23)  -- The risks/benefits/side-effects/alternatives to this medication were discussed in detail with the patient and time was given for questions. The patient consents to medication trial.              -- Metabolic profile and EKG monitoring obtained while on an atypical antipsychotic  BMI: 57.79 TSH: 4.096 Lipid Panel: LDL 100 HbgA1c: 6.1 QTc: 457             -- Encouraged patient to participate in unit milieu and in scheduled group therapies   -- Short Term Goals: Ability to identify changes in lifestyle to reduce recurrence of condition will improve, Ability to verbalize feelings will improve, Ability to disclose and discuss suicidal ideas, Ability to demonstrate self-control will improve, and Compliance with prescribed medications will improve  -- Long Term Goals: Improvement in symptoms so as ready for discharge Other PRNS:  Agitation protocols: Cont as recommended;  -Benadryl 50 mg po or IM tid prn. -Haldol 5 mg po or IM  tid prn.  -Lorazepam 2 mg po or IM tid prn.   Other medical concerns being addressed.  -Continue metformin 500 mg po bid for DM management.    Other PRNS -Continue Tylenol 650 mg every 6 hours PRN for mild pain -Continue Maalox 30 ml Q 4 hrs PRN for indigestion -Continue MOM 30 ml po Q 6 hrs for constipation   3. Medical Issues Being Addressed:   #Elevated ammonia Start lactulose 10g BID   4. Discharge Planning:   -- Social work and case management to assist with discharge planning and identification of hospital follow-up needs prior to discharge. Discussed with social work regarding ACT team referral. SW will talk with mom tomorrow and f/u on ACT team.  -- Estimated LOS: Possible discharge Wednesday  -- Discharge Concerns: Need to establish a safety plan; Medication compliance and effectiveness  -- Discharge Goals: Return home with outpatient referrals for mental health follow-up including medication management/psychotherapy   I certify that inpatient services furnished can reasonably be expected to improve the patient's condition.   This note was created using a voice recognition software as a result  there may be grammatical errors inadvertently enclosed that do not reflect the nature of this encounter. Every attempt is made to correct such errors.   Dr. Karie Fetch, MD PGY-2, Psychiatry Residency  9/2/20246:50 AM

## 2022-12-24 DIAGNOSIS — F200 Paranoid schizophrenia: Secondary | ICD-10-CM | POA: Diagnosis not present

## 2022-12-24 LAB — VALPROIC ACID LEVEL: Valproic Acid Lvl: 55 ug/mL (ref 50.0–100.0)

## 2022-12-24 LAB — AMMONIA: Ammonia: 49 umol/L — ABNORMAL HIGH (ref 9–35)

## 2022-12-24 MED ORDER — LACTULOSE 10 GM/15ML PO SOLN
10.0000 g | ORAL | Status: AC
  Administered 2022-12-24: 10 g via ORAL
  Filled 2022-12-24: qty 15

## 2022-12-24 MED ORDER — LACTULOSE ENCEPHALOPATHY 10 GM/15ML PO SOLN
20.0000 g | Freq: Two times a day (BID) | ORAL | Status: DC
  Administered 2022-12-24 – 2022-12-26 (×4): 20 g via ORAL
  Filled 2022-12-24: qty 30
  Filled 2022-12-24 (×2): qty 180
  Filled 2022-12-24 (×5): qty 30

## 2022-12-24 NOTE — Plan of Care (Signed)
  Problem: Education: Goal: Emotional status will improve Outcome: Progressing Goal: Mental status will improve Outcome: Progressing   Problem: Activity: Goal: Interest or engagement in activities will improve Outcome: Progressing Goal: Sleeping patterns will improve Outcome: Progressing   Problem: Coping: Goal: Ability to demonstrate self-control will improve Outcome: Progressing   

## 2022-12-24 NOTE — Progress Notes (Signed)
   12/24/22 0900  Psych Admission Type (Psych Patients Only)  Admission Status Voluntary  Psychosocial Assessment  Patient Complaints Worrying  Eye Contact Fair  Facial Expression Flat  Affect Preoccupied  Speech Soft;Slow  Interaction Assertive  Motor Activity Slow  Appearance/Hygiene Unremarkable  Behavior Characteristics Cooperative  Mood Preoccupied;Suspicious  Thought Process  Coherency Circumstantial  Content Preoccupation  Delusions Paranoid  Perception WDL  Hallucination None reported or observed  Judgment Impaired  Confusion None  Danger to Self  Current suicidal ideation? Denies  Danger to Others  Danger to Others None reported or observed

## 2022-12-24 NOTE — Group Note (Signed)
Date:  12/24/2022 Time:  8:55 PM  Group Topic/Focus:  Wrap-Up Group:   The focus of this group is to help patients review their daily goal of treatment and discuss progress on daily workbooks.    Participation Level:  Active  Participation Quality:  Appropriate  Affect:  Appropriate  Cognitive:  Appropriate  Insight: Appropriate  Engagement in Group:  Developing/Improving  Modes of Intervention:  Discussion  Additional Comments:  Pt stated his goal for today was to focus on his treatment plan and discuss his discharge plan with his doctor. Pt stated he accomplished his goal today. Pt stated he did not get a chance to  talked with his doctor or with his social worker about his care today. Pt rated his overall day a 10. Pt stated the plan is for him to discharge on 12/26/2022.  Pt stated he was able to contact his mother today which improved his overall day. Pt stated he felt better about himself today. Pt stated he was able to attend all meals. Pt stated he took all medications provided today. Pt stated he attend all groups held today. Pt stated his appetite was pretty good today. Pt rated sleep last night was pretty good. Pt stated the goal tonight was to get some rest. Pt stated he had no physical pain tonight. Pt deny visual hallucinations and auditory issues tonight. Pt denies thoughts of harming himself or others. Pt stated he would alert staff if anything changed  [name] 12/24/2022, 8:55 PM

## 2022-12-24 NOTE — Group Note (Signed)
Recreation Therapy Group Note   Group Topic:Other  Group Date: 12/24/2022 Start Time: 1015 End Time: 1120 Facilitators: Genova Kiner-McCall, LRT,CTRS Location: [address] Dayroom   Goal Area(s) Addresses:  Patient will successfully answer the questions presented to them. Patient will successfully identify the benefits of expressing themselves. Patient will successfully engage with peers.    Group Description: LRT facilitated a discussion game that gave patients the opportunity to share more about themselves. The activity is played like Jenga, the only difference in the blocks are painted in the colors orange, red, green and blue. Which ever color the patient picks, they would be asked a question that deals with ice breakers. Patients would take turns until the tower fell over.    Education:  Self Expression, Discharge Planning   Education Outcome: Acknowledges education   Affect/Mood: Appropriate   Participation Level: Engaged   Participation Quality: Independent   Behavior: Appropriate   Speech/Thought Process: Relevant   Insight: Moderate   Judgement: Moderate   Modes of Intervention: Activity   Patient Response to Interventions:  Engaged   Education Outcome:  In group clarification offered    Clinical Observations/Individualized Feedback: Pt was appropriate and focused during group. Pt was able to stay engaged with the activity. Pt gave some decent and intriguing answers to the questions he was asked in group. 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     Plan: Continue to engage patient in RT group  sessions 2-3x/week.   Tabytha Gradillas-McCall, LRT,CTRS 12/24/2022 1:30 PM

## 2022-12-24 NOTE — Care Management Important Message (Signed)
Medicare IM printed and given to social work to give to the patient. ?

## 2022-12-24 NOTE — Progress Notes (Signed)
   12/24/22 2115  Psych Admission Type (Psych Patients Only)  Admission Status Voluntary  Psychosocial Assessment  Patient Complaints Worrying  Eye Contact Fair  Facial Expression Flat  Affect Preoccupied  Speech Soft;Slow  Interaction Attention-seeking  Motor Activity Slow  Appearance/Hygiene Unremarkable  Behavior Characteristics Cooperative  Mood Suspicious;Preoccupied  Aggressive Behavior  Effect No apparent injury  Thought Process  Coherency Circumstantial  Content Blaming others;Preoccupation  Delusions Paranoid  Perception Hallucinations  Hallucination Auditory  Judgment Impaired  Confusion WDL  Danger to Self  Current suicidal ideation? Denies  Danger to Others  Danger to Others None reported or observed

## 2022-12-24 NOTE — Progress Notes (Signed)
[facility] MD Progress Note  12/24/2022 8:45 AM Noah Ramsey  MRN:  [id number]  Principal Problem: Paranoid schizophrenia (HCC) Diagnosis: Principal Problem:   Paranoid schizophrenia (HCC)  Reason for Admission:  Noah Ramsey is a 27 y.o. male  with a past psychiatric history of schizophrenia. Patient initially admitted to [facility] voluntarily on 8/28 for worsening psychosis and bizarre behaviors.  (admitted on 12/17/2022, total  LOS: 7 days )  Yesterday, the psychiatry team made following recommendations:  --Continue Haldol 10 mg po bid for psychosis, will need to gradually taper oral dose starting 10/2   -Give additional injection of haldol deconoate 100mg  IM on 9/5 -Continue Depakote ER 1000 mg po QHS for mood stabilization.   9/1: Depakote level 35 -Continue Cogentin 0.5 mg po bid for prevention of eps.  -Continue Hydroxyzine 25 mg po tid prn for anxiety.  -Continue Trazodone 50 mg po Q hs prn for insomnia.  -Continue Propranolol 10 mg po bid for elevated pulse rate.  -Continue Vit D3 1000 units po for vit D 3 replacement.    -Received Haldol Decanoate 100 mg IM on 9/2 for mood stabilization (next dose IM Haldol Decanoate 200mg  due 01/20/23)  Chart review: Overnight Events HR 90. BP 144/87. Compliant with all medications. PRN atarax, trazodone Prior ammonia 59, depakote 35. Repeat ammonia 49, depakote level 55  Pertinent information discussed during bed progression:  Case was discussed in the multidisciplinary team. Slept 7.5 hours. Not as paranoid. Does not hear or see anything.   Information Obtained Today During Patient Interview:  Reports sleep was a lot better. Slept from 11pm-5-6am. Feels like mood is more upbeat. Feel like he is ready to get to next step of recovery journey since "relapse" 8 days ago. He reports the "relapse" is a relapse in his mental illness. Feel like in the residual phase of the schizophrenia. Don't feel like he has any active psychotic symptoms but the  negative symptoms are still there. Reports he did not want to take medications, normally doesn't have thoughts of SI. Reports if having thoughts of hurting self, he likes to talk to people. Reports speaking to therapist to gain solace. Reports occasional passive SI, come in and come out, attributes it to being bored. Reports combats thoughts with video gaming. 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 Reports intermittent HI but will not act on it. Even though I have this condition I can still go to jail but I'm not trying to go to jail. Reports seeing shapes and patterns, attributes to his astigmatism. Reports shadows on admission but they went away. Reports took a while for the compulsions to go away - excessively touching genital area, sexual thoughts, self-deprecating thoughts. Reports occurring less frequently now compared to admission. Reports some right sided back pain. Discussed that he has tylenol. Feeling low energy, anhedonia, feeling a little too calm. Feeling lack of appetite.    Past Psychiatric History: Patient reports he was first admitted in a psychiatric hospital in [place], [place]. Says has been admitted in a psychiatric hospitals been admitted to the psychiatric hospital for at least 10-16 times in his life time. His most recent hospitalization was approximately 2 years ago here at [facility]. Says has been admitted to the [facility] in [place], [place]. With similar complaints. He was being followed by Dr. Vance Peper now is with [facility]. Family Psychiatric History: Patient reports, has a brother with mental illness, drug addiction & homeless. Says mental illnesses run his family.  Social History: Live with stepdad,  mom, older brother. Family were concerned about him. Social History   Socioeconomic History   Marital status: Single    Spouse name: Not on file   Number of children: Not on file   Years of education: Not on file   Highest education level: Associate degree:  occupational, Scientist, product/process development, or vocational program  Occupational History   Not on file  Tobacco Use   Smoking status: Former    Current packs/day: 0.00    Types: Cigarettes    Quit date: 2023    Years since quitting: 1.6   Smokeless tobacco: Never  Vaping Use   Vaping status: Not on file  Substance and Sexual Activity   Alcohol use: Never   Drug use: Never   Sexual activity: Not on file  Other Topics Concern   Not on file  Social History Narrative   Not on file   Social Determinants of Health   Financial Resource Strain: Low Risk  (07/23/2022)   Overall Financial Resource Strain (CARDIA)    Difficulty of Paying Living Expenses: Not hard at all  Food Insecurity: No Food Insecurity (12/17/2022)   Hunger Vital Sign    Worried About Running Out of Food in the Last Year: Never true    Ran Out of Food in the Last Year: Never true  Transportation Needs: No Transportation Needs (12/17/2022)   PRAPARE - Administrator, Civil Service (Medical): No    Lack of Transportation (Non-Medical): No  Physical Activity: Sufficiently Active (07/23/2022)   Exercise Vital Sign    Days of Exercise per Week: 4 days    Minutes of Exercise per Session: 90 min  Stress: Stress Concern Present (07/23/2022)   [employer] of Occupational Health - Occupational Stress Questionnaire    Feeling of Stress : Rather much  Social Connections: Unknown (12/02/2022)   Received from [facility]   Social Network    Social Network: Not on file  Intimate Partner Violence: Not At Risk (12/17/2022)   Humiliation, Afraid, Rape, and Kick questionnaire    Fear of Current or Ex-Partner: No    Emotionally Abused: No    Physically Abused: No    Sexually Abused: No    Past Medical History:  Past Medical History:  Diagnosis Date   Complication of anesthesia    Elevated CPK    per patient   Schizophrenia (HCC)    Family History:  Family History  Problem Relation Age of Onset   Mental illness Brother      Current Medications: Current Facility-Administered Medications  Medication Dose Route Frequency Provider Last Rate Last Admin   acetaminophen (TYLENOL) tablet 650 mg  650 mg Oral Q6H PRN Ardis Hughs, NP   650 mg at 12/22/22 1657   alum & mag hydroxide-simeth (MAALOX/MYLANTA) 200-200-20 MG/5ML suspension 30 mL  30 mL Oral Q4H PRN Ardis Hughs, NP       benztropine (COGENTIN) tablet 0.5 mg  0.5 mg Oral BID Massengill, Nathan, MD   0.5 mg at 12/24/22 0816   diphenhydrAMINE (BENADRYL) capsule 50 mg  50 mg Oral TID PRN Ardis Hughs, NP   50 mg at 12/18/22 1046   Or   diphenhydrAMINE (BENADRYL) injection 50 mg  50 mg Intramuscular TID PRN Ardis Hughs, NP       divalproex (DEPAKOTE ER) 24 hr tablet 1,000 mg  1,000 mg Oral QHS Massengill, Harrold Donath, MD   1,000 mg at 12/23/22 2052   haloperidol (HALDOL) tablet 10 mg  10 mg Oral Q12H Massengill, Harrold Donath, MD   10 mg at 12/24/22 2130   haloperidol (HALDOL) tablet 5 mg  5 mg Oral TID PRN Ardis Hughs, NP   5 mg at 12/18/22 1046   Or   haloperidol lactate (HALDOL) injection 5 mg  5 mg Intramuscular TID PRN Ardis Hughs, NP       haloperidol decanoate (HALDOL DECANOATE) 100 MG/ML injection 100 mg  100 mg Intramuscular Q30 days Starleen Blue, NP   100 mg at 12/23/22 0831   [START ON 12/26/2022] haloperidol decanoate (HALDOL DECANOATE) 100 MG/ML injection 100 mg  100 mg Intramuscular Once Karie Fetch, MD       hydrOXYzine (ATARAX) tablet 25 mg  25 mg Oral TID PRN Ardis Hughs, NP   25 mg at 12/23/22 2052   ibuprofen (ADVIL) tablet 600 mg  600 mg Oral Q6H PRN Bobbitt, Shalon E, NP       lactulose (CHRONULAC) 10 GM/15ML solution 10 g  10 g Oral NOW Massengill, Harrold Donath, MD       lactulose (CHRONULAC) 10 GM/15ML solution 20 g  20 g Oral BID Massengill, Harrold Donath, MD       LORazepam (ATIVAN) tablet 2 mg  2 mg Oral TID PRN Ardis Hughs, NP   2 mg at 12/18/22 1046   Or   LORazepam (ATIVAN) injection 2 mg  2 mg  Intramuscular TID PRN Ardis Hughs, NP       magnesium hydroxide (MILK OF MAGNESIA) suspension 30 mL  30 mL Oral Daily PRN Ardis Hughs, NP       metFORMIN (GLUCOPHAGE) tablet 500 mg  500 mg Oral BID WC Ardis Hughs, NP   500 mg at 12/24/22 0816   multivitamin with minerals tablet 1 tablet  1 tablet Oral Daily Ardis Hughs, NP   1 tablet at 12/24/22 0816   propranolol (INDERAL) tablet 10 mg  10 mg Oral Q8H Massengill, Harrold Donath, MD   10 mg at 12/24/22 8657   traZODone (DESYREL) tablet 50 mg  50 mg Oral QHS PRN Ardis Hughs, NP   50 mg at 12/23/22 2052   vitamin D3 (CHOLECALCIFEROL) tablet 1,000 Units  1,000 Units Oral Daily Massengill, Harrold Donath, MD   1,000 Units at 12/24/22 8469    Lab Results:  Results for orders placed or performed during the hospital encounter of 12/17/22 (from the past 48 hour(s))  Hepatic function panel     Status: None   Collection Time: 12/22/22  6:28 PM  Result Value Ref Range   Total Protein 8.0 6.5 - 8.1 g/dL   Albumin 3.8 3.5 - 5.0 g/dL   AST 41 15 - 41 U/L   ALT 30 0 - 44 U/L   Alkaline Phosphatase 64 38 - 126 U/L   Total Bilirubin 0.5 0.3 - 1.2 mg/dL   Bilirubin, Direct <6.2 0.0 - 0.2 mg/dL   Indirect Bilirubin NOT CALCULATED 0.3 - 0.9 mg/dL    Comment: Performed at [facility], [address], [place], [place] [address]  Valproic acid level     Status: Abnormal   Collection Time: 12/22/22  6:28 PM  Result Value Ref Range   Valproic Acid Lvl 35 (L) 50.0 - 100.0 ug/mL    Comment: Performed at [facility], [address] [place], [place] [address]  Ammonia     Status: Abnormal   Collection Time: 12/22/22  6:28 PM  Result Value Ref Range  Ammonia 59 (H) 9 - 35 umol/L    Comment: HEMOLYSIS AT THIS LEVEL MAY AFFECT RESULT Performed at [facility], [address] [place], [place] [address]   Valproic acid level     Status: None   Collection Time: 12/24/22  6:24 AM   Result Value Ref Range   Valproic Acid Lvl 55 50.0 - 100.0 ug/mL    Comment: Performed at [facility], [address] [place], [place] [address]  Ammonia     Status: Abnormal   Collection Time: 12/24/22  6:24 AM  Result Value Ref Range   Ammonia 49 (H) 9 - 35 umol/L    Comment: Performed at [facility], [address] [place], [place] [address]    Blood Alcohol level:  Lab Results  Component Value Date   [facility] <10 12/17/2022   ETH <10 08/28/2021    Metabolic Labs: Lab Results  Component Value Date   HGBA1C 6.1 (H) 12/17/2022   MPG 128.37 12/17/2022   MPG 111.15 12/04/2020   No results found for: "PROLACTIN" Lab Results  Component Value Date   CHOL 154 12/17/2022   TRIG 70 12/17/2022   HDL 40 (L) 12/17/2022   CHOLHDL 3.9 12/17/2022   VLDL 14 12/17/2022   LDLCALC 100 (H) 12/17/2022   LDLCALC 93 11/06/2021    Sleep:Sleep: Good   Physical Findings: AIMS:  Facial and Oral Movements Muscles of Facial Expression: None, normal Lips and Perioral Area: None, normal Jaw: None, normal Tongue: None, normal,Extremity Movements Upper (arms, wrists, hands, fingers): None, normal Lower (legs, knees, ankles, toes): None, normal Trunk Movements: None, normal  Neck, shoulders, hips: None, normal Overall Severity Severity of abnormal movements (highest score from questions above): None, normal Incapacitation due to abnormal movements: None, normal Patient's awareness of abnormal movements (rate only patient's report): No Awareness, Dental Status Current problems with teeth and/or dentures?: No Does patient usually wear dentures?: No   No stiffness, cogwheeling, or tremors noted on exam.   CIWA:    COWS:     Psychiatric Specialty Exam:  Presentation  General Appearance: Appropriate for Environment  Eye Contact:Good  Speech:Clear and Coherent  Speech Volume:Normal  Handedness:Right   Mood and Affect   Mood:Euthymic  Affect:Congruent   Thought Process  Thought Processes:Coherent  Descriptions of Associations:Intact  Orientation:Full (Time, Place and Person)  Thought Content:Logical  History of Schizophrenia/Schizoaffective disorder:Yes  Duration of Psychotic Symptoms:Greater than six months  Hallucinations:Hallucinations: None  Ideas of Reference:None  Suicidal Thoughts:Suicidal Thoughts: No  Homicidal Thoughts:Homicidal Thoughts: No   Sensorium  Memory:Recent Good; Remote Good  Judgment:Fair  Insight:Fair   Executive Functions  Concentration:Fair  Attention Span:Fair  Recall:Fair  Fund of Knowledge:Fair  Language:Fair   Psychomotor Activity  Psychomotor Activity:Psychomotor Activity: Normal   Assets  Assets:Social Support; Housing; Resilience   Sleep  Sleep:Sleep: Good    Physical Exam: Physical Exam Constitutional:      Appearance: He is obese.  HENT:     Head: Normocephalic and atraumatic.     Mouth/Throat:     Mouth: Mucous membranes are moist.  Cardiovascular:     Rate and Rhythm: Normal rate.  Pulmonary:     Effort: Pulmonary effort is normal.  Musculoskeletal:        General: Normal range of motion.     Cervical back: Normal range of motion.  Skin:    General: Skin is warm and dry.  Neurological:     General: No focal deficit present.  Mental Status: He is alert.  Psychiatric:        Mood and Affect: Affect is blunt.        Speech: Speech normal.        Behavior: Behavior is cooperative.        Judgment: Judgment normal.    Review of Systems  Constitutional: Negative.   HENT: Negative.    Eyes: Negative.   Respiratory: Negative.    Cardiovascular: Negative.   Gastrointestinal: Negative.   Genitourinary:  Positive for frequency.  Musculoskeletal:  Positive for back pain.  Skin: Negative.   Neurological: Negative.   Endo/Heme/Allergies: Negative.    Blood pressure (!) 144/87, pulse 90, temperature 98.1 F  (36.7 C), temperature source Oral, resp. rate 20, height 5\' 7"  (1.702 m), weight (!) 167.4 kg, SpO2 93%. Body mass index is 57.79 kg/m.  Treatment Plan Summary: Daily contact with patient to assess and evaluate symptoms and progress in treatment, Medication management, and Plan     ASSESSMENT: Patient appears to be continue to improve, he appears to have more insight into what brought him to the hospital. He received haldol 100mg  LAI 9/2 and does not currently report any side effects. No EPS noted on exam. Given he is currently on total oral haldol 20mg , he will require haldol LAI 200mg  for maintenance dosing, he can receive additional haldol 100mg  on Thursday (3 days after initial dose). Will increase lactulose given hyperammonemia. Will recheck ammonia level again tomorrow. Depakote level therapeutic now. SW working on getting him reconnected with ACT team.   Diagnoses / Active Problems: Paranoid schizophrenia (HCC)  PLAN: Safety and Monitoring:  --  VOLUNTARY admission to inpatient psychiatric unit for safety, stabilization and treatment  -- Daily contact with patient to assess and evaluate symptoms and progress in treatment  -- Patient's case to be discussed in multi-disciplinary team meeting  -- Observation Level : q15 minute checks  -- Vital signs:  q12 hours  -- Precautions: suicide, elopement, and assault  2. Psychiatric Diagnoses and Treatment:  -Continue Haldol 10 mg po bid for psychosis, will need to gradually taper oral dose starting 10/2   -Give additional injection of haldol deconoate 100mg  IM on 9/5 -Continue Depakote ER 1000 mg po QHS for mood stabilization.   9/1: Depakote level 35 -Continue Cogentin 0.5 mg po bid for prevention of eps.  -Continue Hydroxyzine 25 mg po tid prn for anxiety.  -Continue Trazodone 50 mg po Q hs prn for insomnia.  -Continue Propranolol 10 mg po bid for elevated pulse rate.  -Continue Vit D3 1000 units po for vit D 3 replacement.    -Received Haldol Decanoate 100 mg IM on 9/2 for mood stabilization (next dose IM Haldol Decanoate 200mg  due 01/20/23)  -- The risks/benefits/side-effects/alternatives to this medication were discussed in detail with the patient and time was given for questions. The patient consents to medication trial.              -- Metabolic profile and EKG monitoring obtained while on an atypical antipsychotic  BMI: 57.79 TSH: 4.096 Lipid Panel: LDL 100 HbgA1c: 6.1 QTc: 457             -- Encouraged patient to participate in unit milieu and in scheduled group therapies   -- Short Term Goals: Ability to identify changes in lifestyle to reduce recurrence of condition will improve, Ability to verbalize feelings will improve, Ability to disclose and discuss suicidal ideas, Ability to demonstrate self-control will improve, and Compliance with prescribed  medications will improve  -- Long Term Goals: Improvement in symptoms so as ready for discharge Other PRNS:  Agitation protocols: Cont as recommended;  -Benadryl 50 mg po or IM tid prn. -Haldol 5 mg po or IM tid prn.  -Lorazepam 2 mg po or IM tid prn.   Other medical concerns being addressed.  -Continue metformin 500 mg po bid for DM management.    Other PRNS -Continue Tylenol 650 mg every 6 hours PRN for mild pain -Continue Maalox 30 ml Q 4 hrs PRN for indigestion -Continue MOM 30 ml po Q 6 hrs for constipation   3. Medical Issues Being Addressed:   #Elevated ammonia -Ammonia level 59 -> 49 Increase lactulose 20g BID   4. Discharge Planning:   -- Social work and case management to assist with discharge planning and identification of hospital follow-up needs prior to discharge. Discussed with social work regarding ACT team referral. SW will talk with mom tomorrow and f/u on ACT team.  -- Estimated LOS: Possible discharge Wednesday  -- Discharge Concerns: Need to establish a safety plan; Medication compliance and effectiveness  -- Discharge Goals:  Return home with outpatient referrals for mental health follow-up including medication management/psychotherapy   I certify that inpatient services furnished can reasonably be expected to improve the patient's condition.   This note was created using a voice recognition software as a result there may be grammatical errors inadvertently enclosed that do not reflect the nature of this encounter. Every attempt is made to correct such errors.   Dr. Karie Fetch, MD PGY-2, Psychiatry Residency  9/3/20248:45 AM

## 2022-12-24 NOTE — Plan of Care (Signed)

## 2022-12-25 DIAGNOSIS — F200 Paranoid schizophrenia: Secondary | ICD-10-CM | POA: Diagnosis not present

## 2022-12-25 LAB — AMMONIA: Ammonia: 46 umol/L — ABNORMAL HIGH (ref 9–35)

## 2022-12-25 MED ORDER — ONDANSETRON 4 MG PO TBDP: 4.0000 mg | ORAL_TABLET | Freq: Three times a day (TID) | ORAL | Status: DC | PRN

## 2022-12-25 MED ORDER — DICLOFENAC SODIUM 1 % EX GEL
2.0000 g | Freq: Four times a day (QID) | CUTANEOUS | Status: DC | PRN
  Administered 2022-12-25: 2 g via TOPICAL
  Filled 2022-12-25: qty 100

## 2022-12-25 NOTE — Group Note (Signed)
Date:  12/25/2022 Time:  1:42 PM  Group Topic/Focus:  Emotional Education:   The focus of this group is to discuss what feelings/emotions are, and how they are experienced.    Participation Level:  Active  Participation Quality:  Appropriate  Affect:  Appropriate  Cognitive:  Appropriate  Insight: Appropriate  Engagement in Group:  Engaged  Modes of Intervention:  Discussion  Additional Comments:  Pt was able to express the ways he copes and his plans for dealing with issues once he leaves.   Noah Ramsey 12/25/2022, 1:42 PM

## 2022-12-25 NOTE — Progress Notes (Signed)
   12/25/22 2145  Psych Admission Type (Psych Patients Only)  Admission Status Voluntary  Psychosocial Assessment  Patient Complaints Worrying  Eye Contact Fair  Facial Expression Flat  Affect Preoccupied  Speech Soft;Slow  Interaction Attention-seeking  Motor Activity Slow  Appearance/Hygiene Unremarkable  Behavior Characteristics Cooperative  Mood Suspicious;Preoccupied  Aggressive Behavior  Effect No apparent injury  Thought Process  Coherency Circumstantial  Content Blaming others;Preoccupation  Delusions Paranoid  Perception Hallucinations  Hallucination Auditory  Judgment Impaired  Confusion WDL  Danger to Self  Current suicidal ideation? Denies  Danger to Others  Danger to Others None reported or observed

## 2022-12-25 NOTE — Group Note (Signed)
Date:  12/25/2022 Time:  10:26 AM  Group Topic/Focus:  Goals Group:   The focus of this group is to help patients establish daily goals to achieve during treatment and discuss how the patient can incorporate goal setting into their daily lives to aide in recovery.    Participation Level:  Active  Participation Quality:  Appropriate  Affect:  Appropriate  Cognitive:  Appropriate  Insight: Appropriate  Engagement in Group:  Engaged  Modes of Intervention:  Discussion  Additional Comments:  Pt goal: Get anxiety level down"  Donell Beers 12/25/2022, 10:26 AM

## 2022-12-25 NOTE — Plan of Care (Signed)
Collateral Call  Patient's mom, Mithran Whitson, was contacted for collateral call at 5:09PM on 12/25/22, at this number [phone] .  Patient's current diagnosis was explained as evidenced by specific signs/symptoms.  Assisted parents with understanding implications for treatment based on diagnosis and current level of behaviors/symptoms. Parents asked pertinent questions pertaining to understanding diagnosis and implications for treatment.  Karie Fetch, MD PGY-2

## 2022-12-25 NOTE — Plan of Care (Signed)
  Problem: Education: Goal: Emotional status will improve Outcome: Progressing Goal: Mental status will improve Outcome: Progressing   Problem: Activity: Goal: Interest or engagement in activities will improve Outcome: Progressing Goal: Sleeping patterns will improve Outcome: Progressing   Problem: Safety: Goal: Periods of time without injury will increase Outcome: Progressing   

## 2022-12-25 NOTE — Progress Notes (Signed)
   12/25/22 1000  Psych Admission Type (Psych Patients Only)  Admission Status Voluntary  Psychosocial Assessment  Patient Complaints Worrying  Eye Contact Fair  Facial Expression Flat  Affect Preoccupied  Speech Soft;Slow  Interaction Assertive  Motor Activity Slow  Appearance/Hygiene Unremarkable  Behavior Characteristics Cooperative  Mood Suspicious;Preoccupied  Thought Process  Coherency Circumstantial  Content Preoccupation  Delusions Paranoid  Perception Hallucinations  Hallucination Auditory  Judgment Impaired  Confusion None  Danger to Self  Current suicidal ideation? Denies  Danger to Others  Danger to Others None reported or observed

## 2022-12-25 NOTE — Progress Notes (Signed)
[facility] MD Progress Note  12/25/2022 7:11 AM Noah Ramsey  MRN:  [id number]  Principal Problem: Paranoid schizophrenia (HCC) Diagnosis: Principal Problem:   Paranoid schizophrenia (HCC)  Reason for Admission:  Noah Ramsey is a 27 y.o. male  with a past psychiatric history of schizophrenia. Patient initially admitted to [facility] voluntarily on 8/28 for worsening psychosis and bizarre behaviors.  (admitted on 12/17/2022, total  LOS: 8 days )  Yesterday, the psychiatry team made following recommendations:  -Continue Haldol 10 mg po bid for psychosis, will need to gradually taper oral dose starting 10/2   -Give additional injection of haldol deconoate 100mg  IM on 9/5 -Continue Depakote ER 1000 mg po QHS for mood stabilization.  9/3: Depakote level 55 -Continue Cogentin 0.5 mg po bid for prevention of eps.  -Continue Hydroxyzine 25 mg po tid prn for anxiety.  -Continue Trazodone 50 mg po Q hs prn for insomnia -Continue Propranolol 10 mg po bid for elevated pulse rate.  -Continue Vit D3 1000 units po for vit D 3 replacement.    -Received Haldol Decanoate 100 mg IM on 9/2 for mood stabilization (next dose IM Haldol Decanoate 200mg  due 01/20/23)   Chart review: Overnight Events HR 108. BP 145/94. Compliant with all medications. PRN tylenol, atarax, trazodone. Repeat ammonia 49 -> 46.  Pertinent information discussed during bed progression:  Case was discussed in the multidisciplinary team. Slept 7.25 hours.   Information Obtained Today During Patient Interview:  He states that interaction with other people makes him feel better, help cope with a long day. Reports he slept 7 hours. Continues to report urine leakage last night and had to get sheets changed. Denies dysuria. Reports still decreased appetite -- for breakfast ate eggs, milk, grapes, grape juice, blueberry muffin. Reports mood fluctuates from numb to happy, upbeat most of the time, elevated. Denies SI/HI/AVH. Denies paranoia.  Reports last BM was 10 minutes before this Clinical research associate came in. Reports had 5-6 BM yesterday. Continues to report some back pain. Discussed will order voltaren gel.   Notified by nursing that patient didn't want additional LAI due to experiencing nausea in the past. Noah Ramsey and talked with patient. Discussed that we recommend getting additional LAI, discussed that we will add zofran on so that if he does experience nausea that there will be medication to help him.   Past Psychiatric History: Patient reports he was first admitted in a psychiatric hospital in [place], [place]. Says has been admitted in a psychiatric hospitals been admitted to the psychiatric hospital for at least 10-16 times in his life time. His most recent hospitalization was approximately 2 years ago here at [facility]. Says has been admitted to the [facility] in [place], [place]. With similar complaints. He was being followed by Dr. Vance Peper now is with [facility]. Family Psychiatric History: Patient reports, has a brother with mental illness, drug addiction & homeless. Says mental illnesses run his family.  Social History: Live with stepdad, mom, older brother. Family were concerned about him. Social History   Socioeconomic History   Marital status: Single    Spouse name: Not on file   Number of children: Not on file   Years of education: Not on file   Highest education level: Associate degree: occupational, Scientist, product/process development, or vocational program  Occupational History   Not on file  Tobacco Use   Smoking status: Former    Current packs/day: 0.00    Types: Cigarettes    Quit date: 2023    Years since quitting:  1.6   Smokeless tobacco: Never  Vaping Use   Vaping status: Not on file  Substance and Sexual Activity   Alcohol use: Never   Drug use: Never   Sexual activity: Not on file  Other Topics Concern   Not on file  Social History Narrative   Not on file   Social Determinants of Health   Financial Resource Strain: Low Risk   (07/23/2022)   Overall Financial Resource Strain (CARDIA)    Difficulty of Paying Living Expenses: Not hard at all  Food Insecurity: No Food Insecurity (12/17/2022)   Hunger Vital Sign    Worried About Running Out of Food in the Last Year: Never true    Ran Out of Food in the Last Year: Never true  Transportation Needs: No Transportation Needs (12/17/2022)   PRAPARE - Administrator, Civil Service (Medical): No    Lack of Transportation (Non-Medical): No  Physical Activity: Sufficiently Active (07/23/2022)   Exercise Vital Sign    Days of Exercise per Week: 4 days    Minutes of Exercise per Session: 90 min  Stress: Stress Concern Present (07/23/2022)   [employer] of Occupational Health - Occupational Stress Questionnaire    Feeling of Stress : Rather much  Social Connections: Unknown (12/02/2022)   Received from [facility]   Social Network    Social Network: Not on file  Intimate Partner Violence: Not At Risk (12/17/2022)   Humiliation, Afraid, Rape, and Kick questionnaire    Fear of Current or Ex-Partner: No    Emotionally Abused: No    Physically Abused: No    Sexually Abused: No    Past Medical History:  Past Medical History:  Diagnosis Date   Complication of anesthesia    Elevated CPK    per patient   Schizophrenia (HCC)    Family History:  Family History  Problem Relation Age of Onset   Mental illness Brother     Current Medications: Current Facility-Administered Medications  Medication Dose Route Frequency Provider Last Rate Last Admin   acetaminophen (TYLENOL) tablet 650 mg  650 mg Oral Q6H PRN Ardis Hughs, NP   650 mg at 12/24/22 2050   alum & mag hydroxide-simeth (MAALOX/MYLANTA) 200-200-20 MG/5ML suspension 30 mL  30 mL Oral Q4H PRN Ardis Hughs, NP       benztropine (COGENTIN) tablet 0.5 mg  0.5 mg Oral BID Massengill, Nathan, MD   0.5 mg at 12/24/22 1635   diphenhydrAMINE (BENADRYL) capsule 50 mg  50 mg Oral TID PRN Ardis Hughs, NP   50 mg at 12/18/22 1046   Or   diphenhydrAMINE (BENADRYL) injection 50 mg  50 mg Intramuscular TID PRN Ardis Hughs, NP       divalproex (DEPAKOTE ER) 24 hr tablet 1,000 mg  1,000 mg Oral QHS Massengill, Nathan, MD   1,000 mg at 12/24/22 2050   haloperidol (HALDOL) tablet 10 mg  10 mg Oral Q12H Massengill, Harrold Donath, MD   10 mg at 12/24/22 2050   haloperidol (HALDOL) tablet 5 mg  5 mg Oral TID PRN Ardis Hughs, NP   5 mg at 12/18/22 1046   Or   haloperidol lactate (HALDOL) injection 5 mg  5 mg Intramuscular TID PRN Ardis Hughs, NP       haloperidol decanoate (HALDOL DECANOATE) 100 MG/ML injection 100 mg  100 mg Intramuscular Q30 days Starleen Blue, NP   100 mg at 12/23/22 0831   [START ON  12/26/2022] haloperidol decanoate (HALDOL DECANOATE) 100 MG/ML injection 100 mg  100 mg Intramuscular Once Karie Fetch, MD       hydrOXYzine (ATARAX) tablet 25 mg  25 mg Oral TID PRN Ardis Hughs, NP   25 mg at 12/24/22 2050   ibuprofen (ADVIL) tablet 600 mg  600 mg Oral Q6H PRN Bobbitt, Shalon E, NP       lactulose (CHRONULAC) 10 GM/15ML solution 20 g  20 g Oral BID Massengill, Harrold Donath, MD   20 g at 12/24/22 1634   LORazepam (ATIVAN) tablet 2 mg  2 mg Oral TID PRN Ardis Hughs, NP   2 mg at 12/18/22 1046   Or   LORazepam (ATIVAN) injection 2 mg  2 mg Intramuscular TID PRN Ardis Hughs, NP       magnesium hydroxide (MILK OF MAGNESIA) suspension 30 mL  30 mL Oral Daily PRN Ardis Hughs, NP       metFORMIN (GLUCOPHAGE) tablet 500 mg  500 mg Oral BID WC Ardis Hughs, NP   500 mg at 12/24/22 1635   multivitamin with minerals tablet 1 tablet  1 tablet Oral Daily Ardis Hughs, NP   1 tablet at 12/24/22 0816   propranolol (INDERAL) tablet 10 mg  10 mg Oral Q8H Massengill, Harrold Donath, MD   10 mg at 12/24/22 2051   traZODone (DESYREL) tablet 50 mg  50 mg Oral QHS PRN Ardis Hughs, NP   50 mg at 12/24/22 2050   vitamin D3 (CHOLECALCIFEROL) tablet 1,000 Units   1,000 Units Oral Daily Phineas Inches, MD   1,000 Units at 12/24/22 0981    Lab Results:  Results for orders placed or performed during the hospital encounter of 12/17/22 (from the past 48 hour(s))  Valproic acid level     Status: None   Collection Time: 12/24/22  6:24 AM  Result Value Ref Range   Valproic Acid Lvl 55 50.0 - 100.0 ug/mL    Comment: Performed at [facility], [address], [place], [place] [address]  Ammonia     Status: Abnormal   Collection Time: 12/24/22  6:24 AM  Result Value Ref Range   Ammonia 49 (H) 9 - 35 umol/L    Comment: Performed at [facility], [address] [place], [place] [address]    Blood Alcohol level:  Lab Results  Component Value Date   [facility] <10 12/17/2022   ETH <10 08/28/2021    Metabolic Labs: Lab Results  Component Value Date   HGBA1C 6.1 (H) 12/17/2022   MPG 128.37 12/17/2022   MPG 111.15 12/04/2020   No results found for: "PROLACTIN" Lab Results  Component Value Date   CHOL 154 12/17/2022   TRIG 70 12/17/2022   HDL 40 (L) 12/17/2022   CHOLHDL 3.9 12/17/2022   VLDL 14 12/17/2022   LDLCALC 100 (H) 12/17/2022   LDLCALC 93 11/06/2021    Sleep:Sleep: Good   Physical Findings: AIMS:  Facial and Oral Movements Muscles of Facial Expression: None, normal Lips and Perioral Area: None, normal Jaw: None, normal Tongue: None, normal,Extremity Movements Upper (arms, wrists, hands, fingers): None, normal Lower (legs, knees, ankles, toes): None, normal Trunk Movements: None, normal  Neck, shoulders, hips: None, normal Overall Severity Severity of abnormal movements (highest score from questions above): None, normal Incapacitation due to abnormal movements: None, normal Patient's awareness of abnormal movements (rate only patient's report): No Awareness, Dental Status Current problems with teeth and/or dentures?: No Does  patient usually wear dentures?: No   No stiffness, cogwheeling,  or tremors noted on exam.   CIWA:    COWS:     Psychiatric Specialty Exam:  Presentation  General Appearance: Appropriate for Environment  Eye Contact:Good  Speech:Clear and Coherent  Speech Volume:Normal  Handedness:Right   Mood and Affect  Mood:Euthymic  Affect:Congruent   Thought Process  Thought Processes:Coherent  Descriptions of Associations:Intact  Orientation:Full (Time, Place and Person)  Thought Content:Logical  History of Schizophrenia/Schizoaffective disorder:Yes  Duration of Psychotic Symptoms:Greater than six months  Hallucinations:Hallucinations: None  Ideas of Reference:None  Suicidal Thoughts:Suicidal Thoughts: No  Homicidal Thoughts:Homicidal Thoughts: No   Sensorium  Memory:Recent Good; Remote Good  Judgment:Fair  Insight:Fair   Executive Functions  Concentration:Fair  Attention Span:Fair  Recall:Fair  Fund of Knowledge:Fair  Language:Fair   Psychomotor Activity  Psychomotor Activity:Psychomotor Activity: Normal   Assets  Assets:Social Support; Housing; Resilience   Sleep  Sleep:Sleep: Good    Physical Exam: Physical Exam Constitutional:      Appearance: He is obese.  HENT:     Head: Normocephalic and atraumatic.     Mouth/Throat:     Mouth: Mucous membranes are moist.  Cardiovascular:     Rate and Rhythm: Normal rate.  Pulmonary:     Effort: Pulmonary effort is normal.  Musculoskeletal:        General: Normal range of motion.     Cervical back: Normal range of motion.  Skin:    General: Skin is warm and dry.  Neurological:     General: No focal deficit present.     Mental Status: He is alert.  Psychiatric:        Mood and Affect: Affect is blunt.        Speech: Speech normal.        Behavior: Behavior is cooperative.        Judgment: Judgment normal.    Review of Systems  Constitutional: Negative.   HENT: Negative.    Eyes: Negative.   Respiratory: Negative.    Cardiovascular: Negative.    Gastrointestinal: Negative.   Genitourinary:  Positive for frequency.  Musculoskeletal:  Positive for back pain.  Skin: Negative.   Neurological: Negative.   Endo/Heme/Allergies: Negative.    Blood pressure (!) 121/50, pulse (!) 108, temperature 97.9 F (36.6 C), temperature source Oral, resp. rate 20, height 5\' 7"  (1.702 m), weight (!) 167.4 kg, SpO2 97%. Body mass index is 57.79 kg/m.  Treatment Plan Summary: Daily contact with patient to assess and evaluate symptoms and progress in treatment, Medication management, and Plan     ASSESSMENT: Patient appears to be continue to improve, he appears to have more insight into what brought him to the hospital. He received haldol 100mg  LAI 9/2 and does not currently report any side effects. No EPS noted on exam. Given he is currently on total oral haldol 20mg , he will require haldol LAI 200mg  for maintenance dosing, he can receive additional haldol 100mg  on Thursday (3 days after initial dose). Will continue current dose of lactulose given hyperammonemia and multiple bowel movements. Will recheck ammonia level again tomorrow. Depakote level therapeutic now. SW working on getting him reconnected with ACT team.   Diagnoses / Active Problems: Paranoid schizophrenia (HCC)  PLAN: Safety and Monitoring:  --  VOLUNTARY admission to inpatient psychiatric unit for safety, stabilization and treatment  -- Daily contact with patient to assess and evaluate symptoms and progress in treatment  -- Patient's case to be discussed in multi-disciplinary team  meeting  -- Observation Level : q15 minute checks  -- Vital signs:  q12 hours  -- Precautions: suicide, elopement, and assault  2. Psychiatric Diagnoses and Treatment:  -Continue Haldol 10 mg po bid for psychosis, will need to gradually taper oral dose starting 10/2   -Give additional injection of haldol deconoate 100mg  IM on 9/5 -Continue Depakote ER 1000 mg po QHS for mood stabilization.   9/1:  Depakote level 35 -Continue Cogentin 0.5 mg po bid for prevention of eps.  -Continue Hydroxyzine 25 mg po tid prn for anxiety.  -Continue Trazodone 50 mg po Q hs prn for insomnia.  -Continue Propranolol 10 mg po bid for elevated pulse rate.  -Continue Vit D3 1000 units po for vit D 3 replacement.   -Received Haldol Decanoate 100 mg IM on 9/2 for mood stabilization (next dose IM Haldol Decanoate 200mg  due 01/20/23)  -- The risks/benefits/side-effects/alternatives to this medication were discussed in detail with the patient and time was given for questions. The patient consents to medication trial.              -- Metabolic profile and EKG monitoring obtained while on an atypical antipsychotic  BMI: 57.79 TSH: 4.096 Lipid Panel: LDL 100 HbgA1c: 6.1 QTc: 457             -- Encouraged patient to participate in unit milieu and in scheduled group therapies   -- Short Term Goals: Ability to identify changes in lifestyle to reduce recurrence of condition will improve, Ability to verbalize feelings will improve, Ability to disclose and discuss suicidal ideas, Ability to demonstrate self-control will improve, and Compliance with prescribed medications will improve  -- Long Term Goals: Improvement in symptoms so as ready for discharge Other PRNS:  Agitation protocols: Cont as recommended;  -Benadryl 50 mg po or IM tid prn. -Haldol 5 mg po or IM tid prn.  -Lorazepam 2 mg po or IM tid prn.   Other medical concerns being addressed.  -Continue metformin 500 mg po bid for DM management.    Other PRNS -Continue Tylenol 650 mg every 6 hours PRN for mild pain -Continue Maalox 30 ml Q 4 hrs PRN for indigestion -Continue MOM 30 ml po Q 6 hrs for constipation   3. Medical Issues Being Addressed:   #Elevated ammonia -Ammonia level 59 -> 49 Increase lactulose 20g BID   4. Discharge Planning:   -- Social work and case management to assist with discharge planning and identification of hospital follow-up  needs prior to discharge. Discussed with social work regarding ACT team referral. SW following up on ACT team.   -- Estimated LOS: Possible discharge Thursday/Friday  -- Discharge Concerns: Need to establish a safety plan; Medication compliance and effectiveness  -- Discharge Goals: Return home with outpatient referrals for mental health follow-up including medication management/psychotherapy   I certify that inpatient services furnished can reasonably be expected to improve the patient's condition.   This note was created using a voice recognition software as a result there may be grammatical errors inadvertently enclosed that do not reflect the nature of this encounter. Every attempt is made to correct such errors.   Dr. Karie Fetch, MD PGY-2, Psychiatry Residency  9/4/20247:11 AM

## 2022-12-25 NOTE — Progress Notes (Signed)
Adult Psychoeducational Group Note  Date:  12/25/2022 Time:  9:38 PM  Group Topic/Focus:  Wrap-Up Group:   The focus of this group is to help patients review their daily goal of treatment and discuss progress on daily workbooks.  Participation Level:  Active  Participation Quality:  Appropriate  Affect:  Appropriate  Cognitive:  Appropriate  Insight: Appropriate  Engagement in Group:  Engaged  Modes of Intervention:  Discussion  Additional Comments:  Pt stated his day was good.  Pt goal for the day was to eat his vegetables.  Pt met goal.  Lucilla Lame 12/25/2022, 9:38 PM

## 2022-12-25 NOTE — Group Note (Signed)
Recreation Therapy Group Note   Group Topic:Self-Esteem  Group Date: 12/25/2022 Start Time: 1015 End Time: 1050 Facilitators: Krithi Bray-McCall, LRT,CTRS Location: [address] Dayroom   Goal Area(s) Addresses:  Patient will successfully identify positive attributes about themselves.  Patient will identify healthy ways to increase self-esteem. Patient will acknowledge benefit(s) of improved self-esteem.   Group Description: Patients were given a worksheet in which they were to identify their strengths and qualities. As patients worked on [employer], they were also given the opportunity to request songs to be played during group session as long as the songs were clean and appropriate.  Education:  Self-Esteem, Discharge Planning  Education Outcome: Acknowledges education/In group clarification offered/Needs additional education   Affect/Mood: Appropriate   Participation Level: Engaged   Participation Quality: Independent   Behavior: Appropriate   Speech/Thought Process: Focused   Insight: Good   Judgement: Good   Modes of Intervention: Music and Worksheet   Patient Response to Interventions:  Engaged   Education Outcome:  Acknowledges education   Clinical Observations/Individualized Feedback: Pt was engaged and attentive during group session. Pt on task and appropriate throughout group. Pt stated he was good at reading information/maintenance and taking care of he elderly. Pt also identified helping others by unclogging toilets and buying food to cook; values electronics/family and tools; makes others happy through breakfast in bed/surprise gifts and cooking dinner; and is unique through being unpredictable, general knowledge and genuine kindness. Pt explained he helps himself by "reading a lot of information to gain knowledge on specific topics". Pt expressed this helps "gain accurate information and not use extra energy on what needs to be done".    Plan:  Continue to engage patient in RT group sessions 2-3x/week.   Gift Rueckert-McCall, LRT,CTRS 12/25/2022 12:35 PM

## 2022-12-25 NOTE — Progress Notes (Signed)
   12/25/22 0555  15 Minute Checks  Location Bedroom  Visual Appearance Calm  Behavior Sleeping  Sleep (Behavioral Health Patients Only)  Calculate sleep? (Click Yes once per 24 hr at 0600 safety check) Yes  Documented sleep last 24 hours 7.25

## 2022-12-25 NOTE — Progress Notes (Signed)
CSW spoke with Intake Coordinator for [employer], Ammie Ferrier [phone] who reported pt has had services with them and is requesting clinicals to be faxed. CSW will fax and will follow up to determine status of referral.

## 2022-12-26 DIAGNOSIS — F200 Paranoid schizophrenia: Secondary | ICD-10-CM | POA: Diagnosis not present

## 2022-12-26 LAB — AMMONIA: Ammonia: 54 umol/L — ABNORMAL HIGH (ref 9–35)

## 2022-12-26 MED ORDER — LACTULOSE 10 GM/15ML PO SOLN: 20.0000 g | Freq: Two times a day (BID) | ORAL | 0 refills | Status: AC

## 2022-12-26 MED ORDER — BENZTROPINE MESYLATE 0.5 MG PO TABS: 0.5000 mg | ORAL_TABLET | Freq: Two times a day (BID) | ORAL | 0 refills | Status: AC

## 2022-12-26 MED ORDER — METFORMIN HCL 500 MG PO TABS: 500.0000 mg | ORAL_TABLET | Freq: Two times a day (BID) | ORAL | 0 refills | Status: DC

## 2022-12-26 MED ORDER — HALOPERIDOL 10 MG PO TABS: 10.0000 mg | ORAL_TABLET | Freq: Two times a day (BID) | ORAL | 0 refills | Status: DC

## 2022-12-26 MED ORDER — HALOPERIDOL DECANOATE 100 MG/ML IM SOLN: 200.0000 mg | INTRAMUSCULAR | 0 refills | Status: DC

## 2022-12-26 MED ORDER — TRAZODONE HCL 50 MG PO TABS: 50.0000 mg | ORAL_TABLET | Freq: Every evening | ORAL | 0 refills | Status: DC | PRN

## 2022-12-26 MED ORDER — PROPRANOLOL HCL 10 MG PO TABS: 10.0000 mg | ORAL_TABLET | Freq: Three times a day (TID) | ORAL | 0 refills | Status: DC

## 2022-12-26 MED ORDER — DICLOFENAC SODIUM 1 % EX GEL: 2.0000 g | Freq: Four times a day (QID) | CUTANEOUS | Status: DC | PRN

## 2022-12-26 MED ORDER — HYDROXYZINE HCL 25 MG PO TABS: 25.0000 mg | ORAL_TABLET | Freq: Three times a day (TID) | ORAL | 0 refills | Status: DC | PRN

## 2022-12-26 MED ORDER — DIVALPROEX SODIUM ER 250 MG PO TB24
750.0000 mg | ORAL_TABLET | Freq: Every day | ORAL | Status: DC
  Filled 2022-12-26: qty 3
  Filled 2022-12-26: qty 9

## 2022-12-26 MED ORDER — DIVALPROEX SODIUM ER 250 MG PO TB24: 750.0000 mg | ORAL_TABLET | Freq: Every day | ORAL | 0 refills | Status: DC

## 2022-12-26 NOTE — Group Note (Signed)
Date:  12/26/2022 Time:  1:25 PM  Group Topic/Focus:  Goals Group:   The focus of this group is to help patients establish daily goals to achieve during treatment and discuss how the patient can incorporate goal setting into their daily lives to aide in recovery.    Participation Level:  Active  Participation Quality:  Appropriate  Affect:  Appropriate  Cognitive:  Appropriate  Insight: Appropriate  Engagement in Group:  Engaged  Modes of Intervention:  Discussion   Pt said his goal is to meet with his ACT Team and discharged smoothly.   Noah Ramsey 12/26/2022, 1:25 PM

## 2022-12-26 NOTE — Progress Notes (Signed)
Recreation Therapy Notes  INPATIENT RECREATION TR PLAN  Patient Details Name: Noah Ramsey MRN: [id number] DOB: Jul 01, 1995 Today's Date: 12/26/2022  Rec Therapy Plan Is patient appropriate for Therapeutic Recreation?: Yes Treatment times per week: about 3 days Estimated Length of Stay: 5-7 days TR Treatment/Interventions: Group participation (Comment)  Discharge Criteria Pt will be discharged from therapy if:: Discharged Treatment plan/goals/alternatives discussed and agreed upon by:: Patient/family  Discharge Summary Short term goals set: See patient care plan Short term goals met: Complete Progress toward goals comments: Groups attended Which groups?: Self-esteem, Coping skills, Other (Comment) (General Recreation; Team Building) Reason goals not met: None Therapeutic equipment acquired: N/A Reason patient discharged from therapy: Discharge from hospital Pt/family agrees with progress & goals achieved: Yes Date patient discharged from therapy: 12/26/22   Pacey Altizer-McCall, LRT,CTRS Jackson Coffield A Donelle Baba-McCall 12/26/2022, 12:39 PM

## 2022-12-26 NOTE — Progress Notes (Addendum)
[facility] will complete assessment while at [facility], they will arrive between 12:15-12:30 pm. Staff made aware.     1:26 pm ACTT, [facility] present on unit to complete assessment. ACTT will transport pt home and will pick up medications from pharmacy. Pt's mother made aware and is in agreement.

## 2022-12-26 NOTE — [facility] Suicide Risk Assessment (Signed)
[facility] Discharge Suicide Risk Assessment  Principal Problem: Paranoid schizophrenia [facility]) Discharge Diagnoses: Principal Problem:   Paranoid schizophrenia (HCC)  Reason for admission: This is 27 year old obese AA male with hx of schizophrenia. Patient is known to this [facility] from his previous admission/treatments for similar complaints. He is also known in the other psychiatric hospitals with the surrounding areas for treatment of psychosis. Patient per his report, he is currently being followed by [facility] on an outpatient basis. He is being admitted this time around from the [facility] with complaint of worsening psychosis. After evaluation at [facility], he was transferred to the [facility] for further psychiatric evaluation/treatments.   PTA Medications:  -Cogentin 0.5 BID -Invega Trinza  -Effexor 37.5 daily  -Metformin 500 BID   Hospital Course:   During the patient's hospitalization, patient had extensive initial psychiatric evaluation, and follow-up psychiatric evaluations every day.  Psychiatric diagnoses provided upon initial assessment:  -Paranoid schizophrenia   Patient's psychiatric medications were adjusted on admission:  -Initiated Haldol 10 mg po bid for psychosis.  -Increased Depakote ER from 500 mg to 1000 mg po Q hs for mood stabilization. -Continue Cogentin 0.5 mg po bid for prevention of eps.  -Continue Hydroxyzine 25 mg po tid prn for anxiety.  -Continue Trazodone 50 mg po Q hs prn for insomnia.  -Continue Propranolol 10 mg po bid for elevated pulse rate.  -Continue Vit D3 1000 units po for vit D 3 replacement.   During the hospitalization, other adjustments were made to the patient's psychiatric medication regimen:  -Decreased Depakote ER from 1000mg  PO at bedtime to 750mg  at bedtime for mood stabilization due to elevated ammonia  9/3 Depakote level 55. Advise repeat depakote level on 9/9.  9/5 Ammonia level 54. Advise repeat ammonia level on 9/9 -Received Haldol Decanoate 100 mg IM on  9/2 and on 9/5 for mood stabilization (next dose IM Haldol Decanoate 200mg  due 01/25/23)  -Started lactulose 20g BID for elevated ammonia  Patient's care was discussed during the interdisciplinary team meeting every day during the hospitalization.  The patient denied having side effects to prescribed psychiatric medication.  Gradually, patient started adjusting to milieu. The patient was evaluated each day by a clinical provider to ascertain response to treatment. Improvement was noted by the patient's report of decreasing symptoms, improved sleep and appetite, affect, medication tolerance, behavior, and participation in unit programming.  Patient was asked each day to complete a self inventory noting mood, mental status, pain, new symptoms, anxiety and concerns.    Symptoms were reported as significantly decreased or resolved completely by discharge.   On day of discharge, the patient reports that their mood is stable. The patient denied having suicidal thoughts for more than 48 hours prior to discharge.  Patient denies having homicidal thoughts.  Patient denies having auditory hallucinations.  Patient denies any visual hallucinations or other symptoms of psychosis. The patient was motivated to continue taking medication with a goal of continued improvement in mental health. Reports woke up around 4am and then woke up 6am. Slept around 10-11pm. Reports he ate a hard boiled egg, applesauce, diced peaches, chocolate milk, biscuit. Reports he had 6-7 bowel movements yesterday. Reports increased urination in bed at night. Reports mood is pretty good, not as reluctant to do things anymore. Reports felt more reluctant before, an anxiety issue. Reports no racing thoughts in his head. Reports feeling like the injection won't bother him. Reports had some reflux after eating before. Reports he did a back stretch, it's not  stiff anymore but still some pain. Denies SI/HI/AVH. Reports he feels so good that he stops  taking medications but recognizes the importance of continuing to take his medicines.   The patient reports their target psychiatric symptoms of psychosis responded well to the psychiatric medications, and the patient reports overall benefit other psychiatric hospitalization. Supportive psychotherapy was provided to the patient. The patient also participated in regular group therapy while hospitalized. Coping skills, problem solving as well as relaxation therapies were also part of the unit programming.  Labs were reviewed with the patient, and abnormal results were discussed with the patient.  The patient is able to verbalize their individual safety plan to this provider.  Behavioral Events: Given agitation protocol due to insomnia on 8/28  Sleep  Sleep:Sleep: Fair  Musculoskeletal: Strength & Muscle Tone: within normal limits Gait & Station: Normal Patient leans: N/A  Psychiatric Specialty Exam  Presentation  General Appearance:  Appropriate for Environment; Casual; Fairly Groomed  Eye Contact: Good  Speech: Normal Rate; Clear and Coherent  Speech Volume: Normal  Handedness: Right   Mood and Affect  Mood: Euthymic  Affect: Appropriate; Congruent; Full Range   Thought Process  Thought Processes: Linear  Descriptions of Associations: Intact  Orientation: Full (Time, Place and Person)  Thought Content: Logical  History of Schizophrenia/Schizoaffective disorder: Yes  Duration of Psychotic Symptoms:N/A Hallucinations: Hallucinations: None  Ideas of Reference: None  Suicidal Thoughts: Suicidal Thoughts: No  Homicidal Thoughts: Homicidal Thoughts: No   Sensorium  Memory: Immediate Good; Recent Good; Remote Good  Judgment: Fair  Insight: Fair   Art therapist  Concentration: Fair  Attention Span: Fair  Recall: Good  Fund of Knowledge: Good  Language: Good   Psychomotor Activity  Psychomotor Activity: Psychomotor  Activity: Normal   Assets  Assets: Social Support; Housing; Resilience   Sleep  Sleep: Sleep: Fair    Assets  Assets: Social Support; Housing; Resilience  Physical Exam Constitutional:      Appearance: the patient is not toxic-appearing.  Pulmonary:     Effort: Pulmonary effort is normal.  Neurological:     General: No focal deficit present.     Mental Status: the patient is alert and oriented to person, place, and time.   Review of Systems  Respiratory:  Negative for shortness of breath.   Cardiovascular:  Negative for chest pain.  Gastrointestinal:  Negative for abdominal pain, constipation, diarrhea, nausea and vomiting.  Neurological:  Negative for headaches.   Facial and Oral Movements Muscles of Facial Expression: None, normal Lips and Perioral Area: None, normal Jaw: None, normal Tongue: None, normal,Extremity Movements Upper (arms, wrists, hands, fingers): None, normal Lower (legs, knees, ankles, toes): None, normal Trunk Movements: None, normal  Neck, shoulders, hips: None, normal Overall Severity Severity of abnormal movements (highest score from questions above): None, normal Incapacitation due to abnormal movements: None, normal Patient's awareness of abnormal movements (rate only patient's report): No Awareness, Dental Status Current problems with teeth and/or dentures?: No Does patient usually wear dentures?: No   No stiffness, cogwheeling, or tremors noted on exam.   Blood pressure (!) 151/79, pulse 98, temperature 98.3 F (36.8 C), temperature source Oral, resp. rate 20, height 5\' 7"  (1.702 m), weight (!) 167.4 kg, SpO2 95%. Body mass index is 57.79 kg/m.  Mental Status Per Nursing Assessment::   On Admission:  NA (Patient denies suicidal ideation)  Demographic Factors:  Male, Adolescent or young adult, and Low socioeconomic status  Loss Factors: NA  Historical Factors: Impulsivity  Risk Reduction Factors:   Sense of responsibility  to family, Living with another person, especially a relative, and Positive social support  Continued Clinical Symptoms:  Schizophrenia:   Paranoid or undifferentiated type  Cognitive Features That Contribute To Risk:  Loss of executive function    Suicide Risk:  Mild:  Suicidal ideation of limited frequency, intensity, duration, and specificity.  There are no identifiable plans, no associated intent, mild dysphoria and related symptoms, good self-control (both objective and subjective assessment), few other risk factors, and identifiable protective factors, including available and accessible social support.   Follow-up Information     [facility] Follow up on 01/01/2023.   Why: You have a hospital follow up appointment for therapy and medication management services on 01/01/23 at 1:45 pm.   This will be a Virtual telehealth appt. Contact information: [address]  [address] [place] [address] [phone]         Georgina Quint, MD. Schedule an appointment as soon as possible for a visit.   Specialty: Internal Medicine Why: Please schedule a hospital follow up appointment with your primary care provider as soon as possible. Contact information: [address] [place] [address] [phone]         [facility] Follow up.   Why: A referral has been sent on your behalf for ACTT services, please call to schedule initial appt. Contact information: [address] [place] [address] [phone]                 Discharge recommendations:    Activity: as tolerated  Diet: heart healthy  # It is recommended to the patient to continue psychiatric medications as prescribed, after discharge from the hospital.     # It is recommended to the patient to follow up with your outpatient psychiatric provider -instructions on appointment date, time, and address (location) are provided to you in discharge paperwork  # Follow-up with outpatient  primary care doctor and other specialists -for management of chronic medical disease, including:  -pre-diabetes -hyperammonemia  # Testing: Follow-up with outpatient provider for abnormal lab results:  -ammonia 54 -LDL 100 -A1c 6.1   # It was discussed with the patient, the impact of alcohol, drugs, tobacco have been there overall psychiatric and medical wellbeing, and total abstinence from substance use was recommended to the patient.   # Prescriptions provided or sent directly to preferred pharmacy at discharge. Patient agreeable to plan. Given opportunity to ask questions. Appears to feel comfortable with discharge.    # In the event of worsening symptoms, the patient is instructed to call the crisis hotline, 911 and or go to the nearest ED for appropriate evaluation and treatment of symptoms. To follow-up with primary care provider for other medical issues, concerns and or health care needs  Patient agrees with D/C instructions and plan.   Total Time Spent in Direct Patient Care:  I personally spent 30 minutes on the unit in direct patient care. The direct patient care time included face-to-face time with the patient, reviewing the patient's chart, communicating with other professionals, and coordinating care. Greater than 50% of this time was spent in counseling or coordinating care with the patient regarding goals of hospitalization, psycho-education, and discharge planning needs.   Karie Fetch, MD, PGY-2 12/26/2022, 11:53 AM

## 2022-12-26 NOTE — Group Note (Signed)
Recreation Therapy Group Note   Group Topic:Healthy Decision Making  Group Date: 12/26/2022 Start Time: 1000 End Time: 1030 Facilitators: Lavone Barrientes-McCall, LRT,CTRS Location: [address] Dayroom   Goal Area(s) Addresses:  Patient will effectively work with peer towards shared goal.  Patient will identify factors that guided their decision making.  Patient will pro-socially communicate ideas during group session.   Intervention: Survival Scenario - pencil, paper  Group Description: Patients were given a scenario that they were going to be stranded on a deserted [place] for several months before being rescued. Writer tasked them with making a list of 15 things they would choose to bring with them for "survival". The list of items was prioritized most important to least. Each patient would come up with their own list, then work together to create a new list of 15 items while in a group of 3-5 peers. LRT discussed each person's list and how it differed from others. The debrief included discussion of priorities, good decisions versus bad decisions, and how it is important to think before acting so we can make the best decision possible. LRT tied the concept of effective communication among group members to patient's support systems outside of the hospital and its benefit post discharge.   Clinical Observations/Individualized Feedback: Group did not occur due to LRT completing yearly competencies for work role.    Plan: Continue to engage patient in RT group sessions 2-3x/week.   Izabel Chim-McCall, LRT,CTRS  12/26/2022 12:32 PM

## 2022-12-26 NOTE — Discharge Instructions (Signed)

## 2022-12-26 NOTE — Plan of Care (Signed)
Patient engaged with pro-social interactions with peers and staff at least 2x within 5 recreation therapy group sessions.  Yuriana Gaal-McCall, LRT,CTRS

## 2022-12-26 NOTE — [facility] Suicide Risk Assessment (Signed)
[facility] INPATIENT:  Family/Significant Other Suicide Prevention Education  Suicide Prevention Education:  Education Completed; mother, Kasch Pasternack [phone]  (name of family member/significant other) has been identified by the patient as the family member/significant other with whom the patient will be residing, and identified as the person(s) who will aid the patient in the event of a mental health crisis (suicidal ideations/suicide attempt).  With written consent from the patient, the family member/significant other has been provided the following suicide prevention education, prior to the and/or following the discharge of the patient.  CSW received collateral information from pt's mother Cori Razor who reported no safety concerns regarding pt returning home. Mother reported that she secure knives and other sharp items in her home. Pt has ACTT Services with [facility] who will transport, mother receptive of services.   The suicide prevention education provided includes the following: Suicide risk factors Suicide prevention and interventions National Suicide Hotline telephone number [facility] assessment telephone number [facility] Emergency Assistance 911 [facility] and/or Residential Mobile Crisis Unit telephone number  Request made of family/significant other to: Remove weapons (e.g., guns, rifles, knives), all items previously/currently identified as safety concern.   Remove drugs/medications (over-the-counter, prescriptions, illicit drugs), all items previously/currently identified as a safety concern.  The family member/significant other verbalizes understanding of the suicide prevention education information provided.  The family member/significant other agrees to remove the items of safety concern listed above.  Rosi Secrist R 12/26/2022, 1:45 PM

## 2022-12-26 NOTE — Progress Notes (Signed)
Pt discharged to lobby. Pt was stable and appreciative at that time. All papers and prescriptions were given and valuables returned. Verbal understanding expressed. Denies SI/HI and A/VH. Pt given opportunity to express concerns and ask questions.  

## 2022-12-26 NOTE — Discharge Summary (Signed)
Physician Discharge Summary Note  Patient:  Noah Ramsey is an 27 y.o., male MRN:  [id number] DOB:  [birth year] Patient phone:  [phone] (home)  Patient address:   [address] [place] [address],  Total Time spent with patient: 30 minutes  Date of Admission:  12/17/2022 Date of Discharge: 12/26/22  Reason for Admission:  This is 27 year old obese AA male with hx of schizophrenia. Patient is known to this [facility] from his previous admission/treatments for similar complaints. He is also known in the other psychiatric hospitals with the surrounding areas for treatment of psychosis. Patient per his report, he is currently being followed by [facility] on an outpatient basis. He is being admitted this time around from the [facility] with complaint of worsening psychosis. After evaluation at [facility], he was transferred to the [facility] for further psychiatric evaluation/treatments.   Principal Problem: Paranoid schizophrenia [facility]) Discharge Diagnoses: Principal Problem:   Paranoid schizophrenia (HCC)  Past Psychiatric History: Patient reports he was first admitted in a psychiatric hospital in [place], [place]. Says has been admitted in a psychiatric hospitals been admitted to the psychiatric hospital for at least 10-16 times in his life time. His most recent hospitalization was approximately 2 years ago here at [facility]. Says has been admitted to the [facility] in [place], [place]. With similar complaints. He was being followed by Dr. Vance Peper now is with [facility].   Past Medical History:  Past Medical History:  Diagnosis Date   Complication of anesthesia    Elevated CPK    per patient   Schizophrenia [facility])    History reviewed. No pertinent surgical history. Family History:  Family History  Problem Relation Age of Onset   Mental illness Brother    Family Psychiatric  History: Patient reports, has a brother with mental illness, drug addiction & homeless. Says mental illnesses run his  family.   Social History: Live with stepdad, mom, older brother. Family were concerned about him.   Social History   Substance and Sexual Activity  Alcohol Use Never     Social History   Substance and Sexual Activity  Drug Use Never    Social History   Socioeconomic History   Marital status: Single    Spouse name: Not on file   Number of children: Not on file   Years of education: Not on file   Highest education level: Associate degree: occupational, Scientist, product/process development, or vocational program  Occupational History   Not on file  Tobacco Use   Smoking status: Former    Current packs/day: 0.00    Types: Cigarettes    Quit date: 2023    Years since quitting: 1.6   Smokeless tobacco: Never  Vaping Use   Vaping status: Not on file  Substance and Sexual Activity   Alcohol use: Never   Drug use: Never   Sexual activity: Not on file  Other Topics Concern   Not on file  Social History Narrative   Not on file   Social Determinants of Health   Financial Resource Strain: Low Risk  (07/23/2022)   Overall Financial Resource Strain (CARDIA)    Difficulty of Paying Living Expenses: Not hard at all  Food Insecurity: No Food Insecurity (12/17/2022)   Hunger Vital Sign    Worried About Running Out of Food in the Last Year: Never true    Ran Out of Food in the Last Year: Never true  Transportation Needs: No Transportation Needs (12/17/2022)   PRAPARE - Transportation  Lack of Transportation (Medical): No    Lack of Transportation (Non-Medical): No  Physical Activity: Sufficiently Active (07/23/2022)   Exercise Vital Sign    Days of Exercise per Week: 4 days    Minutes of Exercise per Session: 90 min  Stress: Stress Concern Present (07/23/2022)   [employer] of Occupational Health - Occupational Stress Questionnaire    Feeling of Stress : Rather much  Social Connections: Unknown (12/02/2022)   Received from [facility]   Social Network    Social Network: Not on file    Hospital  Course:   During the patient's hospitalization, patient had extensive initial psychiatric evaluation, and follow-up psychiatric evaluations every day.   Psychiatric diagnoses provided upon initial assessment:  -Paranoid schizophrenia    Patient's psychiatric medications were adjusted on admission:  -Initiated Haldol 10 mg po bid for psychosis.  -Increased Depakote ER from 500 mg to 1000 mg po Q hs for mood stabilization. -Continue Cogentin 0.5 mg po bid for prevention of eps.  -Continue Hydroxyzine 25 mg po tid prn for anxiety.  -Continue Trazodone 50 mg po Q hs prn for insomnia.  -Continue Propranolol 10 mg po bid for elevated pulse rate.  -Continue Vit D3 1000 units po for vit D 3 replacement.    During the hospitalization, other adjustments were made to the patient's psychiatric medication regimen:  -Decreased Depakote ER from 1000mg  PO at bedtime to 750mg  at bedtime for mood stabilization due to elevated ammonia  9/3 Depakote level 55. Advise repeat depakote level on 9/9.  9/5 Ammonia level 54. Advise repeat ammonia level on 9/9 -Received Haldol Decanoate 100 mg IM on 9/2 and on 9/5 for mood stabilization (next dose IM Haldol Decanoate 200mg  due 01/25/23)  -Started lactulose 20g BID for elevated ammonia   Patient's care was discussed during the interdisciplinary team meeting every day during the hospitalization.   The patient denied having side effects to prescribed psychiatric medication.   Gradually, patient started adjusting to milieu. The patient was evaluated each day by a clinical provider to ascertain response to treatment. Improvement was noted by the patient's report of decreasing symptoms, improved sleep and appetite, affect, medication tolerance, behavior, and participation in unit programming.  Patient was asked each day to complete a self inventory noting mood, mental status, pain, new symptoms, anxiety and concerns.     Symptoms were reported as significantly decreased or  resolved completely by discharge.    On day of discharge, the patient reports that their mood is stable. The patient denied having suicidal thoughts for more than 48 hours prior to discharge.  Patient denies having homicidal thoughts.  Patient denies having auditory hallucinations.  Patient denies any visual hallucinations or other symptoms of psychosis. The patient was motivated to continue taking medication with a goal of continued improvement in mental health. Reports woke up around 4am and then woke up 6am. Slept around 10-11pm. Reports he ate a hard boiled egg, applesauce, diced peaches, chocolate milk, biscuit. Reports he had 6-7 bowel movements yesterday. Reports increased urination in bed at night. Reports mood is pretty good, not as reluctant to do things anymore. Reports felt more reluctant before, an anxiety issue. Reports no racing thoughts in his head. Reports feeling like the injection won't bother him. Reports had some reflux after eating before. Reports he did a back stretch, it's not stiff anymore but still some pain. Denies SI/HI/AVH. Reports he feels so good that he stops taking medications but recognizes the importance of continuing to  take his medicines.    The patient reports their target psychiatric symptoms of psychosis responded well to the psychiatric medications, and the patient reports overall benefit other psychiatric hospitalization. Supportive psychotherapy was provided to the patient. The patient also participated in regular group therapy while hospitalized. Coping skills, problem solving as well as relaxation therapies were also part of the unit programming.   Labs were reviewed with the patient, and abnormal results were discussed with the patient.   The patient is able to verbalize their individual safety plan to this provider.   Behavioral Events: Given agitation protocol due to insomnia on 8/28  # It is recommended to the patient to continue psychiatric medications as  prescribed, after discharge from the hospital.    # It is recommended to the patient to follow up with your outpatient psychiatric provider and PCP.  # It was discussed with the patient, the impact of alcohol, drugs, tobacco have been there overall psychiatric and medical wellbeing, and total abstinence from substance use was recommended to the patient.  # Prescriptions provided or sent directly to preferred pharmacy at discharge. Patient agreeable to plan. Given opportunity to ask questions. Appears to feel comfortable with discharge.    # In the event of worsening symptoms, the patient is instructed to call the crisis hotline, 911 and or go to the nearest ED for appropriate evaluation and treatment of symptoms. To follow-up with primary care provider for other medical issues, concerns and or health care needs  # Patient was discharged home with ACT team with a plan to follow up as noted below.   Physical Findings: AIMS:  Facial and Oral Movements Muscles of Facial Expression: None, normal Lips and Perioral Area: None, normal Jaw: None, normal Tongue: None, normal,Extremity Movements Upper (arms, wrists, hands, fingers): None, normal Lower (legs, knees, ankles, toes): None, normal Trunk Movements: None, normal  Neck, shoulders, hips: None, normal Overall Severity Severity of abnormal movements (highest score from questions above): None, normal Incapacitation due to abnormal movements: None, normal Patient's awareness of abnormal movements (rate only patient's report): No Awareness, Dental Status Current problems with teeth and/or dentures?: No Does patient usually wear dentures?: No   No stiffness, cogwheeling, or tremors noted on exam.   CIWA:    COWS:     Musculoskeletal: Strength & Muscle Tone: within normal limits Gait & Station: normal Patient leans: N/A  Psychiatric Specialty Exam General Appearance:  Appropriate for Environment; Casual; Fairly Groomed   Eye  Contact: Good   Speech: Normal Rate; Clear and Coherent   Speech Volume: Normal   Handedness: Right     Mood and Affect  Mood: Euthymic   Affect: Appropriate; Congruent; Full Range     Thought Process  Thought Processes: Linear   Descriptions of Associations: Intact   Orientation: Full (Time, Place and Person)   Thought Content: Logical   History of Schizophrenia/Schizoaffective disorder: Yes   Duration of Psychotic Symptoms:N/A Hallucinations: Hallucinations: None   Ideas of Reference: None   Suicidal Thoughts: Suicidal Thoughts: No   Homicidal Thoughts: Homicidal Thoughts: No     Sensorium  Memory: Immediate Good; Recent Good; Remote Good   Judgment: Fair   Insight: Fair     Art therapist  Concentration: Fair   Attention Span: Fair   Recall: Good   Fund of Knowledge: Good   Language: Good     Psychomotor Activity  Psychomotor Activity: Psychomotor Activity: Normal     Assets  Assets: Social Support; Housing; Resilience  Sleep  Sleep: Sleep: Fair       Assets  Assets: Social Support; Housing; Resilience   Physical Exam Constitutional:      Appearance: the patient is not toxic-appearing.  Pulmonary:     Effort: Pulmonary effort is normal.  Neurological:     General: No focal deficit present.     Mental Status: the patient is alert and oriented to person, place, and time.    Review of Systems  Respiratory:  Negative for shortness of breath.   Cardiovascular:  Negative for chest pain.  Gastrointestinal:  Negative for abdominal pain, constipation, diarrhea, nausea and vomiting.  Neurological:  Negative for headaches.   Blood pressure (!) 151/79, pulse 98, temperature 98.3 F (36.8 C), temperature source Oral, resp. rate 20, height 5\' 7"  (1.702 m), weight (!) 167.4 kg, SpO2 95%. Body mass index is 57.79 kg/m.  Social History   Tobacco Use  Smoking Status Former   Current packs/day: 0.00    Types: Cigarettes   Quit date: 2023   Years since quitting: 1.6  Smokeless Tobacco Never   Tobacco Cessation:  N/A, patient does not currently use tobacco products  Blood Alcohol level:  Lab Results  Component Value Date   ETH <10 12/17/2022   ETH <10 08/28/2021    Metabolic Disorder Labs:  Lab Results  Component Value Date   HGBA1C 6.1 (H) 12/17/2022   MPG 128.37 12/17/2022   MPG 111.15 12/04/2020   No results found for: "PROLACTIN" Lab Results  Component Value Date   CHOL 154 12/17/2022   TRIG 70 12/17/2022   HDL 40 (L) 12/17/2022   CHOLHDL 3.9 12/17/2022   VLDL 14 12/17/2022   LDLCALC 100 (H) 12/17/2022   LDLCALC 93 11/06/2021    Discharge destination:  Home  Is patient on multiple antipsychotic therapies at discharge:  No   Has Patient had three or more failed trials of antipsychotic monotherapy by history:  No  Recommended Plan for Multiple Antipsychotic Therapies: NA  Discharge Instructions     Diet - low sodium heart healthy   Complete by: As directed    Increase activity slowly   Complete by: As directed       Allergies as of 12/26/2022   No Known Allergies      Medication List     STOP taking these medications    diphenhydrAMINE 25 MG tablet Commonly known as: BENADRYL   Invega Trinza 546 MG/1.75ML injection Generic drug: Paliperidone Palmitate ER   triamcinolone cream 0.1 % Commonly known as: KENALOG   venlafaxine XR 37.5 MG 24 hr capsule Commonly known as: EFFEXOR-XR       TAKE these medications      Indication  benztropine 0.5 MG tablet Commonly known as: COGENTIN Take 1 tablet (0.5 mg total) by mouth 2 (two) times daily for 14 days. What changed: how much to take  Indication: Extrapyramidal Reaction caused by Medications   diclofenac Sodium 1 % Gel Commonly known as: VOLTAREN Apply 2 g topically 4 (four) times daily as needed (apply to back).  Indication: pain   divalproex 250 MG 24 hr tablet Commonly known as:  DEPAKOTE ER Take 3 tablets (750 mg total) by mouth at bedtime for 14 days. What changed:  medication strength See the new instructions.  Indication: Manic Phase of Manic-Depression   Fish Oil 1000 MG Caps Take 1,000 mg by mouth daily.  Indication: vitamin   haloperidol 10 MG tablet Commonly known as: HALDOL Take 1 tablet (10 mg  total) by mouth every 12 (twelve) hours for 14 days.  Indication: Psychosis   haloperidol decanoate 100 MG/ML injection Commonly known as: HALDOL DECANOATE Inject 2 mLs (200 mg total) into the muscle every 30 (thirty) days for 1 dose. Next dose is due on 01-25-2023. Start taking on: January 25, 2023  Indication: Schizophrenia, mood stabilization   hydrOXYzine 25 MG tablet Commonly known as: ATARAX Take 1 tablet (25 mg total) by mouth 3 (three) times daily as needed for anxiety.  Indication: Feeling Anxious   lactulose 10 GM/15ML solution Commonly known as: CHRONULAC Take 30 mLs (20 g total) by mouth 2 (two) times daily for 7 days.  Indication: high ammonia   MENS MULTIVITAMIN PO Take by mouth.  Indication: vitamin   metFORMIN 500 MG tablet Commonly known as: GLUCOPHAGE Take 1 tablet (500 mg total) by mouth 2 (two) times daily with a meal for 14 days. :Medication induced wt management  Indication: Body Weight Gain due to Antipsychotic Medication Use   Ozempic (0.25 or 0.5 MG/DOSE) 2 MG/3ML Sopn Generic drug: Semaglutide(0.25 or 0.5MG /DOS) DIAL AND INJECT UNDER THE SKIN 0.5 MG WEEKLY  Indication: Type 2 Diabetes   propranolol 10 MG tablet Commonly known as: INDERAL Take 1 tablet (10 mg total) by mouth every 8 (eight) hours for 14 days.  Indication: Feeling Anxious, High Blood Pressure Disorder   traZODone 50 MG tablet Commonly known as: DESYREL Take 1 tablet (50 mg total) by mouth at bedtime as needed for sleep.  Indication: Trouble Sleeping   Vitamin D3 250 MCG (10000 UT) capsule Take 10,000 Units by mouth daily.  Indication: vitamin         Follow-up Information     [facility] Follow up on 01/01/2023.   Why: You have a hospital follow up appointment for therapy and medication management services on 01/01/23 at 1:45 pm.   This will be a Virtual telehealth appt. Contact information: [address]  [address] [place] [address] [phone]         Georgina Quint, MD. Schedule an appointment as soon as possible for a visit.   Specialty: Internal Medicine Why: Please schedule a hospital follow up appointment with your primary care provider as soon as possible. Contact information: [address] [place] [address] [phone]         [facility] Follow up.   Why: A referral has been sent on your behalf for ACTT services, please call to schedule initial appt. Contact information: [address] [place] [address] [phone]                 Discharge recommendations:  Activity: as tolerated  Diet: heart healthy  # It is recommended to the patient to continue psychiatric medications as prescribed, after discharge from the hospital.     # It is recommended to the patient to follow up with your outpatient psychiatric provider -instructions on appointment date, time, and address (location) are provided to you in discharge paperwork  # Follow-up with outpatient primary care doctor and other specialists -for management of chronic medical disease, including:  -pre-diabetes -hyperammonemia  # Testing: Follow-up with outpatient provider for abnormal lab results:  -ammonia 54 -LDL 100 -A1c 6.1   # It was discussed with the patient, the impact of alcohol, drugs, tobacco have been there overall psychiatric and medical wellbeing, and total abstinence from substance use was recommended to the patient.   # Prescriptions provided or sent directly to preferred pharmacy  at discharge. Patient agreeable to plan. Given opportunity to ask questions. Appears to feel comfortable  with discharge.    # In the event of worsening symptoms, the patient is instructed to call the crisis hotline, 911 and or go to the nearest ED for appropriate evaluation and treatment of symptoms. To follow-up with primary care provider for other medical issues, concerns and or health care needs  Patient agrees with D/C instructions and plan.   Total Time Spent in Direct Patient Care:  I personally spent 30 minutes on the unit in direct patient care. The direct patient care time included face-to-face time with the patient, reviewing the patient's chart, communicating with other professionals, and coordinating care. Greater than 50% of this time was spent in counseling or coordinating care with the patient regarding goals of hospitalization, psycho-education, and discharge planning needs.   Signed: Karie Fetch, MD, PGY-2 12/26/2022, 12:45 PM

## 2022-12-26 NOTE — Progress Notes (Signed)
  [facility] Adult Case Management Discharge Plan :  Will you be returning to the same living situation after discharge:  Yes,  pt will be returning home with mother [place]ite Dow[place], [phone] At discharge, do you have transportation home?: Yes,  pt will be transported by [employer], [facility] [phone] Do you have the ability to pay for your medications: Yes,  pt has active medical coverage  Release of information consent forms completed and in the chart;  Patient's signature needed at discharge.  Patient to Follow up at:  Follow-up Information     [facility] Follow up on 01/01/2023.   Why: You have a hospital follow up appointment for therapy and medication management services on 01/01/23 at 1:45 pm.   This will be a Virtual telehealth appt. Contact information: [address]  [address] [place] [address] [phone]         Georgina Quint, MD. Schedule an appointment as soon as possible for a visit.   Specialty: Internal Medicine Why: Please schedule a hospital follow up appointment with your primary care provider as soon as possible. Contact information: [address] [place] [address] [phone]         [facility] Follow up.   Why: A referral has been sent on your behalf for ACTT services, please call to schedule initial appt. Contact information: [address] [place] [address] [phone]                 Next level of care provider has access to [facility] Link:no  Safety Planning and Suicide Prevention discussed: Yes,  SPE discussed with pt and pt's mother, Tochukwu Butron.     Has patient been referred to the Quitline?: Patient does not use tobacco/nicotine products  Patient has been referred for addiction treatment: No known substance use disorder.  Ilya Neely, Candace Cruise, LCSWA 12/26/2022, 1:41 PM

## 2022-12-26 NOTE — Progress Notes (Signed)
   12/26/22 0543  15 Minute Checks  Location Bedroom  Visual Appearance Calm  Behavior Sleeping  Sleep (Behavioral Health Patients Only)  Calculate sleep? (Click Yes once per 24 hr at 0600 safety check) Yes  Documented sleep last 24 hours 7.25

## 2023-01-16 ENCOUNTER — Ambulatory Visit (HOSPITAL_COMMUNITY)
Admission: EM | Admit: 2023-01-16 | Discharge: 2023-01-16 | Disposition: A | Discharge status: Home / Self Care | Payer: 59

## 2023-01-16 NOTE — ED Notes (Signed)
Pt was triaged, but left without being seen by provider.

## 2023-01-16 NOTE — Progress Notes (Signed)
   01/16/23 0556  [facility] Triage Screening (Walk-ins at [facility] only)  How Did You Hear About [place]? Self  What Is the Reason for Your Visit/Call Today? Pt presents to [facility] voluntarily, unaccompanied with complaint of anxiety and feeling like his "brain is rattling". Pt reports having dreams about a seeing a skeleton goat as the grim reaper and feeling a rattling sensation in his brain. Pt reports waking up and having a difficult time falling back to sleep or the ability to relax. Pt reports receiving ACTT services through [facility] and had appointment yesterday. Pt reports that he is now prescribed Haldol injection since his most recent impatient hospitalization at [facility] earlier this month. Pt denies SI,HI,AVH and substance/alcohol use.  How Long Has This Been Causing You Problems? <Week  Have You Recently Had Any Thoughts About Hurting Yourself? No  How long ago did you have thoughts about hurting yourself? pt denies  Are You Planning to Commit Suicide/Harm Yourself At This time? No  Have you Recently Had Thoughts About Hurting Someone [name]? No  Are You Planning To Harm Someone At This Time? No  Explanation: pt denies  Are you currently experiencing any auditory, visual or other hallucinations? No  Have You Used Any Alcohol or Drugs in the Past 24 Hours? No  What Did You Use and How Much? pt denies  Do you have any current medical co-morbidities that require immediate attention? No  Clinician description of patient physical appearance/behavior: Calm, cooperative,oriented  What Do You Feel Would Help You the Most Today? Treatment for Depression or other mood problem  If access to [facility] Urgent Care was not available, would you have sought care in the Emergency Department? No  Determination of Need Routine (7 days)  Options For Referral Other: Comment;Outpatient Therapy;Medication Management

## 2023-01-23 ENCOUNTER — Ambulatory Visit: Payer: 59 | Admitting: Emergency Medicine

## 2023-03-17 ENCOUNTER — Encounter: Payer: 59 | Admitting: Emergency Medicine

## 2023-03-19 ENCOUNTER — Encounter: Payer: 59 | Admitting: Emergency Medicine

## 2023-03-19 ENCOUNTER — Encounter: Payer: Self-pay | Admitting: Emergency Medicine

## 2023-03-19 ENCOUNTER — Ambulatory Visit: Payer: 59 | Admitting: Emergency Medicine

## 2023-03-19 VITALS — BP 138/98 | HR 103 | Temp 98.4°F | Ht 67.0 in | Wt 371.4 lb

## 2023-03-19 DIAGNOSIS — Z13220 Encounter for screening for lipoid disorders: Secondary | ICD-10-CM | POA: Diagnosis not present

## 2023-03-19 DIAGNOSIS — R32 Unspecified urinary incontinence: Secondary | ICD-10-CM | POA: Diagnosis not present

## 2023-03-19 DIAGNOSIS — Z13228 Encounter for screening for other metabolic disorders: Secondary | ICD-10-CM

## 2023-03-19 DIAGNOSIS — F200 Paranoid schizophrenia: Secondary | ICD-10-CM

## 2023-03-19 DIAGNOSIS — R7303 Prediabetes: Secondary | ICD-10-CM | POA: Diagnosis not present

## 2023-03-19 DIAGNOSIS — Z0001 Encounter for general adult medical examination with abnormal findings: Secondary | ICD-10-CM | POA: Diagnosis not present

## 2023-03-19 DIAGNOSIS — Z8739 Personal history of other diseases of the musculoskeletal system and connective tissue: Secondary | ICD-10-CM

## 2023-03-19 DIAGNOSIS — Z1329 Encounter for screening for other suspected endocrine disorder: Secondary | ICD-10-CM

## 2023-03-19 DIAGNOSIS — Z130 Encounter for screening for diseases of the blood and blood-forming organs and certain disorders involving the immune mechanism: Secondary | ICD-10-CM

## 2023-03-19 LAB — COMPREHENSIVE METABOLIC PANEL
ALT: 25 U/L (ref 0–53)
AST: 32 U/L (ref 0–37)
Albumin: 4.2 g/dL (ref 3.5–5.2)
Alkaline Phosphatase: 63 U/L (ref 39–117)
BUN: 9 mg/dL (ref 6–23)
CO2: 33 meq/L — ABNORMAL HIGH (ref 19–32)
Calcium: 9.5 mg/dL (ref 8.4–10.5)
Chloride: 100 meq/L (ref 96–112)
Creatinine, Ser: 0.92 mg/dL (ref 0.40–1.50)
GFR: 114.26 mL/min (ref >60.00)
Glucose, Bld: 87 mg/dL (ref 70–99)
Potassium: 3.8 meq/L (ref 3.5–5.1)
Sodium: 139 meq/L (ref 135–145)
Total Bilirubin: 0.5 mg/dL (ref 0.2–1.2)
Total Protein: 8 g/dL (ref 6.0–8.3)

## 2023-03-19 LAB — CBC WITH DIFFERENTIAL/PLATELET
Basophils Absolute: 0 10*3/uL (ref 0.0–0.1)
Basophils Relative: 0.3 % (ref 0.0–3.0)
Eosinophils Absolute: 0.1 10*3/uL (ref 0.0–0.7)
Eosinophils Relative: 2.2 % (ref 0.0–5.0)
HCT: 43.8 % (ref 39.0–52.0)
Hemoglobin: 14.4 g/dL (ref 13.0–17.0)
Lymphocytes Relative: 35.7 % (ref 12.0–46.0)
Lymphs Abs: 1.8 10*3/uL (ref 0.7–4.0)
MCHC: 33 g/dL (ref 30.0–36.0)
MCV: 93.4 fL (ref 78.0–100.0)
Monocytes Absolute: 0.5 10*3/uL (ref 0.1–1.0)
Monocytes Relative: 10.3 % (ref 3.0–12.0)
Neutro Abs: 2.6 10*3/uL (ref 1.4–7.7)
Neutrophils Relative %: 51.5 % (ref 43.0–77.0)
Platelets: 210 10*3/uL (ref 150.0–400.0)
RBC: 4.69 Mil/uL (ref 4.22–5.81)
RDW: 14.5 % (ref 11.5–15.5)
WBC: 5 10*3/uL (ref 4.0–10.5)

## 2023-03-19 LAB — URINALYSIS
Bilirubin Urine: NEGATIVE
Ketones, ur: NEGATIVE
Leukocytes,Ua: NEGATIVE
Nitrite: NEGATIVE
Specific Gravity, Urine: 1.01 (ref 1.000–1.030)
Total Protein, Urine: NEGATIVE
Urine Glucose: NEGATIVE
Urobilinogen, UA: 0.2 (ref 0.0–1.0)
pH: 8.5 — AB (ref 5.0–8.0)

## 2023-03-19 LAB — LIPID PANEL
Cholesterol: 139 mg/dL (ref 0–200)
HDL: 36 mg/dL — ABNORMAL LOW (ref >39.00)
LDL Cholesterol: 73 mg/dL (ref 0–99)
NonHDL: 103.32
Total CHOL/HDL Ratio: 4
Triglycerides: 153 mg/dL — ABNORMAL HIGH (ref 0.0–149.0)
VLDL: 30.6 mg/dL (ref 0.0–40.0)

## 2023-03-19 LAB — TSH: TSH: 2.13 u[IU]/mL (ref 0.35–5.50)

## 2023-03-19 LAB — HEMOGLOBIN A1C: Hgb A1c MFr Bld: 6.3 % (ref 4.6–6.5)

## 2023-03-19 NOTE — Assessment & Plan Note (Signed)
Only during his sleep. States he goes into a deep sleep most nights Most likely related to chronic psychiatric condition and chronic psychiatric medications

## 2023-03-19 NOTE — Progress Notes (Signed)
Noah Ramsey 27 y.o.   Chief Complaint  Patient presents with   Annual Exam    Patient wanted his foot examined. Patient wanting all labs done today. Patient states he tried taking 2000mg  of metformin a day to help with his hunger. Pt also c/o urinary incontinence that has been going on for awhile. Having some SOB consistently throughout the day     HISTORY OF PRESENT ILLNESS: This is a 27 y.o. male here for annual exam Occasional urinary incontinence Intermittent dyspnea for no reason during the day. History of schizophrenia.  Stable. On medications. No other complaints or medical concerns today.  HPI   Prior to Admission medications   Medication Sig Start Date End Date Taking? Authorizing Provider  Cholecalciferol (VITAMIN D3) 250 MCG (10000 UT) capsule Take 10,000 Units by mouth daily.   Yes [provider]  diclofenac Sodium (VOLTAREN) 1 % GEL Apply 2 g topically 4 (four) times daily as needed (apply to back). 12/26/22  Yes Massengill, Harrold Donath, MD  hydrOXYzine (ATARAX) 25 MG tablet Take 1 tablet (25 mg total) by mouth 3 (three) times daily as needed for anxiety. 12/26/22  Yes Massengill, Harrold Donath, MD  Multiple Vitamins-Minerals (MENS MULTIVITAMIN PO) Take by mouth.   Yes [provider]  Omega-3 Fatty Acids (FISH OIL) 1000 MG CAPS Take 1,000 mg by mouth daily.   Yes [provider]  Semaglutide,0.25 or 0.5MG /DOS, (OZEMPIC, 0.25 OR 0.5 MG/DOSE,) 2 MG/3ML SOPN DIAL AND INJECT UNDER THE SKIN 0.5 MG WEEKLY 06/24/22  Yes Roper Tolson, Eilleen Kempf, MD  traZODone (DESYREL) 50 MG tablet Take 1 tablet (50 mg total) by mouth at bedtime as needed for sleep. 12/26/22  Yes Massengill, Harrold Donath, MD  benztropine (COGENTIN) 0.5 MG tablet Take 1 tablet (0.5 mg total) by mouth 2 (two) times daily for 14 days. 12/26/22 01/09/23  Massengill, Harrold Donath, MD  divalproex (DEPAKOTE ER) 250 MG 24 hr tablet Take 3 tablets (750 mg total) by mouth at bedtime for 14 days. 12/26/22 01/09/23  Massengill,  Harrold Donath, MD  haloperidol (HALDOL) 10 MG tablet Take 1 tablet (10 mg total) by mouth every 12 (twelve) hours for 14 days. 12/26/22 01/09/23  Massengill, Harrold Donath, MD  haloperidol decanoate (HALDOL DECANOATE) 100 MG/ML injection Inject 2 mLs (200 mg total) into the muscle every 30 (thirty) days for 1 dose. Next dose is due on 01-25-2023. 01/25/23 01/26/23  Phineas Inches, MD  metFORMIN (GLUCOPHAGE) 500 MG tablet Take 1 tablet (500 mg total) by mouth 2 (two) times daily with a meal for 14 days. :Medication induced wt management 12/26/22 01/09/23  Massengill, Harrold Donath, MD  propranolol (INDERAL) 10 MG tablet Take 1 tablet (10 mg total) by mouth every 8 (eight) hours for 14 days. 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  Social History   Socioeconomic History   Marital status: Single    Spouse name: Not on file   Number of children: Not on file   Years of education: Not on file   Highest education level: Associate degree: occupational, Scientist, product/process development, or vocational program  Occupational History   Not on file  Tobacco Use   Smoking status: Former    Current packs/day: 0.00    Types: Cigarettes    Quit date: 2023    Years  since quitting: 1.9   Smokeless tobacco: Never  Vaping Use   Vaping status: Not on file  Substance and Sexual Activity   Alcohol use: Never   Drug use: Never   Sexual activity: Not on file  Other Topics Concern   Not on file  Social History Narrative   Not on file   Social Determinants of Health   Financial Resource Strain: Medium Risk (03/14/2023)   Overall Financial Resource Strain (CARDIA)    Difficulty of Paying Living Expenses: Somewhat hard  Food Insecurity: No Food Insecurity (03/14/2023)   Hunger Vital Sign    Worried About Running Out of Food in the Last Year: Never true    Ran Out of Food in the Last Year: Never true  Transportation Needs: No Transportation Needs (03/14/2023)   PRAPARE - Administrator, Civil Service (Medical): No    Lack of Transportation (Non-Medical): No  Physical Activity: Sufficiently Active (03/14/2023)   Exercise Vital Sign    Days of Exercise per Week: 4 days    Minutes of Exercise per Session: 40 min  Stress: Stress Concern Present (03/14/2023)   [employer] of Occupational Health - Occupational Stress Questionnaire    Feeling of Stress : To some extent  Social Connections: Moderately Isolated (03/14/2023)   Social Connection and Isolation Panel [NHANES]    Frequency of Communication with Friends and Family: More than three times a week    Frequency of Social Gatherings with Friends and Family: Patient declined    Attends Religious Services: Never    Database administrator or Organizations: No    Attends Engineer, structural: More than 4 times per year    Marital Status: Never married  Intimate Partner Violence: Not At Risk (12/17/2022)   Humiliation, Afraid, Rape, and Kick questionnaire    Fear of Current or Ex-Partner: No    Emotionally Abused: No    Physically Abused: No    Sexually Abused: No    Family History  Problem Relation Age of Onset   Mental illness Brother      Review of Systems   Constitutional: Negative.  Negative for chills and fever.  HENT: Negative.  Negative for congestion and sore throat.   Respiratory: Negative.  Negative for cough and shortness of breath.   Cardiovascular: Negative.  Negative for chest pain and palpitations.  Gastrointestinal:  Negative for abdominal pain, diarrhea, nausea and vomiting.  Genitourinary:  Positive for frequency. Negative for dysuria and hematuria.  Skin: Negative.  Negative for rash.  Neurological: Negative.  Negative for dizziness and headaches.  All other systems reviewed and are negative.   Vitals:   03/19/23 1335  BP: (!) 138/98  Pulse: (!) 103  Temp: 98.4 F (36.9 C)  SpO2: 93%    Physical Exam Vitals reviewed.  Constitutional:      Appearance: Normal appearance.  HENT:     Head: Normocephalic.  Right Ear: Tympanic membrane, ear canal and external ear normal.     Left Ear: Tympanic membrane, ear canal and external ear normal.     Mouth/Throat:     Mouth: Mucous membranes are moist.     Pharynx: Oropharynx is clear.  Eyes:     Extraocular Movements: Extraocular movements intact.     Conjunctiva/sclera: Conjunctivae normal.     Pupils: Pupils are equal, round, and reactive to light.  Cardiovascular:     Rate and Rhythm: Normal rate and regular rhythm.     Pulses: Normal pulses.     Heart sounds: Normal heart sounds.  Pulmonary:     Effort: Pulmonary effort is normal.     Breath sounds: Normal breath sounds.  Abdominal:     Palpations: Abdomen is soft.     Tenderness: There is no abdominal tenderness.  Musculoskeletal:     Cervical back: No tenderness.  Lymphadenopathy:     Cervical: No cervical adenopathy.  Skin:    General: Skin is warm and dry.     Capillary Refill: Capillary refill takes less than 2 seconds.  Neurological:     General: No focal deficit present.     Mental Status: He is alert and oriented to person, place, and time.  Psychiatric:        Mood and Affect: Mood normal.         Behavior: Behavior normal.      ASSESSMENT & PLAN: Problem List Items Addressed This Visit       Other   History of rhabdomyolysis    Advised to stay well-hydrated      Prediabetes    Cardiovascular risks associated with diabetes discussed Diet and nutrition discussed Hemoglobin A1c done today      Morbid obesity (HCC)    Diet and nutrition discussed Advised to decrease amount of daily carbohydrate intake and daily calories and increase amount of plant-based protein in his diet Benefits of exercise discussed      Paranoid schizophrenia (HCC)    Clinically stable.  Sees psychiatrist on a regular basis. All medications handled by psychiatrist office      Urinary incontinence    Only during his sleep. States he goes into a deep sleep most nights Most likely related to chronic psychiatric condition and chronic psychiatric medications      Other Visit Diagnoses     Encounter for general adult medical examination with abnormal findings    -  Primary   Relevant Orders   CBC with Differential   Comprehensive metabolic panel   Hemoglobin A1c   Lipid panel   TSH   Screening for deficiency anemia       Relevant Orders   CBC with Differential   Screening for lipoid disorders       Relevant Orders   Lipid panel   Screening for endocrine, metabolic and immunity disorder       Relevant Orders   Comprehensive metabolic panel   Hemoglobin A1c   TSH   Urinalysis      Modifiable risk factors discussed with patient. Anticipatory guidance according to age provided. The following topics were also discussed: Social Determinants of Health Smoking.  Non-smoker Diet and nutrition Benefits of exercise Cancer family history review Vaccinations review and recommendations Cardiovascular risk assessment and need for blood work Review of chronic medical conditions Review of all medications Mental health including depression and anxiety Fall and accident  prevention   Patient Instructions  Health Maintenance, Male Adopting  a healthy lifestyle and getting preventive care are important in promoting health and wellness. Ask your health care provider about: The right schedule for you to have regular tests and exams. Things you can do on your own to prevent diseases and keep yourself healthy. What should I know about diet, weight, and exercise? Eat a healthy diet  Eat a diet that includes plenty of vegetables, fruits, low-fat dairy products, and lean protein. Do not eat a lot of foods that are high in solid fats, added sugars, or sodium. Maintain a healthy weight Body mass index (BMI) is a measurement that can be used to identify possible weight problems. It estimates body fat based on height and weight. Your health care provider can help determine your BMI and help you achieve or maintain a healthy weight. Get regular exercise Get regular exercise. This is one of the most important things you can do for your health. Most adults should: Exercise for at least 150 minutes each week. The exercise should increase your heart rate and make you sweat (moderate-intensity exercise). Do strengthening exercises at least twice a week. This is in addition to the moderate-intensity exercise. Spend less time sitting. Even light physical activity can be beneficial. Watch cholesterol and blood lipids Have your blood tested for lipids and cholesterol at 27 years of age, then have this test every 5 years. You may need to have your cholesterol levels checked more often if: Your lipid or cholesterol levels are high. You are older than 27 years of age. You are at high risk for heart disease. What should I know about cancer screening? Many types of cancers can be detected early and may often be prevented. Depending on your health history and family history, you may need to have cancer screening at various ages. This may include screening for: Colorectal  cancer. Prostate cancer. Skin cancer. Lung cancer. What should I know about heart disease, diabetes, and high blood pressure? Blood pressure and heart disease High blood pressure causes heart disease and increases the risk of stroke. This is more likely to develop in people who have high blood pressure readings or are overweight. Talk with your health care provider about your target blood pressure readings. Have your blood pressure checked: Every 3-5 years if you are 35-12 years of age. Every year if you are 29 years old or older. If you are between the ages of 93 and 30 and are a current or former smoker, ask your health care provider if you should have a one-time screening for abdominal aortic aneurysm (AAA). Diabetes Have regular diabetes screenings. This checks your fasting blood sugar level. Have the screening done: Once every three years after age 27 if you are at a normal weight and have a low risk for diabetes. More often and at a younger age if you are overweight or have a high risk for diabetes. What should I know about preventing infection? Hepatitis B If you have a higher risk for hepatitis B, you should be screened for this virus. Talk with your health care provider to find out if you are at risk for hepatitis B infection. Hepatitis C Blood testing is recommended for: Everyone born from [birth year] through 1965. Anyone with known risk factors for hepatitis C. Sexually transmitted infections (STIs) You should be screened each year for STIs, including gonorrhea and chlamydia, if: You are sexually active and are younger than 27 years of age. You are older than 27 years of age and your health care provider tells  you that you are at risk for this type of infection. Your sexual activity has changed since you were last screened, and you are at increased risk for chlamydia or gonorrhea. Ask your health care provider if you are at risk. Ask your health care provider about whether you are at  high risk for HIV. Your health care provider may recommend a prescription medicine to help prevent HIV infection. If you choose to take medicine to prevent HIV, you should first get tested for HIV. You should then be tested every 3 months for as long as you are taking the medicine. Follow these instructions at home: Alcohol use Do not drink alcohol if your health care provider tells you not to drink. If you drink alcohol: Limit how much you have to 0-2 drinks a day. Know how much alcohol is in your drink. In the [place] one drink equals one 12 oz bottle of beer (355 mL), one 5 oz glass of wine (148 mL), or one 1 oz glass of hard liquor (44 mL). Lifestyle Do not use any products that contain nicotine or tobacco. These products include cigarettes, chewing tobacco, and vaping devices, such as e-cigarettes. If you need help quitting, ask your health care provider. Do not use street drugs. Do not share needles. Ask your health care provider for help if you need support or information about quitting drugs. General instructions Schedule regular health, dental, and eye exams. Stay current with your vaccines. Tell your health care provider if: You often feel depressed. You have ever been abused or do not feel safe at home. Summary Adopting a healthy lifestyle and getting preventive care are important in promoting health and wellness. Follow your health care provider's instructions about healthy diet, exercising, and getting tested or screened for diseases. Follow your health care provider's instructions on monitoring your cholesterol and blood pressure. This information is not intended to replace advice given to you by your health care provider. Make sure you discuss any questions you have with your health care provider. Document Revised: 08/28/2020 Document Reviewed: 08/28/2020 Elsevier Patient Education  2024 Elsevier Inc.      Edwina Barth, MD [facility]

## 2023-03-19 NOTE — Patient Instructions (Signed)
Health Maintenance, Male Adopting a healthy lifestyle and getting preventive care are important in promoting health and wellness. Ask your health care provider about: The right schedule for you to have regular tests and exams. Things you can do on your own to prevent diseases and keep yourself healthy. What should I know about diet, weight, and exercise? Eat a healthy diet  Eat a diet that includes plenty of vegetables, fruits, low-fat dairy products, and lean protein. Do not eat a lot of foods that are high in solid fats, added sugars, or sodium. Maintain a healthy weight Body mass index (BMI) is a measurement that can be used to identify possible weight problems. It estimates body fat based on height and weight. Your health care provider can help determine your BMI and help you achieve or maintain a healthy weight. Get regular exercise Get regular exercise. This is one of the most important things you can do for your health. Most adults should: Exercise for at least 150 minutes each week. The exercise should increase your heart rate and make you sweat (moderate-intensity exercise). Do strengthening exercises at least twice a week. This is in addition to the moderate-intensity exercise. Spend less time sitting. Even light physical activity can be beneficial. Watch cholesterol and blood lipids Have your blood tested for lipids and cholesterol at 27 years of age, then have this test every 5 years. You may need to have your cholesterol levels checked more often if: Your lipid or cholesterol levels are high. You are older than 27 years of age. You are at high risk for heart disease. What should I know about cancer screening? Many types of cancers can be detected early and may often be prevented. Depending on your health history and family history, you may need to have cancer screening at various ages. This may include screening for: Colorectal cancer. Prostate cancer. Skin cancer. Lung  cancer. What should I know about heart disease, diabetes, and high blood pressure? Blood pressure and heart disease High blood pressure causes heart disease and increases the risk of stroke. This is more likely to develop in people who have high blood pressure readings or are overweight. Talk with your health care provider about your target blood pressure readings. Have your blood pressure checked: Every 3-5 years if you are 18-39 years of age. Every year if you are 40 years old or older. If you are between the ages of 65 and 75 and are a current or former smoker, ask your health care provider if you should have a one-time screening for abdominal aortic aneurysm (AAA). Diabetes Have regular diabetes screenings. This checks your fasting blood sugar level. Have the screening done: Once every three years after age 45 if you are at a normal weight and have a low risk for diabetes. More often and at a younger age if you are overweight or have a high risk for diabetes. What should I know about preventing infection? Hepatitis B If you have a higher risk for hepatitis B, you should be screened for this virus. Talk with your health care provider to find out if you are at risk for hepatitis B infection. Hepatitis C Blood testing is recommended for: Everyone born from 1945 through 1965. Anyone with known risk factors for hepatitis C. Sexually transmitted infections (STIs) You should be screened each year for STIs, including gonorrhea and chlamydia, if: You are sexually active and are younger than 27 years of age. You are older than 27 years of age and your   health care provider tells you that you are at risk for this type of infection. Your sexual activity has changed since you were last screened, and you are at increased risk for chlamydia or gonorrhea. Ask your health care provider if you are at risk. Ask your health care provider about whether you are at high risk for HIV. Your health care provider  may recommend a prescription medicine to help prevent HIV infection. If you choose to take medicine to prevent HIV, you should first get tested for HIV. You should then be tested every 3 months for as long as you are taking the medicine. Follow these instructions at home: Alcohol use Do not drink alcohol if your health care provider tells you not to drink. If you drink alcohol: Limit how much you have to 0-2 drinks a day. Know how much alcohol is in your drink. In the [place] one drink equals one 12 oz bottle of beer (355 mL), one 5 oz glass of wine (148 mL), or one 1 oz glass of hard liquor (44 mL). Lifestyle Do not use any products that contain nicotine or tobacco. These products include cigarettes, chewing tobacco, and vaping devices, such as e-cigarettes. If you need help quitting, ask your health care provider. Do not use street drugs. Do not share needles. Ask your health care provider for help if you need support or information about quitting drugs. General instructions Schedule regular health, dental, and eye exams. Stay current with your vaccines. Tell your health care provider if: You often feel depressed. You have ever been abused or do not feel safe at home. Summary Adopting a healthy lifestyle and getting preventive care are important in promoting health and wellness. Follow your health care provider's instructions about healthy diet, exercising, and getting tested or screened for diseases. Follow your health care provider's instructions on monitoring your cholesterol and blood pressure. This information is not intended to replace advice given to you by your health care provider. Make sure you discuss any questions you have with your health care provider. Document Revised: 08/28/2020 Document Reviewed: 08/28/2020 Elsevier Patient Education  2024 Elsevier Inc.  

## 2023-03-19 NOTE — Assessment & Plan Note (Signed)
Cardiovascular risks associated with diabetes discussed Diet and nutrition discussed Hemoglobin A1c done today

## 2023-03-19 NOTE — Assessment & Plan Note (Signed)
Diet and nutrition discussed.  Advised to decrease amount of daily carbohydrate intake and daily calories and increase amount of plant-based protein in his diet. Benefits of exercise discussed.

## 2023-03-19 NOTE — Assessment & Plan Note (Signed)
Advised to stay well-hydrated

## 2023-03-19 NOTE — Assessment & Plan Note (Signed)
Clinically stable.  Sees psychiatrist on a regular basis. All medications handled by psychiatrist office

## 2023-03-24 ENCOUNTER — Other Ambulatory Visit: Payer: Self-pay | Admitting: Radiology

## 2023-03-26 ENCOUNTER — Telehealth: Payer: Self-pay | Admitting: Emergency Medicine

## 2023-03-26 ENCOUNTER — Other Ambulatory Visit: Payer: Self-pay | Admitting: Radiology

## 2023-03-26 MED ORDER — OZEMPIC (0.25 OR 0.5 MG/DOSE) 2 MG/3ML ~~LOC~~ SOPN: PEN_INJECTOR | SUBCUTANEOUS | 5 refills | Status: DC

## 2023-03-26 NOTE — Telephone Encounter (Signed)
Prescription Request  03/26/2023  LOV: 03/19/2023  Pt is requesting for Metformin but this Rx has ended. Please inform pt the reasoning why.  What is the name of the medication or equipment?   Semaglutide,0.25 or 0.5MG /DOS, (OZEMPIC, 0.25 OR 0.5 MG/DOSE,) 2 MG/3ML    Have you contacted your pharmacy to request a refill? No   Which pharmacy would you like this sent to?    [facility] - [place], [place] - [address], [address] [place] [address] Phone: [phone] Fax: [phone] Patient notified that their request is being sent to the clinical staff for review and that they should receive a response within 2 business days.   Please advise at Mobile [phone] (mobile)

## 2023-03-26 NOTE — Telephone Encounter (Signed)
LVM for patient to call back in regards of the metformin. Dr. Alvy Bimler did not prescribe this and he will need to reach out to the MD that prescribe it; Dr. Phineas Inches MD for refill

## 2023-03-26 NOTE — Telephone Encounter (Signed)
Okay to refill metformin 

## 2023-04-25 ENCOUNTER — Telehealth: Payer: Self-pay

## 2023-04-25 ENCOUNTER — Other Ambulatory Visit (HOSPITAL_COMMUNITY): Payer: Self-pay

## 2023-04-25 NOTE — Telephone Encounter (Signed)
 Pharmacy Patient Advocate Encounter   Received notification from CoverMyMeds that prior authorization for Ozempic  (0.25 or 0.5 MG/DOSE) 2MG /3ML pen-injectors is required/requested.   Insurance verification completed.   The patient is insured through [facility] .   Per test claim: PA required; PA submitted to above mentioned insurance via CoverMyMeds Key/confirmation #/EOC [id number] Status is pending

## 2023-04-29 ENCOUNTER — Telehealth: Payer: Self-pay

## 2023-04-29 ENCOUNTER — Encounter: Payer: Self-pay | Admitting: Radiology

## 2023-04-29 NOTE — Telephone Encounter (Signed)
 Pharmacy Patient Advocate Encounter  Received notification from [employer] that Prior Authorization for Ozempic  (0.25 or 0.5 MG/DOSE) 2MG /3ML pen-injectors has been DENIED.  See denial reason below. No denial letter attached in CMM. Will attach denial letter to Media tab once received.

## 2023-05-29 ENCOUNTER — Other Ambulatory Visit: Payer: Self-pay | Admitting: Emergency Medicine

## 2023-05-29 MED ORDER — METFORMIN HCL 500 MG PO TABS: 500.0000 mg | ORAL_TABLET | Freq: Two times a day (BID) | ORAL | 0 refills | Status: DC

## 2023-05-29 NOTE — Telephone Encounter (Signed)
 New prescription for metformin  sent to pharmacy of record today.

## 2023-06-07 ENCOUNTER — Other Ambulatory Visit: Payer: Self-pay | Admitting: Emergency Medicine

## 2023-06-18 ENCOUNTER — Encounter: Payer: Self-pay | Admitting: Emergency Medicine

## 2023-06-19 NOTE — Telephone Encounter (Signed)
 No changes necessary regarding the metformin.  Thanks.

## 2023-06-26 ENCOUNTER — Encounter: Payer: Self-pay | Admitting: Emergency Medicine

## 2023-06-29 ENCOUNTER — Other Ambulatory Visit: Payer: Self-pay | Admitting: Emergency Medicine

## 2023-06-29 MED ORDER — OZEMPIC (0.25 OR 0.5 MG/DOSE) 2 MG/3ML ~~LOC~~ SOPN: PEN_INJECTOR | SUBCUTANEOUS | 5 refills | Status: DC

## 2023-06-29 NOTE — Telephone Encounter (Signed)
 New prescription for Ozempic sent to pharmacy of record today.  Patient is not diabetic so this may not be approved.  Thanks.

## 2023-07-01 ENCOUNTER — Other Ambulatory Visit (HOSPITAL_COMMUNITY): Payer: Self-pay

## 2023-07-18 ENCOUNTER — Telehealth: Payer: Self-pay

## 2023-07-18 NOTE — Telephone Encounter (Signed)
 Copied from CRM [id number]. Topic: Clinical - Prescription Issue >> Jul 18, 2023 11:57 AM Gurney Maxin H wrote: Reason for CRM: Patient is calling in to request a 90 day supply refill for the metFORMIN (GLUCOPHAGE) 500 MG tablet, medication shows discontinued by provider, please reach out to patient for some clarity, thanks.  [facility]  [phone]

## 2023-07-22 ENCOUNTER — Other Ambulatory Visit (HOSPITAL_COMMUNITY): Payer: Self-pay

## 2023-07-22 NOTE — Telephone Encounter (Signed)
 Okay to send prescription for metformin 1000 mg twice a day

## 2023-07-28 ENCOUNTER — Other Ambulatory Visit: Payer: Self-pay | Admitting: Radiology

## 2023-07-28 MED ORDER — METFORMIN HCL 1000 MG PO TABS: 1000.0000 mg | ORAL_TABLET | Freq: Two times a day (BID) | ORAL | 1 refills | Status: DC

## 2023-07-30 ENCOUNTER — Ambulatory Visit (INDEPENDENT_AMBULATORY_CARE_PROVIDER_SITE_OTHER)

## 2023-07-30 VITALS — Ht 67.0 in | Wt 370.0 lb

## 2023-07-30 DIAGNOSIS — Z0000 Encounter for general adult medical examination without abnormal findings: Secondary | ICD-10-CM

## 2023-07-30 NOTE — Patient Instructions (Addendum)
 Mr. Plagge , Thank you for taking time to come for your Medicare Wellness Visit. I appreciate your ongoing commitment to your health goals. Please review the following plan we discussed and let me know if I can assist you in the future.   Referrals/Orders/Follow-Ups/Clinician Recommendations: It was nice to talk with you today.  Aim for 30 minutes of exercise or brisk walking, 6-8 glasses of water, and 5 servings of fruits and vegetables each day.   This is a list of the screening recommended for you and due dates:  Health Maintenance  Topic Date Due   COVID-19 Vaccine (2 - Mixed Product risk series) 03/25/2023   Flu Shot  11/21/2023   Medicare Annual Wellness Visit  07/29/2024   Hepatitis C Screening  Completed   HIV Screening  Completed   HPV Vaccine  Aged Out   DTaP/Tdap/Td vaccine  Discontinued    Advanced directives: (Declined) Advance directive discussed with you today. Even though you declined this today, please call our office should you change your mind, and we can give you the proper paperwork for you to fill out.  Next Medicare Annual Wellness Visit scheduled for next year: Yes

## 2023-07-30 NOTE — Progress Notes (Signed)
 Subjective:   Noah Ramsey is a 28 y.o. who presents for a Medicare Wellness preventive visit.  Visit Complete: Virtual I connected with  Noah Ramsey on 07/30/23 by a video and audio enabled telemedicine application and verified that I am speaking with the correct person using two identifiers.  Patient Location: Home  Provider Location: Home Office  I discussed the limitations of evaluation and management by telemedicine. The patient expressed understanding and agreed to proceed.  Vital Signs: Because this visit was a virtual/telehealth visit, some criteria may be missing or patient reported. Any vitals not documented were not able to be obtained and vitals that have been documented are patient reported.   Persons Participating in Visit: Patient.  AWV Questionnaire: Yes: Patient Medicare AWV questionnaire was completed by the patient on 07/27/2023; I have confirmed that all information answered by patient is correct and no changes since this date.  Cardiac Risk Factors include: advanced age (>62men, >85 women)     Objective:    Today's Vitals   07/30/23 1129  Weight: (!) 370 lb (167.8 kg)  Height: 5\' 7"  (1.702 m)   Body mass index is 57.95 kg/m.     07/30/2023   11:39 AM 12/17/2022    3:51 PM 11/04/2022    8:26 PM 06/03/2022    8:51 AM 02/15/2022    1:35 PM 04/16/2021    6:44 PM 01/26/2021    4:57 PM  Advanced Directives  Does Patient Have a Medical Advance Directive? No  No No No No No  Would patient like information on creating a medical advance directive?     No - Patient declined No - Patient declined      Information is confidential and restricted. Go to Review Flowsheets to unlock data.    Current Medications (verified) Outpatient Encounter Medications as of 07/30/2023  Medication Sig   Cholecalciferol (VITAMIN D3) 250 MCG (10000 UT) capsule Take 10,000 Units by mouth daily.   hydrOXYzine (ATARAX) 25 MG tablet Take 1 tablet (25 mg total) by mouth 3  (three) times daily as needed for anxiety.   metFORMIN (GLUCOPHAGE) 1000 MG tablet Take 1 tablet (1,000 mg total) by mouth 2 (two) times daily with a meal.   benztropine (COGENTIN) 0.5 MG tablet Take 1 tablet (0.5 mg total) by mouth 2 (two) times daily for 14 days.   diclofenac Sodium (VOLTAREN) 1 % GEL Apply 2 g topically 4 (four) times daily as needed (apply to back).   divalproex (DEPAKOTE ER) 250 MG 24 hr tablet Take 3 tablets (750 mg total) by mouth at bedtime for 14 days.   haloperidol (HALDOL) 10 MG tablet Take 1 tablet (10 mg total) by mouth every 12 (twelve) hours for 14 days.   haloperidol decanoate (HALDOL DECANOATE) 100 MG/ML injection Inject 2 mLs (200 mg total) into the muscle every 30 (thirty) days for 1 dose. Next dose is due on 01-25-2023.   metFORMIN (GLUCOPHAGE) 500 MG tablet Take 1 tablet (500 mg total) by mouth 2 (two) times daily with a meal for 14 days. :Medication induced wt management   Multiple Vitamins-Minerals (MENS MULTIVITAMIN PO) Take by mouth.   Omega-3 Fatty Acids (FISH OIL) 1000 MG CAPS Take 1,000 mg by mouth daily.   propranolol (INDERAL) 10 MG tablet Take 1 tablet (10 mg total) by mouth every 8 (eight) hours for 14 days.   Semaglutide,0.25 or 0.5MG /DOS, (OZEMPIC, 0.25 OR 0.5 MG/DOSE,) 2 MG/3ML SOPN DIAL AND INJECT UNDER THE SKIN 0.5 MG WEEKLY (Patient not  taking: Reported on 07/30/2023)   traZODone (DESYREL) 50 MG tablet Take 1 tablet (50 mg total) by mouth at bedtime as needed for sleep. (Patient not taking: Reported on 07/30/2023)   No facility-administered encounter medications on file as of 07/30/2023.    Allergies (verified) Patient has no known allergies.   History: Past Medical History:  Diagnosis Date   Complication of anesthesia    Elevated CPK    per patient   Schizophrenia [facility])    History reviewed. No pertinent surgical history. Family History  Problem Relation Age of Onset   Mental illness Brother    Social History   Socioeconomic History    Marital status: Single    Spouse name: Not on file   Number of children: Not on file   Years of education: Not on file   Highest education level: Associate degree: occupational, Scientist, product/process development, or vocational program  Occupational History   Occupation: disablied  Tobacco Use   Smoking status: Former    Current packs/day: 0.00    Types: Cigarettes    Quit date: 2023    Years since quitting: 2.2   Smokeless tobacco: Never  Vaping Use   Vaping status: Never Used  Substance and Sexual Activity   Alcohol use: Never   Drug use: Never   Sexual activity: Not on file  Other Topics Concern   Not on file  Social History Narrative   Lives with family   Social Drivers of Health   Financial Resource Strain: High Risk (07/27/2023)   Overall Financial Resource Strain (CARDIA)    Difficulty of Paying Living Expenses: Hard  Food Insecurity: Food Insecurity Present (07/27/2023)   Hunger Vital Sign    Worried About Running Out of Food in the Last Year: Often true    Ran Out of Food in the Last Year: Often true  Transportation Needs: No Transportation Needs (07/27/2023)   PRAPARE - Administrator, Civil Service (Medical): No    Lack of Transportation (Non-Medical): No  Physical Activity: Sufficiently Active (07/27/2023)   Exercise Vital Sign    Days of Exercise per Week: 5 days    Minutes of Exercise per Session: 30 min  Stress: No Stress Concern Present (07/27/2023)   [employer] of Occupational Health - Occupational Stress Questionnaire    Feeling of Stress : Only a little  Social Connections: Socially Isolated (07/27/2023)   Social Connection and Isolation Panel [NHANES]    Frequency of Communication with Friends and Family: More than three times a week    Frequency of Social Gatherings with Friends and Family: Twice a week    Attends Religious Services: Never    Database administrator or Organizations: No    Attends Engineer, structural: Not on file    Marital Status:  Never married    Tobacco Counseling Counseling given: Not Answered    Clinical Intake:  Pre-visit preparation completed: Yes  Pain : No/denies pain     BMI - recorded: 57.95 Nutritional Status: BMI > 30  Obese Nutritional Risks: Nausea/ vomitting/ diarrhea Diabetes: No  Lab Results  Component Value Date   HGBA1C 6.3 03/19/2023   HGBA1C 6.1 (H) 12/17/2022   HGBA1C 6.1 07/24/2022     How often do you need to have someone help you when you read instructions, pamphlets, or other written materials from your doctor or pharmacy?: 1 - Never  Interpreter Needed?: No  Information entered by :: Wayne Wicklund, RMA   Activities of Daily  Living     07/30/2023   11:31 AM 12/17/2022    6:42 PM  In your present state of health, do you have any difficulty performing the following activities:  Hearing? 0   Vision? 0   Difficulty concentrating or making decisions? 0   Walking or climbing stairs? 0   Dressing or bathing? 0   Doing errands, shopping? 0   Comment transportation from insurance or family   Preparing Food and eating ? N   Using the Toilet? N   In the past six months, have you accidently leaked urine? N   Do you have problems with loss of bowel control? N   Managing your Medications? N   Managing your Finances? N   Housekeeping or managing your Housekeeping? N      Information is confidential and restricted. Go to Review Flowsheets to unlock data.    Patient Care Team: Georgina Quint, MD as PCP - General (Internal Medicine)  Indicate any recent Medical Services you may have received from other than [facility] providers in the past year (date may be approximate).     Assessment:   This is a routine wellness examination for [name].  Hearing/Vision screen Hearing Screening - Comments:: Denies hearing difficulties   Vision Screening - Comments:: Wears eyeglasses   Goals Addressed               This Visit's Progress     Patient Stated (pt-stated)         Patient would like to stop over eating and isolation.       Depression Screen     07/30/2023   11:41 AM 03/19/2023    1:42 PM 07/24/2022    2:09 PM 06/12/2022    1:57 PM 02/15/2022    1:34 PM 11/06/2021    1:10 PM 05/31/2021    2:55 PM  PHQ 2/9 Scores  PHQ - 2 Score 6 3 0 0 0 0 5  PHQ- 9 Score 13 4         Fall Risk     07/30/2023   11:39 AM 03/19/2023    1:42 PM 07/24/2022    2:09 PM 06/12/2022    1:57 PM 02/15/2022    1:32 PM  Fall Risk   Falls in the past year? 0 0 0 0 0  Number falls in past yr: 0 0 0 0 0  Injury with Fall? 0 0 0 0 0  Risk for fall due to : No Fall Risks No Fall Risks No Fall Risks No Fall Risks No Fall Risks  Follow up Falls prevention discussed;Falls evaluation completed Falls evaluation completed Falls evaluation completed Falls evaluation completed Falls prevention discussed    MEDICARE RISK AT HOME:  Medicare Risk at Home Any stairs in or around the home?: Yes If so, are there any without handrails?: Yes Home free of loose throw rugs in walkways, pet beds, electrical cords, etc?: Yes Adequate lighting in your home to reduce risk of falls?: Yes Life alert?: No Use of a cane, walker or w/c?: No Grab bars in the bathroom?: No Shower chair or bench in shower?: No Elevated toilet seat or a handicapped toilet?: No  TIMED UP AND GO:  Was the test performed?  No  Cognitive Function: Declined: Patient declined cognitive screening, but was able to answer questions in an accurate and timely manner. No cognitive impairments observed.        02/15/2022    1:36 PM  6CIT Screen  What Year? 0 points  What month? 0 points  What time? 0 points  Count back from 20 0 points  Months in reverse 0 points  Repeat phrase 0 points  Total Score 0 points    Immunizations Immunization History  Administered Date(s) Administered   Influenza,inj,Quad PF,6+ Mos 06/12/2022   Influenza-Unspecified 02/25/2023   Unspecified SARS-COV-2 Vaccination 02/25/2023     Screening Tests Health Maintenance  Topic Date Due   Medicare Annual Wellness (AWV)  02/16/2023   COVID-19 Vaccine (2 - Mixed Product risk series) 03/25/2023   INFLUENZA VACCINE  11/21/2023   Hepatitis C Screening  Completed   HIV Screening  Completed   HPV VACCINES  Aged Out   DTaP/Tdap/Td  Discontinued    Health Maintenance  Health Maintenance Due  Topic Date Due   Medicare Annual Wellness (AWV)  02/16/2023   COVID-19 Vaccine (2 - Mixed Product risk series) 03/25/2023   Health Maintenance Items Addressed: See Nurse Notes  Additional Screening:  Vision Screening: Recommended annual ophthalmology exams for early detection of glaucoma and other disorders of the eye.  Dental Screening: Recommended annual dental exams for proper oral hygiene  Community Resource Referral / Chronic Care Management: CRR required this visit?  No   CCM required this visit?  No     Plan:     I have personally reviewed and noted the following in the patient's chart:   Medical and social history Use of alcohol, tobacco or illicit drugs  Current medications and supplements including opioid prescriptions. Patient is not currently taking opioid prescriptions. Functional ability and status Nutritional status Physical activity Advanced directives List of other physicians Hospitalizations, surgeries, and ER visits in previous 12 months Vitals Screenings to include cognitive, depression, and falls Referrals and appointments  In addition, I have reviewed and discussed with patient certain preventive protocols, quality metrics, and best practice recommendations. A written personalized care plan for preventive services as well as general preventive health recommendations were provided to patient.     Jamesrobert Ohanesian L Melvia Matousek, CMA   07/30/2023   After Visit Summary: (MyChart) Due to this being a telephonic visit, the after visit summary with patients personalized plan was offered to patient via  MyChart   Notes: Please refer to Routing Comments.

## 2023-08-11 ENCOUNTER — Telehealth: Payer: Self-pay | Admitting: Pharmacy Technician

## 2023-08-11 ENCOUNTER — Other Ambulatory Visit (HOSPITAL_COMMUNITY): Payer: Self-pay

## 2023-08-11 NOTE — Telephone Encounter (Signed)
 Ozempic/Mounjaro is approved exclusively as an adjunct to diet and exercise to improve glycemic control in adults with type 2 diabetes mellitus.  A review of patient's medical chart reveals no documented diagnosis of type 2 diabetes or an A1C indicative of diabetes. Therefore, they do not currently meet the criteria for prior authorization of this medication.  If clinically appropriate, alternative options such as Delford Field, or Reginal Lutes may be considered for this patient.

## 2023-08-19 ENCOUNTER — Ambulatory Visit: Admitting: Emergency Medicine

## 2023-08-19 DIAGNOSIS — G4733 Obstructive sleep apnea (adult) (pediatric): Secondary | ICD-10-CM | POA: Diagnosis not present

## 2023-08-19 DIAGNOSIS — R569 Unspecified convulsions: Secondary | ICD-10-CM | POA: Diagnosis not present

## 2023-08-19 DIAGNOSIS — R7303 Prediabetes: Secondary | ICD-10-CM | POA: Diagnosis not present

## 2023-08-21 ENCOUNTER — Other Ambulatory Visit: Payer: Self-pay | Admitting: Emergency Medicine

## 2023-08-21 ENCOUNTER — Encounter: Payer: Self-pay | Admitting: Emergency Medicine

## 2023-08-21 NOTE — Telephone Encounter (Signed)
 Copied from CRM [id number]. Topic: General - Other >> Aug 21, 2023  1:25 PM Chuck Crater wrote: Reason for CRM: Patient wants to know if we received his Bariatric surgery support letter. Letter was faxed on 04/29 at 5:40 pm.

## 2023-08-21 NOTE — Telephone Encounter (Signed)
 Copied from CRM [id number]. Topic: Clinical - Medication Refill >> Aug 21, 2023  1:28 PM Chuck Crater wrote: Most Recent Primary Care Visit:  Provider: TYUS, BRENDELL L  Department: [facility]  Visit Type: ANNUAL WELL VISIT, SEQUENTIAL  Date: 07/30/2023  Medication: Semaglutide ,0.25 or 0.5MG /DOS, (OZEMPIC , 0.25 OR 0.5 MG/DOSE,) 2 MG/3ML SOPN   Has the patient contacted their pharmacy? Yes (Agent: If no, request that the patient contact the pharmacy for the refill. If patient does not wish to contact the pharmacy document the reason why and proceed with request.) (Agent: If yes, when and what did the pharmacy advise?) advised to contact provider  Is this the correct pharmacy for this prescription? Yes If no, delete pharmacy and type the correct one.  This is the patient's preferred pharmacy:  [id number] - [place], [place] - [address] [place] [address] Phone: [phone] Fax: [phone]  [id number] - [place], [place] - [address] AT [address] [place] [address] Phone: [phone] Fax: [phone]  [facility] - [place], [place] - [address] [place] [address] Phone: [phone] Fax: [phone]   Has the prescription been filled recently? No  Is the patient out of the medication? Yes  Has the patient been seen for an appointment in the last year OR does the patient have an upcoming appointment? Yes  Can we respond through MyChart? Yes  Agent: Please be advised that Rx refills may take up to 3 business days. We ask that you follow-up with your pharmacy.

## 2023-08-22 ENCOUNTER — Telehealth: Payer: Self-pay

## 2023-08-22 ENCOUNTER — Other Ambulatory Visit: Payer: Self-pay | Admitting: Radiology

## 2023-08-22 ENCOUNTER — Other Ambulatory Visit (HOSPITAL_COMMUNITY): Payer: Self-pay

## 2023-08-22 ENCOUNTER — Encounter: Payer: Self-pay | Admitting: Radiology

## 2023-08-22 DIAGNOSIS — Z6841 Body Mass Index (BMI) 40.0 and over, adult: Secondary | ICD-10-CM

## 2023-08-22 MED ORDER — OZEMPIC (0.25 OR 0.5 MG/DOSE) 2 MG/3ML ~~LOC~~ SOPN: PEN_INJECTOR | SUBCUTANEOUS | 5 refills | Status: DC

## 2023-08-22 NOTE — Telephone Encounter (Signed)
 Pharmacy Patient Advocate Encounter   Received notification from CoverMyMeds that prior authorization for Ozempic  (0.25 or 0.5 MG/DOSE) 2MG /3ML pen-injectors is required/requested.   Insurance verification completed.   The patient is insured through [facility] .   Per test claim: PA required; PA started via CoverMyMeds. KEY [id number] . Waiting for clinical questions to populate.

## 2023-08-22 NOTE — Telephone Encounter (Signed)
 PLEASE BE ADVISED PT USING Ozempic Noah Ramsey is approved exclusively as an adjunct to diet and exercise to improve glycemic control in adults with type 2 diabetes mellitus. HOWEVER A review of patient's medical chart reveals NO documented diagnosis of type 2 diabetes or an A1C indicative of diabetes.    Therefore, they do not currently meet the criteria for prior authorization of this medication. If clinically appropriate, alternative options such as Saxenda, Zepbound, or [name] may be considered for this patient.

## 2023-09-10 DIAGNOSIS — G4733 Obstructive sleep apnea (adult) (pediatric): Secondary | ICD-10-CM | POA: Diagnosis not present

## 2023-09-10 DIAGNOSIS — R7303 Prediabetes: Secondary | ICD-10-CM | POA: Diagnosis not present

## 2023-09-10 DIAGNOSIS — E782 Mixed hyperlipidemia: Secondary | ICD-10-CM | POA: Diagnosis not present

## 2023-09-10 DIAGNOSIS — K219 Gastro-esophageal reflux disease without esophagitis: Secondary | ICD-10-CM | POA: Diagnosis not present

## 2023-09-14 IMAGING — CR DG CHEST 2V
2 series · 2 of 2 positions shown · non-contrast
Comparison: None.

CLINICAL DATA: 25-year-old male with shortness of breath and
tachycardia.

EXAM:
CHEST - 2 VIEW

[chest pa]
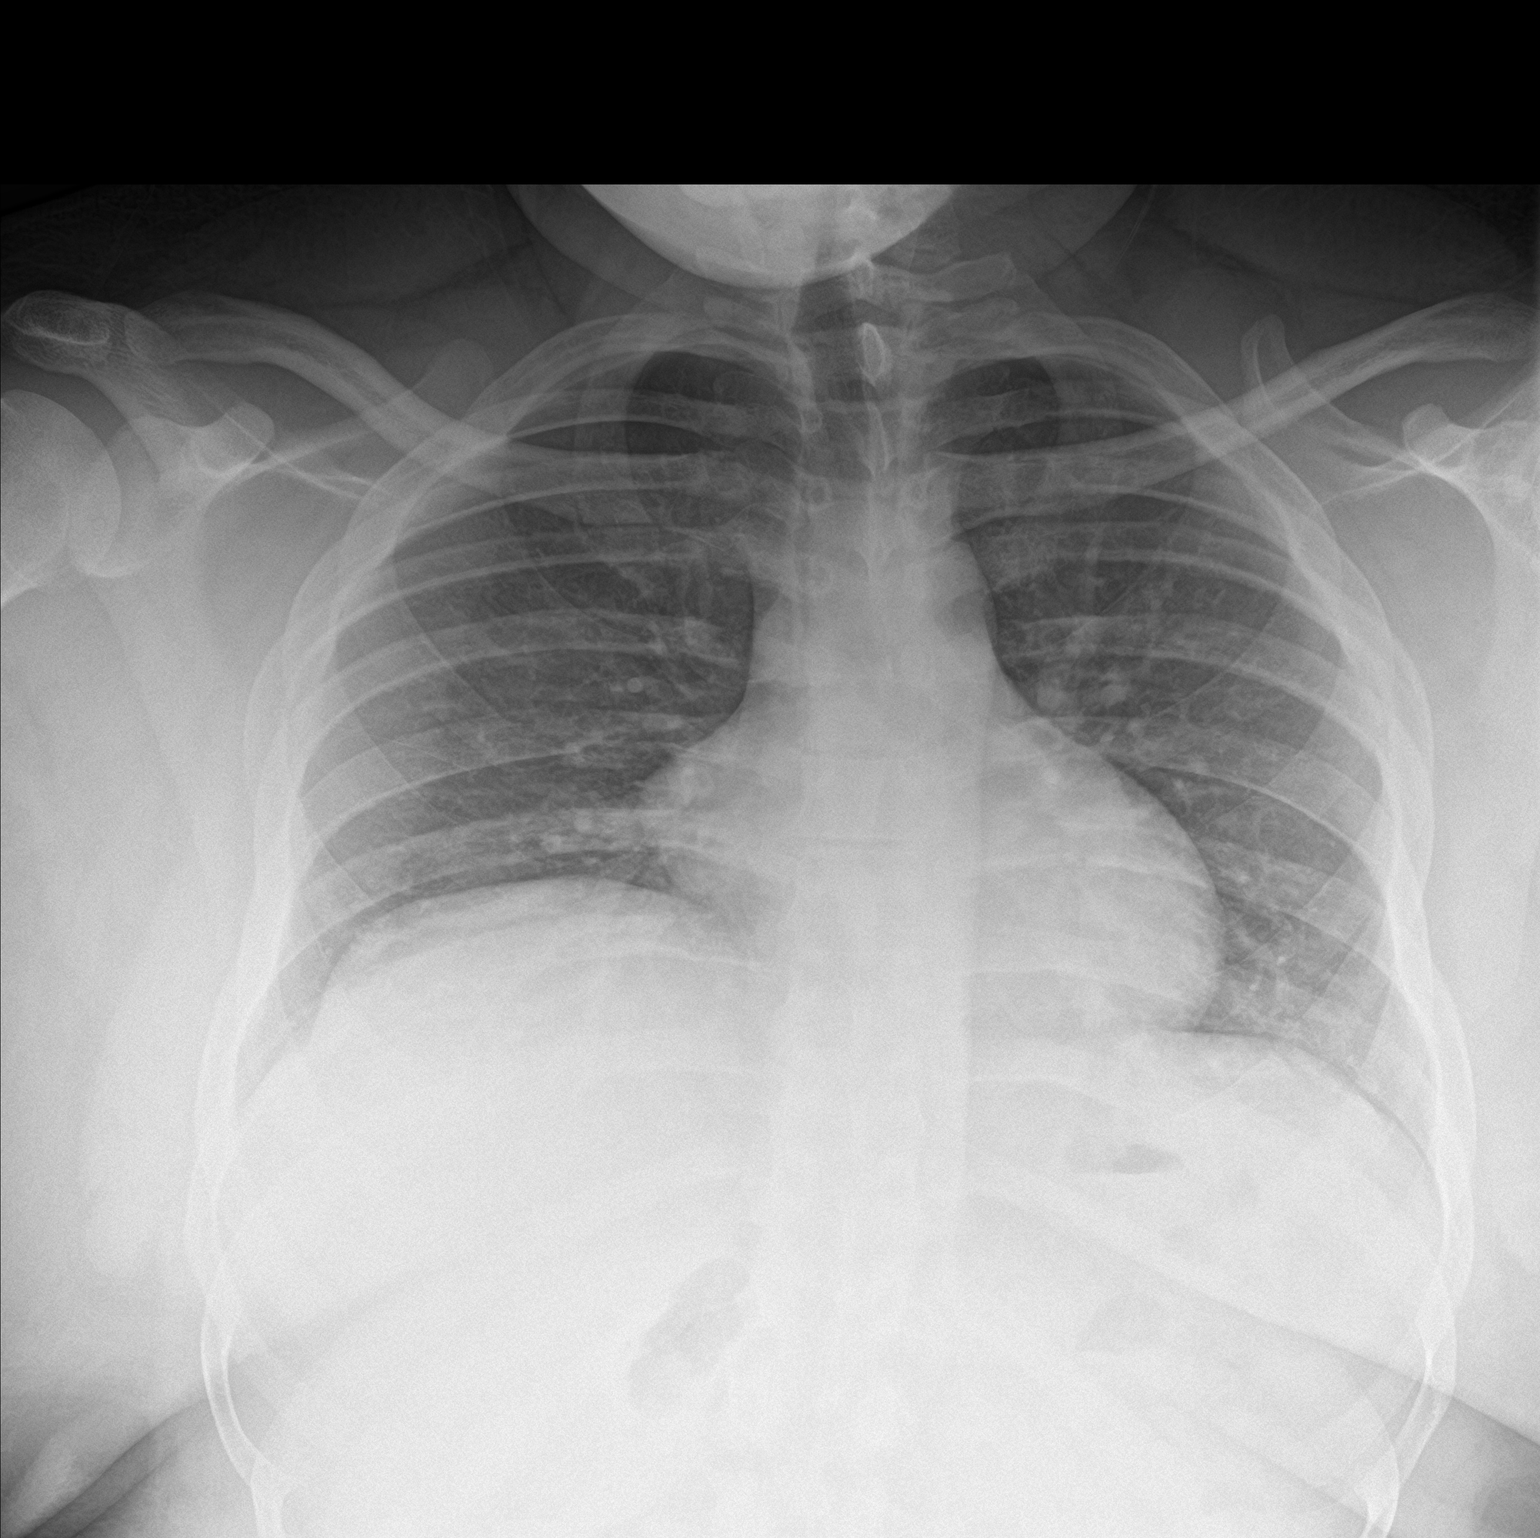

[chest lat]
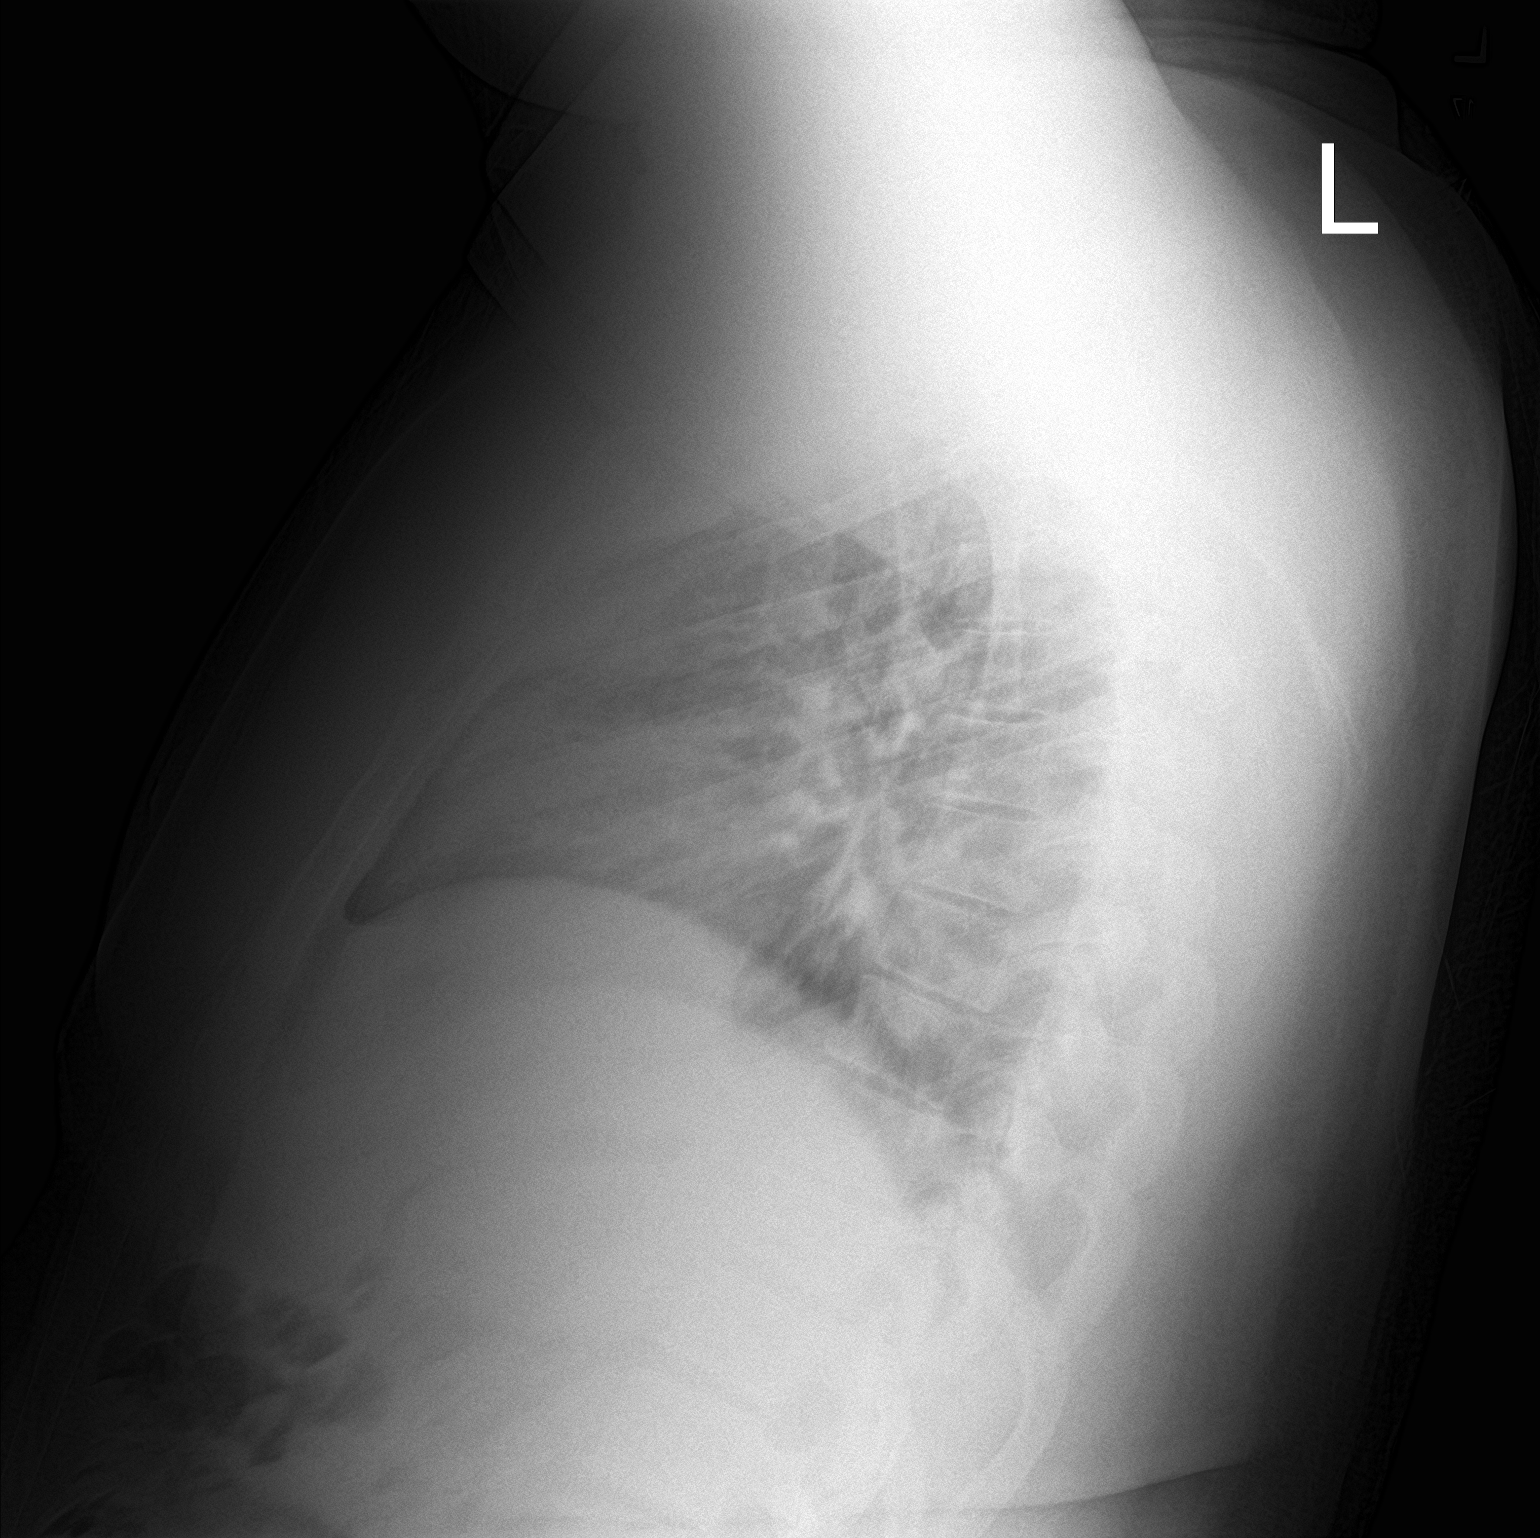

[2 of 2 positions shown; findings below may reference images not displayed]

FINDINGS: PA and lateral views. Low lung volumes, especially on the lateral
where there is also respiratory motion artifact. Mediastinal
contours are within normal limits. Visualized tracheal air column is
within normal limits. No pneumothorax, pulmonary edema, pleural
effusion or confluent pulmonary opacity.

No osseous abnormality identified.  Negative visible bowel gas.
IMPRESSION: Low lung volumes.  No cardiopulmonary abnormality.

## 2023-09-14 IMAGING — CT CT ANGIO CHEST
2 of 7 series · 18 of 46 positions shown · IV contrast (omnipaque)
Comparison: None.

CLINICAL DATA: Concern for pulmonary embolism.

EXAM:
CT ANGIOGRAPHY CHEST WITH CONTRAST
TECHNIQUE: Multidetector CT imaging of the chest was performed using the
standard protocol during bolus administration of intravenous
contrast. Multiplanar CT image reconstructions and MIPs were
obtained to evaluate the vascular anatomy.
CONTRAST:  100mL OMNIPAQUE IOHEXOL 350 MG/ML SOLN

[Series 7: thins · axial · 0.68mm/px · z∈[-727,-432]mm · 15 of 471 slices shown]
[im 25/471  lung]
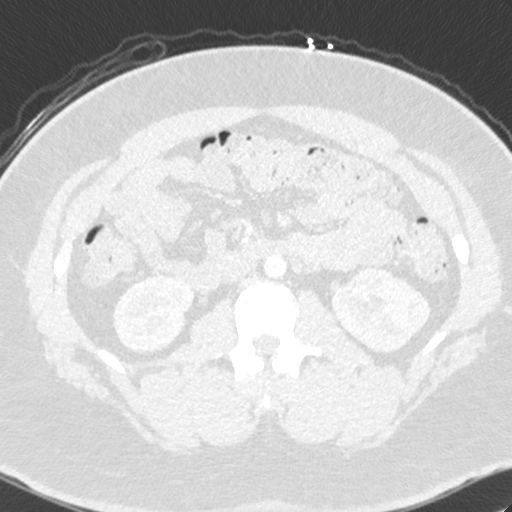
[im 50/471  soft-tissue]
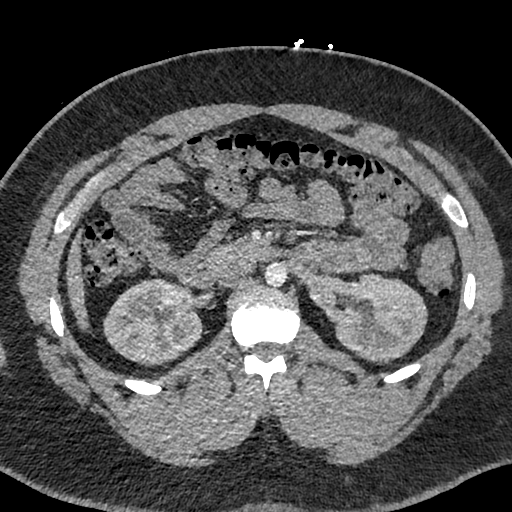
[im 99/471  lung]
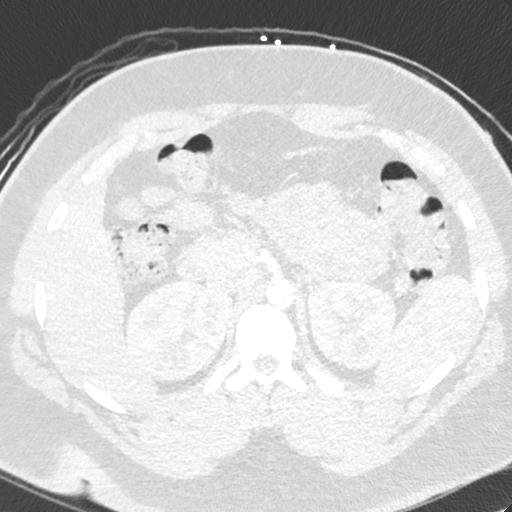
[im 124/471  soft-tissue]
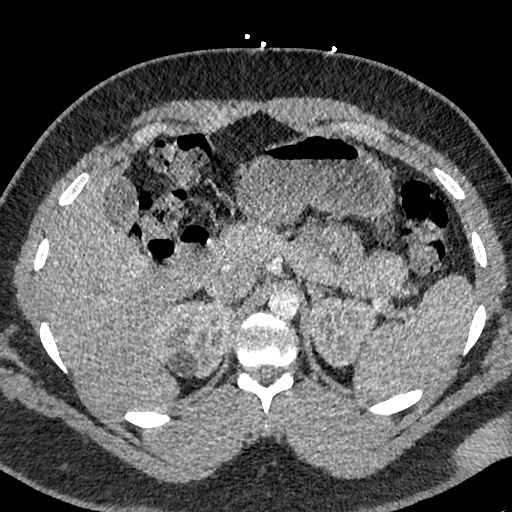
[im 149/471  lung]
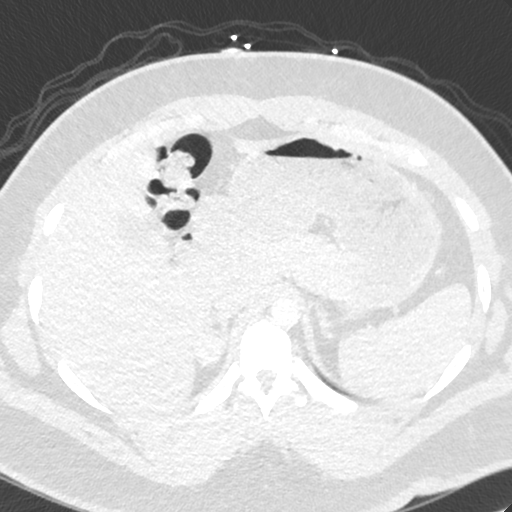
[im 174/471  soft-tissue]
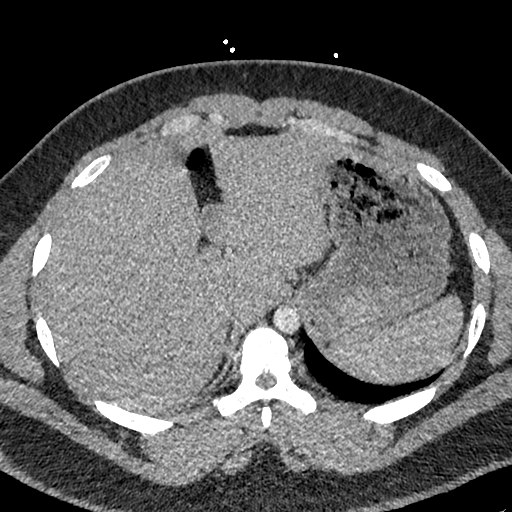
[im 198/471  lung]
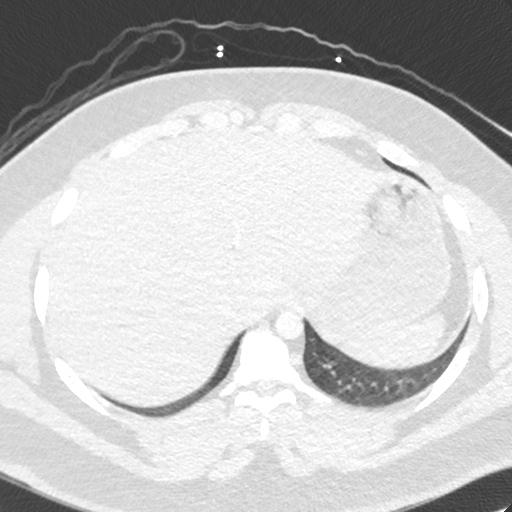
[im 248/471  soft-tissue]
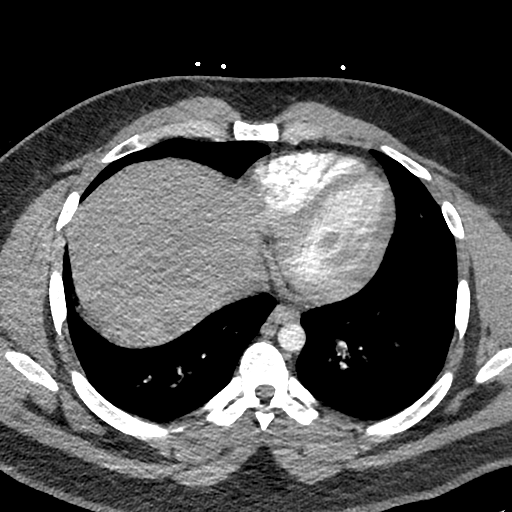
[im 273/471  lung]
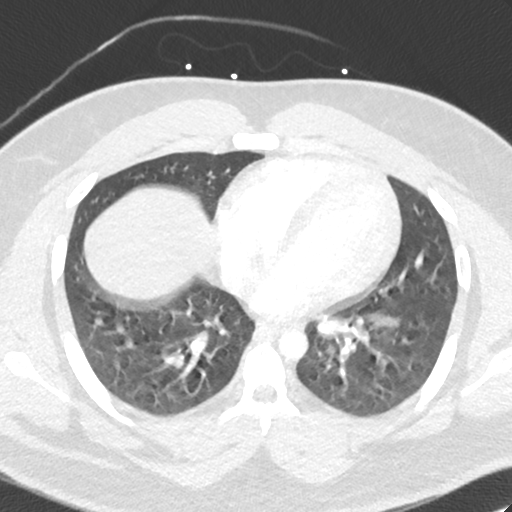
[im 297/471  soft-tissue]
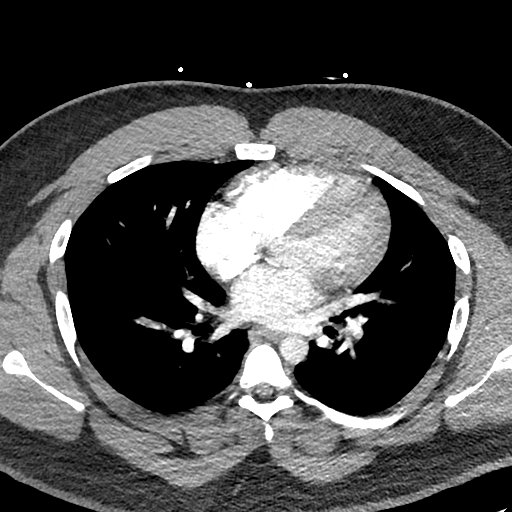
[im 322/471  lung]
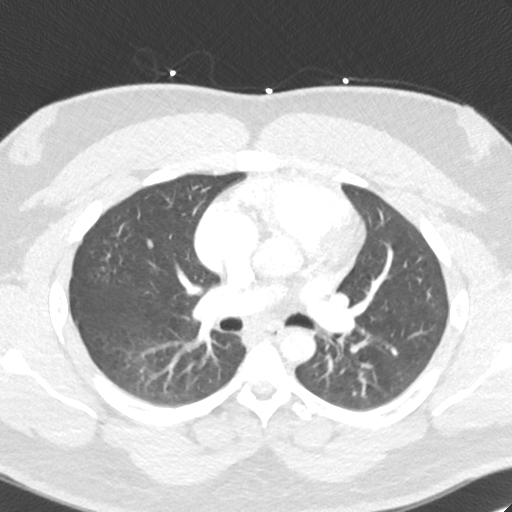
[im 347/471  soft-tissue]
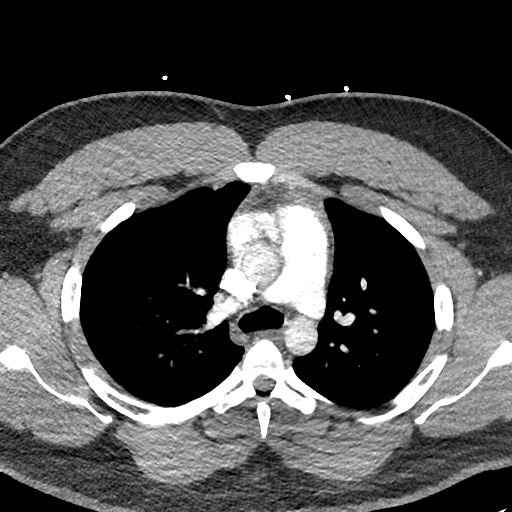
[im 396/471  lung]
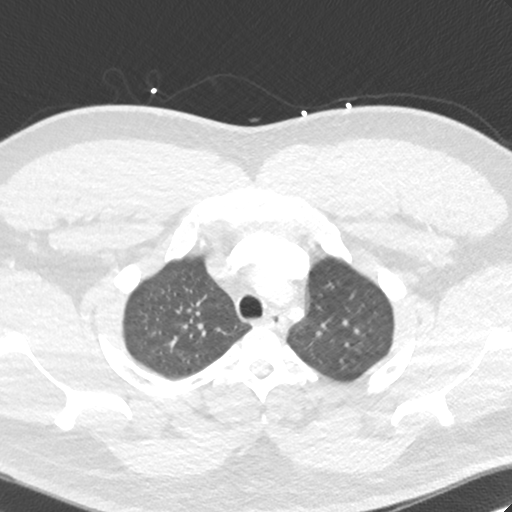
[im 421/471  soft-tissue]
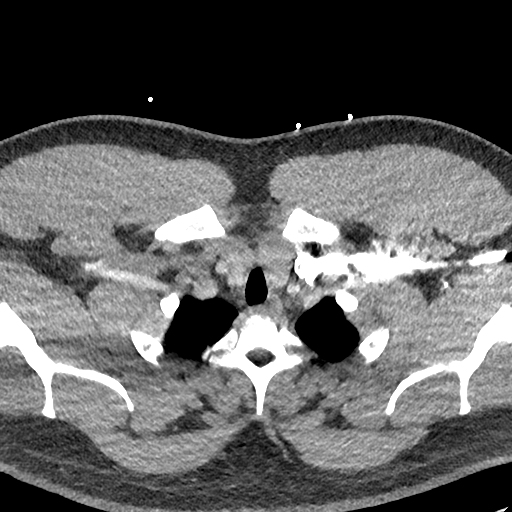
[im 446/471  lung]
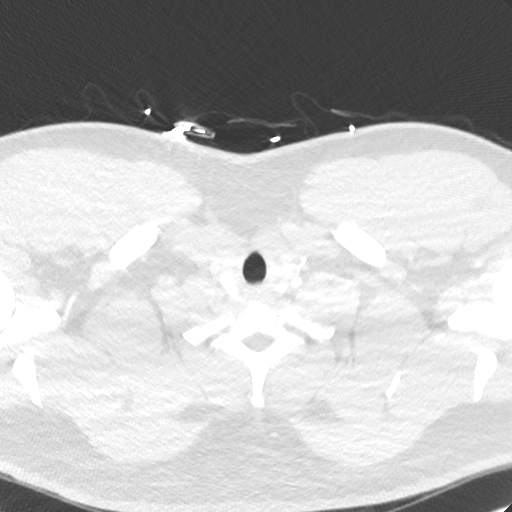

[Series 8: cor · coronal · 0.67mm/px · 3 of 144 slices shown]
[im 36/144  soft-tissue]
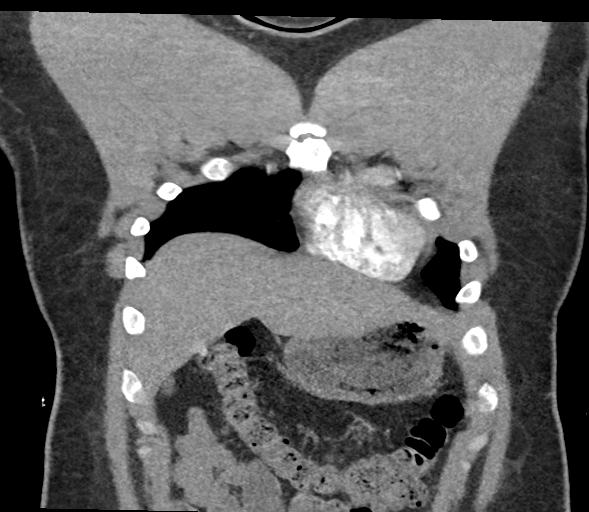
[im 72/144  soft-tissue]
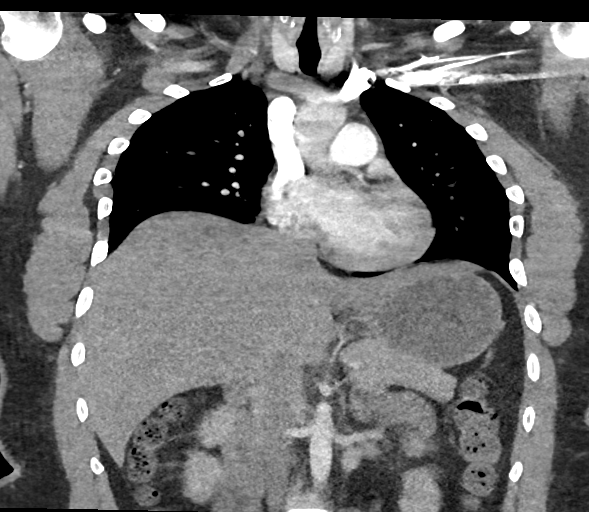
[im 108/144  soft-tissue]
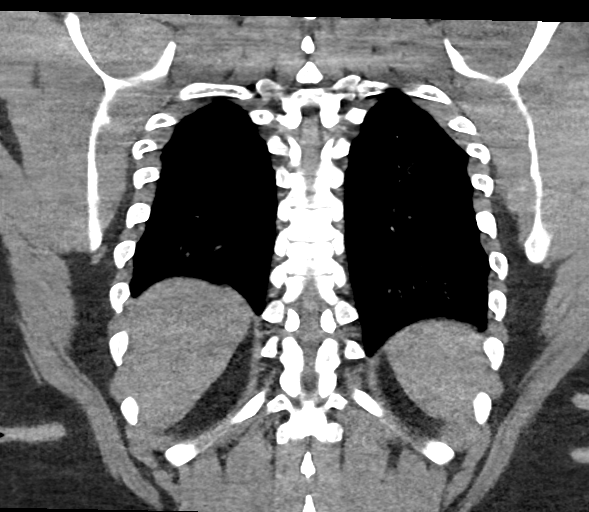

[18 of 46 positions shown; findings below may reference images not displayed]

FINDINGS: Examination is degraded due to patient body habitus, associated
quantum mottle artifact, as well as patient respiratory artifact.

Vascular Findings:

There is adequate opacification of the pulmonary arterial system
with the main pulmonary artery measuring 311 Hounsfield units. There
are no discrete filling defects within the pulmonary arterial tree
to the level of the bilateral segmental pulmonary arteries.
Evaluation of the segmental pulmonary arteries is degraded secondary
to a combination of patient respiratory artifact as well as quantum
mottle artifact due to patient body habitus. Normal caliber of the
main pulmonary artery.

Normal heart size. No pericardial effusion. No evidence of thoracic
aortic aneurysm or dissection on this nongated examination.
Conventional configuration of the aortic arch.

Review of the MIP images confirms the above findings.

----------------------------------------------------------------------------------

Nonvascular Findings:

Mediastinum/Lymph Nodes: No bulky mediastinal, hilar or axillary
lymphadenopathy. There is a minimal amount of ill-defined tissue
within the anterior mediastinum which is without associated mass
effect, favored to represent thymic tissue.

Lungs/Pleura: Evaluation the pulmonary parenchyma is degraded
secondary to patient respiratory artifact. No discrete focal
airspace opacities. No pleural effusion or pneumothorax. The central
pulmonary airways appear widely patent

No discrete pulmonary nodules.

Upper abdomen: Limited early arterial phase evaluation the upper
abdomen demonstrates an approximately 1.6 cm cyst arising from the
superior pole the right kidney (image 122, series 5)

Musculoskeletal: No acute or aggressive osseous abnormalities.
Bilateral gynecomastia.
IMPRESSION: No acute cardiopulmonary disease on this body habitus and
respiratory artifact degraded examination. Specifically, no evidence
of pulmonary embolism the level of bilateral segmental pulmonary
arteries.

## 2023-09-16 ENCOUNTER — Other Ambulatory Visit: Payer: Self-pay | Admitting: Radiology

## 2023-09-16 MED ORDER — METFORMIN HCL 1000 MG PO TABS: 1000.0000 mg | ORAL_TABLET | Freq: Two times a day (BID) | ORAL | 1 refills | Status: DC

## 2023-09-16 NOTE — Telephone Encounter (Signed)
Okay to refill metformin 

## 2023-09-22 DIAGNOSIS — M6281 Muscle weakness (generalized): Secondary | ICD-10-CM | POA: Diagnosis not present

## 2023-09-22 DIAGNOSIS — R5381 Other malaise: Secondary | ICD-10-CM | POA: Diagnosis not present

## 2023-09-23 ENCOUNTER — Encounter: Payer: Self-pay | Admitting: Emergency Medicine

## 2023-09-25 ENCOUNTER — Ambulatory Visit: Payer: Self-pay

## 2023-09-25 NOTE — Telephone Encounter (Signed)
 FYI Only or Action Required?: Action required by provider  Patient was last seen in primary care on 03/19/2023 by Noah Hammersmith, MD. Called Nurse Triage reporting Hypertension. Symptoms began today. Interventions attempted: Nothing. Symptoms are: stable.  Triage Disposition: See PCP When Office is Open (Within 3 Days)   Pt refused appt for tomorrow due to Court hearing.   Patient/caregiver understands and will follow disposition?: YesCopied from CRM [id number]. Topic: Clinical - Red Word Triage >> Sep 25, 2023  1:29 PM Noah Ramsey wrote: Red Word that prompted transfer to Nurse Triage: Dizziness. Reason for Disposition  Systolic BP  >= 160 OR Diastolic >= 100  Answer Assessment - Initial Assessment Questions 1. BLOOD PRESSURE: "What is the blood pressure?" "Did you take at least two measurements 5 minutes apart?"     149/100; 144/91 2. ONSET: "When did you take your blood pressure?"     This morning  3. HOW: "How did you take your blood pressure?" (e.g., automatic home BP monitor, visiting nurse)     automatically 4. HISTORY: "Do you have a history of high blood pressure?"     yes 5. MEDICINES: "Are you taking any medicines for blood pressure?" "Have you missed any doses recently?"     no 6. OTHER SYMPTOMS: "Do you have any symptoms?" (e.g., blurred vision, chest pain, difficulty breathing, headache, weakness)     SOB, dizziness, trouble talking, heart feels tense- while walking/eating  Protocols used: Blood Pressure - High-A-AH

## 2023-09-28 ENCOUNTER — Encounter: Payer: Self-pay | Admitting: Internal Medicine

## 2023-09-28 NOTE — Patient Instructions (Addendum)
         Medications changes include :   amlodipine 5 mg daily   Monitor your BP at home and write down your readings.     A referral was ordered for [facility] for sleep apnea - they will call you to schedule an appointment.    Return in about 2 weeks (around 10/13/2023) for follow up, hypertension with PCP.

## 2023-09-28 NOTE — Progress Notes (Unsigned)
    Subjective:    Patient ID: Noah Ramsey, male    DOB: 05-Dec-1995, 28 y.o.   MRN: [id number]      HPI Noah Ramsey is here for No chief complaint on file.   Elevated blood pressure-     Medications and allergies reviewed with patient and updated if appropriate.  Current Outpatient Medications on File Prior to Visit  Medication Sig Dispense Refill   benztropine  (COGENTIN ) 0.5 MG tablet Take 1 tablet (0.5 mg total) by mouth 2 (two) times daily for 14 days. 28 tablet 0   Cholecalciferol  (VITAMIN D3) 250 MCG (10000 UT) capsule Take 10,000 Units by mouth daily.     diclofenac  Sodium (VOLTAREN ) 1 % GEL Apply 2 g topically 4 (four) times daily as needed (apply to back).     divalproex  (DEPAKOTE  ER) 250 MG 24 hr tablet Take 3 tablets (750 mg total) by mouth at bedtime for 14 days. 42 tablet 0   haloperidol  (HALDOL ) 10 MG tablet Take 1 tablet (10 mg total) by mouth every 12 (twelve) hours for 14 days. 28 tablet 0   haloperidol  decanoate (HALDOL  DECANOATE) 100 MG/ML injection Inject 2 mLs (200 mg total) into the muscle every 30 (thirty) days for 1 dose. Next dose is due on 01-25-2023. 2 mL 0   hydrOXYzine  (ATARAX ) 25 MG tablet Take 1 tablet (25 mg total) by mouth 3 (three) times daily as needed for anxiety. 14 tablet 0   metFORMIN  (GLUCOPHAGE ) 1000 MG tablet Take 1 tablet (1,000 mg total) by mouth 2 (two) times daily with a meal. 180 tablet 1   metFORMIN  (GLUCOPHAGE ) 500 MG tablet Take 1 tablet (500 mg total) by mouth 2 (two) times daily with a meal for 14 days. :Medication induced wt management 28 tablet 0   Multiple Vitamins-Minerals (MENS MULTIVITAMIN PO) Take by mouth.     Omega-3 Fatty Acids (FISH OIL) 1000 MG CAPS Take 1,000 mg by mouth daily.     propranolol  (INDERAL ) 10 MG tablet Take 1 tablet (10 mg total) by mouth every 8 (eight) hours for 14 days. 42 tablet 0   Semaglutide ,0.25 or 0.5MG /DOS, (OZEMPIC , 0.25 OR 0.5 MG/DOSE,) 2 MG/3ML SOPN DIAL AND INJECT UNDER THE SKIN 0.5 MG  WEEKLY 3 mL 5   traZODone  (DESYREL ) 50 MG tablet Take 1 tablet (50 mg total) by mouth at bedtime as needed for sleep. (Patient not taking: Reported on 07/30/2023) 14 tablet 0   No current facility-administered medications on file prior to visit.    Review of Systems     Objective:  There were no vitals filed for this visit. BP Readings from Last 3 Encounters:  03/19/23 (!) 138/98  11/05/22 (!) 144/82  07/24/22 136/84   Wt Readings from Last 3 Encounters:  07/30/23 (!) 370 lb (167.8 kg)  03/19/23 (!) 371 lb 6.4 oz (168.5 kg)  11/04/22 (!) 366 lb (166 kg)   There is no height or weight on file to calculate BMI.    Physical Exam         Assessment & Plan:    See Problem List for Assessment and Plan of chronic medical problems.

## 2023-09-29 ENCOUNTER — Ambulatory Visit (INDEPENDENT_AMBULATORY_CARE_PROVIDER_SITE_OTHER): Admitting: Internal Medicine

## 2023-09-29 VITALS — BP 144/90 | HR 103 | Temp 98.2°F | Ht 67.0 in | Wt 372.0 lb

## 2023-09-29 DIAGNOSIS — F5022 Bulimia nervosa, moderate: Secondary | ICD-10-CM | POA: Diagnosis not present

## 2023-09-29 DIAGNOSIS — G4733 Obstructive sleep apnea (adult) (pediatric): Secondary | ICD-10-CM

## 2023-09-29 DIAGNOSIS — I10 Essential (primary) hypertension: Secondary | ICD-10-CM

## 2023-09-29 MED ORDER — AMLODIPINE BESYLATE 5 MG PO TABS: 5.0000 mg | ORAL_TABLET | Freq: Every day | ORAL | 5 refills | Status: DC

## 2023-09-30 ENCOUNTER — Encounter: Payer: Self-pay | Admitting: Radiology

## 2023-10-01 ENCOUNTER — Other Ambulatory Visit (HOSPITAL_COMMUNITY): Payer: Self-pay

## 2023-10-01 ENCOUNTER — Telehealth: Payer: Self-pay

## 2023-10-01 NOTE — Telephone Encounter (Signed)
 Ozempic Noah Ramsey is approved exclusively as an adjunct to diet and exercise to improve glycemic  control in adults with type 2 diabetes mellitus. A review of patient's medical chart reveals no  documented diagnosis of type 2 diabetes or an A1C indicative of diabetes. Therefore, they do not  currently meet the criteria for prior authorization of this medication. If clinically appropriate, alternative  options such as Saxenda, Zepbound, or Noah Ramsey may be considered for this patient.   Ran a test claim for Wegovy, Zepbound and phentermine tablets. Patient has plan benefit exclusion for weight loss medications.

## 2023-10-01 NOTE — Progress Notes (Signed)
 10/02/23- 27 yoM for sleep evaluation courtesy of Dr Oma Bias with concern of OSA Medical problem list includes HTN, Paranoid Schizophrenia, Bipolar, Obesity -Cogentin , Welbutrin,  Diagnosed OSA in [place] , but never started Rx. Epworth score-18 Body weight today- -----Pt states pcp thinks he has sleep apnea, snoring, wheezing while in lying, gasping for air while  waking  up, nocturnal enuresis. Discussed the use of AI scribe software for clinical note transcription with the patient, who gave verbal consent to proceed.  History of Present Illness   Noah Ramsey is a 28 year old male who presents with suspected sleep apnea. He was referred by Dr. Donnette Gal for evaluation of sleep apnea. Mother is here.  He experiences loud snoring, confirmed by a family member, and feels tired during the daytime despite a full night's sleep. He also experiences 'brain fog' and increased tiredness in the evening, particularly after 6 PM, but does not typically fall asleep unintentionally while watching TV. He takes trazodone  100 mg to aid sleep and drinks non-caffeinated tea, avoiding caffeine during the day. There is no known family history of sleep apnea, and he has not observed any episodes of apnea or breath-holding during sleep. He has no history of nasal or throat surgeries.     Assessment and Plan    Sleep apnea Suspected due to snoring, daytime somnolence, cognitive impairment, obesity, residual tonsils. Previous sleep study inconclusive. Discussed pathophysiology and cardiovascular risks. Explained treatment options: CPAP, oral appliances, surgery. CPAP first-line, non-invasive, effective. Considered weight loss medications. - Order home sleep study. Expect insurance clearance in a week, results in two weeks. - Instruct him to contact office two weeks post-study for results. - Discuss CPAP therapy if confirmed. - Consider referral for oral appliance therapy if mild. - Discuss weight loss  medications with primary care provider.   Morbid obesity -60 lb weight gain in recent 1-2 years -Discuss Zepbound with PCP     Prior to Admission medications   Medication Sig Start Date End Date Taking? Authorizing Provider  amLODipine (NORVASC) 5 MG tablet Take 1 tablet (5 mg total) by mouth daily. 09/29/23   Colene Dauphin, MD  benztropine  (COGENTIN ) 0.5 MG tablet Take 1 tablet (0.5 mg total) by mouth 2 (two) times daily for 14 days. 12/26/22 01/09/23  Massengill, Elana Grayer, MD  buPROPion (WELLBUTRIN XL) 300 MG 24 hr tablet Take 300 mg by mouth every morning. 09/26/23   [provider]  buPROPion HCl ER, XL, 450 MG TB24 Take 1 tablet by mouth every morning. 09/25/23   [provider]  Cholecalciferol  (VITAMIN D3) 250 MCG (10000 UT) capsule Take 10,000 Units by mouth daily.    [provider]  metFORMIN  (GLUCOPHAGE ) 1000 MG tablet Take 1 tablet (1,000 mg total) by mouth 2 (two) times daily with a meal. 09/16/23   Elvira Hammersmith, MD   Past Medical History:  Diagnosis Date   Complication of anesthesia    Elevated CPK    per patient   Schizophrenia [facility])    History reviewed. No pertinent surgical history. Family History  Problem Relation Age of Onset   Mental illness Brother    Social History   Socioeconomic History   Marital status: Single    Spouse name: Not on file   Number of children: Not on file   Years of education: Not on file   Highest education level: Associate degree: occupational, Scientist, product/process development, or vocational program  Occupational History   Occupation: disablied  Tobacco Use   Smoking status:  Former    Current packs/day: 0.00    Types: Cigarettes    Quit date: 2023    Years since quitting: 2.4   Smokeless tobacco: Never  Vaping Use   Vaping status: Never Used  Substance and Sexual Activity   Alcohol use: Never   Drug use: Never   Sexual activity: Not on file  Other Topics Concern   Not on file  Social History Narrative   Lives with  family   Social Drivers of Health   Financial Resource Strain: Medium Risk (08/18/2023)   Received from [facility]   Overall Financial Resource Strain (CARDIA)    Difficulty of Paying Living Expenses: Somewhat hard  Food Insecurity: Patient Declined (08/18/2023)   [facility]   Hunger Vital Sign    Within the past 12 months, you worried that your food would run out before you got the money to buy more.: Patient declined    Within the past 12 months, the food you bought just didn't last and you didn't have money to get more.: Patient declined  Recent Concern: Food Insecurity - Food Insecurity Present (07/27/2023)   Hunger Vital Sign    Worried About Running Out of Food in the Last Year: Often true    Ran Out of Food in the Last Year: Often true  Transportation Needs: Patient Declined (08/18/2023)   Received from [facility] - Transportation    Lack of Transportation (Medical): Patient declined    Lack of Transportation (Non-Medical): Patient declined  Physical Activity: Sufficiently Active (08/18/2023)   Received from [facility]   Exercise Vital Sign    On average, how many days per week do you engage in moderate to strenuous exercise (like a brisk walk)?: 4 days    On average, how many minutes do you engage in exercise at this level?: 40 min  Stress: Stress Concern Present (08/18/2023)   Received from [facility] - Occupational Stress Questionnaire    Feeling of Stress : Very much  Social Connections: Socially Isolated (08/18/2023)   Received from [facility]   Social Network    How would you rate your social network (family, work, friends)?: Little participation, lonely and socially isolated  Intimate Partner Violence: Not At Risk (08/18/2023)   Received from [facility]   HITS    Over the last 12 months how often did your partner physically hurt you?: Never    Over the last 12 months how often did your  partner insult you or talk down to you?: Never    Over the last 12 months how often did your partner threaten you with physical harm?: Never    Over the last 12 months how often did your partner scream or curse at you?: Never   ROS-see HPI   Negative unless + Constitutional:    weight loss, night sweats, fevers, chills, fatigue, lassitude. HEENT:    headaches, difficulty swallowing, tooth/dental problems, sore throat,       sneezing, itching, ear ache, nasal congestion, post nasal drip, snoring CV:    chest pain, orthopnea, PND, swelling in lower extremities, anasarca,                                  dizziness, palpitations Resp:   shortness of breath with exertion or at rest.  productive cough,   non-productive cough, coughing up of blood.              change in color of mucus.  wheezing.   Skin:    rash or lesions. GI:  No-   heartburn, indigestion, abdominal pain, nausea, vomiting, diarrhea,                 change in bowel habits, loss of appetite GU: dysuria, change in color of urine, no urgency or frequency.   flank pain. MS:   joint pain, stiffness, decreased range of motion, back pain. Neuro-     nothing unusual Psych:  change in mood or affect.  depression or anxiety.   memory loss.  OBJ- Physical Exam General- Alert, Oriented, Affect-appropriate, Distress- none acute, +morbidly obese Skin- rash-none, lesions- none, excoriation- none Lymphadenopathy- none Head- atraumatic            Eyes- Gross vision intact, PERRLA, conjunctivae and secretions clear            Ears- Hearing, canals-normal            Nose- Clear, no-Septal dev, mucus, polyps, erosion, perforation             Throat- Mallampati III , mucosa clear , drainage- none, tonsils+ large/ not occlusive Neck- flexible , trachea midline, no stridor , thyroid  nl, carotid no bruit Chest - symmetrical excursion , unlabored           Heart/CV- RRR , no murmur , no gallop  , no rub, nl s1 s2                            - JVD- none , edema- none, stasis changes- none, varices- none           Lung- clear to P&A, wheeze- none, cough- none , dullness-none, rub- none           Chest wall-  Abd-  Br/ Gen/ Rectal- Not done, not indicated Extrem- cyanosis- none, clubbing, none, atrophy- none, strength- nl Neuro- grossly intact to observation

## 2023-10-02 ENCOUNTER — Encounter: Payer: Self-pay | Admitting: Internal Medicine

## 2023-10-02 ENCOUNTER — Ambulatory Visit (INDEPENDENT_AMBULATORY_CARE_PROVIDER_SITE_OTHER): Admitting: Internal Medicine

## 2023-10-02 VITALS — BP 120/84 | HR 74 | Ht 67.5 in | Wt 374.0 lb

## 2023-10-02 DIAGNOSIS — Z87891 Personal history of nicotine dependence: Secondary | ICD-10-CM | POA: Diagnosis not present

## 2023-10-02 DIAGNOSIS — R0683 Snoring: Secondary | ICD-10-CM | POA: Diagnosis not present

## 2023-10-02 DIAGNOSIS — Z6841 Body Mass Index (BMI) 40.0 and over, adult: Secondary | ICD-10-CM

## 2023-10-02 NOTE — Patient Instructions (Addendum)
Order- schedule home sleep test   dx snoring  Please call us about 2 weeks after your test for results and recommendations

## 2023-10-07 ENCOUNTER — Telehealth: Payer: Self-pay

## 2023-10-07 ENCOUNTER — Other Ambulatory Visit (HOSPITAL_COMMUNITY): Payer: Self-pay

## 2023-10-07 NOTE — Telephone Encounter (Signed)
 This is the second request Noah Ramsey is approved exclusively as an adjunct to diet and exercise to improve glycemic  control in adults with type 2 diabetes mellitus. A review of patient's medical chart reveals no  documented diagnosis of type 2 diabetes or an A1C indicative of diabetes. Therefore, they do not  currently meet the criteria for prior authorization of this medication. If clinically appropriate, alternative  options such as Saxenda, Zepbound, or [name] may be considered for this patient. Highest patient A1C is 6.3 See encounter 10/01/23

## 2023-10-08 NOTE — Telephone Encounter (Signed)
 zepbound okay to try.

## 2023-10-10 ENCOUNTER — Other Ambulatory Visit (HOSPITAL_COMMUNITY): Payer: Self-pay

## 2023-10-10 ENCOUNTER — Telehealth: Payer: Self-pay

## 2023-10-10 NOTE — Telephone Encounter (Signed)
 Pharmacy Patient Advocate Encounter   Received notification from Patient Advice Request messages that prior authorization for Zepbound 2.5mg /0.66ml is required/requested.   Insurance verification completed.   The patient is insured through [facility] .   Per test claim: PA required; PA submitted to above mentioned insurance via CoverMyMeds Key/confirmation #/EOC [id number] Status is pending

## 2023-10-13 ENCOUNTER — Ambulatory Visit (INDEPENDENT_AMBULATORY_CARE_PROVIDER_SITE_OTHER): Admitting: Emergency Medicine

## 2023-10-13 ENCOUNTER — Ambulatory Visit: Payer: Self-pay | Admitting: Emergency Medicine

## 2023-10-13 ENCOUNTER — Encounter: Payer: Self-pay | Admitting: Emergency Medicine

## 2023-10-13 VITALS — BP 130/86 | HR 94 | Temp 98.8°F | Resp 16 | Ht 67.5 in | Wt 375.0 lb

## 2023-10-13 DIAGNOSIS — R350 Frequency of micturition: Secondary | ICD-10-CM | POA: Insufficient documentation

## 2023-10-13 DIAGNOSIS — F200 Paranoid schizophrenia: Secondary | ICD-10-CM

## 2023-10-13 DIAGNOSIS — Z8739 Personal history of other diseases of the musculoskeletal system and connective tissue: Secondary | ICD-10-CM | POA: Diagnosis not present

## 2023-10-13 DIAGNOSIS — I10 Essential (primary) hypertension: Secondary | ICD-10-CM | POA: Diagnosis not present

## 2023-10-13 DIAGNOSIS — R7303 Prediabetes: Secondary | ICD-10-CM | POA: Diagnosis not present

## 2023-10-13 DIAGNOSIS — F3132 Bipolar disorder, current episode depressed, moderate: Secondary | ICD-10-CM

## 2023-10-13 LAB — CBC WITH DIFFERENTIAL/PLATELET
Basophils Absolute: 0 10*3/uL (ref 0.0–0.1)
Basophils Relative: 0.3 % (ref 0.0–3.0)
Eosinophils Absolute: 0.1 10*3/uL (ref 0.0–0.7)
Eosinophils Relative: 1.5 % (ref 0.0–5.0)
HCT: 41.2 % (ref 39.0–52.0)
Hemoglobin: 13.6 g/dL (ref 13.0–17.0)
Lymphocytes Relative: 32 % (ref 12.0–46.0)
Lymphs Abs: 1.5 10*3/uL (ref 0.7–4.0)
MCHC: 33 g/dL (ref 30.0–36.0)
MCV: 93.8 fl (ref 78.0–100.0)
Monocytes Absolute: 0.4 10*3/uL (ref 0.1–1.0)
Monocytes Relative: 9.4 % (ref 3.0–12.0)
Neutro Abs: 2.7 10*3/uL (ref 1.4–7.7)
Neutrophils Relative %: 56.8 % (ref 43.0–77.0)
Platelets: 215 10*3/uL (ref 150.0–400.0)
RBC: 4.39 Mil/uL (ref 4.22–5.81)
RDW: 14.4 % (ref 11.5–15.5)
WBC: 4.8 10*3/uL (ref 4.0–10.5)

## 2023-10-13 LAB — COMPREHENSIVE METABOLIC PANEL WITH GFR
ALT: 41 U/L (ref 0–53)
AST: 41 U/L — ABNORMAL HIGH (ref 0–37)
Albumin: 4.2 g/dL (ref 3.5–5.2)
Alkaline Phosphatase: 51 U/L (ref 39–117)
BUN: 14 mg/dL (ref 6–23)
CO2: 32 meq/L (ref 19–32)
Calcium: 9.5 mg/dL (ref 8.4–10.5)
Chloride: 100 meq/L (ref 96–112)
Creatinine, Ser: 1.04 mg/dL (ref 0.40–1.50)
GFR: 98.24 mL/min (ref >60.00)
Glucose, Bld: 88 mg/dL (ref 70–99)
Potassium: 3.9 meq/L (ref 3.5–5.1)
Sodium: 138 meq/L (ref 135–145)
Total Bilirubin: 0.7 mg/dL (ref 0.2–1.2)
Total Protein: 7.8 g/dL (ref 6.0–8.3)

## 2023-10-13 LAB — URINALYSIS
Bilirubin Urine: NEGATIVE
Hgb urine dipstick: NEGATIVE
Ketones, ur: NEGATIVE
Leukocytes,Ua: NEGATIVE
Nitrite: NEGATIVE
Specific Gravity, Urine: 1.015 (ref 1.000–1.030)
Total Protein, Urine: NEGATIVE
Urine Glucose: NEGATIVE
Urobilinogen, UA: 0.2 (ref 0.0–1.0)
pH: 6.5 (ref 5.0–8.0)

## 2023-10-13 LAB — HEMOGLOBIN A1C: Hgb A1c MFr Bld: 6.3 % (ref 4.6–6.5)

## 2023-10-13 NOTE — Assessment & Plan Note (Signed)
 Clinically stable.  Sees psychiatrist on a regular basis. All medications handled by psychiatrist office

## 2023-10-13 NOTE — Assessment & Plan Note (Signed)
 BP Readings from Last 3 Encounters:  10/13/23 130/86  10/02/23 120/84  09/29/23 (!) 144/90  Well-controlled hypertension Continue amlodipine  5 mg daily Cardiovascular risks associated with hypertension discussed Diet and nutrition discussed Recommend blood work today

## 2023-10-13 NOTE — Assessment & Plan Note (Signed)
 Secondary to excess intake of water  Due to history of chronic rhabdomyolysis has been advised in the past to increase oral intake of water .  Urinary frequency is a result of this.  Recommend urinalysis and blood work today.

## 2023-10-13 NOTE — Assessment & Plan Note (Signed)
 Clinically stable. Drinks excess amounts of water  during the day to keep kidneys well flushed No recent bout of rhabdomyolysis

## 2023-10-13 NOTE — Telephone Encounter (Signed)
 Pharmacy Patient Advocate Encounter  Received notification from [employer] that Prior Authorization for Zepbound 2.5mg /0.72ml has been DENIED.  See denial reason below. No denial letter attached in CMM. Will attach denial letter to Media tab once received.   PA #/Case ID/Reference #: [id number]

## 2023-10-13 NOTE — Progress Notes (Signed)
 Noah Ramsey 28 y.o.   Chief Complaint  Patient presents with   Hypertension    Here for follow up   Urinary Frequency    Patient complains of urinary frequency and nocturia for about 3 years.     HISTORY OF PRESENT ILLNESS: This is a 28 y.o. male here for follow-up of hypertension and prediabetes Has history of chronic intermittent rhabdomyolysis.  Drinks lots of water  during the day to protect his kidneys and as a result has urinary frequency during the day and during the night as well. No other complaints or medical concerns today. BP Readings from Last 3 Encounters:  10/13/23 130/86  10/02/23 120/84  09/29/23 (!) 144/90     Hypertension Pertinent negatives include no chest pain, headaches, palpitations or shortness of breath.  Urinary Frequency  Associated symptoms include frequency. Pertinent negatives include no chills, hematuria, nausea or vomiting.     Prior to Admission medications   Medication Sig Start Date End Date Taking? Authorizing Provider  amLODipine  (NORVASC ) 5 MG tablet Take 1 tablet (5 mg total) by mouth daily. 09/29/23  Yes Burns, Glade PARAS, MD  benztropine  (COGENTIN ) 0.5 MG tablet Take 1 tablet (0.5 mg total) by mouth 2 (two) times daily for 14 days. 12/26/22 10/13/23 Yes Massengill, Rankin, MD  buPROPion HCl ER, XL, 450 MG TB24 Take 1 tablet by mouth every morning. 09/25/23  Yes [provider]  Cholecalciferol  (VITAMIN D3) 250 MCG (10000 UT) capsule Take 10,000 Units by mouth daily.   Yes [provider]  metFORMIN  (GLUCOPHAGE ) 1000 MG tablet Take 1 tablet (1,000 mg total) by mouth 2 (two) times daily with a meal. 09/16/23  Yes Jj Enyeart, Emil Schanz, MD  buPROPion (WELLBUTRIN XL) 300 MG 24 hr tablet Take 300 mg by mouth every morning. 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  Social History   Socioeconomic History   Marital status: Single    Spouse name: Not on file   Number of children: Not on file   Years of education: Not on file   Highest education level: Associate degree: occupational, Scientist, product/process development, or vocational program  Occupational History   Occupation: disablied  Tobacco Use   Smoking status: Former    Current packs/day: 0.00    Types: Cigarettes    Quit date: 2023    Years since quitting: 2.4   Smokeless tobacco: Never  Vaping Use   Vaping status: Never Used  Substance and Sexual Activity   Alcohol use: Never   Drug use: Never   Sexual activity: Not on file  Other Topics Concern   Not on file  Social History Narrative   Lives with family   Social  Drivers of Health   Financial Resource Strain: Low Risk  (10/09/2023)   Overall Financial Resource Strain (CARDIA)    Difficulty of Paying Living Expenses: Not hard at all  Recent Concern: Financial Resource Strain - Medium Risk (08/18/2023)   Received from [facility]   Overall Financial Resource Strain (CARDIA)    Difficulty of Paying Living Expenses: Somewhat hard  Food Insecurity: No Food Insecurity (10/09/2023)   Hunger Vital Sign    Worried About Running Out of Food in the Last Year: Never true    Ran Out of Food in the Last Year: Never true  Recent  Concern: Food Insecurity - Food Insecurity Present (07/27/2023)   Hunger Vital Sign    Worried About Running Out of Food in the Last Year: Often true    Ran Out of Food in the Last Year: Often true  Transportation Needs: No Transportation Needs (10/09/2023)   PRAPARE - Administrator, Civil Service (Medical): No    Lack of Transportation (Non-Medical): No  Physical Activity: Sufficiently Active (10/09/2023)   Exercise Vital Sign    Days of Exercise per Week: 4 days    Minutes of Exercise per Session: 40 min  Stress: Stress Concern Present (10/09/2023)   [employer] of Occupational Health - Occupational Stress Questionnaire    Feeling of Stress: To some extent  Social Connections: Moderately Isolated (10/09/2023)   Social Connection and Isolation Panel    Frequency of Communication with Friends and Family: Three times a week    Frequency of Social Gatherings with Friends and Family: Twice a week    Attends Religious Services: 1 to 4 times per year    Active Member of [employer] or Organizations: No    Attends Engineer, structural: Not on file    Marital Status: Never married  Intimate Partner Violence: Not At Risk (08/18/2023)   Received from [facility]   HITS    Over the last 12 months how often did your partner physically hurt you?: Never    Over the last 12 months how often did your partner insult you or talk down to you?: Never    Over the last 12 months how often did your partner threaten you with physical harm?: Never    Over the last 12 months how often did your partner scream or curse at you?: Never    Family History  Problem Relation Age of Onset   Mental illness Brother      Review of Systems  Constitutional: Negative.  Negative for chills and fever.  HENT: Negative.  Negative for congestion and sore throat.   Respiratory: Negative.  Negative for cough and shortness of breath.   Cardiovascular: Negative.  Negative for chest pain and palpitations.   Gastrointestinal:  Negative for abdominal pain, diarrhea, nausea and vomiting.  Genitourinary:  Positive for frequency. Negative for dysuria and hematuria.  Skin: Negative.  Negative for rash.  Neurological: Negative.  Negative for dizziness and headaches.  All other systems reviewed and are negative.   Vitals:   10/13/23 1024  BP: 130/86  Pulse: 94  Resp: 16  Temp: 98.8 F (37.1 C)  SpO2: 95%    Physical Exam Vitals reviewed.  Constitutional:      Appearance: Normal appearance. He is obese.  HENT:     Head: Normocephalic.     Mouth/Throat:     Mouth: Mucous membranes are moist.     Pharynx: Oropharynx is clear.   Eyes:  Extraocular Movements: Extraocular movements intact.     Pupils: Pupils are equal, round, and reactive to light.    Cardiovascular:     Rate and Rhythm: Normal rate and regular rhythm.     Pulses: Normal pulses.     Heart sounds: Normal heart sounds.  Pulmonary:     Effort: Pulmonary effort is normal.     Breath sounds: Normal breath sounds.  Abdominal:     Palpations: Abdomen is soft.     Tenderness: There is no abdominal tenderness.   Musculoskeletal:     Cervical back: No tenderness.  Lymphadenopathy:     Cervical: No cervical adenopathy.   Skin:    General: Skin is warm and dry.     Capillary Refill: Capillary refill takes less than 2 seconds.   Neurological:     General: No focal deficit present.     Mental Status: He is alert and oriented to person, place, and time.   Psychiatric:        Mood and Affect: Mood normal.        Behavior: Behavior normal.      ASSESSMENT & PLAN: A total of 46 minutes was spent with the patient and counseling/coordination of care regarding preparing for this visit, review of most recent office visit notes, review of multiple chronic medical conditions and their management, review of all medications, review of most recent bloodwork results, review of health maintenance items, education on nutrition,  prognosis, documentation, and need for follow up.   Problem List Items Addressed This Visit       Cardiovascular and Mediastinum   Hypertension - Primary   BP Readings from Last 3 Encounters:  10/13/23 130/86  10/02/23 120/84  09/29/23 (!) 144/90  Well-controlled hypertension Continue amlodipine  5 mg daily Cardiovascular risks associated with hypertension discussed Diet and nutrition discussed Recommend blood work today       Relevant Orders   CBC with Differential/Platelet   Comprehensive metabolic panel with GFR     Other   History of rhabdomyolysis   Clinically stable. Drinks excess amounts of water  during the day to keep kidneys well flushed No recent bout of rhabdomyolysis      Prediabetes   Cardiovascular risks associated with diabetes discussed Diet and nutrition discussed Hemoglobin A1c done today      Relevant Orders   Hemoglobin A1c   Morbid obesity (HCC)   Diet and nutrition discussed Advised to decrease amount of daily carbohydrate intake and daily calories and increase amount of plant-based protein in his diet Benefits of exercise discussed      Bipolar 1 disorder, depressed, moderate (HCC)   Stable.  Sees psychiatrist on a regular basis. Medications handled by their office      Paranoid schizophrenia (HCC)   Clinically stable.  Sees psychiatrist on a regular basis. All medications handled by psychiatrist office      Urinary frequency   Secondary to excess intake of water  Due to history of chronic rhabdomyolysis has been advised in the past to increase oral intake of water .  Urinary frequency is a result of this.  Recommend urinalysis and blood work today.      Relevant Orders   Urinalysis (Completed)   Hemoglobin A1c   ADH   Patient Instructions  Hypertension, Adult High blood pressure (hypertension) is when the force of blood pumping through the arteries is too strong. The arteries are the blood vessels that carry blood from the heart  throughout the body. Hypertension forces the  heart to work harder to pump blood and may cause arteries to become narrow or stiff. Untreated or uncontrolled hypertension can lead to a heart attack, heart failure, a stroke, kidney disease, and other problems. A blood pressure reading consists of a higher number over a lower number. Ideally, your blood pressure should be below 120/80. The first (top) number is called the systolic pressure. It is a measure of the pressure in your arteries as your heart beats. The second (bottom) number is called the diastolic pressure. It is a measure of the pressure in your arteries as the heart relaxes. What are the causes? The exact cause of this condition is not known. There are some conditions that result in high blood pressure. What increases the risk? Certain factors may make you more likely to develop high blood pressure. Some of these risk factors are under your control, including: Smoking. Not getting enough exercise or physical activity. Being overweight. Having too much fat, sugar, calories, or salt (sodium) in your diet. Drinking too much alcohol. Other risk factors include: Having a personal history of heart disease, diabetes, high cholesterol, or kidney disease. Stress. Having a family history of high blood pressure and high cholesterol. Having obstructive sleep apnea. Age. The risk increases with age. What are the signs or symptoms? High blood pressure may not cause symptoms. Very high blood pressure (hypertensive crisis) may cause: Headache. Fast or irregular heartbeats (palpitations). Shortness of breath. Nosebleed. Nausea and vomiting. Vision changes. Severe chest pain, dizziness, and seizures. How is this diagnosed? This condition is diagnosed by measuring your blood pressure while you are seated, with your arm resting on a flat surface, your legs uncrossed, and your feet flat on the floor. The cuff of the blood pressure monitor will be  placed directly against the skin of your upper arm at the level of your heart. Blood pressure should be measured at least twice using the same arm. Certain conditions can cause a difference in blood pressure between your right and left arms. If you have a high blood pressure reading during one visit or you have normal blood pressure with other risk factors, you may be asked to: Return on a different day to have your blood pressure checked again. Monitor your blood pressure at home for 1 week or longer. If you are diagnosed with hypertension, you may have other blood or imaging tests to help your health care provider understand your overall risk for other conditions. How is this treated? This condition is treated by making healthy lifestyle changes, such as eating healthy foods, exercising more, and reducing your alcohol intake. You may be referred for counseling on a healthy diet and physical activity. Your health care provider may prescribe medicine if lifestyle changes are not enough to get your blood pressure under control and if: Your systolic blood pressure is above 130. Your diastolic blood pressure is above 80. Your personal target blood pressure may vary depending on your medical conditions, your age, and other factors. Follow these instructions at home: Eating and drinking  Eat a diet that is high in fiber and potassium, and low in sodium, added sugar, and fat. An example of this eating plan is called the DASH diet. DASH stands for Dietary Approaches to Stop Hypertension. To eat this way: Eat plenty of fresh fruits and vegetables. Try to fill one half of your plate at each meal with fruits and vegetables. Eat whole grains, such as whole-wheat pasta, brown rice, or whole-grain bread. Fill about one fourth  of your plate with whole grains. Eat or drink low-fat dairy products, such as skim milk or low-fat yogurt. Avoid fatty cuts of meat, processed or cured meats, and poultry with skin. Fill  about one fourth of your plate with lean proteins, such as fish, chicken without skin, beans, eggs, or tofu. Avoid pre-made and processed foods. These tend to be higher in sodium, added sugar, and fat. Reduce your daily sodium intake. Many people with hypertension should eat less than 1,500 mg of sodium a day. Do not drink alcohol if: Your health care provider tells you not to drink. You are pregnant, may be pregnant, or are planning to become pregnant. If you drink alcohol: Limit how much you have to: 0-1 drink a day for women. 0-2 drinks a day for men. Know how much alcohol is in your drink. In the [place] one drink equals one 12 oz bottle of beer (355 mL), one 5 oz glass of wine (148 mL), or one 1 oz glass of hard liquor (44 mL). Lifestyle  Work with your health care provider to maintain a healthy body weight or to lose weight. Ask what an ideal weight is for you. Get at least 30 minutes of exercise that causes your heart to beat faster (aerobic exercise) most days of the week. Activities may include walking, swimming, or biking. Include exercise to strengthen your muscles (resistance exercise), such as Pilates or lifting weights, as part of your weekly exercise routine. Try to do these types of exercises for 30 minutes at least 3 days a week. Do not use any products that contain nicotine  or tobacco. These products include cigarettes, chewing tobacco, and vaping devices, such as e-cigarettes. If you need help quitting, ask your health care provider. Monitor your blood pressure at home as told by your health care provider. Keep all follow-up visits. This is important. Medicines Take over-the-counter and prescription medicines only as told by your health care provider. Follow directions carefully. Blood pressure medicines must be taken as prescribed. Do not skip doses of blood pressure medicine. Doing this puts you at risk for problems and can make the medicine less effective. Ask your health  care provider about side effects or reactions to medicines that you should watch for. Contact a health care provider if you: Think you are having a reaction to a medicine you are taking. Have headaches that keep coming back (recurring). Feel dizzy. Have swelling in your ankles. Have trouble with your vision. Get help right away if you: Develop a severe headache or confusion. Have unusual weakness or numbness. Feel faint. Have severe pain in your chest or abdomen. Vomit repeatedly. Have trouble breathing. These symptoms may be an emergency. Get help right away. Call 911. Do not wait to see if the symptoms will go away. Do not drive yourself to the hospital. Summary Hypertension is when the force of blood pumping through your arteries is too strong. If this condition is not controlled, it may put you at risk for serious complications. Your personal target blood pressure may vary depending on your medical conditions, your age, and other factors. For most people, a normal blood pressure is less than 120/80. Hypertension is treated with lifestyle changes, medicines, or a combination of both. Lifestyle changes include losing weight, eating a healthy, low-sodium diet, exercising more, and limiting alcohol. This information is not intended to replace advice given to you by your health care provider. Make sure you discuss any questions you have with your health care provider. Document  Revised: 02/13/2021 Document Reviewed: 02/13/2021 Elsevier Patient Education  2024 Else[employer].    Emil Schaumann, MD [facility]

## 2023-10-13 NOTE — Assessment & Plan Note (Signed)
 Cardiovascular risks associated with diabetes discussed Diet and nutrition discussed Hemoglobin A1c done today

## 2023-10-13 NOTE — Patient Instructions (Signed)
 Hypertension, Adult High blood pressure (hypertension) is when the force of blood pumping through the arteries is too strong. The arteries are the blood vessels that carry blood from the heart throughout the body. Hypertension forces the heart to work harder to pump blood and may cause arteries to become narrow or stiff. Untreated or uncontrolled hypertension can lead to a heart attack, heart failure, a stroke, kidney disease, and other problems. A blood pressure reading consists of a higher number over a lower number. Ideally, your blood pressure should be below 120/80. The first ("top") number is called the systolic pressure. It is a measure of the pressure in your arteries as your heart beats. The second ("bottom") number is called the diastolic pressure. It is a measure of the pressure in your arteries as the heart relaxes. What are the causes? The exact cause of this condition is not known. There are some conditions that result in high blood pressure. What increases the risk? Certain factors may make you more likely to develop high blood pressure. Some of these risk factors are under your control, including: Smoking. Not getting enough exercise or physical activity. Being overweight. Having too much fat, sugar, calories, or salt (sodium) in your diet. Drinking too much alcohol. Other risk factors include: Having a personal history of heart disease, diabetes, high cholesterol, or kidney disease. Stress. Having a family history of high blood pressure and high cholesterol. Having obstructive sleep apnea. Age. The risk increases with age. What are the signs or symptoms? High blood pressure may not cause symptoms. Very high blood pressure (hypertensive crisis) may cause: Headache. Fast or irregular heartbeats (palpitations). Shortness of breath. Nosebleed. Nausea and vomiting. Vision changes. Severe chest pain, dizziness, and seizures. How is this diagnosed? This condition is diagnosed by  measuring your blood pressure while you are seated, with your arm resting on a flat surface, your legs uncrossed, and your feet flat on the floor. The cuff of the blood pressure monitor will be placed directly against the skin of your upper arm at the level of your heart. Blood pressure should be measured at least twice using the same arm. Certain conditions can cause a difference in blood pressure between your right and left arms. If you have a high blood pressure reading during one visit or you have normal blood pressure with other risk factors, you may be asked to: Return on a different day to have your blood pressure checked again. Monitor your blood pressure at home for 1 week or longer. If you are diagnosed with hypertension, you may have other blood or imaging tests to help your health care provider understand your overall risk for other conditions. How is this treated? This condition is treated by making healthy lifestyle changes, such as eating healthy foods, exercising more, and reducing your alcohol intake. You may be referred for counseling on a healthy diet and physical activity. Your health care provider may prescribe medicine if lifestyle changes are not enough to get your blood pressure under control and if: Your systolic blood pressure is above 130. Your diastolic blood pressure is above 80. Your personal target blood pressure may vary depending on your medical conditions, your age, and other factors. Follow these instructions at home: Eating and drinking  Eat a diet that is high in fiber and potassium, and low in sodium, added sugar, and fat. An example of this eating plan is called the DASH diet. DASH stands for Dietary Approaches to Stop Hypertension. To eat this way: Eat  plenty of fresh fruits and vegetables. Try to fill one half of your plate at each meal with fruits and vegetables. Eat whole grains, such as whole-wheat pasta, brown rice, or whole-grain bread. Fill about one  fourth of your plate with whole grains. Eat or drink low-fat dairy products, such as skim milk or low-fat yogurt. Avoid fatty cuts of meat, processed or cured meats, and poultry with skin. Fill about one fourth of your plate with lean proteins, such as fish, chicken without skin, beans, eggs, or tofu. Avoid pre-made and processed foods. These tend to be higher in sodium, added sugar, and fat. Reduce your daily sodium intake. Many people with hypertension should eat less than 1,500 mg of sodium a day. Do not drink alcohol if: Your health care provider tells you not to drink. You are pregnant, may be pregnant, or are planning to become pregnant. If you drink alcohol: Limit how much you have to: 0-1 drink a day for women. 0-2 drinks a day for men. Know how much alcohol is in your drink. In the [place] one drink equals one 12 oz bottle of beer (355 mL), one 5 oz glass of wine (148 mL), or one 1 oz glass of hard liquor (44 mL). Lifestyle  Work with your health care provider to maintain a healthy body weight or to lose weight. Ask what an ideal weight is for you. Get at least 30 minutes of exercise that causes your heart to beat faster (aerobic exercise) most days of the week. Activities may include walking, swimming, or biking. Include exercise to strengthen your muscles (resistance exercise), such as Pilates or lifting weights, as part of your weekly exercise routine. Try to do these types of exercises for 30 minutes at least 3 days a week. Do not use any products that contain nicotine or tobacco. These products include cigarettes, chewing tobacco, and vaping devices, such as e-cigarettes. If you need help quitting, ask your health care provider. Monitor your blood pressure at home as told by your health care provider. Keep all follow-up visits. This is important. Medicines Take over-the-counter and prescription medicines only as told by your health care provider. Follow directions carefully. Blood  pressure medicines must be taken as prescribed. Do not skip doses of blood pressure medicine. Doing this puts you at risk for problems and can make the medicine less effective. Ask your health care provider about side effects or reactions to medicines that you should watch for. Contact a health care provider if you: Think you are having a reaction to a medicine you are taking. Have headaches that keep coming back (recurring). Feel dizzy. Have swelling in your ankles. Have trouble with your vision. Get help right away if you: Develop a severe headache or confusion. Have unusual weakness or numbness. Feel faint. Have severe pain in your chest or abdomen. Vomit repeatedly. Have trouble breathing. These symptoms may be an emergency. Get help right away. Call 911. Do not wait to see if the symptoms will go away. Do not drive yourself to the hospital. Summary Hypertension is when the force of blood pumping through your arteries is too strong. If this condition is not controlled, it may put you at risk for serious complications. Your personal target blood pressure may vary depending on your medical conditions, your age, and other factors. For most people, a normal blood pressure is less than 120/80. Hypertension is treated with lifestyle changes, medicines, or a combination of both. Lifestyle changes include losing weight, eating a healthy,  low-sodium diet, exercising more, and limiting alcohol. This information is not intended to replace advice given to you by your health care provider. Make sure you discuss any questions you have with your health care provider. Document Revised: 02/13/2021 Document Reviewed: 02/13/2021 Elsevier Patient Education  2024 [employer].

## 2023-10-13 NOTE — Assessment & Plan Note (Signed)
Stable.  Sees psychiatrist on a regular basis. Medications handled by their office 

## 2023-10-13 NOTE — Assessment & Plan Note (Signed)
Diet and nutrition discussed.  Advised to decrease amount of daily carbohydrate intake and daily calories and increase amount of plant-based protein in his diet. Benefits of exercise discussed. 

## 2023-10-15 ENCOUNTER — Telehealth: Payer: Self-pay

## 2023-10-15 DIAGNOSIS — R0683 Snoring: Secondary | ICD-10-CM

## 2023-10-15 NOTE — Telephone Encounter (Signed)
 Copied from CRM [id number]. Topic: Clinical - Lab/Test Results >> Oct 15, 2023  3:03 PM Noah Ramsey wrote: Reason for CRM: pt saw Noah Ramsey on 6/12 and his understanding Noah Ramsey was going to order home sleep study. Pt has not heard anything, and would like someone to follow up on home sleep study w/ him. Please call pt  [phone]   Order was just placed was not ordered as last office visit will route to [facility] for urgent scheduling and called patient to inform them that they should be hearing back soon

## 2023-10-15 NOTE — Telephone Encounter (Signed)
 Patient has been scheduled for Home Sleep test on 11/24/23

## 2023-10-16 ENCOUNTER — Encounter: Payer: Self-pay | Admitting: Emergency Medicine

## 2023-10-17 LAB — ADH: ADH: <0.8 pg/mL (ref 0.0–4.7)

## 2023-10-20 ENCOUNTER — Other Ambulatory Visit (HOSPITAL_COMMUNITY): Payer: Self-pay

## 2023-10-20 ENCOUNTER — Telehealth: Payer: Self-pay

## 2023-10-20 NOTE — Telephone Encounter (Signed)
 Patient A1C is 6.3. Tried to run test claim for [facility], patient has plan benefit exclusion. Zepbound only approved for diagnosis of OSA with sleep study.  Ozempic Brena is approved exclusively as an adjunct to diet and exercise to improve glycemic  control in adults with type 2 diabetes mellitus. A review of patient's medical chart reveals no  documented diagnosis of type 2 diabetes or an A1C indicative of diabetes. Therefore, they do not  currently meet the criteria for prior authorization of this medication. If clinically appropriate, alternative  options such as Saxenda, Zepbound, or Georjean may be considered for this patient.

## 2023-11-05 ENCOUNTER — Telehealth (HOSPITAL_BASED_OUTPATIENT_CLINIC_OR_DEPARTMENT_OTHER): Payer: Self-pay

## 2023-11-05 NOTE — Telephone Encounter (Unsigned)
 Copied from CRM [id number]. Topic: Clinical - Prescription Issue >> Nov 05, 2023 12:40 PM Noah Ramsey wrote: Reason for CRM: Patient would like to know when to expect his at home sleep study to arrive.

## 2023-11-11 ENCOUNTER — Ambulatory Visit: Payer: Self-pay | Admitting: *Deleted

## 2023-11-11 DIAGNOSIS — R7303 Prediabetes: Secondary | ICD-10-CM | POA: Diagnosis not present

## 2023-11-11 DIAGNOSIS — E782 Mixed hyperlipidemia: Secondary | ICD-10-CM | POA: Diagnosis not present

## 2023-11-11 DIAGNOSIS — K219 Gastro-esophageal reflux disease without esophagitis: Secondary | ICD-10-CM | POA: Diagnosis not present

## 2023-11-11 DIAGNOSIS — G4733 Obstructive sleep apnea (adult) (pediatric): Secondary | ICD-10-CM | POA: Diagnosis not present

## 2023-11-11 NOTE — Telephone Encounter (Signed)
 Reason for Disposition  [1] Systolic BP >= 130 OR Diastolic >= 80 AND [2] taking BP medications  Answer Assessment - Initial Assessment Questions 1. BLOOD PRESSURE: What is your blood pressure? Did you take at least two measurements 5 minutes apart?     My BP 137/77 today, 146/107 Sat. My normal BP is 116-128/around 90. I'm having brain fog.   No dizziness I'm checking it once to twice a day.   140 highest and 116 at the lowest. 2. ONSET: When did you take your blood pressure?     Today 3. HOW: How did you take your blood pressure? (e.g., automatic home BP monitor, visiting nurse)     I have a home monitor.   I have a pulse meter too.  It was a pulse oximeter and his readings were fine in the 90s. 4. HISTORY: Do you have a history of high blood pressure?     Yes  5. MEDICINES: Are you taking any medicines for blood pressure? Have you missed any doses recently?     I take amlodipine  5 mg daily. 6. OTHER SYMPTOMS: Do you have any symptoms? (e.g., blurred vision, chest pain, difficulty breathing, headache, weakness)     Foggy headed. 7. PREGNANCY: Is there any chance you are pregnant? When was your last menstrual period?     N/A  Protocols used: Blood Pressure - High-A-AH FYI Only or Action Required?: FYI only for provider.  Patient was last seen in primary care on 10/13/2023 by Purcell Emil Schanz, MD.  Called Nurse Triage reporting Hypertension.  Symptoms began several days ago. Feeling foggy headed and concerned that BP is elevated for him.  Interventions attempted: Prescription medications: Amlodipine  5 mg daily.  Symptoms are: gradually worsening.  Triage Disposition: See PCP Within 2 Weeks  Patient/caregiver understands and will follow disposition?: Yes

## 2023-11-12 ENCOUNTER — Ambulatory Visit: Admitting: Emergency Medicine

## 2023-11-18 ENCOUNTER — Ambulatory Visit: Admitting: Emergency Medicine

## 2023-11-21 ENCOUNTER — Encounter

## 2023-11-24 ENCOUNTER — Encounter

## 2023-11-26 ENCOUNTER — Ambulatory Visit: Admitting: Podiatry

## 2023-12-02 ENCOUNTER — Encounter: Payer: Self-pay | Admitting: Emergency Medicine

## 2023-12-03 ENCOUNTER — Ambulatory Visit

## 2023-12-03 ENCOUNTER — Encounter: Payer: Self-pay | Admitting: Emergency Medicine

## 2023-12-03 ENCOUNTER — Encounter: Payer: Self-pay | Admitting: Internal Medicine

## 2023-12-03 DIAGNOSIS — R0683 Snoring: Secondary | ICD-10-CM

## 2023-12-03 NOTE — Telephone Encounter (Signed)
 Can't see message with readings.

## 2023-12-04 ENCOUNTER — Ambulatory Visit: Payer: Self-pay

## 2023-12-04 ENCOUNTER — Telehealth: Payer: Self-pay

## 2023-12-04 NOTE — Telephone Encounter (Signed)
 Recommend to increase amlodipine  to 10 mg daily

## 2023-12-04 NOTE — Telephone Encounter (Unsigned)
 Copied from CRM #[id number]. Topic: Clinical - Medical Advice >> Dec 03, 2023  3:11 PM Turkey A wrote: Reason for CRM: Patient would like to know if his insurance will cover the glucose monitor and test strips-please contact >> Dec 03, 2023  3:53 PM Antonio H wrote: Routing message to correct clinic

## 2023-12-04 NOTE — Telephone Encounter (Signed)
 Copied from CRM [id number]. Topic: Clinical - Medical Advice >> Dec 04, 2023  9:32 AM Leila BROCKS wrote: Reason for CRM: Patient [phone] states picked up at home sleep kit yesterday at the office. Patient needs guidance on there's a blue strap to a white connector to the device and is unsure what to do, please call back.  Can you guys please advise and give patient instructions. Thank you!

## 2023-12-04 NOTE — Telephone Encounter (Signed)
 FYI Only or Action Required?: FYI only for provider.  Patient was last seen in primary care on 10/13/2023 by Purcell Emil Schanz, MD.  Called Nurse Triage reporting Hypertension.  Symptoms began several days ago.  Interventions attempted: Nothing.  Symptoms are: unchanged.  Triage Disposition: See PCP Within 2 Weeks  Patient/caregiver understands and will follow disposition?: Yes, will follow disposition  Copied from CRM [id number]. Topic: Clinical - Red Word Triage >> Dec 04, 2023 12:59 PM Noah Ramsey wrote: Red Word that prompted transfer to Nurse Triage: patient is calling stating he is experiencing high blood pressure - per mychart message from 08/13 he states his blood pressure has been around the readings listed below  144/91: last week 136/88: This morning today 152/101: two weeks ago med amLODipine  (NORVASC ) 5 MG tablet Reason for Disposition  [1] Systolic BP >= 130 OR Diastolic >= 80 AND [2] taking BP medications  Answer Assessment - Initial Assessment Questions 1. BLOOD PRESSURE: What is your blood pressure? Did you take at least two measurements 5 minutes apart?     135/94 2. ONSET: When did you take your blood pressure?     Currently while on phone 3. HOW: How did you take your blood pressure? (e.g., automatic home BP monitor, visiting nurse)     Auto  4. HISTORY: Do you have Ramsey history of high blood pressure?     Yes, recently started amlodipine  5. MEDICINES: Are you taking any medicines for blood pressure? Have you missed any doses recently?     denies 6. OTHER SYMPTOMS: Do you have any symptoms? (e.g., blurred vision, chest pain, difficulty breathing, headache, weakness)     Pt states that difficulty breathing has not worsened.   Pt states that he has been communicating via mychart.  Protocols used: Blood Pressure - High-Ramsey-AH

## 2023-12-05 ENCOUNTER — Other Ambulatory Visit: Payer: Self-pay | Admitting: Radiology

## 2023-12-05 DIAGNOSIS — R7303 Prediabetes: Secondary | ICD-10-CM

## 2023-12-05 MED ORDER — ACCU-CHEK SOFTCLIX LANCETS MISC: 12 refills | Status: DC

## 2023-12-05 MED ORDER — ACCU-CHEK GUIDE TEST VI STRP: ORAL_STRIP | 12 refills | Status: DC

## 2023-12-05 MED ORDER — ACCU-CHEK GUIDE W/DEVICE KIT: PACK | 0 refills | Status: DC

## 2023-12-05 NOTE — Telephone Encounter (Signed)
 I spoke with the patient and he completed the sleep study and returned to the office yesterday. As for the diabetes kit he spoke to the correct office

## 2023-12-08 ENCOUNTER — Ambulatory Visit (INDEPENDENT_AMBULATORY_CARE_PROVIDER_SITE_OTHER): Admitting: Emergency Medicine

## 2023-12-08 ENCOUNTER — Encounter: Payer: Self-pay | Admitting: Emergency Medicine

## 2023-12-08 VITALS — BP 128/84 | HR 96 | Temp 98.2°F | Ht 67.0 in | Wt 368.0 lb

## 2023-12-08 DIAGNOSIS — I10 Essential (primary) hypertension: Secondary | ICD-10-CM | POA: Diagnosis not present

## 2023-12-08 DIAGNOSIS — F200 Paranoid schizophrenia: Secondary | ICD-10-CM

## 2023-12-08 DIAGNOSIS — R7303 Prediabetes: Secondary | ICD-10-CM | POA: Diagnosis not present

## 2023-12-08 DIAGNOSIS — G4733 Obstructive sleep apnea (adult) (pediatric): Secondary | ICD-10-CM | POA: Diagnosis not present

## 2023-12-08 MED ORDER — ACCU-CHEK GUIDE TEST VI STRP: ORAL_STRIP | 12 refills | Status: DC

## 2023-12-08 NOTE — Assessment & Plan Note (Signed)
 Cardiovascular risks associated with diabetes discussed Diet and nutrition discussed Lab Results  Component Value Date   HGBA1C 6.3 10/13/2023

## 2023-12-08 NOTE — Assessment & Plan Note (Signed)
 Clinically stable.  Sees psychiatrist on a regular basis. All medications handled by psychiatrist office

## 2023-12-08 NOTE — Progress Notes (Signed)
 Noah Ramsey 29 y.o.   Chief Complaint  Patient presents with   Hypertension    Patient here for elevated bp. Patient states the highest readings has been 152/ 110. Patient says his bp has been up and down. Patient mentions having pains all over (toes, ankle, knees, and back) he is only taking ACE and a gel, states it does help.     HISTORY OF PRESENT ILLNESS: This is a 28 y.o. male here with concerns about hypertension Last week had readings as high as 152/110 Asymptomatic today. Has occasional overall pains No other complaints or medical concerns today. BP Readings from Last 3 Encounters:  12/08/23 128/84  10/13/23 130/86  10/02/23 120/84     Hypertension Pertinent negatives include no chest pain, headaches, palpitations or shortness of breath.     Prior to Admission medications   Medication Sig Start Date End Date Taking? Authorizing Provider  Accu-Chek Softclix Lancets lancets Use as instructed 12/05/23  Yes Randee Huston, Emil Schanz, MD  amLODipine  (NORVASC ) 5 MG tablet Take 1 tablet (5 mg total) by mouth daily. 09/29/23  Yes Burns, Glade PARAS, MD  benztropine  (COGENTIN ) 0.5 MG tablet Take 1 tablet (0.5 mg total) by mouth 2 (two) times daily for 14 days. 12/26/22 12/08/23 Yes Massengill, Rankin, MD  Blood Glucose Monitoring Suppl (ACCU-CHEK GUIDE) w/Device KIT USE GLUCOSE MONITOR DAILY AS NEEDED TO CHECK SUGAR LEVELS 12/05/23  Yes Asia Favata, Emil Schanz, MD  buPROPion HCl ER, XL, 450 MG TB24 Take 1 tablet by mouth every morning. 09/25/23  Yes [provider]  Cholecalciferol  (VITAMIN D3) 250 MCG (10000 UT) capsule Take 10,000 Units by mouth daily.   Yes [provider]  INVEGA  TRINZA 819 MG/2.63ML injection Inject 819 mg into the muscle every 3 (three) months.   Yes [provider]  metFORMIN  (GLUCOPHAGE ) 1000 MG tablet Take 1 tablet (1,000 mg total) by mouth 2 (two) times daily with a meal. 09/16/23  Yes Matilde Markie, Emil Schanz, MD  propranolol  (INDERAL ) 10 MG  tablet Take 10 mg by mouth 2 (two) times daily.   Yes [provider]  semaglutide -weight management (WEGOVY) 0.5 MG/0.5ML SOAJ SQ injection Inject 0.5 mg into the skin once a week. 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   Urinary incontinence 03/19/2023   Schizophrenia, paranoid (HCC) 12/17/2022   Paranoid schizophrenia (HCC) 12/17/2022   Bipolar 1 disorder, depressed, moderate (HCC) 07/19/2022   Prediabetes 05/31/2021   Morbid obesity (HCC) 05/31/2021   Psychosis (HCC) 12/03/2020   Schizophrenia, 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  Social History   Socioeconomic History   Marital status: Single    Spouse name: Not on file   Number of children: Not on file   Years of education: Not on file   Highest education level: Associate degree: occupational, Scientist, product/process development, or vocational program  Occupational History  Occupation: disablied  Tobacco Use   Smoking status: Former    Current packs/day: 0.00    Types: Cigarettes    Quit date: 2023    Years since quitting: 2.6   Smokeless tobacco: Never  Vaping Use   Vaping status: Never Used  Substance and Sexual Activity   Alcohol use: Never   Drug use: Never   Sexual activity: Not on file  Other Topics Concern   Not on file  Social History Narrative   Lives with family   Social Drivers of Health   Financial  Resource Strain: Low Risk  (10/09/2023)   Overall Financial Resource Strain (CARDIA)    Difficulty of Paying Living Expenses: Not hard at all  Recent Concern: Financial Resource Strain - Medium Risk (08/18/2023)   Received from [facility]   Overall Financial Resource Strain (CARDIA)    Difficulty of Paying Living Expenses: Somewhat hard  Food Insecurity: No Food Insecurity (10/09/2023)   Hunger Vital Sign    Worried About Running Out of Food in the Last Year: Never true    Ran Out of Food in the Last Year: Never true  Recent Concern: Food Insecurity - Food Insecurity Present (07/27/2023)   Hunger Vital Sign    Worried About Running Out of Food in the Last Year: Often true    Ran Out of Food in the Last Year: Often true  Transportation Needs: No Transportation Needs (10/09/2023)   PRAPARE - Administrator, Civil Service (Medical): No    Lack of Transportation (Non-Medical): No  Physical Activity: Sufficiently Active (10/09/2023)   Exercise Vital Sign    Days of Exercise per Week: 4 days    Minutes of Exercise per Session: 40 min  Stress: Stress Concern Present (10/09/2023)   [employer] of Occupational Health - Occupational Stress Questionnaire    Feeling of Stress: To some extent  Social Connections: Moderately Isolated (10/09/2023)   Social Connection and Isolation Panel    Frequency of Communication with Friends and Family: Three times a week    Frequency of Social Gatherings with Friends and Family: Twice a week    Attends Religious Services: 1 to 4 times per year    Active Member of [employer] or Organizations: No    Attends Engineer, structural: Not on file    Marital Status: Never married  Intimate Partner Violence: Not At Risk (08/18/2023)   Received from [facility]   HITS    Over the last 12 months how often did your partner physically hurt you?: Never    Over the last 12 months how often did your partner insult you or talk down to you?: Never    Over  the last 12 months how often did your partner threaten you with physical harm?: Never    Over the last 12 months how often did your partner scream or curse at you?: Never    Family History  Problem Relation Age of Onset   Mental illness Brother      Review of Systems  Constitutional: Negative.  Negative for chills and fever.  HENT: Negative.  Negative for congestion and sore throat.   Respiratory: Negative.  Negative for cough and shortness of breath.   Cardiovascular: Negative.  Negative for chest pain and palpitations.  Gastrointestinal:  Negative for abdominal pain, diarrhea, nausea and vomiting.  Genitourinary: Negative.  Negative for dysuria and hematuria.  Skin: Negative.  Negative for rash.  Neurological:  Negative for dizziness and headaches.  All other systems reviewed and are negative.   Vitals:   12/08/23 1400  BP: 128/84  Pulse: 96  Temp: 98.2 F (36.8 C)  SpO2: 95%    Physical Exam Vitals reviewed.  Constitutional:      Appearance: Normal appearance.  HENT:     Head: Normocephalic.     Mouth/Throat:     Mouth: Mucous membranes are moist.     Pharynx: Oropharynx is clear.  Eyes:     Extraocular Movements: Extraocular movements intact.     Conjunctiva/sclera: Conjunctivae normal.     Pupils: Pupils are equal, round, and reactive to light.  Cardiovascular:     Rate and Rhythm: Normal rate and regular rhythm.     Pulses: Normal pulses.     Heart sounds: Normal heart sounds.  Pulmonary:     Effort: Pulmonary effort is normal.     Breath sounds: Normal breath sounds.  Musculoskeletal:     Cervical back: No tenderness.  Lymphadenopathy:     Cervical: No cervical adenopathy.  Skin:    General: Skin is warm and dry.  Neurological:     Mental Status: He is alert and oriented to person, place, and time.  Psychiatric:        Mood and Affect: Mood normal.        Behavior: Behavior normal.      ASSESSMENT & PLAN: A total of 43 minutes was spent with  the patient and counseling/coordination of care regarding preparing for this visit, review of most recent office visit notes, review of multiple chronic medical conditions and their management, review of all medications, review of most recent bloodwork results, review of health maintenance items, education on nutrition, prognosis, documentation, and need for follow up.   Problem List Items Addressed This Visit       Cardiovascular and Mediastinum   Hypertension - Primary   BP Readings from Last 3 Encounters:  12/08/23 128/84  10/13/23 130/86  10/02/23 120/84  Well-controlled hypertension today Had abnormal readings last week Advised to continue monitoring blood pressure readings daily for the next several weeks and contact the office if numbers persistently abnormal For now continue amlodipine  5 mg daily.  Advise he can go up to 10 mg daily as needed       Relevant Medications   propranolol  (INDERAL ) 10 MG tablet     Other   Prediabetes   Cardiovascular risks associated with diabetes discussed Diet and nutrition discussed Lab Results  Component Value Date   HGBA1C 6.3 10/13/2023         Relevant Medications   glucose blood (ACCU-CHEK GUIDE TEST) test strip   Morbid obesity (HCC)   Diet and nutrition discussed Advised to decrease amount of daily carbohydrate intake and daily calories and increase amount of plant-based protein in his diet Benefits of exercise discussed Was able to follow-up with bariatric clinic recently Was started on Wegovy First dose yesterday      Relevant Medications   semaglutide -weight management (WEGOVY) 0.5 MG/0.5ML SOAJ SQ injection (Start on 12/09/2023)   Paranoid schizophrenia (HCC)   Clinically stable.  Sees psychiatrist on a regular basis. All medications handled by psychiatrist office      Patient Instructions  Hypertension, Adult High blood pressure (hypertension) is when the force of blood pumping through the arteries is too strong.  The arteries are the blood vessels that carry blood from the heart throughout the body. Hypertension forces the heart to work harder to pump blood and may cause  arteries to become narrow or stiff. Untreated or uncontrolled hypertension can lead to a heart attack, heart failure, a stroke, kidney disease, and other problems. A blood pressure reading consists of a higher number over a lower number. Ideally, your blood pressure should be below 120/80. The first (top) number is called the systolic pressure. It is a measure of the pressure in your arteries as your heart beats. The second (bottom) number is called the diastolic pressure. It is a measure of the pressure in your arteries as the heart relaxes. What are the causes? The exact cause of this condition is not known. There are some conditions that result in high blood pressure. What increases the risk? Certain factors may make you more likely to develop high blood pressure. Some of these risk factors are under your control, including: Smoking. Not getting enough exercise or physical activity. Being overweight. Having too much fat, sugar, calories, or salt (sodium) in your diet. Drinking too much alcohol. Other risk factors include: Having a personal history of heart disease, diabetes, high cholesterol, or kidney disease. Stress. Having a family history of high blood pressure and high cholesterol. Having obstructive sleep apnea. Age. The risk increases with age. What are the signs or symptoms? High blood pressure may not cause symptoms. Very high blood pressure (hypertensive crisis) may cause: Headache. Fast or irregular heartbeats (palpitations). Shortness of breath. Nosebleed. Nausea and vomiting. Vision changes. Severe chest pain, dizziness, and seizures. How is this diagnosed? This condition is diagnosed by measuring your blood pressure while you are seated, with your arm resting on a flat surface, your legs uncrossed, and your  feet flat on the floor. The cuff of the blood pressure monitor will be placed directly against the skin of your upper arm at the level of your heart. Blood pressure should be measured at least twice using the same arm. Certain conditions can cause a difference in blood pressure between your right and left arms. If you have a high blood pressure reading during one visit or you have normal blood pressure with other risk factors, you may be asked to: Return on a different day to have your blood pressure checked again. Monitor your blood pressure at home for 1 week or longer. If you are diagnosed with hypertension, you may have other blood or imaging tests to help your health care provider understand your overall risk for other conditions. How is this treated? This condition is treated by making healthy lifestyle changes, such as eating healthy foods, exercising more, and reducing your alcohol intake. You may be referred for counseling on a healthy diet and physical activity. Your health care provider may prescribe medicine if lifestyle changes are not enough to get your blood pressure under control and if: Your systolic blood pressure is above 130. Your diastolic blood pressure is above 80. Your personal target blood pressure may vary depending on your medical conditions, your age, and other factors. Follow these instructions at home: Eating and drinking  Eat a diet that is high in fiber and potassium, and low in sodium, added sugar, and fat. An example of this eating plan is called the DASH diet. DASH stands for Dietary Approaches to Stop Hypertension. To eat this way: Eat plenty of fresh fruits and vegetables. Try to fill one half of your plate at each meal with fruits and vegetables. Eat whole grains, such as whole-wheat pasta, brown rice, or whole-grain bread. Fill about one fourth of your plate with whole grains. Eat or drink low-fat  dairy products, such as skim milk or low-fat yogurt. Avoid  fatty cuts of meat, processed or cured meats, and poultry with skin. Fill about one fourth of your plate with lean proteins, such as fish, chicken without skin, beans, eggs, or tofu. Avoid pre-made and processed foods. These tend to be higher in sodium, added sugar, and fat. Reduce your daily sodium intake. Many people with hypertension should eat less than 1,500 mg of sodium a day. Do not drink alcohol if: Your health care provider tells you not to drink. You are pregnant, may be pregnant, or are planning to become pregnant. If you drink alcohol: Limit how much you have to: 0-1 drink a day for women. 0-2 drinks a day for men. Know how much alcohol is in your drink. In the [place] one drink equals one 12 oz bottle of beer (355 mL), one 5 oz glass of wine (148 mL), or one 1 oz glass of hard liquor (44 mL). Lifestyle  Work with your health care provider to maintain a healthy body weight or to lose weight. Ask what an ideal weight is for you. Get at least 30 minutes of exercise that causes your heart to beat faster (aerobic exercise) most days of the week. Activities may include walking, swimming, or biking. Include exercise to strengthen your muscles (resistance exercise), such as Pilates or lifting weights, as part of your weekly exercise routine. Try to do these types of exercises for 30 minutes at least 3 days a week. Do not use any products that contain nicotine  or tobacco. These products include cigarettes, chewing tobacco, and vaping devices, such as e-cigarettes. If you need help quitting, ask your health care provider. Monitor your blood pressure at home as told by your health care provider. Keep all follow-up visits. This is important. Medicines Take over-the-counter and prescription medicines only as told by your health care provider. Follow directions carefully. Blood pressure medicines must be taken as prescribed. Do not skip doses of blood pressure medicine. Doing this puts you at risk  for problems and can make the medicine less effective. Ask your health care provider about side effects or reactions to medicines that you should watch for. Contact a health care provider if you: Think you are having a reaction to a medicine you are taking. Have headaches that keep coming back (recurring). Feel dizzy. Have swelling in your ankles. Have trouble with your vision. Get help right away if you: Develop a severe headache or confusion. Have unusual weakness or numbness. Feel faint. Have severe pain in your chest or abdomen. Vomit repeatedly. Have trouble breathing. These symptoms may be an emergency. Get help right away. Call 911. Do not wait to see if the symptoms will go away. Do not drive yourself to the hospital. Summary Hypertension is when the force of blood pumping through your arteries is too strong. If this condition is not controlled, it may put you at risk for serious complications. Your personal target blood pressure may vary depending on your medical conditions, your age, and other factors. For most people, a normal blood pressure is less than 120/80. Hypertension is treated with lifestyle changes, medicines, or a combination of both. Lifestyle changes include losing weight, eating a healthy, low-sodium diet, exercising more, and limiting alcohol. This information is not intended to replace advice given to you by your health care provider. Make sure you discuss any questions you have with your health care provider. Document Revised: 02/13/2021 Document Reviewed: 02/13/2021 Elsevier Patient Education  2024  Elsevier Inc.     Emil Schaumann, MD [facility]

## 2023-12-08 NOTE — Patient Instructions (Signed)
 Hypertension, Adult High blood pressure (hypertension) is when the force of blood pumping through the arteries is too strong. The arteries are the blood vessels that carry blood from the heart throughout the body. Hypertension forces the heart to work harder to pump blood and may cause arteries to become narrow or stiff. Untreated or uncontrolled hypertension can lead to a heart attack, heart failure, a stroke, kidney disease, and other problems. A blood pressure reading consists of a higher number over a lower number. Ideally, your blood pressure should be below 120/80. The first ("top") number is called the systolic pressure. It is a measure of the pressure in your arteries as your heart beats. The second ("bottom") number is called the diastolic pressure. It is a measure of the pressure in your arteries as the heart relaxes. What are the causes? The exact cause of this condition is not known. There are some conditions that result in high blood pressure. What increases the risk? Certain factors may make you more likely to develop high blood pressure. Some of these risk factors are under your control, including: Smoking. Not getting enough exercise or physical activity. Being overweight. Having too much fat, sugar, calories, or salt (sodium) in your diet. Drinking too much alcohol. Other risk factors include: Having a personal history of heart disease, diabetes, high cholesterol, or kidney disease. Stress. Having a family history of high blood pressure and high cholesterol. Having obstructive sleep apnea. Age. The risk increases with age. What are the signs or symptoms? High blood pressure may not cause symptoms. Very high blood pressure (hypertensive crisis) may cause: Headache. Fast or irregular heartbeats (palpitations). Shortness of breath. Nosebleed. Nausea and vomiting. Vision changes. Severe chest pain, dizziness, and seizures. How is this diagnosed? This condition is diagnosed by  measuring your blood pressure while you are seated, with your arm resting on a flat surface, your legs uncrossed, and your feet flat on the floor. The cuff of the blood pressure monitor will be placed directly against the skin of your upper arm at the level of your heart. Blood pressure should be measured at least twice using the same arm. Certain conditions can cause a difference in blood pressure between your right and left arms. If you have a high blood pressure reading during one visit or you have normal blood pressure with other risk factors, you may be asked to: Return on a different day to have your blood pressure checked again. Monitor your blood pressure at home for 1 week or longer. If you are diagnosed with hypertension, you may have other blood or imaging tests to help your health care provider understand your overall risk for other conditions. How is this treated? This condition is treated by making healthy lifestyle changes, such as eating healthy foods, exercising more, and reducing your alcohol intake. You may be referred for counseling on a healthy diet and physical activity. Your health care provider may prescribe medicine if lifestyle changes are not enough to get your blood pressure under control and if: Your systolic blood pressure is above 130. Your diastolic blood pressure is above 80. Your personal target blood pressure may vary depending on your medical conditions, your age, and other factors. Follow these instructions at home: Eating and drinking  Eat a diet that is high in fiber and potassium, and low in sodium, added sugar, and fat. An example of this eating plan is called the DASH diet. DASH stands for Dietary Approaches to Stop Hypertension. To eat this way: Eat  plenty of fresh fruits and vegetables. Try to fill one half of your plate at each meal with fruits and vegetables. Eat whole grains, such as whole-wheat pasta, brown rice, or whole-grain bread. Fill about one  fourth of your plate with whole grains. Eat or drink low-fat dairy products, such as skim milk or low-fat yogurt. Avoid fatty cuts of meat, processed or cured meats, and poultry with skin. Fill about one fourth of your plate with lean proteins, such as fish, chicken without skin, beans, eggs, or tofu. Avoid pre-made and processed foods. These tend to be higher in sodium, added sugar, and fat. Reduce your daily sodium intake. Many people with hypertension should eat less than 1,500 mg of sodium a day. Do not drink alcohol if: Your health care provider tells you not to drink. You are pregnant, may be pregnant, or are planning to become pregnant. If you drink alcohol: Limit how much you have to: 0-1 drink a day for women. 0-2 drinks a day for men. Know how much alcohol is in your drink. In the [place] one drink equals one 12 oz bottle of beer (355 mL), one 5 oz glass of wine (148 mL), or one 1 oz glass of hard liquor (44 mL). Lifestyle  Work with your health care provider to maintain a healthy body weight or to lose weight. Ask what an ideal weight is for you. Get at least 30 minutes of exercise that causes your heart to beat faster (aerobic exercise) most days of the week. Activities may include walking, swimming, or biking. Include exercise to strengthen your muscles (resistance exercise), such as Pilates or lifting weights, as part of your weekly exercise routine. Try to do these types of exercises for 30 minutes at least 3 days a week. Do not use any products that contain nicotine or tobacco. These products include cigarettes, chewing tobacco, and vaping devices, such as e-cigarettes. If you need help quitting, ask your health care provider. Monitor your blood pressure at home as told by your health care provider. Keep all follow-up visits. This is important. Medicines Take over-the-counter and prescription medicines only as told by your health care provider. Follow directions carefully. Blood  pressure medicines must be taken as prescribed. Do not skip doses of blood pressure medicine. Doing this puts you at risk for problems and can make the medicine less effective. Ask your health care provider about side effects or reactions to medicines that you should watch for. Contact a health care provider if you: Think you are having a reaction to a medicine you are taking. Have headaches that keep coming back (recurring). Feel dizzy. Have swelling in your ankles. Have trouble with your vision. Get help right away if you: Develop a severe headache or confusion. Have unusual weakness or numbness. Feel faint. Have severe pain in your chest or abdomen. Vomit repeatedly. Have trouble breathing. These symptoms may be an emergency. Get help right away. Call 911. Do not wait to see if the symptoms will go away. Do not drive yourself to the hospital. Summary Hypertension is when the force of blood pumping through your arteries is too strong. If this condition is not controlled, it may put you at risk for serious complications. Your personal target blood pressure may vary depending on your medical conditions, your age, and other factors. For most people, a normal blood pressure is less than 120/80. Hypertension is treated with lifestyle changes, medicines, or a combination of both. Lifestyle changes include losing weight, eating a healthy,  low-sodium diet, exercising more, and limiting alcohol. This information is not intended to replace advice given to you by your health care provider. Make sure you discuss any questions you have with your health care provider. Document Revised: 02/13/2021 Document Reviewed: 02/13/2021 Elsevier Patient Education  2024 [employer].

## 2023-12-08 NOTE — Telephone Encounter (Signed)
Recommend Tylenol and or Advil as needed 

## 2023-12-08 NOTE — Assessment & Plan Note (Signed)
 BP Readings from Last 3 Encounters:  12/08/23 128/84  10/13/23 130/86  10/02/23 120/84  Well-controlled hypertension today Had abnormal readings last week Advised to continue monitoring blood pressure readings daily for the next several weeks and contact the office if numbers persistently abnormal For now continue amlodipine  5 mg daily.  Advise he can go up to 10 mg daily as needed

## 2023-12-08 NOTE — Assessment & Plan Note (Signed)
 Diet and nutrition discussed Advised to decrease amount of daily carbohydrate intake and daily calories and increase amount of plant-based protein in his diet Benefits of exercise discussed Was able to follow-up with bariatric clinic recently Was started on Wegovy First dose yesterday

## 2023-12-09 NOTE — Telephone Encounter (Signed)
 Good number after taking medication.  Continue amlodipine  5 mg daily.

## 2023-12-12 ENCOUNTER — Telehealth: Payer: Self-pay

## 2023-12-12 NOTE — Telephone Encounter (Signed)
 Copied from CRM #[id number]. Topic: Clinical - Lab/Test Results >> Dec 11, 2023 11:53 AM Isabell A wrote: Reason for CRM: Patient is requesting to discuss his sleep study results - has additional questions.   Callback number: [phone]    Spoke w/ PT Md has not review yet. Verbalized understanding    -NFN

## 2023-12-16 NOTE — Telephone Encounter (Signed)
 Good acceptable numbers.  Thanks.

## 2023-12-23 NOTE — Telephone Encounter (Signed)
 Increase amlodipine to 10 mg.

## 2024-01-05 ENCOUNTER — Ambulatory Visit: Admitting: Internal Medicine

## 2024-01-05 NOTE — Progress Notes (Signed)
 10/02/23- 28 yoM for sleep evaluation courtesy of Dr Glade Hope with concern of OSA Medical problem list includes HTN, Paranoid Schizophrenia, Bipolar, Obesity -Cogentin , Welbutrin,  Diagnosed OSA in [place] , but never started Rx. Epworth score-18 Body weight today- -----Pt states pcp thinks he has sleep apnea, snoring, wheezing while in lying, gasping for air while  waking  up, nocturnal enuresis. Discussed the use of AI scribe software for clinical note transcription with the patient, who gave verbal consent to proceed.  History of Present Illness   Noah Ramsey is a 28 year old male who presents with suspected sleep apnea. He was referred by Dr. Hope for evaluation of sleep apnea. Mother is here.  He experiences loud snoring, confirmed by a family member, and feels tired during the daytime despite a full night's sleep. He also experiences 'brain fog' and increased tiredness in the evening, particularly after 6 PM, but does not typically fall asleep unintentionally while watching TV. He takes trazodone  100 mg to aid sleep and drinks non-caffeinated tea, avoiding caffeine during the day. There is no known family history of sleep apnea, and he has not observed any episodes of apnea or breath-holding during sleep. He has no history of nasal or throat surgeries.     Assessment and Plan:    Sleep apnea Suspected due to snoring, daytime somnolence, cognitive impairment, obesity, residual tonsils. Previous sleep study inconclusive. Discussed pathophysiology and cardiovascular risks. Explained treatment options: CPAP, oral appliances, surgery. CPAP first-line, non-invasive, effective. Considered weight loss medications. - Order home sleep study. Expect insurance clearance in a week, results in two weeks. - Instruct him to contact office two weeks post-study for results. - Discuss CPAP therapy if confirmed. - Consider referral for oral appliance therapy if mild. - Discuss weight loss  medications with primary care provider.  Morbid obesity -60 lb weight gain in recent 1-2 years -Discuss Zepbound  with PCP      01/06/24- 28 yoM followed for Obstructive Sleep Apnea, complicated by HTN, Paranoid Schizophrenia, Bipolar, Obesity HST 12/04/23- AI 47.3/hr, dest to 43%-mean 76%, Body Weight 375 lbs For Treatment decision Trazodone  100, Discussed the use of AI scribe software for clinical note transcription with the patient, who gave verbal consent to proceed.  History of Present Illness   Noah Ramsey is a 28 year old male with severe sleep apnea who presents for evaluation and management of his condition.  A recent sleep study shows severe sleep apnea with an apnea-hypopnea index of 47 events per hour. He previously experienced sleep apnea in 2015 but did not receive CPAP treatment. During the sleep study, he experienced improved sleep quality despite feeling woozy and noticing airway vibration upon waking. He is concerned about the long-term health impacts of untreated sleep apnea, including cardiovascular risks and daytime fatigue. He seeks information on obtaining a CPAP machine, mask fitting, and adjustments, and is interested in its effectiveness in reducing apnea events and improving sleep quality.     Assessment and Plan:    Severe obstructive sleep apnea Severe obstructive sleep apnea confirmed by sleep study with 47 apneic events per hour. Untreated, it increases risk of cardiovascular events, strokes, and other systemic issues. - Recommend CPAP therapy as first-line treatment. - Discussed CPAP's safety, effectiveness, and non-invasive nature. - Explained CPAP is not habit-forming and does not weaken lungs. - Addressed mortality risk concerns, emphasizing untreated apnea's long-term health impact. - Emphasized importance of comfortable mask fit for effective therapy. - Order CPAP machine  auto 5-20 through medical  equipment company. - Arrange for medical  equipment company to contact him within a week for CPAP acquisition and mask fitting.     ROS-see HPI   + = positive Constitutional:    weight loss, night sweats, fevers, chills, fatigue, lassitude. HEENT:    headaches, difficulty swallowing, tooth/dental problems, sore throat,       sneezing, itching, ear ache, nasal congestion, post nasal drip, snoring CV:    chest pain, orthopnea, PND, swelling in lower extremities, anasarca,                                   dizziness, palpitations Resp:   shortness of breath with exertion or at rest.                productive cough,   non-productive cough, coughing up of blood.              change in color of mucus.  wheezing.   Skin:    rash or lesions. GI:  No-   heartburn, indigestion, abdominal pain, nausea, vomiting, diarrhea,                 change in bowel habits, loss of appetite GU: dysuria, change in color of urine, no urgency or frequency.   flank pain. MS:   joint pain, stiffness, decreased range of motion, back pain. Neuro-     nothing unusual Psych:  change in mood or affect.  depression or anxiety.   memory loss.  OBJ- Physical Exam General- Alert, Oriented, Affect-appropriate, Distress- none acute, +morbidly obese Skin- rash-none, lesions- none, excoriation- none Lymphadenopathy- none Head- atraumatic            Eyes- Gross vision intact, PERRLA, conjunctivae and secretions clear            Ears- Hearing, canals-normal            Nose- Clear, no-Septal dev, mucus, polyps, erosion, perforation             Throat- Mallampati III , mucosa clear , drainage- none, tonsils+ large/ not occlusive Neck- flexible , trachea midline, no stridor , thyroid  nl, carotid no bruit Chest - symmetrical excursion , unlabored           Heart/CV- RRR , no murmur , no gallop  , no rub, nl s1 s2                           - JVD- none , edema- none, stasis changes- none, varices- none           Lung- clear to P&A, wheeze- none, cough- none , dullness-none,  rub- none           Chest wall-  Abd-  Br/ Gen/ Rectal- Not done, not indicated Extrem- cyanosis- none, clubbing, none, atrophy- none, strength- nl Neuro- grossly intact to observation

## 2024-01-06 ENCOUNTER — Encounter: Payer: Self-pay | Admitting: Internal Medicine

## 2024-01-06 ENCOUNTER — Ambulatory Visit: Admitting: Internal Medicine

## 2024-01-06 VITALS — BP 128/90 | HR 96 | Temp 98.2°F | Ht 67.5 in | Wt 368.8 lb

## 2024-01-06 DIAGNOSIS — G4733 Obstructive sleep apnea (adult) (pediatric): Secondary | ICD-10-CM

## 2024-01-06 NOTE — Patient Instructions (Signed)
Order- new DME, new CPAP auto 5-20, mask of choice, humidifier, supplies, AirView/ card  Please call if we can help 

## 2024-01-10 ENCOUNTER — Encounter: Payer: Self-pay | Admitting: Internal Medicine

## 2024-01-12 ENCOUNTER — Other Ambulatory Visit (HOSPITAL_COMMUNITY): Payer: Self-pay

## 2024-01-12 ENCOUNTER — Telehealth: Payer: Self-pay

## 2024-01-12 NOTE — Telephone Encounter (Signed)
 Pharmacy Patient Advocate Encounter   Received notification from Patient Advice Request messages that prior authorization for Zepbound  2.5MG /0.5ML pen-injectors  is required/requested.   Insurance verification completed.   The patient is insured through [employer] .   Per test claim: PA required; PA submitted to above mentioned insurance via Latent Key/confirmation #/EOC [id number] Status is pending

## 2024-01-13 ENCOUNTER — Other Ambulatory Visit (HOSPITAL_COMMUNITY): Payer: Self-pay

## 2024-01-13 ENCOUNTER — Other Ambulatory Visit: Payer: Self-pay | Admitting: Emergency Medicine

## 2024-01-13 MED ORDER — ZEPBOUND 2.5 MG/0.5ML ~~LOC~~ SOAJ: 2.5000 mg | SUBCUTANEOUS | 3 refills | Status: DC

## 2024-01-13 NOTE — Telephone Encounter (Signed)
 Zepbound  prescription sent to [facility] pharmacy today.  Thanks.

## 2024-01-13 NOTE — Telephone Encounter (Signed)
 Pharmacy Patient Advocate Encounter  Received notification from [employer] that Prior Authorization for Zepbound  2.5MG /0.5ML pen-injectors   has been APPROVED from 01/12/24 to 04/21/24. Ran test claim, Copay is $0. This test claim was processed through [facility]- copay amounts may vary at other pharmacies due to pharmacy/plan contracts, or as the patient moves through the different stages of their insurance plan.   PA #/Case ID/Reference #: [id number]

## 2024-01-16 ENCOUNTER — Other Ambulatory Visit: Payer: Self-pay | Admitting: Radiology

## 2024-01-16 ENCOUNTER — Telehealth: Payer: Self-pay | Admitting: *Deleted

## 2024-01-16 DIAGNOSIS — G4733 Obstructive sleep apnea (adult) (pediatric): Secondary | ICD-10-CM

## 2024-01-16 MED ORDER — METFORMIN HCL 1000 MG PO TABS: 1000.0000 mg | ORAL_TABLET | Freq: Two times a day (BID) | ORAL | 1 refills | Status: DC

## 2024-01-16 NOTE — Telephone Encounter (Signed)
 Please enter new cpap order and have CY sign. Per [employer] order sent w/o signature.

## 2024-02-03 ENCOUNTER — Other Ambulatory Visit: Payer: Self-pay | Admitting: Radiology

## 2024-02-03 MED ORDER — TIRZEPATIDE-WEIGHT MANAGEMENT 5 MG/0.5ML ~~LOC~~ SOAJ: 5.0000 mg | SUBCUTANEOUS | 0 refills | Status: DC

## 2024-02-18 DIAGNOSIS — G4733 Obstructive sleep apnea (adult) (pediatric): Secondary | ICD-10-CM | POA: Diagnosis not present

## 2024-02-20 ENCOUNTER — Other Ambulatory Visit: Payer: Self-pay

## 2024-02-20 MED ORDER — METFORMIN HCL 1000 MG PO TABS: 1000.0000 mg | ORAL_TABLET | Freq: Two times a day (BID) | ORAL | 1 refills | Status: DC

## 2024-03-04 ENCOUNTER — Other Ambulatory Visit: Payer: Self-pay

## 2024-03-04 ENCOUNTER — Other Ambulatory Visit: Payer: Self-pay | Admitting: Internal Medicine

## 2024-03-05 ENCOUNTER — Other Ambulatory Visit: Payer: Self-pay

## 2024-03-05 MED ORDER — TIRZEPATIDE-WEIGHT MANAGEMENT 7.5 MG/0.5ML ~~LOC~~ SOAJ: 7.5000 mg | Freq: Once | SUBCUTANEOUS | Status: DC

## 2024-03-05 MED ORDER — ZEPBOUND 7.5 MG/0.5ML ~~LOC~~ SOAJ: 7.5000 mg | SUBCUTANEOUS | 0 refills | Status: DC

## 2024-03-22 ENCOUNTER — Encounter: Payer: Self-pay | Admitting: Emergency Medicine

## 2024-03-22 ENCOUNTER — Ambulatory Visit: Payer: 59 | Admitting: Emergency Medicine

## 2024-03-22 ENCOUNTER — Ambulatory Visit: Payer: Self-pay | Admitting: Emergency Medicine

## 2024-03-22 VITALS — BP 100/68 | HR 110 | Temp 98.9°F | Ht 67.5 in | Wt 360.0 lb

## 2024-03-22 DIAGNOSIS — G4733 Obstructive sleep apnea (adult) (pediatric): Secondary | ICD-10-CM

## 2024-03-22 DIAGNOSIS — Z0000 Encounter for general adult medical examination without abnormal findings: Secondary | ICD-10-CM | POA: Diagnosis not present

## 2024-03-22 DIAGNOSIS — R7303 Prediabetes: Secondary | ICD-10-CM | POA: Diagnosis not present

## 2024-03-22 DIAGNOSIS — Z130 Encounter for screening for diseases of the blood and blood-forming organs and certain disorders involving the immune mechanism: Secondary | ICD-10-CM

## 2024-03-22 DIAGNOSIS — I10 Essential (primary) hypertension: Secondary | ICD-10-CM

## 2024-03-22 DIAGNOSIS — Z0001 Encounter for general adult medical examination with abnormal findings: Secondary | ICD-10-CM

## 2024-03-22 DIAGNOSIS — Z1329 Encounter for screening for other suspected endocrine disorder: Secondary | ICD-10-CM | POA: Diagnosis not present

## 2024-03-22 DIAGNOSIS — F200 Paranoid schizophrenia: Secondary | ICD-10-CM

## 2024-03-22 DIAGNOSIS — Z13228 Encounter for screening for other metabolic disorders: Secondary | ICD-10-CM | POA: Diagnosis not present

## 2024-03-22 DIAGNOSIS — Z13220 Encounter for screening for lipoid disorders: Secondary | ICD-10-CM | POA: Diagnosis not present

## 2024-03-22 LAB — CBC WITH DIFFERENTIAL/PLATELET
Basophils Absolute: 0 K/uL (ref 0.0–0.1)
Basophils Relative: 0.2 % (ref 0.0–3.0)
Eosinophils Absolute: 0.1 K/uL (ref 0.0–0.7)
Eosinophils Relative: 2.3 % (ref 0.0–5.0)
HCT: 41.8 % (ref 39.0–52.0)
Hemoglobin: 14.1 g/dL (ref 13.0–17.0)
Lymphocytes Relative: 38.7 % (ref 12.0–46.0)
Lymphs Abs: 1.6 K/uL (ref 0.7–4.0)
MCHC: 33.7 g/dL (ref 30.0–36.0)
MCV: 94 fl (ref 78.0–100.0)
Monocytes Absolute: 0.4 K/uL (ref 0.1–1.0)
Monocytes Relative: 9.4 % (ref 3.0–12.0)
Neutro Abs: 2.1 K/uL (ref 1.4–7.7)
Neutrophils Relative %: 49.4 % (ref 43.0–77.0)
Platelets: 212 K/uL (ref 150.0–400.0)
RBC: 4.44 Mil/uL (ref 4.22–5.81)
RDW: 13.5 % (ref 11.5–15.5)
WBC: 4.2 K/uL (ref 4.0–10.5)

## 2024-03-22 LAB — HEMOGLOBIN A1C: Hgb A1c MFr Bld: 5.8 % (ref 4.6–6.5)

## 2024-03-22 LAB — LIPID PANEL
Cholesterol: 125 mg/dL (ref 0–200)
HDL: 36.8 mg/dL — ABNORMAL LOW (ref >39.00)
LDL Cholesterol: 74 mg/dL (ref 0–99)
NonHDL: 88.23
Total CHOL/HDL Ratio: 3
Triglycerides: 72 mg/dL (ref 0.0–149.0)
VLDL: 14.4 mg/dL (ref 0.0–40.0)

## 2024-03-22 LAB — COMPREHENSIVE METABOLIC PANEL WITH GFR
ALT: 43 U/L (ref 0–53)
AST: 28 U/L (ref 0–37)
Albumin: 4.1 g/dL (ref 3.5–5.2)
Alkaline Phosphatase: 59 U/L (ref 39–117)
BUN: 13 mg/dL (ref 6–23)
CO2: 31 meq/L (ref 19–32)
Calcium: 9.6 mg/dL (ref 8.4–10.5)
Chloride: 101 meq/L (ref 96–112)
Creatinine, Ser: 1.01 mg/dL (ref 0.40–1.50)
GFR: 101.43 mL/min (ref >60.00)
Glucose, Bld: 87 mg/dL (ref 70–99)
Potassium: 4.1 meq/L (ref 3.5–5.1)
Sodium: 138 meq/L (ref 135–145)
Total Bilirubin: 0.4 mg/dL (ref 0.2–1.2)
Total Protein: 7.8 g/dL (ref 6.0–8.3)

## 2024-03-22 MED ORDER — ZEPBOUND 10 MG/0.5ML ~~LOC~~ SOAJ: 10.0000 mg | SUBCUTANEOUS | 3 refills | Status: DC

## 2024-03-22 NOTE — Assessment & Plan Note (Signed)
 BP Readings from Last 3 Encounters:  03/22/24 100/68  01/06/24 (!) 128/90  12/08/23 128/84   Well-controlled hypertension today Had abnormal readings last week Advised to continue monitoring blood pressure readings daily for the next several weeks and contact the office if numbers persistently abnormal For now continue amlodipine  5 mg daily.  Advise he can go up to 10 mg daily as needed

## 2024-03-22 NOTE — Progress Notes (Signed)
 Noah Ramsey 28 y.o.   Chief Complaint  Patient presents with   Annual Exam    HISTORY OF PRESENT ILLNESS: This is a 28 y.o. male here for annual exam and follow-up of chronic medical conditions. Overall doing well. Has no complaints or medical concerns today. Wt Readings from Last 3 Encounters:  03/22/24 (!) 360 lb (163.3 kg)  01/06/24 (!) 368 lb 12.8 oz (167.3 kg)  12/08/23 (!) 368 lb (166.9 kg)     HPI   Prior to Admission medications   Medication Sig Start Date End Date Taking? Authorizing Provider  Accu-Chek Softclix Lancets lancets Use as instructed 12/05/23  Yes Abdel Effinger, Emil Schanz, MD  amLODipine  (NORVASC ) 5 MG tablet TAKE 1 TABLET (5 MG TOTAL) BY MOUTH DAILY. 03/05/24  Yes Maryetta Shafer, Emil Schanz, MD  benztropine  (COGENTIN ) 0.5 MG tablet Take 1 tablet (0.5 mg total) by mouth 2 (two) times daily for 14 days. 12/26/22 03/22/24 Yes Massengill, Rankin, MD  Blood Glucose Monitoring Suppl (ACCU-CHEK GUIDE) w/Device KIT USE GLUCOSE MONITOR DAILY AS NEEDED TO CHECK SUGAR LEVELS 12/05/23  Yes Ariyon Mittleman, Emil Schanz, MD  buPROPion HCl ER, XL, 450 MG TB24 Take 1 tablet by mouth every morning. 09/25/23  Yes [provider]  Cholecalciferol  (VITAMIN D3) 250 MCG (10000 UT) capsule Take 10,000 Units by mouth daily.   Yes [provider]  glucose blood (ACCU-CHEK GUIDE TEST) test strip Use test strips 3 times daily as needed to monitor blood sugar levels 12/08/23  Yes Tally Mckinnon, Emil Schanz, MD  metFORMIN  (GLUCOPHAGE ) 1000 MG tablet Take 1 tablet (1,000 mg total) by mouth 2 (two) times daily with a meal. 02/20/24  Yes Nakeesha Bowler, Emil Schanz, MD  propranolol  (INDERAL ) 10 MG tablet Take 10 mg by mouth 2 (two) times daily.   Yes [provider]  ZEPBOUND  7.5 MG/0.5ML Pen Inject 7.5 mg into the skin once a week. 03/05/24  Yes SagardiaEmil Schanz, MD  INVEGA  TRINZA 819 MG/2.63ML injection Inject 819 mg into the muscle every 3 (three) months.    [provider]   semaglutide -weight management (WEGOVY) 0.5 MG/0.5ML SOAJ SQ injection Inject 0.5 mg into the skin once a week. Patient not taking: Reported on 03/22/2024 12/09/23   [provider]  tirzepatide  (ZEPBOUND ) 2.5 MG/0.5ML Pen Inject 2.5 mg into the skin once a week. Increase dose to 5 mg weekly after 3 to 4 weeks if side effects tolerated Patient not taking: Reported on 03/22/2024 01/13/24   Purcell Emil Schanz, MD  tirzepatide  (ZEPBOUND ) 5 MG/0.5ML Pen Inject 5 mg into the skin once a week. 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 No pertinent surgical history.  Social History   Socioeconomic History  Marital status: Single    Spouse name: Not on file   Number of children: Not on file   Years of education: Not on file   Highest education level: Associate degree: occupational, scientist, product/process development, or vocational program  Occupational History   Occupation: disablied  Tobacco Use   Smoking status: Former    Current packs/day: 0.00    Types: Cigarettes    Quit date: 2023    Years since quitting: 2.9   Smokeless tobacco: Never  Vaping Use   Vaping status: Never Used  Substance and Sexual Activity    Alcohol use: Never   Drug use: Never   Sexual activity: Not on file  Other Topics Concern   Not on file  Social History Narrative   Lives with family   Social Drivers of Health   Financial Resource Strain: Low Risk  (03/21/2024)   Overall Financial Resource Strain (CARDIA)    Difficulty of Paying Living Expenses: Not hard at all  Food Insecurity: No Food Insecurity (03/21/2024)   Hunger Vital Sign    Worried About Running Out of Food in the Last Year: Never true    Ran Out of Food in the Last Year: Never true  Transportation Needs: No Transportation Needs (03/21/2024)   PRAPARE - Administrator, Civil Service (Medical): No    Lack of Transportation (Non-Medical): No  Physical Activity: Sufficiently Active (03/21/2024)   Exercise Vital Sign    Days of Exercise per Week: 4 days    Minutes of Exercise per Session: 40 min  Stress: Stress Concern Present (03/21/2024)   [employer] of Occupational Health - Occupational Stress Questionnaire    Feeling of Stress: To some extent  Social Connections: Moderately Isolated (03/21/2024)   Social Connection and Isolation Panel    Frequency of Communication with Friends and Family: More than three times a week    Frequency of Social Gatherings with Friends and Family: More than three times a week    Attends Religious Services: More than 4 times per year    Active Member of [employer] or Organizations: No    Attends Engineer, Structural: Not on file    Marital Status: Never married  Intimate Partner Violence: Not At Risk (08/18/2023)   Received from [facility]   HITS    Over the last 12 months how often did your partner physically hurt you?: Never    Over the last 12 months how often did your partner insult you or talk down to you?: Never    Over the last 12 months how often did your partner threaten you with physical harm?: Never    Over the last 12 months how often did your partner scream or curse at you?: Never     Family History  Problem Relation Age of Onset   Mental illness Brother      Review of Systems  Constitutional: Negative.  Negative for chills and fever.  HENT: Negative.  Negative for congestion and sore throat.   Respiratory: Negative.  Negative for cough and shortness of breath.   Cardiovascular: Negative.  Negative for chest pain and palpitations.  Gastrointestinal:  Negative for abdominal pain, diarrhea, nausea and vomiting.  Genitourinary: Negative.  Negative for dysuria and hematuria.  Skin: Negative.  Negative for rash.  Neurological: Negative.  Negative for dizziness and headaches.  All other systems reviewed and are negative.   Vitals:   03/22/24 1311  BP: 100/68  Pulse: (!) 110  Temp: 98.9 F (  37.2 C)  SpO2: 95%    Physical Exam Vitals reviewed.  Constitutional:      Appearance: Normal appearance.  HENT:     Head: Normocephalic.     Right Ear: Tympanic membrane, ear canal and external ear normal.     Left Ear: Tympanic membrane, ear canal and external ear normal.     Mouth/Throat:     Mouth: Mucous membranes are moist.     Pharynx: Oropharynx is clear.  Eyes:     Extraocular Movements: Extraocular movements intact.     Conjunctiva/sclera: Conjunctivae normal.     Pupils: Pupils are equal, round, and reactive to light.  Cardiovascular:     Rate and Rhythm: Normal rate and regular rhythm.     Pulses: Normal pulses.     Heart sounds: Normal heart sounds.  Pulmonary:     Effort: Pulmonary effort is normal.     Breath sounds: Normal breath sounds.  Abdominal:     Palpations: Abdomen is soft.     Tenderness: There is no abdominal tenderness.  Musculoskeletal:     Cervical back: No tenderness.  Lymphadenopathy:     Cervical: No cervical adenopathy.  Skin:    General: Skin is warm and dry.     Capillary Refill: Capillary refill takes less than 2 seconds.  Neurological:     General: No focal deficit present.     Mental Status: He is alert and  oriented to person, place, and time.  Psychiatric:        Mood and Affect: Mood normal.        Behavior: Behavior normal.      ASSESSMENT & PLAN: Problem List Items Addressed This Visit       Cardiovascular and Mediastinum   Hypertension   BP Readings from Last 3 Encounters:  03/22/24 100/68  01/06/24 (!) 128/90  12/08/23 128/84   Well-controlled hypertension today Had abnormal readings last week Advised to continue monitoring blood pressure readings daily for the next several weeks and contact the office if numbers persistently abnormal For now continue amlodipine  5 mg daily.  Advise he can go up to 10 mg daily as needed        Respiratory   OSA (obstructive sleep apnea)   Stable.  On CPAP treatment.        Other   Prediabetes   Cardiovascular risks associated with diabetes discussed Diet and nutrition discussed      Morbid obesity (HCC)   Diet and nutrition discussed Advised to decrease amount of daily carbohydrate intake and daily calories and increase amount of plant-based protein in his diet Benefits of exercise discussed Continues follow-up with nutritional clinic Presently on Zepbound .  Recommend to increase dose to 10 mg weekly      Relevant Medications   tirzepatide  (ZEPBOUND ) 10 MG/0.5ML Pen   Paranoid schizophrenia (HCC)   Clinically stable.  Sees psychiatrist on a regular basis. All medications handled by psychiatrist office      Other Visit Diagnoses       Encounter for general adult medical examination with abnormal findings    -  Primary   Relevant Orders   CBC with Differential/Platelet   Comprehensive metabolic panel with GFR   Hemoglobin A1c   Lipid panel     Screening for deficiency anemia       Relevant Orders   CBC with Differential/Platelet     Screening for lipoid disorders       Relevant Orders   Lipid panel  Screening for endocrine, metabolic and immunity disorder       Relevant Orders   Comprehensive metabolic panel  with GFR   Hemoglobin A1c      Modifiable risk factors discussed with patient. Anticipatory guidance according to age provided. The following topics were also discussed: Social Determinants of Health Smoking.  Non-smoker Diet and nutrition Benefits of exercise Cancer family history review Vaccinations review and recommendations Cardiovascular risk assessment and need for blood work Mental health including depression and anxiety Fall and accident prevention  Patient Instructions  Health Maintenance, Male Adopting a healthy lifestyle and getting preventive care are important in promoting health and wellness. Ask your health care provider about: The right schedule for you to have regular tests and exams. Things you can do on your own to prevent diseases and keep yourself healthy. What should I know about diet, weight, and exercise? Eat a healthy diet  Eat a diet that includes plenty of vegetables, fruits, low-fat dairy products, and lean protein. Do not eat a lot of foods that are high in solid fats, added sugars, or sodium. Maintain a healthy weight Body mass index (BMI) is a measurement that can be used to identify possible weight problems. It estimates body fat based on height and weight. Your health care provider can help determine your BMI and help you achieve or maintain a healthy weight. Get regular exercise Get regular exercise. This is one of the most important things you can do for your health. Most adults should: Exercise for at least 150 minutes each week. The exercise should increase your heart rate and make you sweat (moderate-intensity exercise). Do strengthening exercises at least twice a week. This is in addition to the moderate-intensity exercise. Spend less time sitting. Even light physical activity can be beneficial. Watch cholesterol and blood lipids Have your blood tested for lipids and cholesterol at 28 years of age, then have this test every 5 years. You may  need to have your cholesterol levels checked more often if: Your lipid or cholesterol levels are high. You are older than 28 years of age. You are at high risk for heart disease. What should I know about cancer screening? Many types of cancers can be detected early and may often be prevented. Depending on your health history and family history, you may need to have cancer screening at various ages. This may include screening for: Colorectal cancer. Prostate cancer. Skin cancer. Lung cancer. What should I know about heart disease, diabetes, and high blood pressure? Blood pressure and heart disease High blood pressure causes heart disease and increases the risk of stroke. This is more likely to develop in people who have high blood pressure readings or are overweight. Talk with your health care provider about your target blood pressure readings. Have your blood pressure checked: Every 3-5 years if you are 23-62 years of age. Every year if you are 61 years old or older. If you are between the ages of 55 and 27 and are a current or former smoker, ask your health care provider if you should have a one-time screening for abdominal aortic aneurysm (AAA). Diabetes Have regular diabetes screenings. This checks your fasting blood sugar level. Have the screening done: Once every three years after age 81 if you are at a normal weight and have a low risk for diabetes. More often and at a younger age if you are overweight or have a high risk for diabetes. What should I know about preventing infection? Hepatitis B If  you have a higher risk for hepatitis B, you should be screened for this virus. Talk with your health care provider to find out if you are at risk for hepatitis B infection. Hepatitis C Blood testing is recommended for: Everyone born from [birth year] through 1965. Anyone with known risk factors for hepatitis C. Sexually transmitted infections (STIs) You should be screened each year for STIs,  including gonorrhea and chlamydia, if: You are sexually active and are younger than 28 years of age. You are older than 28 years of age and your health care provider tells you that you are at risk for this type of infection. Your sexual activity has changed since you were last screened, and you are at increased risk for chlamydia or gonorrhea. Ask your health care provider if you are at risk. Ask your health care provider about whether you are at high risk for HIV. Your health care provider may recommend a prescription medicine to help prevent HIV infection. If you choose to take medicine to prevent HIV, you should first get tested for HIV. You should then be tested every 3 months for as long as you are taking the medicine. Follow these instructions at home: Alcohol use Do not drink alcohol if your health care provider tells you not to drink. If you drink alcohol: Limit how much you have to 0-2 drinks a day. Know how much alcohol is in your drink. In the [place] one drink equals one 12 oz bottle of beer (355 mL), one 5 oz glass of wine (148 mL), or one 1 oz glass of hard liquor (44 mL). Lifestyle Do not use any products that contain nicotine  or tobacco. These products include cigarettes, chewing tobacco, and vaping devices, such as e-cigarettes. If you need help quitting, ask your health care provider. Do not use street drugs. Do not share needles. Ask your health care provider for help if you need support or information about quitting drugs. General instructions Schedule regular health, dental, and eye exams. Stay current with your vaccines. Tell your health care provider if: You often feel depressed. You have ever been abused or do not feel safe at home. Summary Adopting a healthy lifestyle and getting preventive care are important in promoting health and wellness. Follow your health care provider's instructions about healthy diet, exercising, and getting tested or screened for  diseases. Follow your health care provider's instructions on monitoring your cholesterol and blood pressure. This information is not intended to replace advice given to you by your health care provider. Make sure you discuss any questions you have with your health care provider. Document Revised: 08/28/2020 Document Reviewed: 08/28/2020 Elsevier Patient Education  2024 Elsevier Inc.     Emil Schaumann, MD [facility]

## 2024-03-22 NOTE — Patient Instructions (Signed)
 Health Maintenance, Male  Adopting a healthy lifestyle and getting preventive care are important in promoting health and wellness. Ask your health care provider about:  The right schedule for you to have regular tests and exams.  Things you can do on your own to prevent diseases and keep yourself healthy.  What should I know about diet, weight, and exercise?  Eat a healthy diet    Eat a diet that includes plenty of vegetables, fruits, low-fat dairy products, and lean protein.  Do not eat a lot of foods that are high in solid fats, added sugars, or sodium.  Maintain a healthy weight  Body mass index (BMI) is a measurement that can be used to identify possible weight problems. It estimates body fat based on height and weight. Your health care provider can help determine your BMI and help you achieve or maintain a healthy weight.  Get regular exercise  Get regular exercise. This is one of the most important things you can do for your health. Most adults should:  Exercise for at least 150 minutes each week. The exercise should increase your heart rate and make you sweat (moderate-intensity exercise).  Do strengthening exercises at least twice a week. This is in addition to the moderate-intensity exercise.  Spend less time sitting. Even light physical activity can be beneficial.  Watch cholesterol and blood lipids  Have your blood tested for lipids and cholesterol at 28 years of age, then have this test every 5 years.  You may need to have your cholesterol levels checked more often if:  Your lipid or cholesterol levels are high.  You are older than 28 years of age.  You are at high risk for heart disease.  What should I know about cancer screening?  Many types of cancers can be detected early and may often be prevented. Depending on your health history and family history, you may need to have cancer screening at various ages. This may include screening for:  Colorectal cancer.  Prostate cancer.  Skin cancer.  Lung  cancer.  What should I know about heart disease, diabetes, and high blood pressure?  Blood pressure and heart disease  High blood pressure causes heart disease and increases the risk of stroke. This is more likely to develop in people who have high blood pressure readings or are overweight.  Talk with your health care provider about your target blood pressure readings.  Have your blood pressure checked:  Every 3-5 years if you are 28-28 years of age.  Every year if you are 3 years old or older.  If you are between the ages of 60 and 72 and are a current or former smoker, ask your health care provider if you should have a one-time screening for abdominal aortic aneurysm (AAA).  Diabetes  Have regular diabetes screenings. This checks your fasting blood sugar level. Have the screening done:  Once every three years after age 66 if you are at a normal weight and have a low risk for diabetes.  More often and at a younger age if you are overweight or have a high risk for diabetes.  What should I know about preventing infection?  Hepatitis B  If you have a higher risk for hepatitis B, you should be screened for this virus. Talk with your health care provider to find out if you are at risk for hepatitis B infection.  Hepatitis C  Blood testing is recommended for:  Everyone born from [birth year] through 1965.  Anyone  with known risk factors for hepatitis C.  Sexually transmitted infections (STIs)  You should be screened each year for STIs, including gonorrhea and chlamydia, if:  You are sexually active and are younger than 28 years of age.  You are older than 28 years of age and your health care provider tells you that you are at risk for this type of infection.  Your sexual activity has changed since you were last screened, and you are at increased risk for chlamydia or gonorrhea. Ask your health care provider if you are at risk.  Ask your health care provider about whether you are at high risk for HIV. Your health care provider  may recommend a prescription medicine to help prevent HIV infection. If you choose to take medicine to prevent HIV, you should first get tested for HIV. You should then be tested every 3 months for as long as you are taking the medicine.  Follow these instructions at home:  Alcohol use  Do not drink alcohol if your health care provider tells you not to drink.  If you drink alcohol:  Limit how much you have to 0-2 drinks a day.  Know how much alcohol is in your drink. In the [place] one drink equals one 12 oz bottle of beer (355 mL), one 5 oz glass of wine (148 mL), or one 1 oz glass of hard liquor (44 mL).  Lifestyle  Do not use any products that contain nicotine or tobacco. These products include cigarettes, chewing tobacco, and vaping devices, such as e-cigarettes. If you need help quitting, ask your health care provider.  Do not use street drugs.  Do not share needles.  Ask your health care provider for help if you need support or information about quitting drugs.  General instructions  Schedule regular health, dental, and eye exams.  Stay current with your vaccines.  Tell your health care provider if:  You often feel depressed.  You have ever been abused or do not feel safe at home.  Summary  Adopting a healthy lifestyle and getting preventive care are important in promoting health and wellness.  Follow your health care provider's instructions about healthy diet, exercising, and getting tested or screened for diseases.  Follow your health care provider's instructions on monitoring your cholesterol and blood pressure.  This information is not intended to replace advice given to you by your health care provider. Make sure you discuss any questions you have with your health care provider.  Document Revised: 08/28/2020 Document Reviewed: 08/28/2020  Elsevier Patient Education  2024 [employer].

## 2024-03-22 NOTE — Assessment & Plan Note (Signed)
 Diet and nutrition discussed Advised to decrease amount of daily carbohydrate intake and daily calories and increase amount of plant-based protein in his diet Benefits of exercise discussed Continues follow-up with nutritional clinic Presently on Zepbound .  Recommend to increase dose to 10 mg weekly

## 2024-03-22 NOTE — Assessment & Plan Note (Signed)
Cardiovascular risks associated with diabetes discussed Diet and nutrition discussed.

## 2024-03-22 NOTE — Assessment & Plan Note (Signed)
 Clinically stable.  Sees psychiatrist on a regular basis. All medications handled by psychiatrist office

## 2024-03-22 NOTE — Assessment & Plan Note (Signed)
Stable.  On CPAP treatment. 

## 2024-03-29 ENCOUNTER — Other Ambulatory Visit (HOSPITAL_COMMUNITY): Payer: Self-pay

## 2024-03-29 ENCOUNTER — Other Ambulatory Visit: Payer: Self-pay

## 2024-03-29 MED ORDER — ZEPBOUND 10 MG/0.5ML ~~LOC~~ SOAJ: 10.0000 mg | SUBCUTANEOUS | 3 refills | Status: DC

## 2024-03-30 ENCOUNTER — Other Ambulatory Visit: Payer: Self-pay | Admitting: Emergency Medicine

## 2024-04-06 ENCOUNTER — Encounter: Payer: Self-pay | Admitting: Pulmonary Disease

## 2024-04-06 ENCOUNTER — Ambulatory Visit: Admitting: Pulmonary Disease

## 2024-04-06 VITALS — BP 126/83 | HR 108 | Ht 67.5 in | Wt 355.0 lb

## 2024-04-06 DIAGNOSIS — G4733 Obstructive sleep apnea (adult) (pediatric): Secondary | ICD-10-CM

## 2024-04-06 NOTE — Patient Instructions (Signed)
 Follow-up in about 5 to 6 months  The download from your machine shows it is working well, controlling the sleep apnea events very well  Continue weight loss efforts  Call us  with significant concerns

## 2024-04-06 NOTE — Progress Notes (Signed)
 Noah Ramsey    [id number]    [birth year]  Primary Care Physician:Sagardia, Emil Schanz, MD  Referring Physician: Purcell Emil Schanz, MD [address] [place],  [place] [address]  Chief complaint:   Follow-up for severe obstructive sleep apnea  HPI:  Patient diagnosed with severe obstructive sleep apnea with AHI of 47, has been using CPAP for a few weeks with significant improvement in symptoms  History of gasping respiration, snoring, wheezing  Sleep study shows severe obstructive sleep apnea with severe oxygen desaturation she is good to go  Takes trazodone  to help him sleep He has been on Mounjaro  and has lost over 25 pounds  He is feeling relatively well overall  Outpatient Encounter Medications as of 04/06/2024  Medication Sig   COBENFY 100-20 MG CAPS Take by mouth.   Accu-Chek Softclix Lancets lancets Use as instructed   amLODipine  (NORVASC ) 5 MG tablet TAKE 1 TABLET (5 MG TOTAL) BY MOUTH DAILY.   benztropine  (COGENTIN ) 0.5 MG tablet Take 1 tablet (0.5 mg total) by mouth 2 (two) times daily for 14 days. (Patient not taking: Reported on 04/06/2024)   Blood Glucose Monitoring Suppl (ACCU-CHEK GUIDE) w/Device KIT USE GLUCOSE MONITOR DAILY AS NEEDED TO CHECK SUGAR LEVELS   buPROPion HCl ER, XL, 450 MG TB24 Take 1 tablet by mouth every morning.   Cholecalciferol  (VITAMIN D3) 250 MCG (10000 UT) capsule Take 10,000 Units by mouth daily.   glucose blood (ACCU-CHEK GUIDE TEST) test strip Use test strips 3 times daily as needed to monitor blood sugar levels   metFORMIN  (GLUCOPHAGE ) 1000 MG tablet TAKE 1 TABLET BY MOUTH 2 TIMES A DAY WITH A MEAL   propranolol  (INDERAL ) 10 MG tablet Take 10 mg by mouth 2 (two) times daily.   tirzepatide  (ZEPBOUND ) 10 MG/0.5ML Pen Inject 10 mg into the skin once a week.   No facility-administered encounter medications on file as of 04/06/2024.    Allergies as of 04/06/2024   (No Known Allergies)    Past Medical History:   Diagnosis Date   Complication of anesthesia    Elevated CPK    per patient   Schizophrenia (HCC)     No past surgical history on file.  Family History  Problem Relation Age of Onset   Mental illness Brother     Social History   Socioeconomic History   Marital status: Single    Spouse name: Not on file   Number of children: Not on file   Years of education: Not on file   Highest education level: Associate degree: occupational, scientist, product/process development, or vocational program  Occupational History   Occupation: disablied  Tobacco Use   Smoking status: Former    Current packs/day: 0.00    Types: Cigarettes    Quit date: 2023    Years since quitting: 2.9   Smokeless tobacco: Never  Vaping Use   Vaping status: Never Used  Substance and Sexual Activity   Alcohol use: Never   Drug use: Never   Sexual activity: Not on file  Other Topics Concern   Not on file  Social History Narrative   Lives with family   Social Drivers of Health   Tobacco Use: Medium Risk (04/06/2024)   Patient History    Smoking Tobacco Use: Former    Smokeless Tobacco Use: Never    Passive Exposure: Not on file  Financial Resource Strain: Low Risk (03/21/2024)   Overall Financial Resource Strain (CARDIA)    Difficulty of  Paying Living Expenses: Not hard at all  Food Insecurity: No Food Insecurity (03/21/2024)   Epic    Worried About Programme Researcher, Broadcasting/film/video in the Last Year: Never true    Ran Out of Food in the Last Year: Never true  Transportation Needs: No Transportation Needs (03/21/2024)   Epic    Lack of Transportation (Medical): No    Lack of Transportation (Non-Medical): No  Physical Activity: Sufficiently Active (03/21/2024)   Exercise Vital Sign    Days of Exercise per Week: 4 days    Minutes of Exercise per Session: 40 min  Stress: Stress Concern Present (03/21/2024)   [employer] of Occupational Health - Occupational Stress Questionnaire    Feeling of Stress: To some extent  Social  Connections: Moderately Isolated (03/21/2024)   Social Connection and Isolation Panel    Frequency of Communication with Friends and Family: More than three times a week    Frequency of Social Gatherings with Friends and Family: More than three times a week    Attends Religious Services: More than 4 times per year    Active Member of [employer] or Organizations: No    Attends Engineer, Structural: Not on file    Marital Status: Never married  Intimate Partner Violence: Not At Risk (08/18/2023)   Received from [facility]   HITS    Over the last 12 months how often did your partner physically hurt you?: Never    Over the last 12 months how often did your partner insult you or talk down to you?: Never    Over the last 12 months how often did your partner threaten you with physical harm?: Never    Over the last 12 months how often did your partner scream or curse at you?: Never  Depression (PHQ2-9): High Risk (12/08/2023)   Depression (PHQ2-9)    PHQ-2 Score: 13  Alcohol Screen: Low Risk (10/09/2023)   Alcohol Screen    Last Alcohol Screening Score (AUDIT): 1  Housing: Low Risk (03/21/2024)   Epic    Unable to Pay for Housing in the Last Year: No    Number of Times Moved in the Last Year: 1    Homeless in the Last Year: No  Utilities: Not At Risk (08/18/2023)   Received from [facility] Utilities    Threatened with loss of utilities: No  Health Literacy: Not on file    Review of Systems  Constitutional:  Negative for fatigue.  Respiratory:  Positive for apnea.   Psychiatric/Behavioral:  Positive for sleep disturbance.     Vitals:   04/06/24 1344  BP: 126/83  Pulse: (!) 108  SpO2: 95%     Physical Exam Constitutional:      Appearance: He is obese.  HENT:     Nose: No congestion.     Mouth/Throat:     Mouth: Mucous membranes are moist.  Eyes:     General: No scleral icterus. Cardiovascular:     Rate and Rhythm: Normal rate and regular rhythm.     Heart  sounds: No murmur heard.    No friction rub.  Pulmonary:     Effort: No respiratory distress.     Breath sounds: No stridor. No wheezing or rhonchi.  Musculoskeletal:     Cervical back: No rigidity or tenderness.  Skin:    General: Skin is warm.  Neurological:     General: No focal deficit present.     Mental Status: He  is alert.  Psychiatric:        Mood and Affect: Mood normal.    Data Reviewed: Sleep study was reviewed showing severe obstructive sleep apnea with severe oxygen desaturations  Compliance data reviewed showing excellent compliance AutoSet 5-20 95 percentile pressure of 11.4 AHI of 2.2    Assessment/Plan: Severe obstructive sleep apnea adequately treated with CPAP therapy  Class III obesity - On Mounjaro  - Is achieving adequate weight loss  Hypertension - Controlled  Schizophrenia - Controlled symptoms  Bipolar disorder - Adequately treated   Follow-up in about 4 to 5 months  Pathophysiology of sleep disordered breathing discussed with the patient Treatment options discussed with the patient  He seems to be tolerating CPAP well and feeling better with CPAP use    Jennet Epley MD [facility] 04/06/2024, 2:21 PM  CC: Purcell Emil Schanz, MD

## 2024-04-22 ENCOUNTER — Encounter: Payer: Self-pay | Admitting: Emergency Medicine

## 2024-04-29 MED ORDER — ZEPBOUND 15 MG/0.5ML ~~LOC~~ SOAJ: 15.0000 mg | SUBCUTANEOUS | 0 refills | Status: DC

## 2024-04-30 ENCOUNTER — Telehealth: Payer: Self-pay

## 2024-04-30 NOTE — Telephone Encounter (Signed)
 Received CMN from [employer] for CPAP supplies. Placed in Dr. Lavena sign folder. Once signed, will be faxed back to [employer] at [phone]

## 2024-05-05 ENCOUNTER — Other Ambulatory Visit: Payer: Self-pay | Admitting: Emergency Medicine

## 2024-05-05 MED ORDER — ZEPBOUND 10 MG/0.5ML ~~LOC~~ SOAJ: 10.0000 mg | SUBCUTANEOUS | 3 refills | Status: AC

## 2024-05-05 NOTE — Telephone Encounter (Signed)
 Copied from CRM [id number]. Topic: Clinical - Medication Refill >> May 05, 2024  4:03 PM Zebedee SAUNDERS wrote: Medication: tirzepatide  (ZEPBOUND ) 10 MG/0.5ML Pen  Has the patient contacted their pharmacy? Yes (Agent: If no, request that the patient contact the pharmacy for the refill. If patient does not wish to contact the pharmacy document the reason why and proceed with request.) (Agent: If yes, when and what did the pharmacy advise?)  This is the patient's preferred pharmacy:   [facility] [place], [place] - [address] [place] [address] Phone: [phone] Fax: [phone]  Is this the correct pharmacy for this prescription? Yes If no, delete pharmacy and type the correct one.   Has the prescription been filled recently? Yes  Is the patient out of the medication? Yes  Has the patient been seen for an appointment in the last year OR does the patient have an upcoming appointment? Yes  Can we respond through MyChart? Yes  Agent: Please be advised that Rx refills may take up to 3 business days. We ask that you follow-up with your pharmacy.

## 2024-05-05 NOTE — Addendum Note (Signed)
 Addended by: ULLA AQUAS A on: 05/05/2024 04:13 PM   Modules accepted: Orders

## 2024-05-06 ENCOUNTER — Telehealth: Payer: Self-pay | Admitting: Emergency Medicine

## 2024-05-06 NOTE — Telephone Encounter (Signed)
 Copied from CRM [id number]. Topic: Clinical - Medication Prior Auth >> May 05, 2024  4:03 PM Noah Ramsey wrote: Reason for CRM: Pt stated pharmacy need prior authorization for tirzepatide  (ZEPBOUND ) 10 MG/0.5ML Pen. [facility] [place], [place] - [address] [place] [address] Phone: [phone] Fax: [phone]

## 2024-05-10 ENCOUNTER — Other Ambulatory Visit (HOSPITAL_COMMUNITY): Payer: Self-pay

## 2024-05-10 ENCOUNTER — Telehealth: Payer: Self-pay

## 2024-05-10 NOTE — Telephone Encounter (Signed)
 Pharmacy Patient Advocate Encounter   Received notification from CoverMyMeds that prior authorization for Zepbound  10mg /0.31mlis required/requested.   Insurance verification completed.   The patient is insured through [facility].   Per test claim: PA required; PA submitted to above mentioned insurance via Latent Key/confirmation #/EOC [id number] Status is pending

## 2024-05-10 NOTE — Telephone Encounter (Signed)
 Pharmacy Patient Advocate Encounter  Received notification from [facility] MEDICARE that Prior Authorization for Zepbound  10mg /0.90ml has been APPROVED from 05/10/24 to 04/21/25   PA #/Case ID/Reference #: [id number]

## 2024-05-13 NOTE — Telephone Encounter (Signed)
 Faxed back to [employer] at [phone]

## 2024-05-14 ENCOUNTER — Ambulatory Visit (HOSPITAL_COMMUNITY)
Admission: EM | Admit: 2024-05-14 | Discharge: 2024-05-15 | Disposition: A | Attending: Nurse Practitioner | Admitting: Nurse Practitioner

## 2024-05-14 ENCOUNTER — Other Ambulatory Visit: Payer: Self-pay

## 2024-05-14 DIAGNOSIS — F332 Major depressive disorder, recurrent severe without psychotic features: Secondary | ICD-10-CM | POA: Diagnosis not present

## 2024-05-14 DIAGNOSIS — E785 Hyperlipidemia, unspecified: Secondary | ICD-10-CM | POA: Diagnosis not present

## 2024-05-14 DIAGNOSIS — R000 Tachycardia, unspecified: Secondary | ICD-10-CM

## 2024-05-14 DIAGNOSIS — F411 Generalized anxiety disorder: Secondary | ICD-10-CM | POA: Diagnosis not present

## 2024-05-14 DIAGNOSIS — I4510 Unspecified right bundle-branch block: Secondary | ICD-10-CM

## 2024-05-14 DIAGNOSIS — R45851 Suicidal ideations: Secondary | ICD-10-CM | POA: Insufficient documentation

## 2024-05-14 DIAGNOSIS — R739 Hyperglycemia, unspecified: Secondary | ICD-10-CM | POA: Insufficient documentation

## 2024-05-14 LAB — CBC WITH DIFFERENTIAL/PLATELET
Abs Immature Granulocytes: 0.01 K/uL (ref 0.00–0.07)
Basophils Absolute: 0 K/uL (ref 0.0–0.1)
Basophils Relative: 0 %
Eosinophils Absolute: 0.1 K/uL (ref 0.0–0.5)
Eosinophils Relative: 2 %
HCT: 40.3 % (ref 39.0–52.0)
Hemoglobin: 13.9 g/dL (ref 13.0–17.0)
Immature Granulocytes: 0 %
Lymphocytes Relative: 43 %
Lymphs Abs: 2.1 K/uL (ref 0.7–4.0)
MCH: 31.7 pg (ref 26.0–34.0)
MCHC: 34.5 g/dL (ref 30.0–36.0)
MCV: 91.8 fL (ref 80.0–100.0)
Monocytes Absolute: 0.3 K/uL (ref 0.1–1.0)
Monocytes Relative: 6 %
Neutro Abs: 2.4 K/uL (ref 1.7–7.7)
Neutrophils Relative %: 49 %
Platelets: 219 K/uL (ref 150–400)
RBC: 4.39 MIL/uL (ref 4.22–5.81)
RDW: 13.1 % (ref 11.5–15.5)
WBC: 4.9 K/uL (ref 4.0–10.5)
nRBC: 0 % (ref 0.0–0.2)

## 2024-05-14 LAB — ETHANOL: Alcohol, Ethyl (B): <15 mg/dL

## 2024-05-14 LAB — POCT URINE DRUG SCREEN - MANUAL ENTRY (I-SCREEN)
POC Amphetamine UR: NOT DETECTED
POC Buprenorphine (BUP): NOT DETECTED
POC Cocaine UR: NOT DETECTED
POC Marijuana UR: NOT DETECTED
POC Methadone UR: NOT DETECTED
POC Methamphetamine UR: NOT DETECTED
POC Morphine: NOT DETECTED
POC Oxazepam (BZO): NOT DETECTED
POC Oxycodone UR: NOT DETECTED
POC Secobarbital (BAR): NOT DETECTED

## 2024-05-14 LAB — COMPREHENSIVE METABOLIC PANEL WITH GFR
ALT: 50 U/L — ABNORMAL HIGH (ref 0–44)
AST: 42 U/L — ABNORMAL HIGH (ref 15–41)
Albumin: 4.1 g/dL (ref 3.5–5.0)
Alkaline Phosphatase: 72 U/L (ref 38–126)
Anion gap: 13 (ref 5–15)
BUN: 14 mg/dL (ref 6–20)
CO2: 25 mmol/L (ref 22–32)
Calcium: 9.3 mg/dL (ref 8.9–10.3)
Chloride: 99 mmol/L (ref 98–111)
Creatinine, Ser: 1.04 mg/dL (ref 0.61–1.24)
GFR, Estimated: >60 mL/min
Glucose, Bld: 111 mg/dL — ABNORMAL HIGH (ref 70–99)
Potassium: 3.6 mmol/L (ref 3.5–5.1)
Sodium: 137 mmol/L (ref 135–145)
Total Bilirubin: 0.3 mg/dL (ref 0.0–1.2)
Total Protein: 7.9 g/dL (ref 6.5–8.1)

## 2024-05-14 LAB — URINALYSIS, ROUTINE W REFLEX MICROSCOPIC
Bilirubin Urine: NEGATIVE
Glucose, UA: NEGATIVE mg/dL
Hgb urine dipstick: NEGATIVE
Ketones, ur: NEGATIVE mg/dL
Leukocytes,Ua: NEGATIVE
Nitrite: NEGATIVE
Protein, ur: NEGATIVE mg/dL
Specific Gravity, Urine: 1.02 (ref 1.005–1.030)
pH: 6 (ref 5.0–8.0)

## 2024-05-14 LAB — LIPID PANEL
Cholesterol: 140 mg/dL (ref 0–200)
HDL: 40 mg/dL — ABNORMAL LOW
LDL Cholesterol: 63 mg/dL (ref 0–99)
Total CHOL/HDL Ratio: 3.5 ratio
Triglycerides: 182 mg/dL — ABNORMAL HIGH
VLDL: 36 mg/dL (ref 0–40)

## 2024-05-14 LAB — TSH: TSH: 2.08 u[IU]/mL (ref 0.350–4.500)

## 2024-05-14 LAB — MAGNESIUM: Magnesium: 1.7 mg/dL (ref 1.7–2.4)

## 2024-05-14 MED ORDER — TRAZODONE HCL 50 MG PO TABS
50.0000 mg | ORAL_TABLET | Freq: Every evening | ORAL | Status: DC | PRN
  Administered 2024-05-14: 50 mg via ORAL
  Filled 2024-05-14: qty 1

## 2024-05-14 MED ORDER — DIPHENHYDRAMINE HCL 50 MG/ML IJ SOLN: 50.0000 mg | Freq: Three times a day (TID) | INTRAMUSCULAR | Status: DC | PRN

## 2024-05-14 MED ORDER — ACETAMINOPHEN 325 MG PO TABS: 650.0000 mg | ORAL_TABLET | Freq: Four times a day (QID) | ORAL | Status: DC | PRN

## 2024-05-14 MED ORDER — HYDROXYZINE HCL 25 MG PO TABS
25.0000 mg | ORAL_TABLET | Freq: Three times a day (TID) | ORAL | Status: DC | PRN
  Administered 2024-05-14: 25 mg via ORAL
  Filled 2024-05-14: qty 1

## 2024-05-14 MED ORDER — HALOPERIDOL 5 MG PO TABS: 5.0000 mg | ORAL_TABLET | Freq: Three times a day (TID) | ORAL | Status: DC | PRN

## 2024-05-14 MED ORDER — DIPHENHYDRAMINE HCL 50 MG PO CAPS: 50.0000 mg | ORAL_CAPSULE | Freq: Three times a day (TID) | ORAL | Status: DC | PRN

## 2024-05-14 MED ORDER — MAGNESIUM HYDROXIDE 400 MG/5ML PO SUSP: 30.0000 mL | Freq: Every day | ORAL | Status: DC | PRN

## 2024-05-14 MED ORDER — HALOPERIDOL LACTATE 5 MG/ML IJ SOLN: 5.0000 mg | Freq: Three times a day (TID) | INTRAMUSCULAR | Status: DC | PRN

## 2024-05-14 MED ORDER — ALUM & MAG HYDROXIDE-SIMETH 200-200-20 MG/5ML PO SUSP: 30.0000 mL | ORAL | Status: DC | PRN

## 2024-05-14 MED ORDER — HALOPERIDOL LACTATE 5 MG/ML IJ SOLN: 10.0000 mg | Freq: Three times a day (TID) | INTRAMUSCULAR | Status: DC | PRN

## 2024-05-14 MED ORDER — LORAZEPAM 2 MG/ML IJ SOLN: 2.0000 mg | Freq: Three times a day (TID) | INTRAMUSCULAR | Status: DC | PRN

## 2024-05-14 MED ORDER — AMLODIPINE BESYLATE 5 MG PO TABS
5.0000 mg | ORAL_TABLET | Freq: Every day | ORAL | Status: DC
  Administered 2024-05-14: 5 mg via ORAL
  Filled 2024-05-14: qty 1
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
## 2024-05-14 NOTE — Telephone Encounter (Signed)
 Fax confirmation received, [facility]

## 2024-05-14 NOTE — ED Provider Notes (Signed)
 [facility] Continuous Assessment Admission H&P  Date: 05/14/24 Patient Name: Noah Ramsey MRN: [id number] Chief Complaint: SI with plan  Diagnoses:  Final diagnoses:  MDD (major depressive disorder), recurrent severe, without psychosis (HCC)  GAD (generalized anxiety disorder)    HPI: Noah Ramsey is a 29 y.o. AA male with a prior diagnosis of Schizophrenia (questionable after my assessment) & a medical h/o sleep apnea (uses CPAP @ home), who presents to the [facility] accompanied by his mother with complaints of worsening depressive symptoms & suicidal ideations, with a plan to slit both wrists, and lie on my bed and bleed out to death.  Assessment & ROS: On assessment, pt shares that his suicidal ideations have been intermittent over the past several months, but he started to have a plan to slit his wrists yesterday and lie on his bed and bleed out to death. He shared that his plans intensified today, which is why he told his mother leading to this presentation.  Pt reports feeling like he could not handle the pressure any more, states that he had intrusive thoughts to harm himself, kept having shame about how my illness has ruined my body, and I feel like I should be the one sitting right there asking you these questions right now, and you should be the patient sitting right here. I am not jealous or anything like that. Pt goes on to explain that he is disappointment at the point in which his life is thus far, with little achievements due to his mental illness, then talks about being at peace if he were to no longer be in existence.   Pt reports depressive symptoms including anhedonia, reports that his sleep has diminished from 8 to 10 hours per night to 4 to 5 hours nightly.  He reports trouble enjoying things that typically make him happy such as exercising, and going to the gym.  He reports feelings of guilt for not making his mother proud, reports mental clouding,  worsening feelings of hopelessness, worthlessness, helplessness.  Patient also reports that symptoms consistent with anxiety have worsened over the past at least 2 weeks; he reports restlessness, feeling on edge, muscle tightness, and tension, and trouble with concentration.  Reports that the last auditory hallucinations were he at the time of his last presentation to the [facility] behavioral [facility] in 2024.  Denies visual hallucinations, denies paranoia, denies first rank symptoms, denies delusional thinking.  There are no overt signs of psychosis, patient does not seem to be responding to any internal/external stimuli.  Denies self injures behaviors, denies symptoms consistent with any other mental health conditions, denies substance use, but states that he has a history of abusing workout supplements, but has not done so in 3 years.  Pt reports 3 prior suicide attempts, does not state what he did the first 2 times, but shares that the last time was in 2024, and he starved himself of food and water .  Reports that current medication is COBENFY 100-20 MG CAPS, states that this medication was prescribed in November 2024, after the Haldol  LAI was discontinued, reports that he has an assertive community treatment team, via [facility], with Dr. Musa Akintayo as the Attending Psychiatrist. States that they do take care of him and check on him frequently.  Patient also reports that he takes Inderal  10 mg twice daily for anxiety, but states that it is a as needed medication.  Metformin  1000 mg twice daily is listed as a home medication, but it  is usually listed patient takes Zepbound , so the Metformin  will be deferred for verification prior to initiation. Norvasc  5 mg daily is listed as a home medication. Will order this as well.   Pt reports that he was born and raised in [place] in [place], his family moved initially to [place] in 2016 after his mother's home works for close upon, they moved similar  to [place] , prior to moving to [place], [place]  in 2019.  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  He reports that he currently resides with his mother, and is the last of 4 brothers, and his other 3 brothers are ages 46, 62, and 58.  He reports that his family is supportive of him.  Suicide Risk Assessment  SUICIDE RISK: High Risk:  Frequent, intense, and enduring suicidal ideation, specific plan prior to hospitalization, no subjective intent, but some objective markers of intent (i.e., choice of lethal method), the method is accessible, some limited preparatory behavior, there is some evidence self-control as verbalized by patient, but he is also presenting with severe dysphoria/symptomatology, and there are multiple risk factors present (prior attempts, mental illness history, current mental status), and few if any protective factors that patient can identify at this time. He states that he is unable to identify one, is not afraid to do it, and will not verbally contract for safety outside of a controlled environment.  Plan: Based on my evaluation the patient appears to have an emergency mental health condition for which I recommend the patient be transferred to an inpatient behavioral health unit for treatment and stabilization.  I certify that inpatient hospitalization would reasonably improve patient's mental status.   Total Time spent with patient: 1.5 hours  Musculoskeletal  Strength & Muscle Tone: within normal limits Gait & Station: normal Patient leans: N/A  Psychiatric Specialty Exam  Presentation General Appearance: Casual  Eye Contact:Fair  Speech:Clear and Coherent  Speech Volume:Normal  Handedness:Right   Mood and Affect  Mood:Hopeless; Depressed; Anxious  Affect:Congruent   Thought Process   Thought Processes:Coherent  Descriptions of Associations:Intact  Orientation:Full (Time, Place and Person)  Thought Content:Logical  Diagnosis of Schizophrenia or Schizoaffective disorder in past: Yes  Duration of Psychotic Symptoms: Greater than six months  Hallucinations:Hallucinations: None  Ideas of Reference:None  Suicidal Thoughts:Suicidal Thoughts: Yes, Active SI Active Intent and/or Plan: With Plan  Homicidal Thoughts:Homicidal Thoughts: No   Sensorium  Memory:Immediate Fair; Immediate Good  Judgment:Good  Insight:Good   Executive Functions  Concentration:Good  Attention Span:Good  Recall:Good  Fund of Knowledge:Good  Language:Good   Psychomotor Activity  Psychomotor Activity:Psychomotor Activity: Normal   Assets  Assets:Communication Skills   Sleep  Sleep:Sleep: Poor   Nutritional Assessment (For OBS and FBC admissions only) Has the patient had a weight loss or gain of 10 pounds or more in the last 3 months?: No Has the patient had a decrease in food intake/or appetite?: No Does the patient have dental problems?: No Does the patient have eating habits or behaviors that may be indicators of an eating disorder including binging or inducing vomiting?: No Has the patient recently lost weight without trying?: 0    Physical Exam Vitals reviewed.  Constitutional:      Appearance: He is obese.  Eyes:     Pupils: Pupils are equal, round, and reactive to light.  Neurological:     General: No focal deficit present.     Mental Status: He is alert and oriented to person, place, and time.    Review of Systems  Psychiatric/Behavioral:  Positive for depression and suicidal ideas. Negative for hallucinations, memory loss and substance abuse. The patient is nervous/anxious and has insomnia.     Blood pressure (!) 145/80, pulse 85, temperature 98.7 F (37.1 C), temperature source Oral, resp. rate 18, SpO2 98%. There is no height or weight on file  to calculate BMI.  Past Psychiatric History: Schizophrenia    Is the patient at risk to self? Yes  Has the patient been a risk to self in the past 6 months? Yes .    Has the patient been a risk to self within the distant past? Yes   Is the patient a risk to others? No   Has the patient been a risk to others in the past 6 months? No   Has the patient been a risk to others within the distant past? No   Past Medical History: sleep apnea, hypertension  Family History:  Family History  Problem Relation Age of Onset   Mental illness Brother      Social History:  Social History   Socioeconomic History   Marital status: Single    Spouse name: Not on file   Number of children: Not on file   Years of education: Not on file   Highest education level: Associate degree: occupational, scientist, product/process development, or vocational program  Occupational History   Occupation: disablied  Tobacco Use   Smoking status: Former    Current packs/day: 0.00    Types: Cigarettes    Quit date: 2023    Years since quitting: 3.0   Smokeless tobacco: Never  Vaping Use   Vaping status: Never Used  Substance and Sexual Activity   Alcohol use: Never   Drug use: Never   Sexual activity: Not on file  Other Topics Concern   Not on file  Social History Narrative   Lives with family   Social Drivers of Health   Tobacco Use: Low Risk (05/05/2024)   Received from [facility]   Patient History    Smoking Tobacco Use: Never    Smokeless Tobacco Use: Never    Passive Exposure: Never  Recent Concern: Tobacco Use - Medium Risk (04/06/2024)   Patient History    Smoking Tobacco Use: Former    Smokeless Tobacco Use: Never    Passive Exposure: Not on Actuary Strain: Low Risk (03/21/2024)   Overall Financial Resource Strain (CARDIA)    Difficulty of Paying Living Expenses: Not hard at all  Food Insecurity: No Food Insecurity (03/21/2024)   Epic    Worried About Programme Researcher, Broadcasting/film/video in the Last Year:  Never true    Ran Out of Food in the Last Year: Never true  Transportation Needs: No Transportation Needs (03/21/2024)   Epic    Lack of Transportation (Medical): No    Lack of Transportation (Non-Medical): No  Physical Activity: Sufficiently Active (03/21/2024)   Exercise Vital Sign    Days of Exercise per Week: 4 days    Minutes of Exercise per Session: 40 min  Stress: Stress Concern Present (03/21/2024)   [employer] of Occupational Health - Occupational Stress Questionnaire    Feeling of Stress: To some extent  Social Connections: Moderately Isolated (03/21/2024)   Social Connection and Isolation Panel    Frequency of Communication with Friends  and Family: More than three times a week    Frequency of Social Gatherings with Friends and Family: More than three times a week    Attends Religious Services: More than 4 times per year    Active Member of [employer] or Organizations: No    Attends Banker Meetings: Not on file    Marital Status: Never married  Intimate Partner Violence: Not At Risk (08/18/2023)   Received from [facility]   HITS    Over the last 12 months how often did your partner physically hurt you?: Never    Over the last 12 months how often did your partner insult you or talk down to you?: Never    Over the last 12 months how often did your partner threaten you with physical harm?: Never    Over the last 12 months how often did your partner scream or curse at you?: Never  Depression (PHQ2-9): High Risk (12/08/2023)   Depression (PHQ2-9)    PHQ-2 Score: 13  Alcohol Screen: Low Risk (10/09/2023)   Alcohol Screen    Last Alcohol Screening Score (AUDIT): 1  Housing: Low Risk (03/21/2024)   Epic    Unable to Pay for Housing in the Last Year: No    Number of Times Moved in the Last Year: 1    Homeless in the Last Year: No  Utilities: Not At Risk (08/18/2023)   [facility] from [facility] Utilities    Threatened with loss of utilities: No   Health Literacy: Not on file     Last Labs:  Office Visit on 03/22/2024  Component Date Value Ref Range Status   Cholesterol 03/22/2024 125  0 - 200 mg/dL Final   ATP III Classification       Desirable:  < 200 mg/dL               Borderline High:  200 - 239 mg/dL          High:  > = 759 mg/dL   Triglycerides 87/98/7974 72.0  0.0 - 149.0 mg/dL Final   Normal:  <849 mg/dLBorderline High:  150 - 199 mg/dL   HDL 87/98/7974 63.19 (L)  >60.99 mg/dL Final   VLDL 87/98/7974 14.4  0.0 - 40.0 mg/dL Final   LDL Cholesterol 03/22/2024 74  0 - 99 mg/dL Final   Total CHOL/HDL Ratio 03/22/2024 3   Final                  Men          Women1/2 Average Risk     3.4          3.3Average Risk          5.0          4.42X Average Risk          9.6          7.13X Average Risk          15.0          11.0                       NonHDL 03/22/2024 88.23   Final   NOTE:  Non-HDL goal should be 30 mg/dL higher than patient's LDL goal (i.e. LDL goal of < 70 mg/dL, would have non-HDL goal of < 100 mg/dL)   Hgb J8r MFr Bld 87/98/7974 5.8  4.6 - 6.5 % Final   Glycemic Control  Guidelines for People with Diabetes:Non Diabetic:  <6%Goal of Therapy: <7%Additional Action Suggested:  >8%    Sodium 03/22/2024 138  135 - 145 mEq/L Final   Potassium 03/22/2024 4.1  3.5 - 5.1 mEq/L Final   Chloride 03/22/2024 101  96 - 112 mEq/L Final   CO2 03/22/2024 31  19 - 32 mEq/L Final   Elevated LDH levels may cause falsely increased CO2 results. If LDH is >2000 U/L, a positive bias of 12% is possible.   Glucose, Bld 03/22/2024 87  70 - 99 mg/dL Final   BUN 87/98/7974 13  6 - 23 mg/dL Final   Creatinine, Ser 03/22/2024 1.01  0.40 - 1.50 mg/dL Final   Total Bilirubin 03/22/2024 0.4  0.2 - 1.2 mg/dL Final   Alkaline Phosphatase 03/22/2024 59  39 - 117 U/L Final   AST 03/22/2024 28  0 - 37 U/L Final   ALT 03/22/2024 43  0 - 53 U/L Final   Total Protein 03/22/2024 7.8  6.0 - 8.3 g/dL Final   Albumin 87/98/7974 4.1  3.5 - 5.2 g/dL Final    GFR 87/98/7974 101.43  >60.00 mL/min Final   Calculated using the CKD-EPI Creatinine Equation (2021)   Calcium 03/22/2024 9.6  8.4 - 10.5 mg/dL Final   WBC 87/98/7974 4.2  4.0 - 10.5 K/uL Final   RBC 03/22/2024 4.44  4.22 - 5.81 Mil/uL Final   Hemoglobin 03/22/2024 14.1  13.0 - 17.0 g/dL Final   HCT 87/98/7974 41.8  39.0 - 52.0 % Final   MCV 03/22/2024 94.0  78.0 - 100.0 fl Final   MCHC 03/22/2024 33.7  30.0 - 36.0 g/dL Final   RDW 87/98/7974 13.5  11.5 - 15.5 % Final   Platelets 03/22/2024 212.0  150.0 - 400.0 K/uL Final   Neutrophils Relative % 03/22/2024 49.4  43.0 - 77.0 % Final   Lymphocytes Relative 03/22/2024 38.7  12.0 - 46.0 % Final   Monocytes Relative 03/22/2024 9.4  3.0 - 12.0 % Final   Eosinophils Relative 03/22/2024 2.3  0.0 - 5.0 % Final   Basophils Relative 03/22/2024 0.2  0.0 - 3.0 % Final   Neutro Abs 03/22/2024 2.1  1.4 - 7.7 K/uL Final   Lymphs Abs 03/22/2024 1.6  0.7 - 4.0 K/uL Final   Monocytes Absolute 03/22/2024 0.4  0.1 - 1.0 K/uL Final   Eosinophils Absolute 03/22/2024 0.1  0.0 - 0.7 K/uL Final   Basophils Absolute 03/22/2024 0.0  0.0 - 0.1 K/uL Final    Allergies: Patient has no known allergies.  Medications:  Facility Ordered Medications  Medication   acetaminophen  (TYLENOL ) tablet 650 mg   alum & mag hydroxide-simeth (MAALOX/MYLANTA) 200-200-20 MG/5ML suspension 30 mL   magnesium  hydroxide (MILK OF MAGNESIA) suspension 30 mL   haloperidol  (HALDOL ) tablet 5 mg   And   diphenhydrAMINE  (BENADRYL ) capsule 50 mg   haloperidol  lactate (HALDOL ) injection 5 mg   And   diphenhydrAMINE  (BENADRYL ) injection 50 mg   And   LORazepam  (ATIVAN ) injection 2 mg   haloperidol  lactate (HALDOL ) injection 10 mg   And   diphenhydrAMINE  (BENADRYL ) injection 50 mg   And   LORazepam  (ATIVAN ) injection 2 mg   hydrOXYzine  (ATARAX ) tablet 25 mg   traZODone  (DESYREL ) tablet 50 mg   PTA Medications  Medication Sig   Cholecalciferol  (VITAMIN D3) 250 MCG (10000 UT)  capsule Take 10,000 Units by mouth daily.   benztropine  (COGENTIN ) 0.5 MG tablet Take 1 tablet (0.5 mg total) by mouth 2 (two)  times daily for 14 days. (Patient not taking: Reported on 04/06/2024)   buPROPion HCl ER, XL, 450 MG TB24 Take 1 tablet by mouth every morning.   Blood Glucose Monitoring Suppl (ACCU-CHEK GUIDE) w/Device KIT USE GLUCOSE MONITOR DAILY AS NEEDED TO CHECK SUGAR LEVELS   Accu-Chek Softclix Lancets lancets Use as instructed   glucose blood (ACCU-CHEK GUIDE TEST) test strip Use test strips 3 times daily as needed to monitor blood sugar levels   propranolol  (INDERAL ) 10 MG tablet Take 10 mg by mouth 2 (two) times daily.   amLODipine  (NORVASC ) 5 MG tablet TAKE 1 TABLET (5 MG TOTAL) BY MOUTH DAILY.   metFORMIN  (GLUCOPHAGE ) 1000 MG tablet TAKE 1 TABLET BY MOUTH 2 TIMES A DAY WITH A MEAL   COBENFY 100-20 MG CAPS Take by mouth.   tirzepatide  (ZEPBOUND ) 15 MG/0.5ML Pen Inject 15 mg into the skin once a week.   tirzepatide  (ZEPBOUND ) 10 MG/0.5ML Pen Inject 10 mg into the skin once a week.    Medical Decision Making  SUICIDE RISK: High Risk:  Frequent, intense, and enduring suicidal ideation, specific plan prior to hospitalization, no subjective intent, but some objective markers of intent (i.e., choice of lethal method), the method is accessible, some limited preparatory behavior, there is some evidence self-control as verbalized by patient, but he is also presenting with severe dysphoria/symptomatology, and there are multiple risk factors present (prior attempts, mental illness history, current mental status), and few if any protective factors that patient can identify at this time. He states that he is unable to identify one, is not afraid to do it, and will not verbally contract for safety outside of a controlled environment.  Plan: Based on my evaluation the patient appears to have an emergency mental health condition for which I recommend the patient be transferred to an  inpatient behavioral health unit for treatment and stabilization.  I certify that inpatient hospitalization would reasonably improve patient's mental status.   -[facility] contacted for bed availability appropriate for patient - Baseline labs ordered: Please see labs - Ordered home medications:Conbenfy 100-20 mg caps, Norvasc  5 mg daily   Recommendations  Please transfer the patient to an inpatient behavioral health unit when an appropriate bed becomes available for treatment and stabilization of mental status.  Donia Snell, NP 05/14/24  7:00 PM

## 2024-05-14 NOTE — BH Assessment (Signed)
 Comprehensive Clinical Assessment (CCA) Note  05/14/2024 [place] [id number]  Disposition: Per Donia Snell, NP Patient is recommended for inpatient treatment.   The patient demonstrates the following risk factors for suicide: Chronic risk factors for suicide include: psychiatric disorder of schizophrenia. Acute risk factors for suicide include: unemployment and social withdrawal/isolation. Protective factors for this patient include: positive social support and positive therapeutic relationship. Considering these factors, the overall suicide risk at this point appears to be moderate. Patient is appropriate for outpatient follow up.  Per triage note: Pt present to [facility] voluntarily and accompanied with is mother, Noah Ramsey.  Pt deneis SI, HI, or AVH.  Pt reports he is feeling increasingly depressed for three months due to medication changes.  Pt's reports feeling agitated, worrying, and anxious. Pt reports prior sucide attempt three years ago, trying to shoot himself.   Pt's mother reports he is experiencing loss of interests and isolating.  Pt denies using substance use.  Pt admits to MH diagnosis.  Patient is a 29 year old male who presents voluntarily to [facility] accompanied by his mother Noah Ramsey due to experiencing increase in intrusive thoughts of guilt and shame.  Initially patient denied having suicidal thoughts but later in assessment endorsed that he has had recent suicidal thoughts that have amplified over the past few days and weeks.  Patient admits that he has had at least 3 previous suicide attempts.  Patient reports that he he would be okay if he went to sleep and did not wake up in the morning.  While patient does not have any intentions of following through, he does think about taking a knife and slitting his wrist and just bleeding out.  Patient was unable to contract for safety.  Patient denies any HI or paranoia.  Patient endorses hallucinations in his  sleep.  Patient endorses symptoms of depression to include decreasing energy level, decrease in sleep going from 8 to 10 hours a night to 4 to 5 hours per night, feelings of hopelessness, helpless, worthlessness, guilt and poor concentration.  He he also endorses feelings of anxiety to include feeling tense,worrying and and feeling on edge.  Patient denies any alcohol or illicit substance use.  During assessment patient is sitting in chair casually dressed, alert and oriented x 5.  Patient's speech is clear and coherent with normal tone and volume.  Patient's mood is depressed with congruent affect.  Patient's thought process is coherent and relevant.  There is no indication that the patient was responding to internal stimuli or experiencing delusional thought content.  Patient was calm and cooperative throughout assessment  Chief Complaint:  Chief Complaint  Patient presents with   Anxiety   Visit Diagnosis:  MDD (major depressive disorder), recurrent severe, without psychosis (HCC)                                GAD (generalized anxiety disorder)   CCA Screening, Triage and Referral (STR)  Patient Reported Information How did you hear about us ? Family/Friend  What Is the Reason for Your Visit/Call Today? Pt present to [facility] voluntarily and accompanied with is mother, Noah Ramsey.  Pt deneis SI, HI, or AVH.  Pt reports he is feeling increasingly depressed for three months due to medication changes.  Pt's reports feeling agitated, worrying, and anxious. Pt reports prior sucide attempt three years ago, trying to shoot himself.   Pt's mother reports he is experiencing loss of interests and  isolating.  Pt denies using substance use.  Pt admits to MH diagnosis.  How Long Has This Been Causing You Problems? 1-6 months  What Do You Feel Would Help You the Most Today? Treatment for Depression or other mood problem; Stress Management; Medication(s)   Have You Recently Had Any Thoughts About  Hurting Yourself? Yes  Are You Planning to Commit Suicide/Harm Yourself At This time? No   Flowsheet Row ED from 05/14/2024 in [facility] ED from 01/16/2023 in [facility]Dis[facility]  C-SSRS RISK CATEGORY Moderate Risk No Risk No Risk    Have you Recently Had Thoughts About Hurting Someone [place]? No  Are You Planning to Harm Someone at This Time? No  Explanation: Pt denies   Have You Used Any Alcohol or Drugs in the Past 24 Hours? No  How Long Ago Did You Use Drugs or Alcohol? N/A What Did You Use and How Much? N/A  Do You Currently Have a Therapist/Psychiatrist? No (Pt is on an ACT Team but  they are having staffing issues)  Name of Therapist/Psychiatrist: Dr. Sable  Have You Been Recently Discharged From Any Office Practice or Programs? No  Explanation of Discharge From Practice/Program: N//A    CCA Screening Triage Referral Assessment Type of Contact: Face-to-Face  Telemedicine Service Delivery:   Is this Initial or Reassessment?   Date Telepsych consult ordered in [facility]:    Time Telepsych consult ordered in [facility]:    Location of Assessment: [facility] Assessment Services  Provider Location: [facility] Assessment Services   Collateral Involvement: Pt mother:Noah Ramsey   Does Patient Have a Automotive Engineer Guardian? No  Legal Guardian Contact Information: N/A  Copy of Legal Guardianship Form: -- (N/A)  Legal Guardian Notified of Arrival: -- (N/A)  Legal Guardian Notified of Pending Discharge: -- (N/A)  If Minor and Not Living with Parent(s), Who has Custody? N/A  Is CPS involved or ever been involved? Never  Is APS involved or ever been involved? Never   Patient Determined To Be At Risk for Harm To Self or Others Based on Review of Patient Reported Information or Presenting Complaint? Yes, for Self-Harm  Method: Plan  without intent  Availability of Means: Has close by  Intent: Vague intent or NA  Notification Required: No need or identified person  Additional Information for Danger to Others Potential: -- (N/A)  Additional Comments for Danger to Others Potential: No hx of HI  Are There Guns or Other Weapons in Your Home? No  Types of Guns/Weapons: n/A  Are These Weapons Safely Secured?                            -- (N/A)  Who Could Verify You Are Able To Have These Secured: N/A  Do You Have any Outstanding Charges, Pending Court Dates, Parole/Probation? Pt denies  Contacted To Inform of Risk of Harm To Self or Others: Family/Significant Other:    Does Patient Present under Involuntary Commitment? No    [place] of Residence: [place]   Patient Currently Receiving the Following Services: ACTT Psychologist, Educational)   Determination of Need: Routine (7 days)   Options For Referral: Inpatient Hospitalization; Medication Management     CCA Biopsychosocial Patient Reported Schizophrenia/Schizoaffective Diagnosis in Past: Yes   Strengths: Pt recognizes he need to engage in services. Strong family support   Mental  Health Symptoms Depression:  Change in energy/activity; Difficulty Concentrating; Hopelessness; Increase/decrease in appetite; Sleep (too much or little); Worthlessness   Duration of Depressive symptoms: Duration of Depressive Symptoms: Greater than two weeks   Mania:  None   Anxiety:   Tension; Worrying; Difficulty concentrating; Sleep; Restlessness   Psychosis:  Hallucinations   Duration of Psychotic symptoms: Duration of Psychotic Symptoms: Greater than six months   Trauma:  None   Obsessions:  None   Compulsions:  None   Inattention:  N/A   Hyperactivity/Impulsivity:  N/A   Oppositional/Defiant Behaviors:  N/A   Emotional Irregularity:  Chronic feelings of emptiness; Recurrent suicidal behaviors/gestures/threats; Mood lability   Other  Mood/Personality Symptoms:  N/A    Mental Status Exam Appearance and self-care  Stature:  Average   Weight:  Overweight   Clothing:  Casual   Grooming:  Normal   Cosmetic use:  None   Posture/gait:  Normal   Motor activity:  Not Remarkable   Sensorium  Attention:  Inattentive   Concentration:  Normal   Orientation:  X5   Recall/memory:  Normal   Affect and Mood  Affect:  Depressed   Mood:  Depressed   Relating  Eye contact:  Normal   Facial expression:  Depressed   Attitude toward examiner:  Cooperative   Thought and Language  Speech flow: Blocked   Thought content:  Appropriate to Mood and Circumstances   Preoccupation:  None   Hallucinations:  Auditory   Organization:  Disorganized   Company Secretary of Knowledge:  Fair   Intelligence:  Average   Abstraction:  Normal   Judgement:  Fair   Dance Movement Psychotherapist:  Adequate   Insight:  Fair   Decision Making:  Impulsive   Social Functioning  Social Maturity:  Isolates   Social Judgement:  Naive   Stress  Stressors:  Other (Comment) (concerns with his mental health)   Coping Ability:  Overwhelmed   Skill Deficits:  Decision making; Self-control   Supports:  Family     Religion: Religion/Spirituality Are You A Religious Person?: Yes What is Your Religious Affiliation?: None How Might This Affect Treatment?: N/A  Leisure/Recreation: Leisure / Recreation Do You Have Hobbies?: Yes Leisure and Hobbies: Watching TV, playing games on computer  Exercise/Diet: Exercise/Diet Do You Exercise?: No Have You Gained or Lost A Significant Amount of Weight in the Past Six Months?: No Do You Follow a Special Diet?: No Do You Have Any Trouble Sleeping?: Yes Explanation of Sleeping Difficulties: Pt reports sleep has decreased from 8-10 hours per night to 4-5   CCA Employment/Education Employment/Work Situation: Employment / Work Situation Employment Situation: On disability Why is  Patient on Disability: Due to dx ofSchizophrenia. How Long has Patient Been on Disability: Since 2022. Patient's Job has Been Impacted by Current Illness: No Has Patient ever Been in the 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) Did You Have An Individualized Education Program (IIEP): No Did You Have Any Difficulty At School?: No Patient's Education Has Been Impacted by Current Illness: No   CCA Family/Childhood History Family and Relationship History: Family history Does patient have children?: No  Childhood History:  Childhood History By whom was/is the patient raised?: Both parents Did patient suffer any verbal/emotional/physical/sexual abuse as a child?: No Did patient suffer from severe childhood neglect?: No Has patient ever been sexually abused/assaulted/raped as an adolescent or  adult?: No Was the patient ever a victim of a crime or a disaster?: No Witnessed domestic violence?: No Has patient been affected by domestic violence as an adult?: No       CCA Substance Use Alcohol/Drug Use: Alcohol / Drug Use Pain Medications: See MAR Prescriptions: See MAR Over the Counter: See MAR History of alcohol / drug use?: No history of alcohol / drug abuse Longest period of sobriety (when/how long): N/A Negative Consequences of Use:  (N/A) Withdrawal Symptoms:  (N/A)                         ASAM's:  Six Dimensions of Multidimensional Assessment  Dimension 1:  Acute Intoxication and/or Withdrawal Potential:   Dimension 1:  Description of individual's past and current experiences of substance use and withdrawal: N/A  Dimension 2:  Biomedical Conditions and Complications:   Dimension 2:  Description of patient's biomedical conditions and  complications: N/A  Dimension 3:  Emotional, Behavioral, or Cognitive Conditions and Complications:   Dimension 3:  Description of emotional, behavioral, or cognitive conditions and complications: N/A  Dimension 4:  Readiness to Change:  Dimension 4:  Description of Readiness to Change criteria: N/A  Dimension 5:  Relapse, Continued use, or Continued Problem Potential:  Dimension 5:  Relapse, continued use, or continued problem potential critiera description: N/A  Dimension 6:  Recovery/Living Environment:  Dimension 6:  Recovery/Iiving environment criteria description: N/A  ASAM Severity Score:    ASAM Recommended Level of Treatment: ASAM Recommended Level of Treatment:  (N/A)   Substance use Disorder (SUD) Substance Use Disorder (SUD)  Checklist Symptoms of Substance Use:  (N/A)  Recommendations for Services/Supports/Treatments: Recommendations for Services/Supports/Treatments Recommendations For Services/Supports/Treatments:  (N/A)  Disposition Recommendation per psychiatric provider: We recommend inpatient psychiatric hospitalization when medically cleared. Patient is under voluntary admission at this time.   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

## 2024-05-14 NOTE — ED Notes (Signed)
 Pt A&O x 4, no distress noted, resting at present, presents with passive suicidal ideation noted.  Previous attempts noted.  Comfort measures given.  Monitoring for safety.

## 2024-05-14 NOTE — ED Notes (Signed)
 Pt signed electronic consent.

## 2024-05-15 ENCOUNTER — Inpatient Hospital Stay (HOSPITAL_COMMUNITY): Admission: AD | Admit: 2024-05-15 | Discharge: 2024-05-18 | DRG: 885 | Disposition: A | Source: Intra-hospital

## 2024-05-15 ENCOUNTER — Other Ambulatory Visit: Payer: Self-pay

## 2024-05-15 ENCOUNTER — Other Ambulatory Visit: Payer: Self-pay | Admitting: Emergency Medicine

## 2024-05-15 ENCOUNTER — Encounter (HOSPITAL_COMMUNITY): Payer: Self-pay | Admitting: Student in an Organized Health Care Education/Training Program

## 2024-05-15 DIAGNOSIS — Z818 Family history of other mental and behavioral disorders: Secondary | ICD-10-CM | POA: Diagnosis not present

## 2024-05-15 DIAGNOSIS — Z7985 Long-term (current) use of injectable non-insulin antidiabetic drugs: Secondary | ICD-10-CM | POA: Diagnosis not present

## 2024-05-15 DIAGNOSIS — F79 Unspecified intellectual disabilities: Secondary | ICD-10-CM | POA: Diagnosis present

## 2024-05-15 DIAGNOSIS — R45851 Suicidal ideations: Secondary | ICD-10-CM | POA: Diagnosis present

## 2024-05-15 DIAGNOSIS — Z87891 Personal history of nicotine dependence: Secondary | ICD-10-CM

## 2024-05-15 DIAGNOSIS — F332 Major depressive disorder, recurrent severe without psychotic features: Principal | ICD-10-CM | POA: Diagnosis present

## 2024-05-15 DIAGNOSIS — Z7984 Long term (current) use of oral hypoglycemic drugs: Secondary | ICD-10-CM

## 2024-05-15 DIAGNOSIS — Z79899 Other long term (current) drug therapy: Secondary | ICD-10-CM

## 2024-05-15 DIAGNOSIS — F333 Major depressive disorder, recurrent, severe with psychotic symptoms: Principal | ICD-10-CM | POA: Diagnosis present

## 2024-05-15 DIAGNOSIS — F411 Generalized anxiety disorder: Secondary | ICD-10-CM | POA: Diagnosis present

## 2024-05-15 DIAGNOSIS — Z9151 Personal history of suicidal behavior: Secondary | ICD-10-CM

## 2024-05-15 DIAGNOSIS — I10 Essential (primary) hypertension: Secondary | ICD-10-CM | POA: Diagnosis present

## 2024-05-15 LAB — HEMOGLOBIN A1C
Hgb A1c MFr Bld: 5.8 % — ABNORMAL HIGH (ref 4.8–5.6)
Mean Plasma Glucose: 119.76 mg/dL

## 2024-05-15 MED ORDER — LORAZEPAM 2 MG/ML IJ SOLN: 2.0000 mg | Freq: Three times a day (TID) | INTRAMUSCULAR | Status: DC | PRN

## 2024-05-15 MED ORDER — CHOLECALCIFEROL 125 MCG (5000 UT) PO TABS
5000.0000 [IU] | ORAL_TABLET | Freq: Every day | ORAL | Status: DC
  Administered 2024-05-15 – 2024-05-18 (×4): 5000 [IU] via ORAL
  Filled 2024-05-15 (×5): qty 1

## 2024-05-15 MED ORDER — XANOMELINE-TROSPIUM CHLORIDE 100-20 MG PO CAPS
100.0000 mg | ORAL_CAPSULE | Freq: Every day | ORAL | Status: DC
  Administered 2024-05-15 – 2024-05-18 (×4): 100 mg via ORAL
  Filled 2024-05-15 (×5): qty 1

## 2024-05-15 MED ORDER — DIPHENHYDRAMINE HCL 25 MG PO CAPS: 50.0000 mg | ORAL_CAPSULE | Freq: Three times a day (TID) | ORAL | Status: DC | PRN

## 2024-05-15 MED ORDER — METFORMIN HCL 500 MG PO TABS
1000.0000 mg | ORAL_TABLET | Freq: Two times a day (BID) | ORAL | Status: DC
  Administered 2024-05-15 – 2024-05-18 (×6): 1000 mg via ORAL
  Filled 2024-05-15 (×6): qty 2

## 2024-05-15 MED ORDER — TRAZODONE HCL 50 MG PO TABS
50.0000 mg | ORAL_TABLET | Freq: Every evening | ORAL | Status: DC | PRN
  Administered 2024-05-15 – 2024-05-16 (×2): 50 mg via ORAL
  Filled 2024-05-15 (×3): qty 1

## 2024-05-15 MED ORDER — HYDROXYZINE HCL 25 MG PO TABS
25.0000 mg | ORAL_TABLET | Freq: Three times a day (TID) | ORAL | Status: DC | PRN
  Administered 2024-05-15: 25 mg via ORAL
  Filled 2024-05-15: qty 1

## 2024-05-15 MED ORDER — HALOPERIDOL LACTATE 5 MG/ML IJ SOLN: 5.0000 mg | Freq: Three times a day (TID) | INTRAMUSCULAR | Status: DC | PRN

## 2024-05-15 MED ORDER — VITAMIN D 25 MCG (1000 UNIT) PO TABS
10000.0000 [IU] | ORAL_TABLET | Freq: Every day | ORAL | Status: DC
  Filled 2024-05-15: qty 10

## 2024-05-15 MED ORDER — ACETAMINOPHEN 325 MG PO TABS
650.0000 mg | ORAL_TABLET | Freq: Four times a day (QID) | ORAL | Status: DC | PRN
  Administered 2024-05-15 – 2024-05-17 (×2): 650 mg via ORAL
  Filled 2024-05-15 (×2): qty 2

## 2024-05-15 MED ORDER — HALOPERIDOL LACTATE 5 MG/ML IJ SOLN: 10.0000 mg | Freq: Three times a day (TID) | INTRAMUSCULAR | Status: DC | PRN

## 2024-05-15 MED ORDER — MAGNESIUM HYDROXIDE 400 MG/5ML PO SUSP: 30.0000 mL | Freq: Every day | ORAL | Status: DC | PRN

## 2024-05-15 MED ORDER — PROPRANOLOL HCL 10 MG PO TABS
10.0000 mg | ORAL_TABLET | Freq: Two times a day (BID) | ORAL | Status: DC
  Administered 2024-05-15 – 2024-05-18 (×7): 10 mg via ORAL
  Filled 2024-05-15 (×7): qty 1

## 2024-05-15 MED ORDER — AMLODIPINE BESYLATE 5 MG PO TABS
5.0000 mg | ORAL_TABLET | Freq: Every day | ORAL | Status: DC
  Administered 2024-05-15 – 2024-05-18 (×4): 5 mg via ORAL
  Filled 2024-05-15 (×4): qty 1

## 2024-05-15 MED ORDER — HALOPERIDOL 5 MG PO TABS: 5.0000 mg | ORAL_TABLET | Freq: Three times a day (TID) | ORAL | Status: DC | PRN

## 2024-05-15 MED ORDER — HYDROXYZINE HCL 50 MG PO TABS
50.0000 mg | ORAL_TABLET | Freq: Three times a day (TID) | ORAL | Status: DC | PRN
  Administered 2024-05-15 – 2024-05-17 (×6): 50 mg via ORAL
  Filled 2024-05-15 (×6): qty 1

## 2024-05-15 MED ORDER — ALUM & MAG HYDROXIDE-SIMETH 200-200-20 MG/5ML PO SUSP: 30.0000 mL | ORAL | Status: DC | PRN

## 2024-05-15 MED ORDER — DIPHENHYDRAMINE HCL 50 MG/ML IJ SOLN: 50.0000 mg | Freq: Three times a day (TID) | INTRAMUSCULAR | Status: DC | PRN

## 2024-05-15 MED ORDER — BUPROPION HCL ER (XL) 150 MG PO TB24
450.0000 mg | ORAL_TABLET | Freq: Every morning | ORAL | Status: DC
  Administered 2024-05-15 – 2024-05-17 (×3): 450 mg via ORAL
  Filled 2024-05-15 (×2): qty 3

## 2024-05-15 NOTE — Tx Team (Signed)
 Initial Treatment Plan 05/15/2024 2:19 AM Dozier [place] FMW:[id number]    PATIENT STRESSORS: Medication change or noncompliance     PATIENT STRENGTHS: Communication skills  Motivation for treatment/growth  Physical Health  Supportive family/friends    PATIENT IDENTIFIED PROBLEMS: Depression  Suicidal Ideation  Anxiety      Learn how to deal with suicidal feelings, get on medication appropriate for his anxiety and worrying           DISCHARGE CRITERIA:  Improved stabilization in mood, thinking, and/or behavior Motivation to continue treatment in a less acute level of care Need for constant or close observation no longer present Verbal commitment to aftercare and medication compliance  PRELIMINARY DISCHARGE PLAN: Outpatient therapy Return to previous living arrangement  PATIENT/FAMILY INVOLVEMENT: This treatment plan has been presented to and reviewed with the patient, Noah Ramsey.  The patient and family have been given the opportunity to ask questions and make suggestions.  Effie Naomie Norris, RN 05/15/2024, 2:19 AM

## 2024-05-15 NOTE — Plan of Care (Signed)
   Problem: Education: Goal: Knowledge of [place] [facility] Education information/materials will improve Outcome: Progressing Goal: Emotional status will improve Outcome: Progressing Goal: Mental status will improve Outcome: Progressing Goal: Verbalization of understanding the information provided will improve Outcome: Progressing

## 2024-05-15 NOTE — Group Note (Signed)
 Date:  05/15/2024 Time:  9:04 PM  Group Topic/Focus:  Wrap-Up Group:   The focus of this group is to help patients review their daily goal of treatment and discuss progress on daily workbooks.    Participation Level:  Active  Participation Quality:  Appropriate  Affect:  Appropriate  Cognitive:  Appropriate  Insight: Appropriate  Engagement in Group:  Engaged  Modes of Intervention:  Discussion  Additional Comments:   Pt states that he had a 10/10 day and is wanting to talk about his d/c plan with his care providers tomorrow  Jari Dipasquale A Luqman Perrelli 05/15/2024, 9:04 PM

## 2024-05-15 NOTE — ED Notes (Signed)
 Report called to RN Vertell Sink, [facility].  Librarian, Academic.

## 2024-05-15 NOTE — Group Note (Signed)
 Date:  05/15/2024 Time:  10:26 AM  Group Topic/Focus: Social Wellness  Patients participated in a charades activity designed to promote creativity, critical thinking, and peer interaction. The activity encouraged communication and engagement among group members. During the final 10 minutes of group, patients socialized with one another, further supporting socialization and interpersonal skills.    Participation Level:  Active  Participation Quality:  Attentive and Intrusive  Affect:  Appropriate  Cognitive:  Alert and Appropriate  Insight: Good and Improving  Engagement in Group:  Engaged and Improving  Modes of Intervention:  Activity, Exploration, Role-play, and Socialization  Additional Comments:  Pt attended and actively participated in group  [name] Plaza 05/15/2024, 10:26 AM

## 2024-05-15 NOTE — Progress Notes (Signed)
 Patient stated the last time he slept was 8 hours on Thursday.  Did not sleep last night.  Has hard time sleeping.

## 2024-05-15 NOTE — H&P (Signed)
 Psychiatric Admission Assessment Adult  Patient Identification:  Noah Ramsey MRN:  [id number] Date of Evaluation:  05/15/2024 Chief Complaint:  MDD (major depressive disorder), recurrent episode, severe (HCC) [F33.2] Principal Diagnosis:  MDD (major depressive disorder), recurrent episode, severe (HCC) Diagnosis:  Principal Problem:   MDD (major depressive disorder), recurrent episode, severe (HCC)    SUBJECTIVE:  CC:   I am having issues with it myself and nothing helps  HPI: Noah Ramsey is a 29 y.o. 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 Patient initially arrived to [facility] on 05/14/2024 for suicidal ideations, and admitted to [facility] voluntary on 05/15/2024 for acute safety concerns, acute suicidal or self-harming behaviors, and stabilization of acute on chronic psychiatric conditions. PMHx is significant for prediabetes, hypertension.   Initial assessment on 05/15/2024 , the patient reports worsening mood symptoms with increasing suicidal ideations over the past few weeks.  Reports history of intrusive thoughts and rumination about all the ways in which I have failed my family.  He reports feelings of worthlessness, self recommendation over diagnosed intellectual disability,difficulties with concentration, anhedonia, anxiety.  He reports no changes to appetite or psychomotor activity.  He denies problems with energy level, sleep disturbance.  Collateral information via Grady [place], Mother, Emergency Contact, [phone] - Attempted to call twice at  1515 on 1/24  Past Psychiatric History: Current psychiatrist: [facility] under Dr. Akintayo. Current therapist: none. Previous psychiatric diagnoses: Schizophrenia?  Depression, anxiety. Psychiatric hospitalization(s): Yes multiple, twice in this facility previously.  First at age 34 in [place] . Psychotherapy history: In the past none  current. Neuromodulation history: denied. History of suicide (obtained from HPI): Multiple suicide attempts via overdose,. History of homicide or aggression (obtained in HPI): denied.  Substance Abuse History: Alcohol: denied. Tobacco: denied. Cannabis: denied. IV drug use: denied. Other illicit drugs: denied. Rehab history: denied.  Patient denied current or prior tobacco use, significant alcohol use, and substance abuse  Past Medical History: Remarkable for prediabetes, hypertension, obesity Medical diagnoses: As above. Medications: Metformin  2000 mg twice daily, amlodipine  5 mg once a day, Zepbound  once a week. Supplements: Vitamin D3, Krill oil, multivitamin, magnesium  supplement PCP: Sagardia, Miguel Jose, MD  Hospitalizations: Not for medical thanks. Surgeries: Rotator cuff surgery, muscle biopsy following rhabdomyolysis in 2012 Trauma: denied. Seizures: denied.  Social History: Living situation: Lives with mother, stepfather, older brother reports positive relationships with all. Education: Graduated high school with an IEP. Occupational history: Patient has not worked consistently since March 2020 gets SSDI income. Marital status: Never married Children: none. Legal: none. Military: none.  Access to firearms: none.  Family Psychiatric History: Psychiatric diagnoses: denied. Suicide history: Reports that his brother attempted suicide.  Violence/aggression: denied. Substance use history: Reports that his brother has various substance use disorders.  Family Medical History: Family History  Problem Relation Age of Onset   Mental illness Brother      Total Time spent with patient: 45 minutes  Is the patient at risk to self? Yes.    Has the patient been a risk to self in the past 6 months? Yes.    Has the patient been a risk to self within the distant past? Yes.     Is the patient a risk to others? No.  Has the patient been a risk to others in the past 6  months? No.  Has the patient been a risk to others within the distant past? No.   Columbia Scale:  Flowsheet  Row [facility] (Current) [facility] from 05[facility]14/2024 in [facility] ED from [facility]/16/2023 in [facility]  C-SSRS RISK CATEGORY Moderate Risk Moderate Risk No Risk     Tobacco Screening:  Tobacco Use History[1]  BH Tobacco Counseling     Are you interested in Tobacco Cessation Medications?  No, patient refused Counseled patient on smoking cessation:  Refused/Declined practical counseling Reason Tobacco Screening Not Completed: Patient Refused Screening    Allergies:   Allergies[2]  OBJECTIVE:  Physical Examination:  Physical Exam Vitals and nursing note reviewed.  Constitutional:      Appearance: He is obese.  HENT:     Head: Normocephalic and atraumatic.     Nose: Nose normal.  Musculoskeletal:        General: Normal range of motion.  Skin:    General: Skin is warm and dry.  Neurological:     Mental Status: He is alert.  Psychiatric:        Attention and Perception: Attention and perception normal.        Mood and Affect: Affect normal. Mood is anxious and depressed.        Speech: Speech normal.        Behavior: Behavior normal. Behavior is cooperative.        Thought Content: Thought content is not paranoid or delusional. Thought content includes suicidal ideation. Thought content does not include homicidal ideation.        Cognition and Memory: Cognition and memory normal.        Judgment: Judgment normal.    Review of Systems  Constitutional:  Negative for chills, fever and weight loss.  Respiratory:  Negative for cough.   Cardiovascular:  Negative for chest pain.  Gastrointestinal:  Negative for abdominal pain, constipation, diarrhea, heartburn, nausea and vomiting.  Genitourinary:  Negative for dysuria.  Neurological:  Negative for headaches.   Psychiatric/Behavioral:  Positive for depression and suicidal ideas. Negative for hallucinations and substance abuse. The patient is nervous/anxious. The patient does not have insomnia.    Blood pressure 125/80, pulse (!) 106, temperature 99.1 F (37.3 C), temperature source Oral, resp. rate 12, height 5' 7.5 (1.715 m), weight (!) 161 kg, SpO2 97%. Body mass index is 54.78 kg/m.  Lab Results:  - Metabolic profile and EKG monitoring obtained while on an atypical antipsychotic  BMI: 54.78 TSH: 2.08 (within normal limits) Lipid panel: Low HDL, elevated triglycerides HbgA1c: 5.8 (borderline prediabetes) QTc: 476 ms, 05/14/2024  Metabolic disorder labs:  Lab Results  Component Value Date   HGBA1C 5.8 (H) 05/14/2024   MPG 119.76 05/14/2024   MPG 128.37 12/17/2022   No results found for: PROLACTIN Lab Results  Component Value Date   CHOL 140 05/14/2024   TRIG 182 (H) 05/14/2024   HDL 40 (L) 05/14/2024   CHOLHDL 3.5 05/14/2024   VLDL 36 05/14/2024   LDLCALC 63 05/14/2024   LDLCALC 74 03/22/2024    Results for orders placed or performed during the hospital encounter of 05/14/24 (from the past 48 hours)  CBC with Differential/Platelet     Status: None   Collection Time: 05/14/24  7:46 PM  Result Value Ref Range   WBC 4.9 4.0 - 10.5 K/uL   RBC 4.39 4.22 - 5.81 MIL/uL   Hemoglobin 13.9 13.0 - 17.0 g/dL   HCT 59.6 60.9 - 47.9 %   MCV 91.8 80.0 - 100.0 fL   MCH 31.7 26.0 -  34.0 pg   MCHC 34.5 30.0 - 36.0 g/dL   RDW 86.8 88.4 - 84.4 %   Platelets 219 150 - 400 K/uL   nRBC 0.0 0.0 - 0.2 %   Neutrophils Relative % 49 %   Neutro Abs 2.4 1.7 - 7.7 K/uL   Lymphocytes Relative 43 %   Lymphs Abs 2.1 0.7 - 4.0 K/uL   Monocytes Relative 6 %   Monocytes Absolute 0.3 0.1 - 1.0 K/uL   Eosinophils Relative 2 %   Eosinophils Absolute 0.1 0.0 - 0.5 K/uL   Basophils Relative 0 %   Basophils Absolute 0.0 0.0 - 0.1 K/uL   Immature Granulocytes 0 %   Abs Immature Granulocytes 0.01 0.00 -  0.07 K/uL    Comment: Performed at [facility], [address] [place], [place] [address]  Comprehensive metabolic panel     Status: Abnormal   Collection Time: 05/14/24  7:46 PM  Result Value Ref Range   Sodium 137 135 - 145 mmol/L   Potassium 3.6 3.5 - 5.1 mmol/L   Chloride 99 98 - 111 mmol/L   CO2 25 22 - 32 mmol/L   Glucose, Bld 111 (H) 70 - 99 mg/dL    Comment: Glucose reference range applies only to samples taken after fasting for at least 8 hours.   BUN 14 6 - 20 mg/dL   Creatinine, Ser 8.95 0.61 - 1.24 mg/dL   Calcium 9.3 8.9 - 89.6 mg/dL   Total Protein 7.9 6.5 - 8.1 g/dL   Albumin 4.1 3.5 - 5.0 g/dL   AST 42 (H) 15 - 41 U/L   ALT 50 (H) 0 - 44 U/L   Alkaline Phosphatase 72 38 - 126 U/L   Total Bilirubin 0.3 0.0 - 1.2 mg/dL   GFR, Estimated >39 >39 mL/min    Comment: (NOTE) Calculated using the CKD-EPI Creatinine Equation (2021)    Anion gap 13 5 - 15    Comment: Performed at [facility], [address], [place], [place] [address]  Hemoglobin A1c     Status: Abnormal   Collection Time: 05/14/24  7:46 PM  Result Value Ref Range   Hgb A1c MFr Bld 5.8 (H) 4.8 - 5.6 %    Comment: (NOTE) Diagnosis of Diabetes The following HbA1c ranges recommended by the American Diabetes Association (ADA) may be used as an aid in the diagnosis of diabetes mellitus.  Hemoglobin             Suggested A1C NGSP%              Diagnosis  <5.7                   Non Diabetic  5.7-6.4                Pre-Diabetic  >6.4                   Diabetic  <7.0                   Glycemic control for                       adults with diabetes.     Mean Plasma Glucose 119.76 mg/dL    Comment: Performed at [facility], [address] [place], [place] [address]  Magnesium      Status: None   Collection Time: 05/14/24  7:46 PM  Result  Value Ref Range   Magnesium  1.7 1.7 - 2.4 mg/dL    Comment: Performed at [facility], [address], [place], [place] [address]   Ethanol     Status: None   Collection Time: 05/14/24  7:46 PM  Result Value Ref Range   Alcohol, Ethyl (B) <15 <15 mg/dL    Comment: (NOTE) For medical purposes only. Performed at [facility], [address] [place], [place] [address]   Lipid panel     Status: Abnormal   Collection Time: 05/14/24  7:46 PM  Result Value Ref Range   Cholesterol 140 0 - 200 mg/dL    Comment:        ATP III CLASSIFICATION:  <200     mg/dL   Desirable  799-760  mg/dL   Borderline High  >=759    mg/dL   High           Triglycerides 182 (H) <150 mg/dL   HDL 40 (L) >59 mg/dL   Total CHOL/HDL Ratio 3.5 RATIO   VLDL 36 0 - 40 mg/dL   LDL Cholesterol 63 0 - 99 mg/dL    Comment:        Total Cholesterol/HDL:CHD Risk Coronary Heart Disease Risk Table                     Men   Women  1/2 Average Risk   3.4   3.3  Average Risk       5.0   4.4  2 X Average Risk   9.6   7.1  3 X Average Risk  23.4   11.0        Use the calculated Patient Ratio above and the CHD Risk Table to determine the patient's CHD Risk.        ATP III CLASSIFICATION (LDL):  <100     mg/dL   Optimal  899-870  mg/dL   Near or Above                    Optimal  130-159  mg/dL   Borderline  839-810  mg/dL   High  >809     mg/dL   Very High Performed at [facility], [address], [place], [place] [address]   TSH     Status: None   Collection Time: 05/14/24  7:46 PM  Result Value Ref Range   TSH 2.080 0.350 - 4.500 uIU/mL    Comment: Performed at [facility], [address], [place], [place] [address]  Urinalysis, Routine w reflex microscopic -Urine, Clean Catch     Status: Abnormal   Collection Time: 05/14/24  7:47 PM  Result Value Ref Range   Color, Urine YELLOW YELLOW   APPearance HAZY (A) CLEAR   Specific Gravity, Urine 1.020 1.005 - 1.030   pH 6.0 5.0 - 8.0   Glucose, UA NEGATIVE NEGATIVE mg/dL   Hgb urine dipstick NEGATIVE NEGATIVE   Bilirubin Urine NEGATIVE NEGATIVE   Ketones, ur NEGATIVE  NEGATIVE mg/dL   Protein, ur NEGATIVE NEGATIVE mg/dL   Nitrite NEGATIVE NEGATIVE   Leukocytes,Ua NEGATIVE NEGATIVE    Comment: Performed at [facility], [address] [place], [place] [address]  POCT Urine Drug Screen - (I-Screen)     Status: Normal   Collection Time: 05/14/24  7:49 PM  Result Value Ref Range   POC Amphetamine UR None Detected NONE DETECTED (Cut Off Level  1000 ng/mL)   POC Secobarbital (BAR) None Detected NONE DETECTED (Cut Off Level 300 ng/mL)   POC Buprenorphine (BUP) None Detected NONE DETECTED (Cut Off Level 10 ng/mL)   POC Oxazepam (BZO) None Detected NONE DETECTED (Cut Off Level 300 ng/mL)   POC Cocaine UR None Detected NONE DETECTED (Cut Off Level 300 ng/mL)   POC Methamphetamine UR None Detected NONE DETECTED (Cut Off Level 1000 ng/mL)   POC Morphine None Detected NONE DETECTED (Cut Off Level 300 ng/mL)   POC Methadone UR None Detected NONE DETECTED (Cut Off Level 300 ng/mL)   POC Oxycodone  UR None Detected NONE DETECTED (Cut Off Level 100 ng/mL)   POC Marijuana UR None Detected NONE DETECTED (Cut Off Level 50 ng/mL)    Blood alcohol level:  Lab Results  Component Value Date   [facility] <15 05/14/2024   ETH <10 12/17/2022    Current Medications: Current Facility-Administered Medications  Medication Dose Route Frequency Provider Last Rate Last Admin   acetaminophen  (TYLENOL ) tablet 650 mg  650 mg Oral Q6H PRN Bobbitt, Shalon E, NP   650 mg at 05/15/24 0355   alum & mag hydroxide-simeth (MAALOX/MYLANTA) 200-200-20 MG/5ML suspension 30 mL  30 mL Oral Q4H PRN Bobbitt, Shalon E, NP       amLODipine  (NORVASC ) tablet 5 mg  5 mg Oral Daily Bobbitt, Shalon E, NP   5 mg at 05/15/24 9192   buPROPion  (WELLBUTRIN  XL) 24 hr tablet 450 mg  450 mg Oral q morning Bobbitt, Shalon E, NP   450 mg at 05/15/24 9074   Cholecalciferol  TABS 5,000 Units  5,000 Units Oral Daily Prentis Kitchens A, DO   5,000 Units at 05/15/24 9072   haloperidol  (HALDOL ) tablet 5 mg  5 mg Oral TID  PRN Bobbitt, Shalon E, NP       And   diphenhydrAMINE  (BENADRYL ) capsule 50 mg  50 mg Oral TID PRN Bobbitt, Shalon E, NP       haloperidol  lactate (HALDOL ) injection 5 mg  5 mg Intramuscular TID PRN Bobbitt, Shalon E, NP       And   diphenhydrAMINE  (BENADRYL ) injection 50 mg  50 mg Intramuscular TID PRN Bobbitt, Shalon E, NP       And   LORazepam  (ATIVAN ) injection 2 mg  2 mg Intramuscular TID PRN Bobbitt, Shalon E, NP       haloperidol  lactate (HALDOL ) injection 10 mg  10 mg Intramuscular TID PRN Bobbitt, Shalon E, NP       And   diphenhydrAMINE  (BENADRYL ) injection 50 mg  50 mg Intramuscular TID PRN Bobbitt, Shalon E, NP       And   LORazepam  (ATIVAN ) injection 2 mg  2 mg Intramuscular TID PRN Bobbitt, Shalon E, NP       hydrOXYzine  (ATARAX ) tablet 25 mg  25 mg Oral TID PRN Bobbitt, Shalon E, NP   25 mg at 05/15/24 0355   magnesium  hydroxide (MILK OF MAGNESIA) suspension 30 mL  30 mL Oral Daily PRN Bobbitt, Shalon E, NP       propranolol  (INDERAL ) tablet 10 mg  10 mg Oral BID Bobbitt, Shalon E, NP   10 mg at 05/15/24 0807   traZODone  (DESYREL ) tablet 50 mg  50 mg Oral QHS PRN Bobbitt, Shalon E, NP        PTA Medications: Medications Prior to Admission  Medication Sig Dispense Refill Last Dose/Taking   benztropine  (COGENTIN ) 0.5 MG tablet Take 1 tablet (0.5 mg total) by mouth 2 (  two) times daily for 14 days. 28 tablet 0 Past Week   buPROPion  HCl ER, XL, 450 MG TB24 Take 450 mg by mouth daily.   Taking   calcium carbonate (OS-CAL - DOSED IN MG OF ELEMENTAL CALCIUM) 1250 (500 Ca) MG tablet Take 1 tablet by mouth daily with breakfast.   Taking   Cholecalciferol  125 MCG (5000 UT) TABS Take 5,000 Units by mouth daily.   Taking   magnesium  oxide (MAG-OX) 400 (240 Mg) MG tablet Take 400 mg by mouth daily.   Taking   amLODipine  (NORVASC ) 5 MG tablet TAKE 1 TABLET (5 MG TOTAL) BY MOUTH DAILY. 30 tablet 4    COBENFY  100-20 MG CAPS Take by mouth.      metFORMIN  (GLUCOPHAGE ) 1000 MG tablet TAKE 1  TABLET BY MOUTH 2 TIMES A DAY WITH A MEAL 180 tablet 1    propranolol  (INDERAL ) 10 MG tablet Take 10 mg by mouth 2 (two) times daily.      tirzepatide  (ZEPBOUND ) 10 MG/0.5ML Pen Inject 10 mg into the skin once a week. 2 mL 3     Psychiatric Specialty Exam:   Mental Status Exam:  Appearance: Obese, casually groomed African-American male.  Behavior: Good eye contact minimal fidgeting  Attitude: Cooperative, agreeable  Speech: Regular rate and rhythm.  Diction indicative of education  Mood:  I feel really down and anxious  Affect: Somewhat incongruent.  Patient's affect is calm, euthymic  Thought Process: Linear, coherent  Thought Content: Ruminative behavior about his own intellectual deficits  SI/HI: Endorses chronic passive SI.  Denies active si today.  Denies any history of HI  Perceptions: denies, not seen responding  Judgement: Fair  Insight: Fair  Fund of Knowledge: WNL   ASSESSMENT AND PLAN: Noah Ramsey is a 29 y.o. 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 Patient initially arrived to [facility] on 05/14/2024 for suicidal ideations, and admitted to [facility] voluntary on 05/15/2024 for acute safety concerns, acute suicidal or self-harming behaviors, and stabilization of acute on chronic psychiatric conditions.   The patient presentation does not fit with previous diagnosis of schizophrenia and extreme intellectual deficit unless there are cobenfy  has augmented his functioning.  Patient endorses frequent, intense, debilitating rumination about his own reported intellectual shortcomings.  While no standardized testing was done during the initial interview, patient did not appear to have obvious defects and intellectual capacity.  He reports willingness to do almost anything in order to improve his intellectual functioning.  He reports that he takes a number of supplements as noted above in order to improve his cognitive functioning.  These  thoughts bordering on the intrusive.  Differential points to MDD, recurrent, severe with psychotic features (though there are no psychotic features present on my exam today) that is at the root of his suicidal ideation and existential ennui.   Patient requires crisis stabilization, restarting therapy, group therapy.  Patient will also benefit from exercise and logotherapy.  # MDD recurrent, severe with psychotic features Continue cobenfy  (xanomeline- tropsium chloride)100-20 Continue bupropion  450 mg every morning  # Generalized anxiety Change outpatient propranolol  10 mg to twice daily as needed to scheduled twice daily, consider increasing Start hydroxyzine  50 mg 3 times daily as needed for anxiety  # Hypertension Continue amlodipine  5 mg   # Safety and Monitoring: - Voluntary  admission to inpatient psychiatric unit for safety, stabilization and treatment. - Daily contact with patient to assess and evaluate symptoms and progress in treatment -  Patient's case to be discussed in multi-disciplinary team meeting -  Observation Level : q15 minute checks -  Vital signs:  q12 hours -  Precautions: suicide, elopement, and assault  4. Discharge Planning:  - Estimated discharge date: 1/30 - Social work and case management to assist with discharge planning and identification of hospital follow-up needs prior to discharge. - Discharge concerns: Need to establish a safety plan; medication compliance and effectiveness. - Discharge goals: Return home with outpatient referrals for mental health follow-up including medication management/psychotherapy.  - Encouraged patient to participate in unit milieu and in scheduled group therapies  - Short Term Goals: Ability to identify changes in lifestyle to reduce recurrence of condition will improve, Ability to verbalize feelings will improve, Ability to disclose and discuss suicidal ideas, Ability to demonstrate self-control will improve, and Ability to  identify and develop effective coping behaviors will improve - Long Term Goals: Improvement in symptoms so as ready for discharge  I certify that inpatient services furnished can reasonably be expected to improve the patient's condition.    NB: This note was created using a voice recognition software as a result there may be grammatical errors inadvertently enclosed that do not reflect the nature of this encounter.   Lynwood Morene Lavone Delsie, MD, PGY-2, Psychiatry Residency  1/24/20269:30 AM      [1]  Social History Tobacco Use  Smoking Status Former   Current packs/day: 0.00   Types: Cigarettes   Quit date: 2023   Years since quitting: 3.0  Smokeless Tobacco Never  [2] No Known Allergies

## 2024-05-15 NOTE — Group Note (Signed)
 LCSW Group Therapy Note  Group Date: 05/15/2024 Start Time: 1000 End Time: 1100   Type of Therapy and Topic:  Group Therapy - Healthy vs Unhealthy Coping Skills  Participation Level:  Active   Description of Group The focus of this group was to determine what unhealthy coping techniques typically are used by group members and what healthy coping techniques would be helpful in coping with various problems. Patients were guided in becoming aware of the differences between healthy and unhealthy coping techniques. Patients were asked to identify 2-3 healthy coping skills they would like to learn to use more effectively.  Therapeutic Goals Patients learned that coping is what human beings do all day long to deal with various situations in their lives Patients defined and discussed healthy vs unhealthy coping techniques Patients identified their preferred coping techniques and identified whether these were healthy or unhealthy Patients determined 2-3 healthy coping skills they would like to become more familiar with and use more often. Patients provided support and ideas to each other   Summary of Patient Progress:  During group, the patient expressed understanding of the topic. Patient proved open to input from peers and feedback from CSW. Patient demonstrated healthy hygene insight into the subject matter, was respectful of peers, and participated throughout the entire session.   Therapeutic Modalities Cognitive Behavioral Therapy Motivational Interviewing  Noah Ramsey, LCSWA 05/15/2024  12:23 PM

## 2024-05-15 NOTE — Progress Notes (Signed)
(  Sleep Hours) -1.5 as of 0530 (Any PRNs that were needed, meds refused, or side effects to meds)- tylenol  and hydroxyzine  @ 0355 (Any disturbances and when (visitation, over night)-none (Concerns raised by the patient)- none (SI/HI/AVH)- denies all

## 2024-05-15 NOTE — [facility] Suicide Risk Assessment (Signed)
 Suicide Risk Assessment  Admission Assessment    [facility] Admission Suicide Risk Assessment   Nursing information obtained from:  Patient Demographic factors:  Male Current Mental Status:  Suicidal ideation indicated by patient Loss Factors:  NA Historical Factors:  Prior suicide attempts Risk Reduction Factors:  Sense of responsibility to family, Religious beliefs about death, Living with another person, especially a relative  Total Time spent with patient: 45 minutes Principal Problem: MDD (major depressive disorder), recurrent episode, severe (HCC) Diagnosis:  Principal Problem:   MDD (major depressive disorder), recurrent episode, severe (HCC)  Subjective Data: Noah Ramsey is a 29 y.o. 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 Patient initially arrived to [facility] on 05/14/2024 for suicidal ideations, and admitted to [facility] voluntary on 05/15/2024 for acute safety concerns, acute suicidal or self-harming behaviors, and stabilization of acute on chronic psychiatric conditions. PMHx is significant for prediabetes, hypertension.    Initial assessment on 05/15/2024 , the patient reports worsening mood symptoms with increasing suicidal ideations over the past few weeks.  Reports history of intrusive thoughts and rumination about all the ways in which I have failed my family.  He reports feelings of worthlessness, self recommendation over diagnosed intellectual disability,difficulties with concentration, anhedonia, anxiety.  He reports no changes to appetite or psychomotor activity.  He denies problems with energy level, sleep disturbance.  Continued Clinical Symptoms:  Alcohol Use Disorder Identification Test Final Score (AUDIT): 0 The Alcohol Use Disorders Identification Test, Guidelines for Use in Primary Care, Second Edition.  World Science Writer [facility]). Score between 0-7:  no or low risk or alcohol related  problems. Score between 8-15:  moderate risk of alcohol related problems. Score between 16-19:  high risk of alcohol related problems. Score 20 or above:  warrants further diagnostic evaluation for alcohol dependence and treatment.   CLINICAL FACTORS:   Severe Anxiety and/or Agitation Depression:   Anhedonia Delusional Hopelessness More than one psychiatric diagnosis   Musculoskeletal: Mental Status Exam:   Appearance: Obese, casually groomed African-American male.  Behavior: Good eye contact minimal fidgeting  Attitude: Cooperative, agreeable  Speech: Regular rate and rhythm.  Diction indicative of education  Mood:  I feel really down and anxious  Affect: Somewhat incongruent.  Patient's affect is calm, euthymic  Thought Process: Linear, coherent  Thought Content: Ruminative behavior about his own intellectual deficits  SI/HI: Endorses chronic passive SI.  Denies active si today.  Denies any history of HI  Perceptions: denies, not seen responding  Judgement: Fair  Insight: Fair  Fund of Knowledge: WNL     Physical Exam: Vitals and nursing note reviewed.  Constitutional:      Appearance: He is obese.  HENT:     Head: Normocephalic and atraumatic.     Nose: Nose normal.  Musculoskeletal:        General: Normal range of motion.  Skin:    General: Skin is warm and dry.  Neurological:     Mental Status: He is alert.  Psychiatric:        Attention and Perception: Attention and perception normal.        Mood and Affect: Affect normal. Mood is anxious and depressed.        Speech: Speech normal.        Behavior: Behavior normal. Behavior is cooperative.        Thought Content: Thought content is not paranoid or delusional. Thought content includes suicidal ideation. Thought content does  not include homicidal ideation.        Cognition and Memory: Cognition and memory normal.        Judgment: Judgment normal.     Review of Systems  Constitutional:  Negative for  chills, fever and weight loss.  Respiratory:  Negative for cough.   Cardiovascular:  Negative for chest pain.  Gastrointestinal:  Negative for abdominal pain, constipation, diarrhea, heartburn, nausea and vomiting.  Genitourinary:  Negative for dysuria.  Neurological:  Negative for headaches.  Psychiatric/Behavioral:  Positive for depression and suicidal ideas. Negative for hallucinations and substance abuse. The patient is nervous/anxious. The patient does not have insomnia.    Blood pressure 125/80, pulse (!) 106, temperature 99.1 F (37.3 C), temperature source Oral, resp. rate 12, height 5' 7.5 (1.715 m), weight (!) 161 kg, SpO2 97%. Body mass index is 54.78 kg/m.   COGNITIVE FEATURES THAT CONTRIBUTE TO RISK:  Thought constriction (tunnel vision)    SUICIDE RISK:   Severe:  Frequent, intense, and enduring suicidal ideation, specific plan, no subjective intent, but some objective markers of intent (i.e., choice of lethal method), the method is accessible, some limited preparatory behavior, evidence of impaired self-control, severe dysphoria/symptomatology, multiple risk factors present, and few if any protective factors, particularly a lack of social support.  PLAN OF CARE: See H&P  I certify that inpatient services furnished can reasonably be expected to improve the patient's condition.   Lynwood Morene Lavone Delsie, MD 05/15/2024, 9:27 AM

## 2024-05-15 NOTE — Group Note (Signed)
 Date:  05/15/2024 Time:  4:52 PM  Group Topic/Focus: Karaoke Group  Patients participated in singing songs individually and as a group. The activity encouraged creativity, peer support, and confidence building, while utilizing music as a positive coping mechanism and means of self-expression    Participation Level:  Did Not Attend  Additional Comments:  Pt did not attend group  Kristi HERO [facility] 05/15/2024, 4:52 PM

## 2024-05-15 NOTE — Plan of Care (Signed)
 Nurse discussed anxiety, depression and coping skills with patient.

## 2024-05-15 NOTE — Group Note (Signed)
 Date:  05/15/2024 Time:  4:28 PM  Group Topic/Focus:  Rediscovering Joy:   The focus of this group is to explore various ways to relieve stress in a positive manner.     Participation Level:  Active  Participation Quality:  Attentive  Affect:  Appropriate  Cognitive:  Alert  Insight: Appropriate  Engagement in Group:  Engaged  Modes of Intervention:  Discussion, Exploration, and Socialization   Inocente PARAS Mehdi Gironda 05/15/2024, 4:28 PM

## 2024-05-15 NOTE — Progress Notes (Signed)
 D:  Patient's self inventory sheet, patient has poor sleep, sleep medication helpful.  Good appetite, normal energy level, good concentration.  Rated depression 1, hopeless 7, anxiety 2.  Denied withdrawals.  Denied SI.  Denied physical problems.  Denied physical pain.  Pain medicine helpful.  Goal is anxiety management.  Plans to attend groups.  Does have discharge plans. A:  Medications administered per MD orders.  Emotional support and encouragement given patient. R:  Denied SI and HI, contracts for safety.  Denied A/V hallucinations.  Safety maintained with 15 minute checks.

## 2024-05-15 NOTE — Group Note (Signed)
 Date:  05/15/2024 Time:  9:16 AM  Group Topic/Focus: Goals Group  Patients participated in an research scientist (life sciences) by sharing their favorite foods and their feelings about the weekend snow. Patients then completed worksheets focused on developing SMART goals and were encouraged to share recovery-related goals with the group. The group promoted positive social interaction, engagement, and provided a supportive environment for safe self-expression and vulnerability.   Participation Level:  Active  Participation Quality:  Attentive, Drowsy, and Sharing  Affect:  Appropriate  Cognitive:  Alert and Appropriate  Insight: Good and Improving  Engagement in Group:  Engaged and Improving  Modes of Intervention:  Discussion, Exploration, Socialization, and Support  Additional Comments:  Pt attended and actively participated in group  [name] Plaza 05/15/2024, 9:16 AM

## 2024-05-15 NOTE — Group Note (Signed)
 Date:  05/15/2024 Time:  12:54 PM  Group Topic/Focus: Physical Wellness Group  Patients viewed two TED Talks focused on physical exercise and its benefits for mental health. Prior to viewing, patients engaged in discussion about their perceptions of physical wellness and mental health and related these concepts to their own experiences. Group members explored and weighed the positive and negative effects of physical activity as a coping mechanism, demonstrating insight and self-reflection.    Participation Level:  Did Not Attend  Additional Comments:  Pt did not attend group  Kristi HERO [facility] 05/15/2024, 12:54 PM

## 2024-05-15 NOTE — Progress Notes (Signed)
 Patient VS have been reported to MD throughout the day.

## 2024-05-15 NOTE — [facility] Group Notes (Signed)
 Pt attended half of the CSW group

## 2024-05-16 MED ORDER — TRAZODONE HCL 50 MG PO TABS
50.0000 mg | ORAL_TABLET | Freq: Once | ORAL | Status: DC
  Filled 2024-05-16: qty 1

## 2024-05-16 NOTE — [facility] Suicide Risk Assessment (Signed)
 [facility] INPATIENT:  Family/Significant Other Suicide Prevention Education  Suicide Prevention Education:  Contact Attempts: Blayne Frankie, (mom [phone]) has been identified by the patient as the family member/significant other with whom the patient will be residing, and identified as the person(s) who will aid the patient in the event of a mental health crisis.  With written consent from the patient, two attempts were made to provide suicide prevention education, prior to and/or following the patient's discharge.  We were unsuccessful in providing suicide prevention education.  A suicide education pamphlet was given to the patient to share with family/significant other.    Eyvonne Burchfield O Jennyfer Nickolson 05/16/2024, 2:55 PM

## 2024-05-16 NOTE — Plan of Care (Signed)
  Problem: Education: Goal: Knowledge of [facility] Education information/materials will improve Outcome: Progressing   Problem: Activity: Goal: Sleeping patterns will improve Outcome: Progressing

## 2024-05-16 NOTE — Progress Notes (Signed)
 Patient's self inventory sheet, patient sleeps good, sleep medication helpful.  Good appetite, normal energy level, good concentration.  Denied anxiety, depression and hopeless.  Denied withdrawals.    Hypersalivation  Denied SI.  Denied physical problems.  Denied physical pain, worst pain #1.  Pain medicine helpful.  Goal is discharge plan and med management.  Plans to maintain appropriate behavior.  Does have discharge plans.

## 2024-05-16 NOTE — Progress Notes (Signed)
 D:  Patient denied SI and HI, contracts for safety.  Denied A/V hallucinations.  Denied pain. A:  Medications administered per MD orders.  Emotional support and encouragement given patient. R:  Safety maintained with 15 minute checks. MD aware of patient's VS.

## 2024-05-16 NOTE — [facility] Suicide Risk Assessment (Signed)
 [facility] INPATIENT:  Family/Significant Other Suicide Prevention Education  Suicide Prevention Education:  Education Completed; Landscape Architect,  (mom) has been identified by the patient as the family member/significant other with whom the patient will be residing, and identified as the person(s) who will aid the patient in the event of a mental health crisis (suicidal ideations/suicide attempt).  With written consent from the patient, the family member/significant other has been provided the following suicide prevention education, prior to the and/or following the discharge of the patient.  The suicide prevention education provided includes the following: Suicide risk factors Suicide prevention and interventions National Suicide Hotline telephone number [facility] assessment telephone number [facility] Emergency Assistance 911 [facility] and/or Residential Mobile Crisis Unit telephone number  Request made of family/significant other to: Remove weapons (e.g., guns, rifles, knives), all items previously/currently identified as safety concern.   Remove drugs/medications (over-the-counter, prescriptions, illicit drugs), all items previously/currently identified as a safety concern.  The family member/significant other verbalizes understanding of the suicide prevention education information provided.  The family member/significant other agrees to remove the items of safety concern listed above.  Christina Gintz O Dashanae Longfield 05/16/2024, 6:02 PM

## 2024-05-16 NOTE — Progress Notes (Signed)
(  Sleep Hours) -6.75 (Any PRNs that were needed, meds refused, or side effects to meds)- trazodone  and hydroxyzine   (Any disturbances and when (visitation, over night)-no (Concerns raised by the patient)- no (SI/HI/AVH)- denies

## 2024-05-16 NOTE — Progress Notes (Signed)
 [facility] Inpatient Psychiatry Progress Note  Date: 05/16/2024 Patient: Jacobe Study MRN: [id number]   ASSESSMENT:  Kahlil Cowans is a 29 y.o. 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 Patient initially arrived to [facility] on 05/14/2024 for suicidal ideations, and admitted to [facility] voluntary on 05/15/2024 for acute safety concerns, acute suicidal or self-harming behaviors, and stabilization of acute on chronic psychiatric conditions.    The patient presentation does not fit with previous diagnosis of schizophrenia and extreme intellectual deficit unless there are cobenfy  has augmented his functioning.  Patient endorses frequent, intense, debilitating rumination about his own reported intellectual shortcomings.  While no standardized testing was done during the initial interview, patient did not appear to have obvious defects and intellectual capacity.  He reports willingness to do almost anything in order to improve his intellectual functioning.  He reports that he takes a number of supplements as noted above in order to improve his cognitive functioning.  These thoughts bordering on the intrusive.  Differential points to MDD, recurrent, severe with psychotic features (though there are no psychotic features present on my exam today) that is at the root of his suicidal ideation and existential ennui. Patient requires crisis stabilization, restarting therapy, group therapy.  Patient will also benefit from exercise and logotherapy.  1/25: Patient appears genuinely very well today -- mood is euthymic, his affect is totally normal, no responding to internal stimuli, no reported anxiety, no suicidal ideation.  Patient is able to cogently discuss his current medication regimen as well as some of the underlying psychopharmacology underneath it.  His reported anxiety is very much improved after increasing hydroxyzine  and scheduling  propranolol .  He is politely asking after discharge soon.  Given the rapid, (almost to the point of alarming) shift in his presentation over the last 24 hours and our inability thus far to contact collateral, it may be worth continuing to monitor this patient over the next couple of days in an excess of caution.  May also be worth reaching out to ACT team to confirm patient will receive close follow-up upon discharge from hospital.  No medication changes indicated today.  PLAN:  # MDD recurrent, severe with psychotic features Continue cobenfy  (xanomeline- tropsium chloride)100-20 Continue bupropion  450 mg every morning   # Generalized anxiety Continue propranolol  10 mg scheduled twice daily Continue hydroxyzine  50 mg 3 times daily as needed for anxiety  # Hypertension Continue amlodipine  5 mg    # Safety and Monitoring: - Voluntary  admission to inpatient psychiatric unit for safety, stabilization and treatment. - Daily contact with patient to assess and evaluate symptoms and progress in treatment - Patient's case to be discussed in multi-disciplinary team meeting -  Observation Level : q15 minute checks -  Vital signs:  q12 hours -  Precautions: suicide, elopement, and assault   4. Discharge Planning:  - Estimated discharge date: 2-3 days - Social work and case management to assist with discharge planning and identification of hospital follow-up needs prior to discharge. - Discharge concerns: Need to establish a safety plan; medication compliance and effectiveness. - Discharge goals: Return home with outpatient referrals for mental health follow-up including medication management/psychotherapy.  INTERVAL HISTORY:  HR 108 --> 99. 117/93 --> 125/79. No medication refusals. Slept 7.25 hours. No disturbances or concerns. Denied SI, HI and AVH.   On interview: Patient describes mood as euthymic on interview-believes that his mood and sleep are a lot better.  Believes that his  current medications are the correct ones for my sanity and clarity.  Feels as if the hydroxyzine  has really helped with his racing thoughts -- continues to deny auditory and visual hallucinations in the setting of HI and SI.  Patient voices no intrusive thoughts at all or any concerns at the present moment.  He discusses psychopharmacology at a very high level although he says that he is currently obsessed with researching medications.  Denies medication side effects as well as physical issues at this time.  Feels like a band he is helping with his decision making and planning.  Patient believes that he is fit for discharge today.  Collateral information via [place], Mother, Emergency Contact, [phone]: HIPAA COMPLIANT VOICEMAIL AND CONTACT NUMBER LEFT.   Physical Examination:  Vitals and nursing note reviewed MSK: Normal gait and station  MENTAL STATUS EXAM:  Appearance: Obese, casually groomed African-American male.   Behavior: Good eye contact, no fidgiting today  Attitude: Cooperative, agreeable  Speech: Regular rate and rhythm.  Diction indicative of education  Mood: A lot better today -- patient also says euthymic  Affect:  Patient's affect is calm, euthymic  Thought Process: Linear, coherent  Thought Content: No rumination or delusion noted  SI/HI:  Denies current suicidal or homicidal ideation.   Perceptions: denies, not seen responding  Judgement: Fair  Insight: Fair  Fund of Knowledge: WNL   Lab Results:  No visits with results within 1 Day(s) from this visit.  Latest known visit with results is:  Admission on 05/14/2024, Discharged on 05/15/2024  Component Date Value Ref Range Status   WBC 05/14/2024 4.9  4.0 - 10.5 K/uL Final   RBC 05/14/2024 4.39  4.22 - 5.81 MIL/uL Final   Hemoglobin 05/14/2024 13.9  13.0 - 17.0 g/dL Final   HCT 98/76/7973 40.3  39.0 - 52.0 % Final   MCV 05/14/2024 91.8  80.0 - 100.0 fL Final   MCH 05/14/2024 31.7  26.0 - 34.0 pg  Final   MCHC 05/14/2024 34.5  30.0 - 36.0 g/dL Final   RDW 98/76/7973 13.1  11.5 - 15.5 % Final   Platelets 05/14/2024 219  150 - 400 K/uL Final   nRBC 05/14/2024 0.0  0.0 - 0.2 % Final   Neutrophils Relative % 05/14/2024 49  % Final   Neutro Abs 05/14/2024 2.4  1.7 - 7.7 K/uL Final   Lymphocytes Relative 05/14/2024 43  % Final   Lymphs Abs 05/14/2024 2.1  0.7 - 4.0 K/uL Final   Monocytes Relative 05/14/2024 6  % Final   Monocytes Absolute 05/14/2024 0.3  0.1 - 1.0 K/uL Final   Eosinophils Relative 05/14/2024 2  % Final   Eosinophils Absolute 05/14/2024 0.1  0.0 - 0.5 K/uL Final   Basophils Relative 05/14/2024 0  % Final   Basophils Absolute 05/14/2024 0.0  0.0 - 0.1 K/uL Final   Immature Granulocytes 05/14/2024 0  % Final   Abs Immature Granulocytes 05/14/2024 0.01  0.00 - 0.07 K/uL Final   Sodium 05/14/2024 137  135 - 145 mmol/L Final   Potassium 05/14/2024 3.6  3.5 - 5.1 mmol/L Final   Chloride 05/14/2024 99  98 - 111 mmol/L Final   CO2 05/14/2024 25  22 - 32 mmol/L Final   Glucose, Bld 05/14/2024 111 (H)  70 - 99 mg/dL Final   BUN 98/76/7973 14  6 - 20 mg/dL Final   Creatinine, Ser 05/14/2024 1.04  0.61 - 1.24 mg/dL Final   Calcium 98/76/7973 9.3  8.9 -  10.3 mg/dL Final   Total Protein 98/76/7973 7.9  6.5 - 8.1 g/dL Final   Albumin 98/76/7973 4.1  3.5 - 5.0 g/dL Final   AST 98/76/7973 42 (H)  15 - 41 U/L Final   ALT 05/14/2024 50 (H)  0 - 44 U/L Final   Alkaline Phosphatase 05/14/2024 72  38 - 126 U/L Final   Total Bilirubin 05/14/2024 0.3  0.0 - 1.2 mg/dL Final   GFR, Estimated 05/14/2024 >60  >60 mL/min Final   Anion gap 05/14/2024 13  5 - 15 Final   Hgb A1c MFr Bld 05/14/2024 5.8 (H)  4.8 - 5.6 % Final   Mean Plasma Glucose 05/14/2024 119.76  mg/dL Final   Magnesium  05/14/2024 1.7  1.7 - 2.4 mg/dL Final   Alcohol, Ethyl (B) 05/14/2024 <15  <15 mg/dL Final   Cholesterol 98/76/7973 140  0 - 200 mg/dL Final   Triglycerides 98/76/7973 182 (H)  <150 mg/dL Final   HDL  98/76/7973 40 (L)  >40 mg/dL Final   Total CHOL/HDL Ratio 05/14/2024 3.5  RATIO Final   VLDL 05/14/2024 36  0 - 40 mg/dL Final   LDL Cholesterol 05/14/2024 63  0 - 99 mg/dL Final   TSH 98/76/7973 2.080  0.350 - 4.500 uIU/mL Final   Color, Urine 05/14/2024 YELLOW  YELLOW Final   APPearance 05/14/2024 HAZY (A)  CLEAR Final   Specific Gravity, Urine 05/14/2024 1.020  1.005 - 1.030 Final   pH 05/14/2024 6.0  5.0 - 8.0 Final   Glucose, UA 05/14/2024 NEGATIVE  NEGATIVE mg/dL Final   Hgb urine dipstick 05/14/2024 NEGATIVE  NEGATIVE Final   Bilirubin Urine 05/14/2024 NEGATIVE  NEGATIVE Final   Ketones, ur 05/14/2024 NEGATIVE  NEGATIVE mg/dL Final   Protein, ur 98/76/7973 NEGATIVE  NEGATIVE mg/dL Final   Nitrite 98/76/7973 NEGATIVE  NEGATIVE Final   Leukocytes,Ua 05/14/2024 NEGATIVE  NEGATIVE Final   POC Amphetamine UR 05/14/2024 None Detected  NONE DETECTED (Cut Off Level 1000 ng/mL) Final   POC Secobarbital (BAR) 05/14/2024 None Detected  NONE DETECTED (Cut Off Level 300 ng/mL) Final   POC Buprenorphine (BUP) 05/14/2024 None Detected  NONE DETECTED (Cut Off Level 10 ng/mL) Final   POC Oxazepam (BZO) 05/14/2024 None Detected  NONE DETECTED (Cut Off Level 300 ng/mL) Final   POC Cocaine UR 05/14/2024 None Detected  NONE DETECTED (Cut Off Level 300 ng/mL) Final   POC Methamphetamine UR 05/14/2024 None Detected  NONE DETECTED (Cut Off Level 1000 ng/mL) Final   POC Morphine 05/14/2024 None Detected  NONE DETECTED (Cut Off Level 300 ng/mL) Final   POC Methadone UR 05/14/2024 None Detected  NONE DETECTED (Cut Off Level 300 ng/mL) Final   POC Oxycodone  UR 05/14/2024 None Detected  NONE DETECTED (Cut Off Level 100 ng/mL) Final   POC Marijuana UR 05/14/2024 None Detected  NONE DETECTED (Cut Off Level 50 ng/mL) Final     Vitals: Blood pressure 125/79, pulse 99, temperature 98 F (36.7 C), temperature source Oral, resp. rate 20, height 5' 7.5 (1.715 m), weight (!) 161 kg, SpO2 96%.    Odis Cleveland  MD PGY-2, Psychiatry Residency  05/16/2024, 8:17 AM

## 2024-05-16 NOTE — Plan of Care (Signed)
 Nurse discussed anxiety, depression and coping skills with patient.

## 2024-05-16 NOTE — Progress Notes (Signed)
 Patient stated he has been diagnosed with PTSD in the past.  He does not know how to shut off those PTSD thoughts.  Even when he is not stressing he gets those intrusive thoughts.  Has been diagnosed by clinical SW on intake interview that company called [employer]. He has not been taking his propanolol for the past 2 weeks prior to [facility] admission.  He does not believe that he has PTSD. Patient denied SI and HI, contracts for safety.  Denied A/V hallucinations.  Denied pain.

## 2024-05-16 NOTE — Group Note (Signed)
 Date:  05/16/2024 Time:  10:54 PM  Group Topic/Focus:  Wrap-Up Group:   The focus of this group is to help patients review their daily goal of treatment and discuss progress on daily workbooks.    Participation Level:  Did Not Attend  Participation Quality:  Resistant  Affect:  Resistant  Cognitive:  Lacking  Insight: Lacking  Engagement in Group:  Resistant  Modes of Intervention:  Discussion  Additional Comments:  Patient did not attend wrap up group this evening   Noah Ramsey 05/16/2024, 10:54 PM

## 2024-05-16 NOTE — BHH Counselor (Signed)
 Adult Comprehensive Assessment  Patient ID: Noah Ramsey, male   DOB: 29-Jul-1995, 29 y.o.   MRN: [id number]  Information Source: Information source: Patient  Current Stressors:  Patient states their primary concerns and needs for treatment are:: My anxiety had escalated, and my family was worried about me. Patient states their goals for this hospitilization and ongoing recovery are:: to get on the rigth medication, so it will help me with the decrease of my emotion. Educational / Learning stressors: Yes, growing up I needed help in school I have to go to IEP Employment / Job issues: Yes, work has been a  stressor. I don't the production designer, theatre/television/film. Family Relationships: none reported Financial / Lack of resources (include bankruptcy): yes, it is a stressor. I get SSI check not enough. Housing / Lack of housing: none reported Physical health (include injuries & life threatening diseases): Yes, it's hard for me to lose weight. I go to the gym and I don't see result. Social relationships: When I get like this I don't want to do anything with anyone or go out Substance abuse: I don't drink or do drugs Bereavement / Loss: well, I felt like I lost my brother, he lives in [place] right now.  Living/Environment/Situation:  Living Arrangements: Parent, Other relatives Living conditions (as described by patient or guardian): its supportive and healthy for me. Who else lives in the home?: My mom, step father and my brothers How long has patient lived in current situation?: all my life with m What is atmosphere in current home: Comfortable, Paramedic, Supportive  Family History:  Marital status: Single Are you sexually active?: No What is your sexual orientation?: Heterosexual Has your sexual activity been affected by drugs, alcohol, medication, or emotional stress?: none reported Does patient have children?: No  Childhood History:  By whom was/is the patient raised?: Both  parents Additional childhood history information: Good, parents divorced Description of patient's relationship with caregiver when they were a child: Good Patient's description of current relationship with people who raised him/her: It was good How were you disciplined when you got in trouble as a child/adolescent?: Grounded/Spankings Does patient have siblings?: Yes Number of Siblings: 3 Description of patient's current relationship with siblings: States he has 3 brothers with which he has good relationships Did patient suffer any verbal/emotional/physical/sexual abuse as a child?: No Did patient suffer from severe childhood neglect?: No Has patient ever been sexually abused/assaulted/raped as an adolescent or adult?: No Was the patient ever a victim of a crime or a disaster?: No Witnessed domestic violence?: No Has patient been affected by domestic violence as an adult?: No  Education:  Highest grade of school patient has completed: 12 grade and trade school Currently a student?: No Learning disability?: Yes What learning problems does patient have?: I had IEP while in school  Employment/Work Situation:   Employment Situation: On disability Why is Patient on Disability: Due to dx ofSchizophrenia. How Long has Patient Been on Disability: Since 2022. Patient's Job has Been Impacted by Current Illness: No What is the Longest Time Patient has Held a Job?: on and off, 7 months Where was the Patient Employed at that Time?: [employer] Has Patient ever Been in the [employer]?: No  Financial Resources:   Financial resources: Receives SSI Does patient have a lawyer or guardian?: No  Alcohol/Substance Abuse:   What has been your use of drugs/alcohol within the last 12 months?: none reported If attempted suicide, did drugs/alcohol play a role in this?: No Alcohol/Substance  Abuse Treatment Hx: Past Tx, Inpatient If yes, describe treatment and response: outpatient  discontinue, no liable transportation Is patient motivated for change?: Yes Does patient live in an environment that promotes recovery or serves as an obstacle to recovery?: Yes - promotes recovery Describe how the environment promotes recovery or serves as an obstacle to recovery: The patient lives with supportive mother but does not have a liable tranpsortation Are others in the home using alcohol or other substances?: No Has alcohol/substance abuse ever caused legal problems?: No  Social Support System:   Conservation Officer, Nature Support System: Good Describe Community Support System: I have my mom and we use resources in the community Type of faith/religion: I am a christian How does patient's faith help to cope with current illness?: it gives me spiritual awakening.  Leisure/Recreation:   Do You Have Hobbies?: Yes Leisure and Hobbies: Watching TV, playing games on computer, reading  Strengths/Needs:   What is the patient's perception of their strengths?: I am cooperative and willing to learn Patient states they can use these personal strengths during their treatment to contribute to their recovery: I always want to learn and better myself Patient states these barriers may affect/interfere with their treatment: I don't know, I just know that when I get this way. I accept that I need to come to the hospital. Patient states these barriers may affect their return to the community: it will stop me from moving forward in life. Other important information patient would like considered in planning for their treatment: patient is indecisive about returning to work; if return to work patient will lose current health insurance  Discharge Plan:   Currently receiving community mental health services: No Patient states concerns and preferences for aftercare planning are: Patient would like to look for service that can be access through telehealth Patient states they will know when they are  safe and ready for discharge when: I don't know Does patient have financial barriers related to discharge medications?: No Patient description of barriers related to discharge medications: my insurance Will patient be returning to same living situation after discharge?: Yes  Summary/Recommendations:   Summary and Recommendations (to be completed by the evaluator): Demoni Gergen is a 29 years-old African American male. The patient stated he admitted himself to [facility] on 05/14/2024 for suicidal ideation and was then transferred to [facility] for further evaluation and stabilization of chronic psychiatric conditions.  The patient shares he lives with his mother, stepfather and brothers who worries about his mental conditions. The patient reports that he was IVC'ed to [facility] last year and is aware of the symptoms and himself in. The patient shared that he was feeling agitated with ruminating thoughts and his anxiety increased that it was transparent to his family. The patient express stress with finance, work and nonreliable vehicle for transporting. The client express that he would like to go back to work but worries he will make too much income and lose SSI benefit. Patient will benefit from crisis stabilization, medication evaluation, group therapy and psychoeducation, in addition to case management for discharge planning. At discharge it is  recommended that Patient adhere to the established discharge plan and continue in treatment.  Jairy Angulo O Roston Grunewald. 05/16/2024

## 2024-05-16 NOTE — Group Note (Signed)
 Date:  05/16/2024 Time:  11:43 AM  Group Topic/Focus:  Wellness Toolbox:   The focus of this group is to discuss various aspects of wellness, balancing those aspects and exploring ways to increase the ability to experience wellness.  Patients will create a wellness toolbox for use upon discharge.    Participation Level:  Did Not Attend  Noah Ramsey 05/16/2024, 11:43 AM

## 2024-05-16 NOTE — Progress Notes (Signed)
(  Sleep Hours) -7.25 as of 0530 (Any PRNs that were needed, meds refused, or side effects to meds)- prn hydroxyzine  and trazodone  @ 2211, prn 2nd hydroxyzine  @ 0022 (Any disturbances and when (visitation, over night)-none (Concerns raised by the patient)- none (SI/HI/AVH)- denies all

## 2024-05-16 NOTE — Group Note (Signed)
 Date:  05/16/2024 Time:  10:09 AM  Group Topic/Focus:  Goals Group:   The focus of this group is to help patients establish daily goals to achieve during treatment and discuss how the patient can incorporate goal setting into their daily lives to aide in recovery.    Participation Level:  Active  Participation Quality:  Appropriate  Affect:  Appropriate  Cognitive:  Alert  Insight: Good  Engagement in Group:  Engaged  Modes of Intervention:  Discussion  Additional Comments:    Noah Ramsey 05/16/2024, 10:09 AM

## 2024-05-17 ENCOUNTER — Encounter (HOSPITAL_COMMUNITY): Payer: Self-pay

## 2024-05-17 MED ORDER — TRAZODONE HCL 100 MG PO TABS
100.0000 mg | ORAL_TABLET | Freq: Every evening | ORAL | Status: DC | PRN
  Administered 2024-05-17: 100 mg via ORAL
  Filled 2024-05-17: qty 1

## 2024-05-17 NOTE — Plan of Care (Signed)
  Problem: Education: Goal: Emotional status will improve Outcome: Progressing Goal: Verbalization of understanding the information provided will improve Outcome: Progressing   Problem: Activity: Goal: Interest or engagement in activities will improve Outcome: Progressing Goal: Sleeping patterns will improve Outcome: Progressing

## 2024-05-17 NOTE — Progress Notes (Signed)
(  Sleep Hours) -8.5 (Any PRNs that were needed, meds refused, or side effects to meds)- trazodone  and hydroxyzine  (Any disturbances and when (visitation, over night)-no (Concerns raised by the patient)- no (SI/HI/AVH)- denies

## 2024-05-17 NOTE — Plan of Care (Signed)
   Problem: Education: Goal: Emotional status will improve Outcome: Progressing Goal: Mental status will improve Outcome: Progressing

## 2024-05-17 NOTE — Progress Notes (Signed)
" °   05/17/24 0800  Psych Admission Type (Psych Patients Only)  Admission Status Voluntary  Psychosocial Assessment  Patient Complaints None  Eye Contact Fair  Facial Expression Flat  Affect Appropriate to circumstance  Speech Logical/coherent  Interaction Assertive  Motor Activity Slow  Appearance/Hygiene Unremarkable  Behavior Characteristics Cooperative;Anxious  Mood Depressed;Anxious  Thought Process  Coherency WDL  Content WDL  Delusions None reported or observed  Perception WDL  Hallucination None reported or observed  Judgment WDL  Confusion None  Danger to Self  Current suicidal ideation? Denies  Agreement Not to Harm Self Yes  Description of Agreement verbal  Danger to Others  Danger to Others None reported or observed    "

## 2024-05-17 NOTE — BHH Group Notes (Signed)
 [facility] Group Notes:  (Nursing/MHT/Case Management/Adjunct)  Date:  05/17/2024  Time:  10:09 PM  Type of Therapy:  Group Therapy  Participation Level:  Minimal  Participation Quality:  Attentive  Affect:  Appropriate  Cognitive:  Appropriate  Insight:  Limited  Engagement in Group:  Limited  Modes of Intervention:  Education  Summary of Progress/Problems: The patient attended the evening A.A. speaker's meeting and was appropriate.   Fernado Brigante S 05/17/2024, 10:09 PM

## 2024-05-17 NOTE — Progress Notes (Signed)
 Patient stated slept better, 6 hours last night.  Not as many intrusive thoughts.  Feel like he can get ready for discharge pretty soon.  Don't really have any intentions of self harm or anything.  Patient denied SI and HI, contracts for safety.  Denied A/V hallucinations.   Tylenol  for back ache #5 and atarax  for anxiety administered.

## 2024-05-17 NOTE — BHH Suicide Risk Assessment (Incomplete)
 [facility] Discharge Suicide Risk Assessment   Principal Problem: MDD (major depressive disorder), recurrent episode, severe (HCC) Discharge Diagnoses: Principal Problem:   MDD (major depressive disorder), recurrent episode, severe (HCC)  Noah Ramsey is a 29 y.o. 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 Patient initially arrived to [facility] on 05/14/2024 for suicidal ideations, and admitted to [facility] voluntary on 05/15/2024 for acute safety concerns, acute suicidal or self-harming behaviors, and stabilization of acute on chronic psychiatric conditions. PMHx is significant for prediabetes, hypertension.    Musculoskeletal: Strength & Muscle Tone: within normal limits Gait & Station: normal Patient leans: N/A  Psychiatric Specialty Exam  Appearance: Obese, casually groomed African-American male.  Behavior: Good eye contact minimal fidgeting  Attitude: Cooperative, agreeable  Speech: Regular rate and rhythm.  Diction indicative of education  Mood: Rejuvinated  Affect: Euthymic  Thought Process: Linear, coherent  Thought Content: No delusions detected.   SI/HI: Denies SI and HI   Perceptions: denies, not seen responding  Judgement: Good  Insight: Fair  Fund of Knowledge: WNL    Physical Exam: Physical Exam Constitutional:      General: He is not in acute distress.    Appearance: He is not ill-appearing.  HENT:     Head: Normocephalic and atraumatic.     Nose: No rhinorrhea.     Mouth/Throat:     Pharynx: No oropharyngeal exudate or posterior oropharyngeal erythema.  Eyes:     Extraocular Movements: Extraocular movements intact.  Pulmonary:     Effort: Pulmonary effort is normal. No respiratory distress.  Musculoskeletal:        General: No deformity. Normal range of motion.     Cervical back: Normal range of motion.  Skin:    General: Skin is warm and dry.  Neurological:     Mental Status: He  is alert and oriented to person, place, and time. Mental status is at baseline.    Review of Systems  Constitutional:  Negative for chills and fever.  Cardiovascular:  Negative for chest pain and palpitations.  Gastrointestinal:  Negative for nausea and vomiting.   Blood pressure 125/79, pulse 99, temperature 98 F (36.7 C), temperature source Oral, resp. rate 20, height 5' 7.5 (1.715 m), weight (!) 161 kg, SpO2 96%. Body mass index is 54.78 kg/m.  Mental Status Per Nursing Assessment::   On Admission:  Suicidal ideation indicated by patient  Nursing information obtained from:  Patient Demographic factors:  Male Current Mental Status:  Suicidal ideation indicated by patient Loss Factors:  NA Historical Factors:  Prior suicide attempts Risk Reduction Factors:  Sense of responsibility to family, Religious beliefs about death, Living with another person, especially a relative   Continued Clinical Symptoms:  More than one psychiatric diagnosis Previous Psychiatric Diagnoses and Treatments  Cognitive Features That Contribute To Risk:  None    Suicide Risk:  Mild: There are no identifiable suicide plans, no associated intent, mild dysphoria and related symptoms, good self-control (both objective and subjective assessment), few other risk factors, and identifiable protective factors, including available and accessible social support.    Follow-up Information     [facility]. Schedule an appointment as soon as possible for a visit.   Contact information: [address] [place] [address] [phone]         [facility] Follow up on 05/25/2024.   Why: You have a hospital follow up appointment for therapy and medication management services  on 05/25/24 at 1:30 pm  .  The appointment will be Virtual, telehealth. Contact information: [address]  [address] [place] [address] [phone]                 Plan Of Care/Follow-up recommendations:    Activity: as tolerated   Diet: heart healthy   -Follow-up with your outpatient psychiatric provider -instructions on appointment date, time, and address (location) are provided to you in discharge paperwork.   -Take your psychiatric medications as prescribed at discharge - instructions are provided to you in the discharge paperwork   -Follow-up with outpatient primary care doctor and other specialists -for management of chronic medical disease, including: Pre-diabetes    -Testing: Follow-up with outpatient provider for abnormal lab results: High triglycerides (182), High A1c (5.8%), slightly elevated liver enzymes (AST, ALT)    -Recommend abstinence from alcohol, tobacco, and other illicit drug use at discharge.    -If your psychiatric symptoms recur, worsen, or if you have side effects to your psychiatric medications, call your outpatient psychiatric provider, 911, [phone] or go to the nearest emergency department.   -If suicidal thoughts recur, call your outpatient psychiatric provider, 911, [phone] or go to the nearest emergency department.  Shay Jhaveri, MD 05/17/2024, 12:30 PM

## 2024-05-17 NOTE — BH IP Treatment Plan (Signed)
 Interdisciplinary Treatment and Diagnostic Plan Update  05/17/2024 Time of Session: 10:30 AM Noah Ramsey MRN: [id number]  Principal Diagnosis: MDD (major depressive disorder), recurrent episode, severe (HCC)  Secondary Diagnoses: Principal Problem:   MDD (major depressive disorder), recurrent episode, severe (HCC)   Current Medications:  Current Facility-Administered Medications  Medication Dose Route Frequency Provider Last Rate Last Admin   acetaminophen  (TYLENOL ) tablet 650 mg  650 mg Oral Q6H PRN Bobbitt, Shalon E, NP   650 mg at 05/17/24 0722   alum & mag hydroxide-simeth (MAALOX/MYLANTA) 200-200-20 MG/5ML suspension 30 mL  30 mL Oral Q4H PRN Bobbitt, Shalon E, NP       amLODipine  (NORVASC ) tablet 5 mg  5 mg Oral Daily Bobbitt, Shalon E, NP   5 mg at 05/17/24 9276   buPROPion  (WELLBUTRIN  XL) 24 hr tablet 450 mg  450 mg Oral q morning Bobbitt, Shalon E, NP   450 mg at 05/17/24 1034   Cholecalciferol  TABS 5,000 Units  5,000 Units Oral Daily Prentis Kitchens A, DO   5,000 Units at 05/17/24 0725   haloperidol  (HALDOL ) tablet 5 mg  5 mg Oral TID PRN Bobbitt, Shalon E, NP       And   diphenhydrAMINE  (BENADRYL ) capsule 50 mg  50 mg Oral TID PRN Bobbitt, Shalon E, NP       haloperidol  lactate (HALDOL ) injection 5 mg  5 mg Intramuscular TID PRN Bobbitt, Shalon E, NP       And   diphenhydrAMINE  (BENADRYL ) injection 50 mg  50 mg Intramuscular TID PRN Bobbitt, Shalon E, NP       And   LORazepam  (ATIVAN ) injection 2 mg  2 mg Intramuscular TID PRN Bobbitt, Shalon E, NP       haloperidol  lactate (HALDOL ) injection 10 mg  10 mg Intramuscular TID PRN Bobbitt, Shalon E, NP       And   diphenhydrAMINE  (BENADRYL ) injection 50 mg  50 mg Intramuscular TID PRN Bobbitt, Shalon E, NP       And   LORazepam  (ATIVAN ) injection 2 mg  2 mg Intramuscular TID PRN Bobbitt, Shalon E, NP       hydrOXYzine  (ATARAX ) tablet 50 mg  50 mg Oral TID PRN Delsie Lynwood Morene Lavone, MD   50 mg at 05/17/24 9275    magnesium  hydroxide (MILK OF MAGNESIA) suspension 30 mL  30 mL Oral Daily PRN Bobbitt, Shalon E, NP       metFORMIN  (GLUCOPHAGE ) tablet 1,000 mg  1,000 mg Oral BID WC Delsie Lynwood Morene Lavone, MD   1,000 mg at 05/17/24 9276   propranolol  (INDERAL ) tablet 10 mg  10 mg Oral BID Bobbitt, Shalon E, NP   10 mg at 05/17/24 9275   traZODone  (DESYREL ) tablet 100 mg  100 mg Oral QHS PRN Rollene Morene, MD       Xanomeline-Trospium  Chloride 100-20 MG CAPS 100 mg  100 mg Oral Daily Delsie Lynwood Morene Lavone, MD   100 mg at 05/17/24 9273   PTA Medications: Medications Prior to Admission  Medication Sig Dispense Refill Last Dose/Taking   benztropine  (COGENTIN ) 0.5 MG tablet Take 1 tablet (0.5 mg total) by mouth 2 (two) times daily for 14 days. 28 tablet 0 Past Week   buPROPion  HCl ER, XL, 450 MG TB24 Take 450 mg by mouth daily.   Taking   calcium carbonate (OS-CAL - DOSED IN MG OF ELEMENTAL CALCIUM) 1250 (500 Ca) MG tablet Take 1 tablet by mouth daily with breakfast.   Taking  Cholecalciferol  125 MCG (5000 UT) TABS Take 5,000 Units by mouth daily.   Taking   magnesium  oxide (MAG-OX) 400 (240 Mg) MG tablet Take 400 mg by mouth daily.   Taking   amLODipine  (NORVASC ) 5 MG tablet TAKE 1 TABLET (5 MG TOTAL) BY MOUTH DAILY. 30 tablet 4    COBENFY  100-20 MG CAPS Take by mouth.      metFORMIN  (GLUCOPHAGE ) 1000 MG tablet TAKE 1 TABLET (1,000 MG TOTAL) BY MOUTH TWO (TWO) TIMES DAILY WITH A MEAL. 180 tablet 0    propranolol  (INDERAL ) 10 MG tablet Take 10 mg by mouth 2 (two) times daily.      tirzepatide  (ZEPBOUND ) 10 MG/0.5ML Pen Inject 10 mg into the skin once a week. 2 mL 3     Patient Stressors: Medication change or noncompliance    Patient Strengths: Printmaker for treatment/growth  Physical Health  Supportive family/friends   Treatment Modalities: Medication Management, Group therapy, Case management,  1 to 1 session with clinician, Psychoeducation, Recreational  therapy.   Physician Treatment Plan for Primary Diagnosis: MDD (major depressive disorder), recurrent episode, severe (HCC) Long Term Goal(s):     Short Term Goals: Ability to identify changes in lifestyle to reduce recurrence of condition will improve Ability to verbalize feelings will improve Ability to disclose and discuss suicidal ideas Ability to demonstrate self-control will improve Ability to identify and develop effective coping behaviors will improve  Medication Management: Evaluate patient's response, side effects, and tolerance of medication regimen.  Therapeutic Interventions: 1 to 1 sessions, Unit Group sessions and Medication administration.  Evaluation of Outcomes: Adequate for Discharge  Physician Treatment Plan for Secondary Diagnosis: Principal Problem:   MDD (major depressive disorder), recurrent episode, severe (HCC)  Long Term Goal(s):     Short Term Goals: Ability to identify changes in lifestyle to reduce recurrence of condition will improve Ability to verbalize feelings will improve Ability to disclose and discuss suicidal ideas Ability to demonstrate self-control will improve Ability to identify and develop effective coping behaviors will improve     Medication Management: Evaluate patient's response, side effects, and tolerance of medication regimen.  Therapeutic Interventions: 1 to 1 sessions, Unit Group sessions and Medication administration.  Evaluation of Outcomes: Adequate for Discharge   RN Treatment Plan for Primary Diagnosis: MDD (major depressive disorder), recurrent episode, severe (HCC) Long Term Goal(s): Knowledge of disease and therapeutic regimen to maintain health will improve  Short Term Goals: Ability to remain free from injury will improve, Ability to verbalize frustration and anger appropriately will improve, Ability to demonstrate self-control, Ability to participate in decision making will improve, Ability to verbalize feelings  will improve, Ability to disclose and discuss suicidal ideas, Ability to identify and develop effective coping behaviors will improve, and Compliance with prescribed medications will improve  Medication Management: RN will administer medications as ordered by provider, will assess and evaluate patient's response and provide education to patient for prescribed medication. RN will report any adverse and/or side effects to prescribing provider.  Therapeutic Interventions: 1 on 1 counseling sessions, Psychoeducation, Medication administration, Evaluate responses to treatment, Monitor vital signs and CBGs as ordered, Perform/monitor CIWA, COWS, AIMS and Fall Risk screenings as ordered, Perform wound care treatments as ordered.  Evaluation of Outcomes: Adequate for Discharge   LCSW Treatment Plan for Primary Diagnosis: MDD (major depressive disorder), recurrent episode, severe (HCC) Long Term Goal(s): Safe transition to appropriate next level of care at discharge, Engage patient in therapeutic group addressing interpersonal concerns.  Short Term Goals: Engage patient in aftercare planning with referrals and resources, Increase social support, Increase ability to appropriately verbalize feelings, Increase emotional regulation, Facilitate acceptance of mental health diagnosis and concerns, Facilitate patient progression through stages of change regarding substance use diagnoses and concerns, Identify triggers associated with mental health/substance abuse issues, and Increase skills for wellness and recovery  Therapeutic Interventions: Assess for all discharge needs, 1 to 1 time with Social worker, Explore available resources and support systems, Assess for adequacy in community support network, Educate family and significant other(s) on suicide prevention, Complete Psychosocial Assessment, Interpersonal group therapy.  Evaluation of Outcomes: Adequate for Discharge   Progress in Treatment: Attending  groups: Yes. Participating in groups: Yes. Taking medication as prescribed: Yes. Toleration medication: Yes. Family/Significant other contact made: Yes, individual(s) contacted:  Collis Thede (mother) [phone]  Patient understands diagnosis: Yes. Discussing patient identified problems/goals with staff: Yes. Medical problems stabilized or resolved: Yes. Denies suicidal/homicidal ideation: Yes. Issues/concerns per patient self-inventory: No. None reported.  New problem(s) identified: No, Describe:  None identified.   New Short Term/Long Term Goal(s):Medication stabilization, elimination of SI thoughts, development of comprehensive mental wellness plan.    Patient Goals: Become more medication compliant    Discharge Plan or Barriers: No barriers, patient will discharge to mother's home.    Reason for Continuation of Hospitalization: Medication stabilization  Estimated Length of Stay: 1-3 days.   Last 3 [place] Suicide Severity Risk Score: Flowsheet Row Admission (Current) [facility] from [facility]14[facility] from [facility]/16/2023 in [facility]  C-SSRS RISK CATEGORY Moderate Risk Moderate Risk No Risk    Last PHQ 2/9 Scores:    12/08/2023    2:12 PM 07/30/2023   11:41 AM 03/19/2023    1:42 PM  Depression screen PHQ 2/9  Decreased Interest 3 3 0  Down, Depressed, Hopeless 3 3 3   PHQ - 2 Score 6 6 3   Altered sleeping 0 0 0  Tired, decreased energy 3 3 0  Change in appetite 2 2 1   Feeling bad or failure about yourself  0 0 0  Trouble concentrating 2 2 0  Moving slowly or fidgety/restless 0 0 0  Suicidal thoughts 0 --   PHQ-9 Score 13  13  4    Difficult doing work/chores Not difficult at all       Data saved with a previous flowsheet row definition    Scribe for Treatment Team: Elantra Caprara  Nunez-Uva, LCSWA 05/17/2024 3:29 PM

## 2024-05-17 NOTE — Discharge Summary (Signed)
 " Physician Discharge Summary Note ***UPDATE DATE OF SERVICE  Patient:  Noah Ramsey is an 29 y.o., male MRN:  [id number] DOB:  [birth year] Patient phone:  [phone] (home)  Patient address:   [address] [place] [facility] [address],   Date of Admission:  05/15/2024 Date of Discharge: 05/18/2024  Reason for Admission:  Acute dysphoria, some suicidal ideation  Principal Problem: MDD (major depressive disorder), recurrent episode, severe (HCC) Discharge Diagnoses: Principal Problem:   MDD (major depressive disorder), recurrent episode, severe (HCC)   Past Psychiatric History:   Current psychiatrist: [facility] under Dr. Akintayo. Current therapist: none. Previous psychiatric diagnoses: Schizophrenia?  Depression, anxiety. Psychiatric hospitalization(s): Yes multiple, twice in this facility previously.  First at age 4 in [place] . Psychotherapy history: In the past none current. Neuromodulation history: denied. History of suicide (obtained from HPI): Multiple suicide attempts via overdose,. History of homicide or aggression (obtained in HPI): denied.  Past Medical History:  Past Medical History:  Diagnosis Date   Complication of anesthesia    Elevated CPK    per patient   Schizophrenia [facility])    History reviewed. No pertinent surgical history. Family History:  Family History  Problem Relation Age of Onset   Mental illness Brother    Family Psychiatric  History:   Psychiatric diagnoses: denied. Suicide history: Reports that his brother attempted suicide.  Violence/aggression: denied. Substance use history: Reports that his brother has various substance use disorders.  Social History:  Social History   Substance and Sexual Activity  Alcohol Use Never     Social History   Substance and Sexual Activity  Drug Use Never    Social History   Socioeconomic History   Marital status: Single    Spouse name: Not on file   Number of children: Not on file    Years of education: Not on file   Highest education level: Associate degree: occupational, scientist, product/process development, or vocational program  Occupational History   Occupation: disablied  Tobacco Use   Smoking status: Former    Current packs/day: 0.00    Types: Cigarettes    Quit date: 2023    Years since quitting: 3.0   Smokeless tobacco: Never  Vaping Use   Vaping status: Never Used  Substance and Sexual Activity   Alcohol use: Never   Drug use: Never   Sexual activity: Not on file  Other Topics Concern   Not on file  Social History Narrative   Lives with family   Social Drivers of Health   Tobacco Use: Medium Risk (05/15/2024)   Patient History    Smoking Tobacco Use: Former    Smokeless Tobacco Use: Never    Passive Exposure: Not on Actuary Strain: Low Risk (03/21/2024)   Overall Financial Resource Strain (CARDIA)    Difficulty of Paying Living Expenses: Not hard at all  Food Insecurity: No Food Insecurity (05/15/2024)   Epic    Worried About Radiation Protection Practitioner of Food in the Last Year: Never true    Ran Out of Food in the Last Year: Never true  Transportation Needs: No Transportation Needs (05/15/2024)   Epic    Lack of Transportation (Medical): No    Lack of Transportation (Non-Medical): No  Physical Activity: Sufficiently Active (03/21/2024)   Exercise Vital Sign    Days of Exercise per Week: 4 days    Minutes of Exercise per Session: 40 min  Stress: Stress Concern Present (03/21/2024)   [employer] of Occupational Health -  Occupational Stress Questionnaire    Feeling of Stress: To some extent  Social Connections: Moderately Isolated (03/21/2024)   Social Connection and Isolation Panel    Frequency of Communication with Friends and Family: More than three times a week    Frequency of Social Gatherings with Friends and Family: More than three times a week    Attends Religious Services: More than 4 times per year    Active Member of Clubs or Organizations: No     Attends Banker Meetings: Not on file    Marital Status: Never married  Depression (PHQ2-9): High Risk (12/08/2023)   Depression (PHQ2-9)    PHQ-2 Score: 13  Alcohol Screen: Low Risk (05/15/2024)   Alcohol Screen    Last Alcohol Screening Score (AUDIT): 0  Housing: Low Risk (05/15/2024)   Epic    Unable to Pay for Housing in the Last Year: No    Number of Times Moved in the Last Year: 1    Homeless in the Last Year: No  Utilities: Not At Risk (05/15/2024)   Epic    Threatened with loss of utilities: No  Health Literacy: Not on file    Hospital Course:   On initial presentation to [facility] 1/24, patient reported worsening mood symptoms with increasing suicidal ideations.  He reported feelings of acute worthlessness anxiety and intrusive thinking.  At no point did he exhibit any psychotic features such as responding to internal stimuli or delusional material.  It appears that patient had some interpersonal issues with his brother's girlfriend and has a lot of longitudinal stressors which led him to this point.  The next day 1/25 patient looked remarkably well -- denying suicidal ideation or homicidal ideation and able to hold conversation.  Patient is very pleasant and cogent regarding his medication and their effects.  We scheduled his home propranolol  to 10 mg twice daily and started hydroxyzine  50 mg 3 times daily as needed for anxiety and patient reported good effect of this.  Given much improved appearance over the course of 48 to 72 hours, no ongoing suicidal or homicidal threat and willingness of mother to bring patient home, he was discharged early a.m. of 1/27.  During the patient's hospitalization, patient had extensive initial psychiatric evaluation, and follow-up psychiatric evaluations every day.  Psychiatric diagnoses provided upon initial assessment:   # MDD recurrent, severe  # Generalized anxiety  Patient's psychiatric medications were adjusted on admission:   #  MDD recurrent, severe with psychotic features Continue cobenfy  (xanomeline- tropsium chloride)100-20 Continue bupropion  450 mg every morning   # Generalized anxiety Change outpatient propranolol  10 mg to twice daily as needed to scheduled twice daily, consider increasing Start hydroxyzine  50 mg 3 times daily as needed for anxiety   # Hypertension Continue amlodipine  5 mg  During the hospitalization, other adjustments were made to the patient's psychiatric medication regimen:   # MDD recurrent, severe with psychotic features Continue cobenfy  (xanomeline- tropsium chloride)100-20 Continue bupropion  450 mg every morning   # Generalized anxiety Continue propranolol  10 mg scheduled twice daily Continue hydroxyzine  50 mg 3 times daily as needed for anxiety   # Hypertension Continue amlodipine  5 mg  Patient's care was discussed during the interdisciplinary team meeting every day during the hospitalization.  The patient denied having side effects to prescribed psychiatric medication.  Gradually, patient started adjusting to milieu. The patient was evaluated each day by a clinical provider to ascertain response to treatment. Improvement was noted by the patient's report of  decreasing symptoms, improved sleep and appetite, affect, medication tolerance, behavior, and participation in unit programming.  Patient was asked each day to complete a self inventory noting mood, mental status, pain, new symptoms, anxiety and concerns.   Symptoms were reported as significantly decreased or resolved completely by discharge.  The patient reports that their mood is stable.  The patient denied having suicidal thoughts for more than 48 hours prior to discharge.  Patient denies having homicidal thoughts.  Patient denies having auditory hallucinations.  Patient denies any visual hallucinations or other symptoms of psychosis.  The patient was motivated to continue taking medication with a goal of continued  improvement in mental health.   The patient reports their target psychiatric symptoms of depressed mood, intrusive thoughts responded well to the psychiatric medications, and the patient reports overall benefit other psychiatric hospitalization. Supportive psychotherapy was provided to the patient. The patient also participated in regular group therapy while hospitalized. Coping skills, problem solving as well as relaxation therapies were also part of the unit programming.  Labs were reviewed with the patient, and abnormal results were discussed with the patient.  The patient is able to verbalize their individual safety plan to this provider.  # It is recommended to the patient to continue psychiatric medications as prescribed, after discharge from the hospital.    # It is recommended to the patient to follow up with your outpatient psychiatric provider and PCP.  # It was discussed with the patient, the impact of alcohol, drugs, tobacco have been there overall psychiatric and medical wellbeing, and total abstinence from substance use was recommended the patient.ed.  # Prescriptions provided or sent directly to preferred pharmacy at discharge. Patient agreeable to plan. Given opportunity to ask questions. Appears to feel comfortable with discharge.    # In the event of worsening symptoms, the patient is instructed to call the crisis hotline, 911 and or go to the nearest ED for appropriate evaluation and treatment of symptoms. To follow-up with primary care provider for other medical issues, concerns and or health care needs  # Patient was discharged home with mother with a plan to follow up as noted below.  On day of discharge ***   Physical Findings: AIMS:  , ,  ,  ,  ,  ,   CIWA:    COWS:     Musculoskeletal: Strength & Muscle Tone: within normal limits Gait & Station: normal Patient leans: N/A   Mental Status Exam  Appearance: Obese, casually groomed African-American male.    Behavior: Good eye contact, no fidgiting today  Attitude: Cooperative, agreeable  Speech: Regular rate and rhythm.  Diction indicative of education  Mood: well  Affect:  Patient's affect is calm, euthymic  Thought Process: Linear, coherent  Thought Content: No rumination or delusion noted  SI/HI:  Denies current suicidal or homicidal ideation.   Perceptions: denies, not seen responding  Judgement: Fair  Insight: Fair  Fund of Knowledge: WNL     Physical Exam: Physical Exam Constitutional:      General: He is not in acute distress.    Appearance: He is not ill-appearing.  HENT:     Head: Normocephalic and atraumatic.     Nose: No rhinorrhea.     Mouth/Throat:     Pharynx: No oropharyngeal exudate or posterior oropharyngeal erythema.  Eyes:     Extraocular Movements: Extraocular movements intact.  Pulmonary:     Effort: Pulmonary effort is normal. No respiratory distress.  Musculoskeletal:  General: No deformity. Normal range of motion.     Cervical back: Normal range of motion.  Skin:    General: Skin is warm and dry.  Neurological:     Mental Status: He is alert and oriented to person, place, and time. Mental status is at baseline.    Review of Systems  Constitutional:  Negative for chills and fever.  Cardiovascular:  Negative for chest pain and palpitations.  Gastrointestinal:  Negative for heartburn and nausea.  Psychiatric/Behavioral:  Negative for suicidal ideas.    Blood pressure 125/79, pulse 99, temperature 98 F (36.7 C), temperature source Oral, resp. rate 20, height 5' 7.5 (1.715 m), weight (!) 161 kg, SpO2 96%. Body mass index is 54.78 kg/m.   Tobacco Use History[1] Tobacco Cessation:  N/A, patient does not currently use tobacco products   Blood Alcohol level:  Lab Results  Component Value Date   [facility] <15 05/14/2024   ETH <10 12/17/2022    Metabolic Disorder Labs:  Lab Results  Component Value Date   HGBA1C 5.8 (H) 05/14/2024   MPG  119.76 05/14/2024   MPG 128.37 12/17/2022   No results found for: PROLACTIN Lab Results  Component Value Date   CHOL 140 05/14/2024   TRIG 182 (H) 05/14/2024   HDL 40 (L) 05/14/2024   CHOLHDL 3.5 05/14/2024   VLDL 36 05/14/2024   LDLCALC 63 05/14/2024   LDLCALC 74 03/22/2024    See Psychiatric Specialty Exam and Suicide Risk Assessment completed by Attending Physician prior to discharge.  Discharge destination:  Home  Is patient on multiple antipsychotic therapies at discharge:  No     Allergies as of 05/17/2024   No Known Allergies   Med Rec must be completed prior to using this [facility]***       Follow-up Information     [facility]. Schedule an appointment as soon as possible for a visit.   Contact information: [address] [place] [address] [phone]         [facility] Follow up on 05/25/2024.   Why: You have a hospital follow up appointment for therapy and medication management services on 05/25/24 at 1:30 pm  .  The appointment will be Virtual, telehealth. Contact information: [address]  [address] [place] [address] [phone]                 Follow-up recommendations/Comments:    Activity: as tolerated   Diet: heart healthy   -Follow-up with your outpatient psychiatric provider -instructions on appointment date, time, and address (location) are provided to you in discharge paperwork.   -Take your psychiatric medications as prescribed at discharge - instructions are provided to you in the discharge paperwork   -Follow-up with outpatient primary care doctor and other specialists -for management of chronic medical disease, including: Pre-diabetes    -Testing: Follow-up with outpatient provider for abnormal lab results: High triglycerides (182), High A1c (5.8%), slightly elevated liver enzymes (AST, ALT)    -Recommend abstinence from alcohol, tobacco, and other illicit drug use at discharge.    -If your  psychiatric symptoms recur, worsen, or if you have side effects to your psychiatric medications, call your outpatient psychiatric provider, 911, [phone] or go to the nearest emergency department.   -If suicidal thoughts recur, call your outpatient psychiatric provider, 911, [phone] or go to the nearest emergency department.  Signed: Kristyana Notte, MD 05/17/2024, 12:26 PM           [1]  Social History Tobacco Use  Smoking Status Former   Current packs/day: 0.00   Types: Cigarettes   Quit date: 2023   Years since quitting: 3.0  Smokeless Tobacco Never   "

## 2024-05-17 NOTE — Group Note (Signed)
 Date:  05/17/2024 Time:  11:41 AM  Group Topic/Focus:  Wellness Toolbox:   The focus of this group is to discuss various aspects of wellness, balancing those aspects and exploring ways to increase the ability to experience wellness.  Patients will create a wellness toolbox for use upon discharge.    Participation Level:  Active  Participation Quality:  Attentive  Affect:  Appropriate  Cognitive:  Alert  Insight: Good  Engagement in Group:  Engaged  Modes of Intervention:  Exercise/Education  Additional Comments:    Noah Ramsey 05/17/2024, 11:41 AM

## 2024-05-17 NOTE — Progress Notes (Signed)
 Spiritual care group on grief and loss facilitated by Chaplain Rockie Sofia, [facility]  Group Goal: Support / Education around grief and loss  Members engage in facilitated group support and psycho-social education.  Group Description:  Following introductions and group rules, group members engaged in facilitated group dialogue and support around topic of loss, with particular support around experiences of loss in their lives. Group Identified types of loss (relationships / self / things) and identified patterns, circumstances, and changes that precipitate losses. Reflected on thoughts / feelings around loss, normalized grief responses, and recognized variety in grief experience. Group encouraged individual reflection on safe space and on the coping skills that they are already utilizing.  Group drew on Adlerian / Rogerian and narrative framework  Patient Progress: Noah Ramsey attended part of group.  Verbal participation was minimal, but he demonstrated engagement in the group conversation for the time that he was present in group.

## 2024-05-17 NOTE — Group Note (Signed)
 Date:  05/17/2024 Time:  8:34 PM  Group Topic/Focus:  Recovery Goals:   The focus of this group is to identify appropriate goals for recovery and establish a plan to achieve them.    Participation Level:  Active  Participation Quality:  Appropriate  Affect:  Appropriate  Cognitive:  Appropriate  Insight: Appropriate  Engagement in Group:  Engaged  Modes of Intervention:  Discussion  Additional Comments:  AA Facilitator  Ellouise Dama Molt 05/17/2024, 8:34 PM

## 2024-05-17 NOTE — Progress Notes (Signed)
 [facility] Inpatient Psychiatry Progress Note  Date: 05/17/2024 Patient: Noah Ramsey MRN: [id number]   ASSESSMENT:  Kenshin Splawn is a 29 y.o. 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 Patient initially arrived to [facility] on 05/14/2024 for suicidal ideations, and admitted to [facility] voluntary on 05/15/2024 for acute safety concerns, acute suicidal or self-harming behaviors, and stabilization of acute on chronic psychiatric conditions.    The patient presentation does not fit with previous diagnosis of schizophrenia and extreme intellectual deficit unless there are cobenfy  has augmented his functioning.  Patient endorses frequent, intense, debilitating rumination about his own reported intellectual shortcomings.  While no standardized testing was done during the initial interview, patient did not appear to have obvious defects and intellectual capacity.  He reports willingness to do almost anything in order to improve his intellectual functioning.  He reports that he takes a number of supplements as noted above in order to improve his cognitive functioning.  These thoughts bordering on the intrusive.  Differential points to MDD, recurrent, severe with psychotic features (though there are no psychotic features present on my exam today) that is at the root of his suicidal ideation and existential ennui. Patient requires crisis stabilization, restarting therapy, group therapy.  Patient will also benefit from exercise and logotherapy.  1/26: Patient appears well today -- internal strife, anxiety, recurrence of thinking appears to have been acutely triggered by circumstantial situation, now resolved.  Mother is amenable to discharge tomorrow a.m.  She will contact ACT team to arrange close follow-up.  No acute safety concerns.  No medication changes today.  PLAN:  # MDD recurrent, severe with psychotic features Continue cobenfy   (xanomeline- tropsium chloride)100-20 Continue bupropion  450 mg every morning   # Generalized anxiety Continue propranolol  10 mg scheduled twice daily Continue hydroxyzine  50 mg 3 times daily as needed for anxiety  # Hypertension Continue amlodipine  5 mg    # Safety and Monitoring: - Voluntary  admission to inpatient psychiatric unit for safety, stabilization and treatment. - Daily contact with patient to assess and evaluate symptoms and progress in treatment - Patient's case to be discussed in multi-disciplinary team meeting -  Observation Level : q15 minute checks -  Vital signs:  q12 hours -  Precautions: suicide, elopement, and assault   4. Discharge Planning:  - Estimated discharge date: 1-3 days - Social work and case management to assist with discharge planning and identification of hospital follow-up needs prior to discharge. - Discharge concerns: Need to establish a safety plan; medication compliance and effectiveness. - Discharge goals: Return home with outpatient referrals for mental health follow-up including medication management/psychotherapy.  INTERVAL HISTORY:  HR 99, 125/79. No medication refusals. Tylenol  650 mg x1. Hydroxyzine  25 mg x2. Patient says he slept 6 hours last night, less intrusive thoughts. Denied self-harm.  Suicide prevention education completed with mother.   On interview: Patient is well-appearing today.  Says the root of his problem is that 2 weeks ago he stopped taking his propranolol .  No issues at this time.  Denies suicidal and homicidal ideation.  Denies auditory and visual hallucinations.  Gave explicit permission to contact his mother.  No new medication side effects or somatic symptomatology.  Says he needs Inderal  and hydroxyzine , but has his other medications at home.  Collateral information via [place], Mother, Emergency Contact, [phone]: I think patient is doing much better compared to how he was here two days ago. He's  got a lot  of things on his mind. He's lonely and doesn't have many friends. Older and living at home. He has a lot of worries. He would like his own place but doesn't see how. Family misses him. His brother and brother's girlfriend are not talking to one another -- so girlfriend was calling Deonta and getting information about the brother. Feel like he's OK to go home. [place] he wanted to hurt himself on Friday. Doesn't sound like he feels like that now. No firearms at home. OK with discharge tomorrow. Would prefer a taxi to go home, lives three miles away. Will call the ACT team to let them know. Someone should come see him more often.   Physical Examination:  Vitals and nursing note reviewed MSK: Normal gait and station  MENTAL STATUS EXAM:  Appearance: Obese, casually groomed African-American male.   Behavior: Good eye contact, no fidgiting today  Attitude: Cooperative, agreeable  Speech: Regular rate and rhythm.  Diction indicative of education  Mood: well  Affect:  Patient's affect is calm, euthymic  Thought Process: Linear, coherent  Thought Content: No rumination or delusion noted  SI/HI:  Denies current suicidal or homicidal ideation.   Perceptions: denies, not seen responding  Judgement: Fair  Insight: Fair  Fund of Knowledge: WNL   Lab Results:  No visits with results within 1 Day(s) from this visit.  Latest known visit with results is:  Admission on 05/14/2024, Discharged on 05/15/2024  Component Date Value Ref Range Status   WBC 05/14/2024 4.9  4.0 - 10.5 K/uL Final   RBC 05/14/2024 4.39  4.22 - 5.81 MIL/uL Final   Hemoglobin 05/14/2024 13.9  13.0 - 17.0 g/dL Final   HCT 98/76/7973 40.3  39.0 - 52.0 % Final   MCV 05/14/2024 91.8  80.0 - 100.0 fL Final   MCH 05/14/2024 31.7  26.0 - 34.0 pg Final   MCHC 05/14/2024 34.5  30.0 - 36.0 g/dL Final   RDW 98/76/7973 13.1  11.5 - 15.5 % Final   Platelets 05/14/2024 219  150 - 400 K/uL Final   nRBC 05/14/2024 0.0  0.0 - 0.2 %  Final   Neutrophils Relative % 05/14/2024 49  % Final   Neutro Abs 05/14/2024 2.4  1.7 - 7.7 K/uL Final   Lymphocytes Relative 05/14/2024 43  % Final   Lymphs Abs 05/14/2024 2.1  0.7 - 4.0 K/uL Final   Monocytes Relative 05/14/2024 6  % Final   Monocytes Absolute 05/14/2024 0.3  0.1 - 1.0 K/uL Final   Eosinophils Relative 05/14/2024 2  % Final   Eosinophils Absolute 05/14/2024 0.1  0.0 - 0.5 K/uL Final   Basophils Relative 05/14/2024 0  % Final   Basophils Absolute 05/14/2024 0.0  0.0 - 0.1 K/uL Final   Immature Granulocytes 05/14/2024 0  % Final   Abs Immature Granulocytes 05/14/2024 0.01  0.00 - 0.07 K/uL Final   Sodium 05/14/2024 137  135 - 145 mmol/L Final   Potassium 05/14/2024 3.6  3.5 - 5.1 mmol/L Final   Chloride 05/14/2024 99  98 - 111 mmol/L Final   CO2 05/14/2024 25  22 - 32 mmol/L Final   Glucose, Bld 05/14/2024 111 (H)  70 - 99 mg/dL Final   BUN 98/76/7973 14  6 - 20 mg/dL Final   Creatinine, Ser 05/14/2024 1.04  0.61 - 1.24 mg/dL Final   Calcium 98/76/7973 9.3  8.9 - 10.3 mg/dL Final   Total Protein 98/76/7973 7.9  6.5 - 8.1 g/dL Final   Albumin 98/76/7973  4.1  3.5 - 5.0 g/dL Final   AST 98/76/7973 42 (H)  15 - 41 U/L Final   ALT 05/14/2024 50 (H)  0 - 44 U/L Final   Alkaline Phosphatase 05/14/2024 72  38 - 126 U/L Final   Total Bilirubin 05/14/2024 0.3  0.0 - 1.2 mg/dL Final   GFR, Estimated 05/14/2024 >60  >60 mL/min Final   Anion gap 05/14/2024 13  5 - 15 Final   Hgb A1c MFr Bld 05/14/2024 5.8 (H)  4.8 - 5.6 % Final   Mean Plasma Glucose 05/14/2024 119.76  mg/dL Final   Magnesium  05/14/2024 1.7  1.7 - 2.4 mg/dL Final   Alcohol, Ethyl (B) 05/14/2024 <15  <15 mg/dL Final   Cholesterol 98/76/7973 140  0 - 200 mg/dL Final   Triglycerides 98/76/7973 182 (H)  <150 mg/dL Final   HDL 98/76/7973 40 (L)  >40 mg/dL Final   Total CHOL/HDL Ratio 05/14/2024 3.5  RATIO Final   VLDL 05/14/2024 36  0 - 40 mg/dL Final   LDL Cholesterol 05/14/2024 63  0 - 99 mg/dL Final   TSH  98/76/7973 2.080  0.350 - 4.500 uIU/mL Final   Color, Urine 05/14/2024 YELLOW  YELLOW Final   APPearance 05/14/2024 HAZY (A)  CLEAR Final   Specific Gravity, Urine 05/14/2024 1.020  1.005 - 1.030 Final   pH 05/14/2024 6.0  5.0 - 8.0 Final   Glucose, UA 05/14/2024 NEGATIVE  NEGATIVE mg/dL Final   Hgb urine dipstick 05/14/2024 NEGATIVE  NEGATIVE Final   Bilirubin Urine 05/14/2024 NEGATIVE  NEGATIVE Final   Ketones, ur 05/14/2024 NEGATIVE  NEGATIVE mg/dL Final   Protein, ur 98/76/7973 NEGATIVE  NEGATIVE mg/dL Final   Nitrite 98/76/7973 NEGATIVE  NEGATIVE Final   Leukocytes,Ua 05/14/2024 NEGATIVE  NEGATIVE Final   POC Amphetamine UR 05/14/2024 None Detected  NONE DETECTED (Cut Off Level 1000 ng/mL) Final   POC Secobarbital (BAR) 05/14/2024 None Detected  NONE DETECTED (Cut Off Level 300 ng/mL) Final   POC Buprenorphine (BUP) 05/14/2024 None Detected  NONE DETECTED (Cut Off Level 10 ng/mL) Final   POC Oxazepam (BZO) 05/14/2024 None Detected  NONE DETECTED (Cut Off Level 300 ng/mL) Final   POC Cocaine UR 05/14/2024 None Detected  NONE DETECTED (Cut Off Level 300 ng/mL) Final   POC Methamphetamine UR 05/14/2024 None Detected  NONE DETECTED (Cut Off Level 1000 ng/mL) Final   POC Morphine 05/14/2024 None Detected  NONE DETECTED (Cut Off Level 300 ng/mL) Final   POC Methadone UR 05/14/2024 None Detected  NONE DETECTED (Cut Off Level 300 ng/mL) Final   POC Oxycodone  UR 05/14/2024 None Detected  NONE DETECTED (Cut Off Level 100 ng/mL) Final   POC Marijuana UR 05/14/2024 None Detected  NONE DETECTED (Cut Off Level 50 ng/mL) Final     Vitals: Blood pressure 125/79, pulse 99, temperature 98 F (36.7 C), temperature source Oral, resp. rate 20, height 5' 7.5 (1.715 m), weight (!) 161 kg, SpO2 96%.    Odis Cleveland MD PGY-2, Psychiatry Residency  05/17/2024, 8:16 AM

## 2024-05-17 NOTE — Group Note (Signed)
 Date:  05/17/2024 Time:  10:24 AM  Group Topic/Focus:  Goals Group:   The focus of this group is to help patients establish daily goals to achieve during treatment and discuss how the patient can incorporate goal setting into their daily lives to aide in recovery.    Participation Level:  Did Not Attend   Noah Ramsey 05/17/2024, 10:24 AM

## 2024-05-17 NOTE — Discharge Instructions (Addendum)
 Activity: as tolerated  Diet: heart healthy  -Follow-up with your outpatient psychiatric provider -instructions on appointment date, time, and address (location) are provided to you in discharge paperwork.  -Take your psychiatric medications as prescribed at discharge - instructions are provided to you in the discharge paperwork  -Follow-up with outpatient primary care doctor and other specialists -for management of chronic medical disease, including: Pre-diabetes   -Testing: Follow-up with outpatient provider for abnormal lab results: High triglycerides (182), High A1c (5.8%), slightly elevated liver enzymes (AST, ALT)   -Recommend abstinence from alcohol, tobacco, and other illicit drug use at discharge.   -If your psychiatric symptoms recur, worsen, or if you have side effects to your psychiatric medications, call your outpatient psychiatric provider, 911, [phone] or go to the nearest emergency department.  -If suicidal thoughts recur, call your outpatient psychiatric provider, 911, [phone] or go to the nearest emergency department.

## 2024-05-18 MED ORDER — PROPRANOLOL HCL 10 MG PO TABS: 10.0000 mg | ORAL_TABLET | Freq: Two times a day (BID) | ORAL | 0 refills | Status: AC

## 2024-05-18 MED ORDER — HYDROXYZINE HCL 50 MG PO TABS: 50.0000 mg | ORAL_TABLET | Freq: Three times a day (TID) | ORAL | 0 refills | Status: AC | PRN

## 2024-05-18 MED ORDER — TRAZODONE HCL 100 MG PO TABS: 100.0000 mg | ORAL_TABLET | Freq: Every evening | ORAL | 0 refills | Status: AC | PRN

## 2024-05-18 MED ORDER — ACETAMINOPHEN 325 MG PO TABS: 650.0000 mg | ORAL_TABLET | Freq: Four times a day (QID) | ORAL | Status: AC | PRN

## 2024-05-18 NOTE — Transportation (Signed)
 05/18/2024  Noah Ramsey DOB: 02-25-1996 MRN: [id number]   RIDER WAIVER AND RELEASE OF LIABILITY  For the purposes of helping with transportation needs, [facility] partners with outside transportation providers (taxi companies, [place], catering manager.) to give [facility] patients or other approved people the choice of on-demand rides Public Librarian) to our buildings for non-emergency visits.  By using [employer], I, the person signing this document, on behalf of myself and/or any legal minors (in my care using the [employer]), agree:  Science Writer given to me are supplied by independent, outside transportation providers who do not work for, or have any affiliation with, [employer]. [facility] is not a transportation company. [facility] has no control over the quality or safety of the rides I get using [employer]. [facility] has no control over whether any outside ride will happen on time or not. [facility] gives no guarantee on the reliability, quality, safety, or availability on any rides, or that no mistakes will happen. I know and accept that traveling by vehicle (car, truck, SVU, fleeta, bus, taxi, etc.) has risks of serious injuries such as disability, being paralyzed, and death. I know and agree the risk of using [employer] is mine alone, and not [employer]. [employer] are provided as is and as are available. The transportation providers are in charge for all inspections and care of the vehicles used to provide these rides. I agree not to take legal action against [facility], its agents, employees, officers, directors, representatives, insurers, attorneys, assigns, successors, subsidiaries, and affiliates at any time for any reasons related directly or indirectly to using [employer]. I also agree not to take legal action against [facility] or its affiliates for any injury, death, or damage to property caused by or related to using  [employer]. I have read this Waiver and Release of Liability, and I understand the terms used in it and their legal meaning. This Waiver is freely and voluntarily given with the understanding that my right (or any legal minors) to legal action against  relating to [employer] is knowingly given up to use these services.   I attest that I read the Ride Waiver and Release of Liability to Noah Ramsey, gave Mr. Giebel the opportunity to ask questions and answered the questions asked (if any). I affirm that Noah Ramsey then provided consent for assistance with transportation.

## 2024-05-18 NOTE — Group Note (Signed)
 Date:  05/18/2024 Time:  10:19 AM  Group Topic/Focus: Pet Therapy  Self Care:   The focus of this group is to help patients understand the importance of self-care in order to improve or restore emotional, physical, spiritual, interpersonal, and financial health.    Participation Level:  Active  Noah Ramsey HERO [name] Troop 05/18/2024, 10:19 AM

## 2024-05-18 NOTE — Progress Notes (Signed)
" °  [facility] Adult Case Management Discharge Plan :  Will you be returning to the same living situation after discharge:  Yes,  patient will be returning to mother's home.  At discharge, do you have transportation home?: No. CSW arranged taxi voucher through [place] [employer] for 10:30 am. Do you have the ability to pay for your medications: Yes,  patient has active health insurance.   Release of information consent forms completed and in the chart;  Patient's signature needed at discharge.  Patient to Follow up at:  Follow-up Information     [facility] Follow up on 05/18/2024.   Why: Please call your provider after discharge on 05/18/24 at 2:00 pm to arrange for ACTT services. Contact information: [address] [place] [address] [phone]                 Next level of care provider has access to [facility] Link:no  Safety Planning and Suicide Prevention discussed: Yes,  completed with Christophr Calix (mother) [phone].      Has patient been referred to the Quitline?: Patient does not use tobacco/nicotine  products  Patient has been referred for addiction treatment: No known substance use disorder.  Louetta Lame, LCSWA 05/18/2024, 8:57 AM "

## 2024-05-18 NOTE — Group Note (Signed)
 Date:  05/18/2024 Time:  9:45 AM  Group Topic/Focus: Goals and orientation Goals Group:   The focus of this group is to help patients establish daily goals to achieve during treatment and discuss how the patient can incorporate goal setting into their daily lives to aide in recovery. Orientation:   The focus of this group is to educate the patient on the purpose and policies of crisis stabilization and provide a format to answer questions about their admission.  The group details unit policies and expectations of patients while admitted.    Participation Level:  Active  Participation Quality:  Appropriate  Affect:  Appropriate  Cognitive:  Alert  Insight: Appropriate  Engagement in Group:  Engaged  Modes of Intervention:  Discussion  Additional Comments:    Dolores CHRISTELLA Fredericks 05/18/2024, 9:45 AM

## 2024-05-18 NOTE — BH Assessment (Signed)
 Discharge Note.  Pt given AVS, Transition Report. Suicide Risk assessment,  Safety plan, follow up instructions and all belongings from locker 40.  Pt continues to deny SI, HI or AVH at time of discharge.  He states that he has medication at home and will take them when he gets home.  He also stated that his vistaril  will be home delivered today.  Pt escorted to lobby and left via taxi voucher without incident.

## 2024-05-19 NOTE — Telephone Encounter (Signed)
PA approved in other encounter.
